# Patient Record
Sex: Female | Born: 1945 | Race: White | Hispanic: No | Marital: Married | Smoking: Former smoker
Health system: Southern US, Community
[De-identification: ages and names within clinical notes are randomized; demographics above are authoritative.]

## PROBLEM LIST (undated history)

## (undated) DIAGNOSIS — D071 Carcinoma in situ of vulva: Secondary | ICD-10-CM

## (undated) DIAGNOSIS — Z923 Personal history of irradiation: Secondary | ICD-10-CM

## (undated) DIAGNOSIS — C3410 Malignant neoplasm of upper lobe, unspecified bronchus or lung: Secondary | ICD-10-CM

## (undated) DIAGNOSIS — E039 Hypothyroidism, unspecified: Secondary | ICD-10-CM

## (undated) DIAGNOSIS — G629 Polyneuropathy, unspecified: Secondary | ICD-10-CM

## (undated) DIAGNOSIS — K589 Irritable bowel syndrome without diarrhea: Secondary | ICD-10-CM

## (undated) DIAGNOSIS — J301 Allergic rhinitis due to pollen: Secondary | ICD-10-CM

## (undated) DIAGNOSIS — K219 Gastro-esophageal reflux disease without esophagitis: Secondary | ICD-10-CM

## (undated) DIAGNOSIS — F419 Anxiety disorder, unspecified: Secondary | ICD-10-CM

## (undated) DIAGNOSIS — C9590 Leukemia, unspecified not having achieved remission: Secondary | ICD-10-CM

## (undated) DIAGNOSIS — R0989 Other specified symptoms and signs involving the circulatory and respiratory systems: Secondary | ICD-10-CM

## (undated) DIAGNOSIS — R569 Unspecified convulsions: Secondary | ICD-10-CM

## (undated) DIAGNOSIS — C539 Malignant neoplasm of cervix uteri, unspecified: Secondary | ICD-10-CM

## (undated) DIAGNOSIS — J40 Bronchitis, not specified as acute or chronic: Secondary | ICD-10-CM

## (undated) DIAGNOSIS — Z8719 Personal history of other diseases of the digestive system: Secondary | ICD-10-CM

## (undated) DIAGNOSIS — J449 Chronic obstructive pulmonary disease, unspecified: Secondary | ICD-10-CM

## (undated) DIAGNOSIS — J189 Pneumonia, unspecified organism: Secondary | ICD-10-CM

## (undated) DIAGNOSIS — R0602 Shortness of breath: Secondary | ICD-10-CM

## (undated) DIAGNOSIS — F40240 Claustrophobia: Secondary | ICD-10-CM

## (undated) HISTORY — DX: Personal history of irradiation: Z92.3

## (undated) HISTORY — DX: Carcinoma in situ of vulva: D07.1

## (undated) HISTORY — DX: Gastro-esophageal reflux disease without esophagitis: K21.9

## (undated) HISTORY — DX: Malignant neoplasm of cervix uteri, unspecified: C53.9

## (undated) HISTORY — PX: PORTACATH PLACEMENT: SHX2246

## (undated) HISTORY — DX: Hypothyroidism, unspecified: E03.9

## (undated) HISTORY — PX: TUBAL LIGATION: SHX77

## (undated) HISTORY — PX: COLONOSCOPY W/ BIOPSIES AND POLYPECTOMY: SHX1376

---

## 1998-09-06 ENCOUNTER — Encounter (INDEPENDENT_AMBULATORY_CARE_PROVIDER_SITE_OTHER): Payer: Self-pay

## 1998-09-06 ENCOUNTER — Other Ambulatory Visit: Admission: RE | Admit: 1998-09-06 | Discharge: 1998-09-06 | Payer: Self-pay | Admitting: Gynecology

## 1998-09-11 ENCOUNTER — Encounter: Payer: Self-pay | Admitting: Gynecology

## 1998-09-11 ENCOUNTER — Ambulatory Visit (HOSPITAL_COMMUNITY): Admission: RE | Admit: 1998-09-11 | Discharge: 1998-09-11 | Payer: Self-pay | Admitting: Gynecology

## 1998-09-12 ENCOUNTER — Ambulatory Visit: Admission: RE | Admit: 1998-09-12 | Discharge: 1998-09-12 | Payer: Self-pay | Admitting: Gynecologic Oncology

## 1998-09-15 ENCOUNTER — Encounter: Payer: Self-pay | Admitting: Gynecology

## 1998-09-15 ENCOUNTER — Ambulatory Visit (HOSPITAL_COMMUNITY): Admission: RE | Admit: 1998-09-15 | Discharge: 1998-09-15 | Payer: Self-pay | Admitting: Gynecology

## 1998-09-25 ENCOUNTER — Encounter: Admission: RE | Admit: 1998-09-25 | Discharge: 1998-12-24 | Payer: Self-pay | Admitting: Radiation Oncology

## 1998-11-14 ENCOUNTER — Inpatient Hospital Stay (HOSPITAL_COMMUNITY): Admission: RE | Admit: 1998-11-14 | Discharge: 1998-11-17 | Payer: Self-pay | Admitting: Radiation Oncology

## 1999-03-20 ENCOUNTER — Encounter (INDEPENDENT_AMBULATORY_CARE_PROVIDER_SITE_OTHER): Payer: Self-pay | Admitting: Specialist

## 1999-03-20 ENCOUNTER — Other Ambulatory Visit: Admission: RE | Admit: 1999-03-20 | Discharge: 1999-03-20 | Payer: Self-pay | Admitting: Gynecologic Oncology

## 1999-03-20 ENCOUNTER — Ambulatory Visit: Admission: RE | Admit: 1999-03-20 | Discharge: 1999-03-20 | Payer: Self-pay | Admitting: Gynecologic Oncology

## 1999-06-19 ENCOUNTER — Ambulatory Visit (HOSPITAL_COMMUNITY): Admission: RE | Admit: 1999-06-19 | Discharge: 1999-06-19 | Payer: Self-pay | Admitting: Radiation Oncology

## 1999-08-22 ENCOUNTER — Ambulatory Visit: Admission: RE | Admit: 1999-08-22 | Discharge: 1999-08-22 | Payer: Self-pay | Admitting: Gynecologic Oncology

## 1999-08-25 ENCOUNTER — Encounter: Payer: Self-pay | Admitting: Gynecologic Oncology

## 1999-08-25 ENCOUNTER — Ambulatory Visit (HOSPITAL_COMMUNITY): Admission: RE | Admit: 1999-08-25 | Discharge: 1999-08-25 | Payer: Self-pay | Admitting: Gynecologic Oncology

## 1999-10-12 ENCOUNTER — Ambulatory Visit (HOSPITAL_COMMUNITY): Admission: RE | Admit: 1999-10-12 | Discharge: 1999-10-12 | Payer: Self-pay | Admitting: Family Medicine

## 1999-10-25 ENCOUNTER — Encounter: Payer: Self-pay | Admitting: Family Medicine

## 1999-10-25 ENCOUNTER — Encounter: Admission: RE | Admit: 1999-10-25 | Discharge: 1999-10-25 | Payer: Self-pay | Admitting: Family Medicine

## 1999-10-27 DIAGNOSIS — C539 Malignant neoplasm of cervix uteri, unspecified: Secondary | ICD-10-CM

## 1999-10-27 DIAGNOSIS — Z923 Personal history of irradiation: Secondary | ICD-10-CM

## 1999-10-27 HISTORY — DX: Personal history of irradiation: Z92.3

## 1999-10-27 HISTORY — DX: Malignant neoplasm of cervix uteri, unspecified: C53.9

## 1999-11-27 ENCOUNTER — Encounter: Payer: Self-pay | Admitting: Family Medicine

## 1999-11-27 ENCOUNTER — Encounter: Admission: RE | Admit: 1999-11-27 | Discharge: 1999-11-27 | Payer: Self-pay | Admitting: Family Medicine

## 1999-12-25 ENCOUNTER — Ambulatory Visit: Admission: RE | Admit: 1999-12-25 | Discharge: 1999-12-25 | Payer: Self-pay | Admitting: Radiation Oncology

## 1999-12-25 ENCOUNTER — Encounter (INDEPENDENT_AMBULATORY_CARE_PROVIDER_SITE_OTHER): Payer: Self-pay | Admitting: *Deleted

## 2000-06-10 ENCOUNTER — Ambulatory Visit: Admission: RE | Admit: 2000-06-10 | Discharge: 2000-06-10 | Payer: Self-pay | Admitting: Gynecologic Oncology

## 2000-06-10 ENCOUNTER — Other Ambulatory Visit: Admission: RE | Admit: 2000-06-10 | Discharge: 2000-06-10 | Payer: Self-pay | Admitting: Gynecologic Oncology

## 2000-12-23 ENCOUNTER — Other Ambulatory Visit: Admission: RE | Admit: 2000-12-23 | Discharge: 2000-12-23 | Payer: Self-pay | Admitting: Gynecologic Oncology

## 2001-06-02 ENCOUNTER — Ambulatory Visit: Admission: RE | Admit: 2001-06-02 | Discharge: 2001-06-02 | Payer: Self-pay | Admitting: Gynecologic Oncology

## 2001-06-02 ENCOUNTER — Other Ambulatory Visit: Admission: RE | Admit: 2001-06-02 | Discharge: 2001-06-02 | Payer: Self-pay | Admitting: Gynecologic Oncology

## 2001-06-02 ENCOUNTER — Encounter (INDEPENDENT_AMBULATORY_CARE_PROVIDER_SITE_OTHER): Payer: Self-pay | Admitting: Specialist

## 2001-12-22 ENCOUNTER — Ambulatory Visit: Admission: RE | Admit: 2001-12-22 | Discharge: 2001-12-22 | Payer: Self-pay | Admitting: Radiation Oncology

## 2002-01-05 ENCOUNTER — Other Ambulatory Visit: Admission: RE | Admit: 2002-01-05 | Discharge: 2002-01-05 | Payer: Self-pay | Admitting: Radiation Oncology

## 2002-01-05 ENCOUNTER — Ambulatory Visit: Admission: RE | Admit: 2002-01-05 | Discharge: 2002-01-05 | Payer: Self-pay | Admitting: Radiation Oncology

## 2002-01-12 ENCOUNTER — Encounter: Payer: Self-pay | Admitting: Radiation Oncology

## 2002-01-12 ENCOUNTER — Ambulatory Visit (HOSPITAL_COMMUNITY): Admission: RE | Admit: 2002-01-12 | Discharge: 2002-01-12 | Payer: Self-pay | Admitting: Radiation Oncology

## 2002-01-13 ENCOUNTER — Encounter: Payer: Self-pay | Admitting: Radiation Oncology

## 2002-01-13 ENCOUNTER — Ambulatory Visit (HOSPITAL_COMMUNITY): Admission: RE | Admit: 2002-01-13 | Discharge: 2002-01-13 | Payer: Self-pay | Admitting: Radiation Oncology

## 2002-03-19 ENCOUNTER — Encounter: Admission: RE | Admit: 2002-03-19 | Discharge: 2002-06-17 | Payer: Self-pay

## 2002-07-20 ENCOUNTER — Other Ambulatory Visit: Admission: RE | Admit: 2002-07-20 | Discharge: 2002-07-20 | Payer: Self-pay | Admitting: Family Medicine

## 2003-01-11 ENCOUNTER — Other Ambulatory Visit: Admission: RE | Admit: 2003-01-11 | Discharge: 2003-01-11 | Payer: Self-pay | Admitting: Radiation Oncology

## 2003-01-11 ENCOUNTER — Ambulatory Visit: Admission: RE | Admit: 2003-01-11 | Discharge: 2003-01-11 | Payer: Self-pay | Admitting: Radiation Oncology

## 2003-04-29 ENCOUNTER — Encounter: Admission: RE | Admit: 2003-04-29 | Discharge: 2003-04-29 | Payer: Self-pay | Admitting: General Surgery

## 2003-05-02 ENCOUNTER — Ambulatory Visit (HOSPITAL_COMMUNITY): Admission: RE | Admit: 2003-05-02 | Discharge: 2003-05-02 | Payer: Self-pay | Admitting: General Surgery

## 2003-05-02 ENCOUNTER — Encounter (INDEPENDENT_AMBULATORY_CARE_PROVIDER_SITE_OTHER): Payer: Self-pay | Admitting: *Deleted

## 2003-05-02 ENCOUNTER — Ambulatory Visit (HOSPITAL_BASED_OUTPATIENT_CLINIC_OR_DEPARTMENT_OTHER): Admission: RE | Admit: 2003-05-02 | Discharge: 2003-05-02 | Payer: Self-pay | Admitting: General Surgery

## 2003-06-14 ENCOUNTER — Ambulatory Visit: Admission: RE | Admit: 2003-06-14 | Discharge: 2003-06-14 | Payer: Self-pay | Admitting: Gynecologic Oncology

## 2003-06-14 ENCOUNTER — Encounter (INDEPENDENT_AMBULATORY_CARE_PROVIDER_SITE_OTHER): Payer: Self-pay | Admitting: *Deleted

## 2003-06-14 ENCOUNTER — Other Ambulatory Visit: Admission: RE | Admit: 2003-06-14 | Discharge: 2003-06-14 | Payer: Self-pay | Admitting: Gynecologic Oncology

## 2003-07-07 ENCOUNTER — Encounter: Admission: RE | Admit: 2003-07-07 | Discharge: 2003-07-07 | Payer: Self-pay | Admitting: Family Medicine

## 2004-01-17 ENCOUNTER — Other Ambulatory Visit: Admission: RE | Admit: 2004-01-17 | Discharge: 2004-01-17 | Payer: Self-pay | Admitting: Radiation Oncology

## 2004-01-17 ENCOUNTER — Ambulatory Visit: Admission: RE | Admit: 2004-01-17 | Discharge: 2004-01-17 | Payer: Self-pay | Admitting: Radiation Oncology

## 2004-01-24 ENCOUNTER — Ambulatory Visit (HOSPITAL_COMMUNITY): Admission: RE | Admit: 2004-01-24 | Discharge: 2004-01-24 | Payer: Self-pay | Admitting: Gastroenterology

## 2004-01-24 ENCOUNTER — Encounter (INDEPENDENT_AMBULATORY_CARE_PROVIDER_SITE_OTHER): Payer: Self-pay | Admitting: *Deleted

## 2004-03-14 ENCOUNTER — Encounter: Admission: RE | Admit: 2004-03-14 | Discharge: 2004-04-25 | Payer: Self-pay | Admitting: Family Medicine

## 2004-04-25 DIAGNOSIS — D071 Carcinoma in situ of vulva: Secondary | ICD-10-CM

## 2004-04-25 HISTORY — PX: LASER ABLATION: SHX1947

## 2004-04-25 HISTORY — DX: Carcinoma in situ of vulva: D07.1

## 2004-05-01 ENCOUNTER — Other Ambulatory Visit: Admission: RE | Admit: 2004-05-01 | Discharge: 2004-05-01 | Payer: Self-pay | Admitting: Gynecologic Oncology

## 2004-05-01 ENCOUNTER — Ambulatory Visit: Admission: RE | Admit: 2004-05-01 | Discharge: 2004-05-01 | Payer: Self-pay | Admitting: Gynecologic Oncology

## 2004-05-01 ENCOUNTER — Encounter (INDEPENDENT_AMBULATORY_CARE_PROVIDER_SITE_OTHER): Payer: Self-pay | Admitting: Specialist

## 2004-05-01 ENCOUNTER — Encounter (INDEPENDENT_AMBULATORY_CARE_PROVIDER_SITE_OTHER): Payer: Self-pay | Admitting: *Deleted

## 2004-06-16 ENCOUNTER — Emergency Department (HOSPITAL_COMMUNITY): Admission: EM | Admit: 2004-06-16 | Discharge: 2004-06-17 | Payer: Self-pay | Admitting: Emergency Medicine

## 2004-06-19 ENCOUNTER — Ambulatory Visit (HOSPITAL_COMMUNITY): Admission: RE | Admit: 2004-06-19 | Discharge: 2004-06-19 | Payer: Self-pay | Admitting: Gynecologic Oncology

## 2004-07-10 ENCOUNTER — Ambulatory Visit: Admission: RE | Admit: 2004-07-10 | Discharge: 2004-07-10 | Payer: Self-pay | Admitting: Gynecologic Oncology

## 2004-10-12 ENCOUNTER — Encounter: Admission: RE | Admit: 2004-10-12 | Discharge: 2004-10-12 | Payer: Self-pay | Admitting: Family Medicine

## 2004-10-23 ENCOUNTER — Encounter (INDEPENDENT_AMBULATORY_CARE_PROVIDER_SITE_OTHER): Payer: Self-pay | Admitting: *Deleted

## 2004-10-23 ENCOUNTER — Ambulatory Visit: Admission: RE | Admit: 2004-10-23 | Discharge: 2004-10-23 | Payer: Self-pay | Admitting: Gynecologic Oncology

## 2004-10-23 ENCOUNTER — Other Ambulatory Visit: Admission: RE | Admit: 2004-10-23 | Discharge: 2004-10-23 | Payer: Self-pay | Admitting: Gynecologic Oncology

## 2005-03-13 ENCOUNTER — Emergency Department (HOSPITAL_COMMUNITY): Admission: EM | Admit: 2005-03-13 | Discharge: 2005-03-13 | Payer: Self-pay | Admitting: Emergency Medicine

## 2005-07-31 ENCOUNTER — Other Ambulatory Visit: Admission: RE | Admit: 2005-07-31 | Discharge: 2005-07-31 | Payer: Self-pay | Admitting: Gynecologic Oncology

## 2005-07-31 ENCOUNTER — Encounter (INDEPENDENT_AMBULATORY_CARE_PROVIDER_SITE_OTHER): Payer: Self-pay | Admitting: *Deleted

## 2005-07-31 ENCOUNTER — Ambulatory Visit: Admission: RE | Admit: 2005-07-31 | Discharge: 2005-07-31 | Payer: Self-pay | Admitting: Gynecologic Oncology

## 2006-08-13 ENCOUNTER — Ambulatory Visit: Admission: RE | Admit: 2006-08-13 | Discharge: 2006-08-13 | Payer: Self-pay | Admitting: Gynecologic Oncology

## 2006-08-13 ENCOUNTER — Other Ambulatory Visit: Admission: RE | Admit: 2006-08-13 | Discharge: 2006-08-13 | Payer: Self-pay | Admitting: Gynecologic Oncology

## 2006-08-13 ENCOUNTER — Encounter (INDEPENDENT_AMBULATORY_CARE_PROVIDER_SITE_OTHER): Payer: Self-pay | Admitting: Gynecologic Oncology

## 2007-04-06 ENCOUNTER — Encounter: Admission: RE | Admit: 2007-04-06 | Discharge: 2007-04-06 | Payer: Self-pay | Admitting: Family Medicine

## 2007-05-25 ENCOUNTER — Emergency Department (HOSPITAL_COMMUNITY): Admission: EM | Admit: 2007-05-25 | Discharge: 2007-05-25 | Payer: Self-pay | Admitting: Emergency Medicine

## 2007-07-14 ENCOUNTER — Ambulatory Visit: Admission: RE | Admit: 2007-07-14 | Discharge: 2007-07-14 | Payer: Self-pay | Admitting: Gynecologic Oncology

## 2007-07-14 ENCOUNTER — Other Ambulatory Visit: Admission: RE | Admit: 2007-07-14 | Discharge: 2007-07-14 | Payer: Self-pay | Admitting: Gynecologic Oncology

## 2007-07-14 ENCOUNTER — Encounter (INDEPENDENT_AMBULATORY_CARE_PROVIDER_SITE_OTHER): Payer: Self-pay | Admitting: Gynecologic Oncology

## 2008-06-29 ENCOUNTER — Ambulatory Visit: Admission: RE | Admit: 2008-06-29 | Discharge: 2008-06-29 | Payer: Self-pay | Admitting: Gynecologic Oncology

## 2008-06-29 ENCOUNTER — Encounter (INDEPENDENT_AMBULATORY_CARE_PROVIDER_SITE_OTHER): Payer: Self-pay | Admitting: Gynecologic Oncology

## 2008-06-29 ENCOUNTER — Other Ambulatory Visit: Admission: RE | Admit: 2008-06-29 | Discharge: 2008-06-29 | Payer: Self-pay | Admitting: Gynecologic Oncology

## 2009-08-02 ENCOUNTER — Ambulatory Visit: Admission: RE | Admit: 2009-08-02 | Discharge: 2009-08-02 | Payer: Self-pay | Admitting: Gynecologic Oncology

## 2009-08-02 ENCOUNTER — Other Ambulatory Visit: Admission: RE | Admit: 2009-08-02 | Discharge: 2009-08-02 | Payer: Self-pay | Admitting: Gynecologic Oncology

## 2010-06-03 ENCOUNTER — Emergency Department (HOSPITAL_COMMUNITY): Payer: Medicare Other

## 2010-06-03 ENCOUNTER — Emergency Department (HOSPITAL_COMMUNITY)
Admission: EM | Admit: 2010-06-03 | Discharge: 2010-06-03 | Disposition: A | Discharge status: Home / Self Care | Payer: Medicare Other | Attending: Emergency Medicine | Admitting: Emergency Medicine

## 2010-06-03 DIAGNOSIS — K219 Gastro-esophageal reflux disease without esophagitis: Secondary | ICD-10-CM | POA: Insufficient documentation

## 2010-06-03 DIAGNOSIS — E039 Hypothyroidism, unspecified: Secondary | ICD-10-CM | POA: Insufficient documentation

## 2010-06-03 DIAGNOSIS — R079 Chest pain, unspecified: Secondary | ICD-10-CM | POA: Insufficient documentation

## 2010-06-03 DIAGNOSIS — E7800 Pure hypercholesterolemia, unspecified: Secondary | ICD-10-CM | POA: Insufficient documentation

## 2010-06-03 DIAGNOSIS — J4489 Other specified chronic obstructive pulmonary disease: Secondary | ICD-10-CM | POA: Insufficient documentation

## 2010-06-03 DIAGNOSIS — J449 Chronic obstructive pulmonary disease, unspecified: Secondary | ICD-10-CM | POA: Insufficient documentation

## 2010-06-03 DIAGNOSIS — Z79899 Other long term (current) drug therapy: Secondary | ICD-10-CM | POA: Insufficient documentation

## 2010-06-03 LAB — DIFFERENTIAL
Basophils Absolute: 0 10*3/uL (ref 0.0–0.1)
Basophils Relative: 0 % (ref 0–1)
Eosinophils Absolute: 0.2 10*3/uL (ref 0.0–0.7)
Eosinophils Relative: 3 % (ref 0–5)

## 2010-06-03 LAB — BASIC METABOLIC PANEL
BUN: 10 mg/dL (ref 6–23)
Calcium: 8.8 mg/dL (ref 8.4–10.5)
GFR calc non Af Amer: >60 mL/min (ref >60)
Glucose, Bld: 92 mg/dL (ref 70–99)
Sodium: 138 mEq/L (ref 135–145)

## 2010-06-03 LAB — CBC
Platelets: 270 10*3/uL (ref 150–400)
RDW: 14.2 % (ref 11.5–15.5)
WBC: 7 10*3/uL (ref 4.0–10.5)

## 2010-06-03 LAB — POCT CARDIAC MARKERS
CKMB, poc: 10.2 ng/mL (ref 1.0–8.0)
Myoglobin, poc: 148 ng/mL (ref 12–200)

## 2010-07-10 NOTE — Consult Note (Signed)
NAME:  Dawn Mercer, Dawn Mercer             ACCOUNT NO.:  [id number]   MEDICAL RECORD NO.:  [id number]          PATIENT TYPE:  OUT   LOCATION:  GYN                          FACILITY:  [facility]   PHYSICIAN:  John T. Kyla Balzarine, M.D.    DATE OF BIRTH:  05/27/1945   DATE OF CONSULTATION:  06/29/2008  DATE OF DISCHARGE:                                 CONSULTATION   CHIEF COMPLAINT:  Followup of cervical cancer and VIN.   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  Followup evaluation for both have been  normal including last Pap smear 1 year ago.  She is up-to-date on  colonoscopy and has still some symptoms of low grade radiation enteritis  with dietary intolerance of fatty food or some roughage.  She denies  rectal bleeding, vaginal bleeding, pelvic pain, leg swelling or bladder  symptoms.   PAST MEDICAL HISTORY:  1. Hypothyroidism.  2. GERD.  3. Degenerative disk disease.  4. Osteopenia.  5. GYN as above.   MEDICATIONS:  Nexium, Synthroid, Ambien, tramadol and Fosamax.   ALLERGIES:  None known.   PERSONAL AND SOCIAL HISTORY:  Reviewed and unchanged from my intake  evaluation in 2001.   FAMILY HISTORY:  Noncontributory.   REVIEW OF SYSTEMS:  Otherwise 10-point review negative.   EXAMINATION:  VITAL SIGNS:  Stable and afebrile with weight stable at  200 pounds.  The patient is anxious, alert and oriented x3 in no acute  distress.  LYMPH SURVEY:  Negative for pathologic lymphadenopathy.  BACK:  No spinous or CVA tenderness.  ABDOMEN:  Obese, soft and benign with no hernia, ascites, mass or  tenderness.  EXTREMITIES:  Full strength and range of motion without edema, cords or  Homan's.  PELVIC:  External genitalia BUS.  Bladder and  urethra normal.  Vagina is  clear and coapted at the apex.  No palpable lesions.  Bimanual and  rectovaginal examinations reveal no cervical expansion.  The uterus is  poorly defined because of habitus.  There are no masses, nodularity or  tenderness.   ASSESSMENT:  Cervical carcinoma, no evidence of disease.  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   PLAN:  Cytology repeated and will be communicated to the patient.  She  will continue annual followup.  The importance of mammographic screening  was again emphasized.      John T. Kyla Balzarine, M.D.  Electronically Signed     JTS/MEDQ  D:  06/29/2008  T:  06/29/2008  Job:  [id number]   cc:   Telford Nab, R.N.  [address]  [place], [place] [address]   Billie Lade, M.D.  Fax: [phone]   Chales Salmon. Abigail Miyamoto, M.D.  Fax: [phone]

## 2010-07-10 NOTE — Consult Note (Signed)
NAME:  Dawn Mercer, Dawn Mercer             ACCOUNT NO.:  [id number]   MEDICAL RECORD NO.:  [id number]          PATIENT TYPE:  OUT   LOCATION:  GYN                          FACILITY:  [facility]   PHYSICIAN:  John T. Kyla Balzarine, M.D.    DATE OF BIRTH:  May 25, 1945   DATE OF CONSULTATION:  08/13/2006  DATE OF DISCHARGE:                                 CONSULTATION   CHIEF COMPLAINT:  Follow-up of VIN-3 and cervical cancer.   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  She had no evidence of  recurrent disease.  In March 2006, she had VIN-3, undergoing extensive  laser ablation.  She was treated with Aldara.  Follow-up has been  negative including evaluation by Dr. Roselind Messier.  The patient has done quite  well but does have symptoms of radiation enteritis including dietary  intolerance of roughage.  She is currently on ProctoFoam for rectal  bleeding.  She denies bladder symptoms, vaginal bleeding or leg  swelling.   PAST MEDICAL HISTORY:  1. Hypothyroidism.  2. GERD.  3. Degenerative disk disease.  4. Osteopenia.   MEDICATIONS:  1. Nexium.  2. Synthroid.  3. Ambien.  4. Tramadol.  5. Fosamax.   ALLERGIES:  None known.   PERSONAL SOCIAL HISTORY AND FAMILY HISTORY:  Are reviewed and unchanged  from those obtained at intake in 2001.   REVIEW OF SYSTEMS:  Otherwise, 10-point review negative.   PHYSICAL EXAMINATION:  Weight 199 pounds.  The patient is alert and  oriented x3 in no acute distress.  LYMPH SURVEY:  Negative for pathologic lymphadenopathy.  The abdomen is obese, soft and benign with no ascites, mass or  tenderness.  EXTREMITIES:  Have full strength and range of motion without edema,  clubbing or cyanosis.  PELVIC:  External genitalia, bladder, urethra and vagina are all normal.  Bimanual and rectovaginal examinations disclose small hemorrhoids but no  palpable abnormalities.   ASSESSMENT:  1. Cervical cancer history and VIN post laser vaporization.  2. Low grade radiation enteritis.   PLAN:  The patient was given a prescription for Questran, and I  discussed its use.  The patient can be followed annually, and she will  be appraised of today's Pap smear.      John T. Kyla Balzarine, M.D.  Electronically Signed     JTS/MEDQ  D:  08/13/2006  T:  08/14/2006  Job:  [id number]   cc:   Chales Salmon. Abigail Miyamoto, M.D.  Fax: [phone]   Billie Lade, M.D.  Fax: [phone]   Telford Nab, R.N.  [address]  [place], [place] [address]

## 2010-07-10 NOTE — Consult Note (Signed)
NAME:  Dawn Mercer, Dawn Mercer             ACCOUNT NO.:  [id number]   MEDICAL RECORD NO.:  [id number]         PATIENT TYPE:  LOUT   LOCATION:                               FACILITY:  [facility]   PHYSICIAN:  John T. Kyla Balzarine, M.D.    DATE OF BIRTH:  1945/07/25   DATE OF CONSULTATION:  DATE OF DISCHARGE:                                 CONSULTATION   CHIEF COMPLAINT:  This patient is a 65 year old woman who returns for  followup of VIN III and cervical cancer.   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  She had no evidence of  recurrent disease.  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  Follow-up evaluation has been  normal including my last Pap smear 1 year ago.  She underwent  colonoscopy since she was last seen with removal of a precancerous  polyp.  She has had no further rectal bleeding and denies bladder  symptoms, vaginal bleeding or leg swelling.   PAST MEDICAL HISTORY:  1. Hypothyroidism.  2. GERD.  3. Degenerative disk disease.  4. Osteopenia.   MEDICATIONS:  Nexium, Synthroid, Ambien, tramadol and Fosamax.   ALLERGIES:  None known.   PERSONAL AND SOCIAL HISTORY:  Reviewed and unchanged from those obtained  during intake in 2001.   REVIEW OF SYSTEMS:  Otherwise 10-point review negative.   EXAM:  Weight stable and vital signs stable.  The patient is obese, in  no acute distress.  LYMPH SURVEY:  Negative for pathologic lymphadenopathy.  BACK:  No CVA tenderness.  ABDOMEN:  Soft and benign, with no ascites, mass or organomegaly.  No  abdominal tenderness.  EXTREMITIES:  With full strength and range of motion.  No edema, cords  or Homans'.  PELVIC:  External genitalia:  BUS are normal to inspection and  palpation.  Bladder and urethra are normal.  Speculum examination  reveals radiation change to the  vaginal apex.  The cervix is nonpalpable  and uterus is not enlarged.  There are no masses or tenderness.   ASSESSMENT:  Cervical carcinoma, no evidence of disease, and vulvar  dysplasia.   PLAN:  The patient is reassured regarding her current status.  She  should have follow-up cytology at annual intervals and for each visit a  Pap smear will be obtained.      John T. Kyla Balzarine, M.D.  Electronically Signed     JTS/MEDQ  D:  07/14/2007  T:  07/14/2007  Job:  [id number]

## 2010-07-13 NOTE — Op Note (Signed)
NAME:  Dawn Mercer, Dawn Mercer                       ACCOUNT NO.:  [id number]   MEDICAL RECORD NO.:  [id number]                   PATIENT TYPE:  AMB   LOCATION:  DSC                                  FACILITY:  [facility]   PHYSICIAN:  Gabrielle Dare. Janee Morn, M.D.             DATE OF BIRTH:  21-Apr-1945   DATE OF PROCEDURE:  DATE OF DISCHARGE:                                 OPERATIVE REPORT   PREOPERATIVE DIAGNOSIS:  Anal and perianal condylomata.   POSTOPERATIVE DIAGNOSIS:  Anal and perianal condylomata.   PROCEDURE:  Excision anal and perianal condylomata.   SURGEON:  Gabrielle Dare. Janee Morn, M.D.   ANESTHESIA:  General with laryngeal mask airway.   INDICATIONS:  The patient is a 65 year old white female who I evaluated in  my office for anal and perianal wart, and she presents for elective  excision.  She does have a history of some cervical cancer in the past which  I suspect may have some association.   DESCRIPTION OF PROCEDURE:  Informed consent was obtained.  The patient  received preoperative antibiotics.  General anesthesia was begun.  Laryngeal  mask airway was administered.  She was placed in the lithotomy position.  Her perianal area and lower buttocks were prepped and draped in a sterile  fashion.  First, 0.5% Marcaine with epinephrine was injected in the affected  area.  Digital rectal exam revealed similar findings to my office  examination with several small to moderate size condylomata involving her  anal area.  No other intrarectal abnormalities were noted.  In addition, she  had two approximately 1 cm size conglomerations of warts, one on the left  lateral skin outside her anal opening and then one on the right side of  similar size.  These areas were also anesthetized. Initially using the  scalpel, the anal and intra-anal lesions were excised.  Hemostasis was  obtained with Bovie cautery using the smoke vacuum from each and these were  sent to pathology,  Subsequently, elliptical  incision was made around the  left lateral lesion, and this was sent in toto as a separate specimen.  Hemostasis at that site was obtained with Bovie cautery.  Finally, the right  lateral lesion was excised with Bovie.  This was also sent to pathology,  labeled separately, and this area was cauterized to get excellent  hemostasis.  Subsequently, all of the wounds were copiously irrigated.  They  were rechecked for hemostasis.  This was then secured with the Bovie cautery  and the skin of each closed with interrupted 4-0 Vicryl subcuticular suture.  Benzoin and Steri-Strips were also applied to the right lateral and left  lateral lesions.  The areas were all rechecked of hemostasis which was  intact and sterile dressing was applied.  Sponge, needle and instrument  counts were all correct.  The patient tolerated the procedure well and was  taken to the recovery room in stable condition.  Gabrielle Dare Janee Morn, M.D.    BET/MEDQ  D:  05/02/2003  T:  05/02/2003  Job:  [id number]

## 2010-07-13 NOTE — Consult Note (Signed)
NAME:  Dawn Mercer, Dawn Mercer             ACCOUNT NO.:  [id number]   MEDICAL RECORD NO.:  [id number]          PATIENT TYPE:  OUT   LOCATION:  GYN                          FACILITY:  [facility]   PHYSICIAN:  Paola A. Duard Brady, MD    DATE OF BIRTH:  [birth year]   DATE OF CONSULTATION:  DATE OF DISCHARGE:                                   CONSULTATION   Dawn Mercer is a 65 year old who was diagnosed with IB adenosquamous  carcinoma of the cervix and underwent radiation therapy, which she completed  in September, 2001.  She has been followed without evidence of significant  recurrent disease since that time.  She had a Pap smear in November, 2005,  which revealed atypical squamous cells of undetermined significance.  It was  felt that the atypia was similar to that she had in her prior Pap smear, and  she was dispositioned to Premarin cream 1 gm twice weekly.  She comes in  today for a follow-up Pap smear.  She was last seen by Dr. Roselind Messier in  November, 2005, at which time this Pap smear was performed.  She otherwise  denies any significant complaints.  She has a bone spurring.  She also has  plantar fascitis of her right foot.  She has gained some weight, but she  attributes it to inactivity from her right foot discomfort as well as some  back problems.  She does have hypothyroidism, but per her report, per Dr.  Abigail Miyamoto, her thyroid functions are normal.  She denies any vaginal bleeding  or any change in her bowel or bladder habits.   MEDICATIONS:  Premarin cream, Synthroid, Nexium, Fosamax, transitioning to  Boniva and Tramadol.   PHYSICAL EXAMINATION:  VITAL SIGNS:  Weight 202 pounds.  Blood pressure  127/70.  GENERAL:  A well-developed and well-nourished female in no acute distress.  PELVIC:  External genitalia is notable for an enlarged labia minora on her  left side.  There is hyperkeratosis and discoloration of the left labia  minora.  The patient was shown this area with the mirror.  The  vaginal is  markedly atrophic.  The vaginal cuff is visualized.  There are no gross  visible lesions.  A ThinPrep Pap is submitted without difficulty.   After obtaining the patient's verbal consent, 1% lidocaine 0.5 cc was  injected into the left labia minora.  A biopsy was performed using Tischler  biopsy forceps.  Hemostasis was obtained using silver nitrate.  Bimanual  examination reveals no masses or nodularity.  Rectal was deferred, as she  recently had a rectal examination three months ago.   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   PLAN:  We will follow up the results of her Pap smear and her vulvar biopsy  from today.  After I noticed the lesion and commented about  it, she does  state that for several months, she has had some vulvar pruritus.  She felt  that she  had scratched herself.  We will follow up on these results, and she will  return to see [place] in three months.  We will need to determine her disposition  regarding the vulvar biopsy.  Once the pathology is available, we will  contact her with the results.      PAG/MEDQ  D:  05/01/2004  T:  05/01/2004  Job:  [id number]   cc:   Jonny Ruiz T. Kyla Balzarine, M.D.   Billie Lade, M.D.  [address] [facility]  [place]  [place] [address]  Fax: [phone]   Chales Salmon. Abigail Miyamoto, M.D.  [address]  [place]  [place] [address]  Fax: [phone]   Telford Nab, R.N.  [phone]  [place], [place] [address]

## 2010-07-13 NOTE — Consult Note (Signed)
NAME:  Dawn Mercer, Dawn Mercer NO.:  [id number]   MEDICAL RECORD NO.:  [id number]                   PATIENT TYPE:  REC   LOCATION:  [facility]                                  FACILITY:  [facility]   PHYSICIAN:  Zachary George, DO                      DATE OF BIRTH:  Mar 16, 1945   DATE OF CONSULTATION:  03/30/2002  DATE OF DISCHARGE:                                   CONSULTATION   REASON FOR CONSULTATION:  Thank you very much for kindly referring the  [facility] to [facility] for evaluation.  The patient was seen in our clinic today.  Please refer to the following for  details regarding the history, physical examination, and treatment plan.  Once again, thank you for allowing [place] to participate in the care of the  patient.   CHIEF COMPLAINT:  Lower back, right foot, and leg pain.   HISTORY OF PRESENT ILLNESS:  The patient is a pleasant 65 year old right-  hand dominant female who was kindly referred by Dr. Chales Salmon. Thacker for  evaluation of low back pain.  The patient complains of a several year  history of low back pain radiating across her beltline to her buttocks  bilaterally, and into her right anterior thigh into her knee.  She states  the pain intermittently involves her anterior foot and toes lasting  approximately five minutes causing her to have to sit down.  She denies any  numbness or paresthesias in her lower extremities.  Her function and quality  of life indices have declined and she states that I cannot do my housework  like I used to.  Her sleep is poor.   She has a history of cervical cancer and is status post radiation therapy x  27 treatments and radioactive implants.  She is currently followed by Dr.  Billie Lade.  Because of her past history of cancer, she has undergone  bone scan, which according to Dr. Chales Salmon. Thacker's note was negative.  She also had a MRI of her lumbar spine which revealed some  degenerative disc  changes at L4/5 and L5-S1 with lumbar facet arthropathy at L4/5 and L5-S1  bilaterally.  She had a CT scan of her abdomen and pelvis which was negative  for any metastases.   The patient's current pain level is 4/10 on a subjective scale and described  as constant, dull with associated feeling of weakness in the right lower  extremity.  Her symptoms are worse with walking, bending, sitting, and  improved with frequent position changes.  She has not had any physical  therapy but has been treated primarily with medications including aspirin,  Tylenol, Aleve, Advil, and Celebrex without any relief.  She has also been  prescribed OxyContin which she states she flushed down the toilet secondary  to poor tolerance to the medication.  She is not currently taking any pain  medication.   I reviewed the health and history form, 14-point review of systems.  The  patient denies bowel and bladder dysfunction.  The patient denies fevers,  chills, night sweats, or weight loss.  She admits to occasional shortness of  breath, leg pain with walking, ankle swelling, sinus trouble, thyroid  trouble, history of seizures.   PAST MEDICAL HISTORY:  1. Cervical cancer.  2. Hypothyroidism.  3. Gastroesophageal reflux disease.  4. Chronic obstructive pulmonary disease.  5. Seizure disorder.   PAST SURGICAL HISTORY:  Radiation implants for cervical cancer.   FAMILY HISTORY:  Heart disease, cancer, diabetes.   SOCIAL HISTORY:  The patient denies smoking, alcohol, or illicit drug use.  She is married and is not currently working.  She does not feel like she is  disabled but states that she cannot hold down a job requiring her to sit or  stand for any prolonged period of time.   ALLERGIES:  No known drug allergies.   MEDICATIONS:  Nexium, Synthroid, Fosamax.   PHYSICAL EXAMINATION:  GENERAL:  A healthy-appearing female in no acute  distress.  VITAL SIGNS:  Blood pressure is 141/51,  pulse 67, respirations 18.  O2  saturation is 94% on room air.  NEUROLOGICAL:  Mood and affect are appropriate.  The patient is alert and  oriented.  BACK:  Examination of the back reveals level pelvis without scoliosis.  There is normal lumbar lordosis.  There is significant tenderness to  palpation bilateral lower lumbar paraspinal muscles bilaterally.  Range of  motion of the lumbar spine is full in all planes with pain on extension,  rotation, as well extension equally.  There is minimal pain, if any, with  flexion.  Manual muscle testing is 5/5 bilateral lower extremities.  Sensory  examination is intact to light touch bilateral lower extremities.  Muscle  stretch reflexes are 2+/4 bilateral patella, medial hamstrings, and  Achilles.  Straight leg raise is negative bilaterally.  Pearlean Brownie is negative  bilaterally.  The patient has tight hamstrings and hip flexors bilaterally.  There is no abnormal tone noted in the lower extremities.  No ankle clonus  noted bilaterally.  No heat, erythema, or edema in the lower extremities.   LABORATORY DATA:  MRI of the lumbar spine dated January 13, 2002 reveals  mild disc bulge at L4/5 with bilateral facet joint degenerative changes.  There are also mild bilateral facet joint degenerative changes at L5-S1 with  minimal anterior slippage and mild diffuse pseudodisc bulge.   IMPRESSION:  1. Chronic low back pain with right lower extremity radicular symptoms.  2. Lumbar facet arthropathy.  I suspect that this is contributing     significantly to the patient's current pain symptoms given her physical     exam findings.  3. Degenerative disc disease in the lumbar spine, mild, question clinical     significant.  4. History of cervical cancer.  Based on her imaging studies, there is no     evidence to support metastatic disease at this time.   PLAN:  1. This was an extensive consultation with the patient of 45 minutes    duration.  We spent  considerable time discussing the etiology of her pain     and the treatment options.  We discussed medication management therapy     and minimally invasive interventional procedures to help control her     pain.  Initially, I would like to get her  started in physical therapy for     range of motion stretching, lumbar stabilization exercises, and leading     to a home exercise program two to three times a week for four weeks.  2. In terms of medications, we will start Lidoderm 5% applied up to 12 hours     per day, maximum three patches a time #30 with one refill.  I have asked     her to give this a two week trial before judging efficacy.  If symptoms     are not improving with the Lidoderm, we will consider adding a different     nonsteroidal anti-inflammatory medication such as Bextra, Vioxx, or     Mobic.  3. We will consider facet joint injections bilateral L4/5 and L5-S1     diagnostically and therapeutically.  I discussed this with the patient     including the potential risk, benefits, limitations, and alternatives.     Would like to give physical therapy a try prior to any injections and the     patient agrees.  4. The patient is to return to the clinic in one month for reevaluation.    DISPOSITION:  The patient was educated in the above findings,  recommendations, and understands.  There were no barriers to communication.                                               Zachary George, DO    JW/MEDQ  D:  03/30/2002  T:  03/30/2002  Job:  [id number]   cc:   Chales Salmon. Abigail Miyamoto, M.D.  [address]  [place]  [place] [address]  Fax: [phone]

## 2010-07-13 NOTE — Consult Note (Signed)
[facility]  Patient:    Dawn Mercer, Dawn Mercer Visit Number: [id number] MRN: [id number]          Service Type: GON Location: GYN Attending Physician:  Sabino Donovan Dictated by:   Jackquline Denmark. Kyla Balzarine, M.D. Proc. Date: 06/02/01 Admit Date:  06/02/2001   CC:         Billie Lade, M.D.  Gretta Cool, M.D.  Telford Nab, R.N.   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  INTERVAL HISTORY:  The patient has continued to have progressive bilateral hip pain and will be evaluated by an orthopedist in the near future.  Celebrex did not improve her pain.  She denies CVA tenderness, pelvic pain, or vaginal bleeding.  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  She has since been followed without evidence of recurrent disease.  PAST MEDICAL HISTORY:  Significant for seizure at age 26, hyperthyroidism, and osteoporosis.  MEDICATIONS:  Fosamax and Xanax p.r.n.  ALLERGIES:  CODEINE.  SOCIAL HISTORY:  The patient is married with a 40+-pack-year history of tobacco abuse.  Denies ethanol.  FAMILY HISTORY:  Noncontributory.  REVIEW OF SYSTEMS:  As noted above.  Patients hip pain is severe enough that she uses a cane for ambulation.  PHYSICAL EXAMINATION  VITAL SIGNS:  Stable, afebrile.  Weight 188 pounds.  GENERAL:  The patient is alert and oriented x3, in no acute distress.  NECK:  Supple without goiter.  There is no pathologic lymphadenopathy.  BACK:  No back or CVA tenderness.  LUNGS:  Lung fields are clear.  ABDOMEN:  Obese, soft, benign without ascites, mass, or organomegaly.  EXTREMITIES:  Full range of motion and strength with no significant lymphedema or cellulitis changes.  PELVIC:  External genitalia and BUS  are normal to inspection and palpation. There is good support of the bladder and urethra.  Vagina has radiation change in the upper vault with coaptation.  Bimanual and rectovaginal examinations reveal small, round cervix and uterus without parametrial thickening or nodularity.  ASSESSMENT:  Cervical carcinoma, clinically NED.  PLAN:  Pap smear obtained.  The patient is reassured regarding her current status.  She will see Dr. Roselind Messier for follow-up in six months and return to see [place] in one year.  I definitely recommended she see an orthopedist for her current hip pain. Dictated by:   Jackquline Denmark. Kyla Balzarine, M.D. Attending Physician:  Ronita Hipps T DD:  06/02/01 TD:  06/03/01 Job: [id number]

## 2010-07-13 NOTE — H&P (Signed)
[facility]  Patient:    Dawn Mercer, Dawn Mercer                    MRN: [id number] Adm. Date:  [id number] Attending:  Ronita Hipps T CC:         Telford Nab, N.P. - GYN Oncology Unit - [facility]             Billie Lade, M.D.             Gretta Cool, M.D.                         History and Physical  CHIEF COMPLAINT:  This 65 year old Caucasian woman who returns for ongoing followup is stage IB2 carcinoma of the cervix with adenosquamous histology.  INTERVAL NOTE:  The patient has noted occasional leg swelling, right greater than left, which decreases at the end of the day after she has elevated her legs, but reaccumulates beginning midday.  She has had bilateral hip pain.  Her leg edema  responded to transient use of "fluid pills."  She has normal and bladder symptoms. She discontinue Evista because of forgetfulness and wishes a discussion of hormonal replacement.  HISTORY OF PRESENT ILLNESS:  The patient had stage IB2 adenosquamous carcinoma f the cervix, treated with external beam radiation, followed by an implant with concurrent cisplatin chemotherapy.  She has been followed, without evidence of recurrent disease.  PAST MEDICAL HISTORY:  Significant for a seizure at age 61, treated with Dilantin through the mid-1990s.  No chronic comorbidities.  CURRENT MEDICATIONS:  Xanax 0.5 mg p.r.n.  ALLERGIES:  CODEINE.  PERSONAL/SOCIAL HISTORY:  The patient is married and has a 40+-pack-year-history of tobacco use, but discontinued smoking recently.  There is no history of ethanol use.  FAMILY HISTORY:  Noncontributory.  REVIEW OF SYSTEMS:  As noted above.  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  Leg swelling and bilateral hip pain as above.  At times, the hip pain is severe enough that she uses a cane for ambulation.  She has gained weight since smoking cessation.  PHYSICAL  EXAMINATION:  VITAL SIGNS:  Weight 182 pounds (increased), blood pressure 120/78.  Vital stable and afebrile.  GENERAL:  The patient is alert and oriented x 3, in no acute distress.  HEENT:  Benign and clear oropharynx.  NECK:  Supple without goiter.  There is no pathologic lymphadenopathy.  BACK:  There is no back or CVA tenderness.  LUNGS:  Fields are clear.  ABDOMEN:  Obese, soft, benign, without ascites, mass, or organomegaly.  EXTREMITIES:  Full range of motion with trace right pretibial edema.  No edema n the left.  There are no skin lesions.  GENITOURINARY/PELVIC/RECTAL:  External genitalis and BUS are normal to inspection and palpation.  There is good support of bladder and urethra.  The vagina has radiation change in the upper vault with coaptation, and the cervix is not visualized.  Bimanual and rectovaginal examinations reveal a small cervix and uterus without parametrial thickening or nodularity.  ASSESSMENT: 1. Cervical carcinoma, clinically no evidence of disease. 2. Bilateral hip pain. 3. Lymphedema.  PLAN:  The patient is given prescriptions for Estraderm 0.5 mcg patch b.i.w., and Aldactazide 25 mg to be used on a p.r.n. basis.  We will set up a CT scan of the abdomen and pelvis, and if negative, we can spread out her followup after she sees Dr. Fayrene Fearing  D. Kinard in six months.  I will plan to see her in approximately nine months, and would alternate followup with Dr. Roselind Messier at six-month intervals. DD:  08/22/99 TD:  08/22/99 Job: [id number]

## 2010-07-13 NOTE — Consult Note (Signed)
NAME:  Dawn Mercer, Dawn Mercer             ACCOUNT NO.:  [id number]   MEDICAL RECORD NO.:  [id number]          PATIENT TYPE:  OUT   LOCATION:  GYN                          FACILITY:  [facility]   PHYSICIAN:  Paola A. Duard Brady, MD    DATE OF BIRTH:  08-24-1945   DATE OF CONSULTATION:  10/23/2004  DATE OF DISCHARGE:                                   CONSULTATION   DATE OF CONSULTATION:  October 23, 2004.   Dawn Mercer is a very pleasant 65 year old who was diagnosed with stage IB  adenosquamous carcinoma of the cervix and underwent radiation therapy which  she completed in September of 2001.  She has been followed expectantly since  that time without evidence of recurrent disease.  I saw her in March of  2006.  She had a vulvar biopsy which revealed VIN-3.  Pap smear at that time  was unremarkable.  She subsequently underwent laser ablation of the vulva in  April of 2006.  She comes in today for followup.  She is overall doing quite  well.   REVIEW OF SYSTEMS:  She denies any vaginal bleeding, change in bowel or  bladder habits or vulvar pruritus.  She is having increasing discomfort  which she describes as a sliding hiatal hernia, and she will be following up  with her primary physician.   PHYSICAL EXAMINATION:  GENERAL APPEARANCE:  A well-nourished, well-developed  female in no acute distress.  Weight 203 pounds, which is stable.  PELVIC:  External genitalia show a small, approximately 3-x-3-mm  hyperkeratotic, raised area on the edge of her labia minora.  The remainder  of the vulva is without visible lesions.  The vagina is markedly atrophic.  The vaginal cuff is visualized.  There is imposed radiation telangiectases.  The vagina is markedly atrophic.  Thin-Prep Pap was submitted without  difficulty.  Bimanual examination reveals no palpable masses or nodularity.   ASSESSMENT:  A 65 year old with a history of VIN-3 (vulvar intraepithelial  neoplasia) who also has a history of stage IB  adenosquamous carcinoma of the  cervix.   PLAN:  We will follow up the results of the Pap smear from today.  1.  She was shown the lesion today with a mirror.  She will apply Aldara to      the affected area 3 times per week for the next 12 weeks.  She will      follow up with me in 6 months.  She knows to come back should she have      any increasing pruritus.  The side effects of Aldara, vulvar pruritus      and irritation, were discussed with the patient.  Precautions and      indications to call us were given.      Paola A. Duard Brady, MD  Electronically Signed    PAG/MEDQ  D:  10/23/2004  T:  10/23/2004  Job:  [id number]   cc:   Dawn Mercer, R.N.  [address]  [place], [place] [address]

## 2010-07-13 NOTE — Consult Note (Signed)
NAME:  Dawn Mercer, Dawn Mercer             ACCOUNT NO.:  [id number]   MEDICAL RECORD NO.:  [id number]          PATIENT TYPE:  OUT   LOCATION:  GYN                          FACILITY:  [facility]   PHYSICIAN:  John T. Kyla Balzarine, M.D.    DATE OF BIRTH:  22-Jun-1945   DATE OF CONSULTATION:  DATE OF DISCHARGE:                                   CONSULTATION   CHIEF COMPLAINT:  Follow-up of VIN III and cervical cancer.   HISTORY OF PRESENT ILLNESS:  The patient was treated with radical  radiotherapy for stage I-B adenosquamous carcinoma of cervix, completing  treatment in September 2001, and has had no evidence of recurrent disease.  In March 2006, she had VIN III, undergoing extensive laser ablation in April  2006.  She was subsequently treated with Aldara and did not return for  follow-up here.  Follow up evaluation by Dr. Roselind Messier in November 2006 was  negative.  In general, the patient is doing quite well with minimal symptoms  of radiation enteritis.  She has not been using vaginal estrogen and relates  vaginal dryness.  She denies bowel symptoms, vaginal bleeding or leg  swelling.   PAST HISTORY:  Significant for hypothyroidism, GERD, degenerative disk  disease, and osteopenia.   MEDICATIONS:  Nexium, Synthroid, Ambien, tramadol and Fosamax.   ALLERGIES:  None known.   Personal social history and family history are reviewed and unchanged from  those obtained at intake in 2001.   REVIEW OF SYSTEMS:  Otherwise, 10-point review of systems negative.   PHYSICAL EXAMINATION:  VITAL SIGNS:  Weight 200 pounds, blood pressure 132/82 and vital signs  stable.  GENERAL:  The patient is alert and oriented x3 in no acute distress.  LYMPH NODES:  There is no pathologic lymphadenopathy on lymph node survey.  ABDOMEN:  The abdomen is obese, soft and benign with no hernia, ascites,  mass or tenderness.  BACK:  There is no back or CVA tenderness.  PELVIC:  External genitalia and BUS are normal.  The vagina is  modestly  atrophic with telangiectasia at the apex.  No mucosal lesions.  Bimanual and  rectovaginal examinations disclose coapted vagina with very small cervix.  The uterus is nonpalpable.  No adnexal or parametrium mass/nodularity.   ASSESSMENT:  Cervical carcinoma, NAD.  VIN post laser vaporization.   PLAN:  At this juncture, the patient can be followed annually but we would  be glad to see her on a p.r.n. basis if she develops vulvar lesions.  Refills for vaginal Premarin are given.      John T. Kyla Balzarine, M.D.  Electronically Signed     JTS/MEDQ  D:  07/31/2005  T:  07/31/2005  Job:  [id number]   cc:   Chales Salmon. Abigail Miyamoto, M.D.  Fax: [phone]   Billie Lade, M.D.  Fax: [phone]   Telford Nab, R.N.  [address]  [place], [place] [address]

## 2010-07-13 NOTE — Op Note (Signed)
NAME:  Dawn Mercer, Dawn Mercer             ACCOUNT NO.:  [id number]   MEDICAL RECORD NO.:  [id number]          PATIENT TYPE:  AMB   LOCATION:  DAY                          [facility]ACILITY:  [facility]   PHYSICIAN:  Paola A. Duard Brady, MD    DATE OF BIRTH:  [birth year]   DATE OF PROCEDURE:  06/19/2004  DATE OF DISCHARGE:                                 OPERATIVE REPORT   PREOPERATIVE DIAGNOSIS:  Vulvar intraepithelial neoplasia 3.   POSTOPERATIVE DIAGNOSIS:  Vulvar intraepithelial neoplasia 3.   PROCEDURE:  Laser ablation of vulvar dysplasia.   SURGEON:  Dr. Duard Brady   ANESTHESIA:  MAC with 4 mL of 1% plain lidocaine.   SPECIMENS:  None.   ESTIMATED BLOOD LOSS:  Not applicable.   IV FLUIDS:  550 mL.   URINE OUTPUT:  Not recorded.   FINDINGS:  An acetowhite epithelial area that replaced the entire left labia  minora.  The remainder of the vulva after application of acetic acid  revealed no visible lesions.  Prior biopsies done in the clinic were  consistent with VIN 3.   The patient was taken to the operating room, placed in supine position,  where conscious sedation was induced.  She was then placed in the  dorsolithotomy position to her comfort.  Time out was performed.  The  perineum was prepped in the usual sterile fashion, and drapes were placed  for leggings, the under-buttocks, and an abdominal drape.  Moist towels were  then placed around the perineum.  The findings as above were noted after  application of acetic acid to the entire vulva.  Four mL of 1% plain  lidocaine were used for anesthetic as well as to create the  appropriate plane for the laser ablation.  Using a CO2 laser at 5 watts  continuous, the area was ablated without difficulty.  Hemostasis was  obtained using the laser.  Silvadene was then applied.  The patient  tolerated the procedure well and was taken to the recovery room in stable  condition.  All counts were correct.      PAG/MEDQ  D:  06/19/2004  T:  06/19/2004   Job:  [id number]   cc:   Telford Nab, R.N.  [address]  [place], [place] [address]   Billie Lade, M.D.  [address] - [facility]  [place]  [place] [address]  Fax: [phone]   Chales Salmon. Abigail Miyamoto, M.D.  [address]  [place]  [place] [address]  Fax: [phone]

## 2010-07-13 NOTE — Consult Note (Signed)
NAME:  Dawn Mercer, Dawn Mercer                       ACCOUNT NO.:  [id number]   MEDICAL RECORD NO.:  [id number]                   PATIENT TYPE:  OUT   LOCATION:  GYN                                  FACILITY:  [facility]   PHYSICIAN:  John T. Kyla Balzarine, M.D.                 DATE OF BIRTH:  [birth year]   DATE OF CONSULTATION:  DATE OF DISCHARGE:                                   CONSULTATION   REASON FOR CONSULTATION:  Ongoing followup of stage IB adenosquamous  carcinoma of the cervix.   INTERVAL HISTORY:  The patient denies gynecologic symptoms including pelvic  pain or vaginal bleeding. She is being followed by an orthopedist for hip  pain for hip pain.   HISTORY OF PRESENT ILLNESS:  The patient was treated for stage IB  adenosquamous carcinoma of the cervix with radical radiotherapy, completing  treatment in September 2001. She has been followed without evidence of  recurrence disease since.   PAST MEDICAL HISTORY:  Seizure at age 62, hyperthyroidism, and osteoporosis.   MEDICATIONS:  Fosamax and Xanax p.r.n.   ALLERGIES:  CODEINE.   SOCIAL HISTORY:  Married, 40-pack year history of tobacco use, but denies  ethanol.   FAMILY HISTORY:  Noncontributory.   REVIEW OF SYSTEMS:  As above; hip pain severe enough to use a cane for  ambulation.   PHYSICAL EXAMINATION:  VITAL SIGNS: Stable and afebrile with blood pressure  of 110/64, weight 193 pounds.  GENERAL: The patient is alert and oriented times three, in no acute  distress.  NECK: Supple without goiter.  LYMPH NODES:  No pathologic lymphadenopathy.  BACK: No back or CVA tenderness.  ABDOMEN: Obese, soft, and benign without ascites, mass, or organomegaly.  EXTREMITIES: Full range of motion and strength with no lymphedema or  cellulitis.  PELVIC EXAM: External genitalia and BUS are normal to inspection and  palpation. There is good support of bladder and urethra. Vagina has  radiation atrophy with coaptation of the upper vault. The  cervix is unable  to visualized. Bimanual and rectovaginal examinations reveal a small cervix  and uterus without parametrial thickening or nodularity.   ASSESSMENT:  Cervical carcinoma, NED.   PLAN:  Pap smear obtained. The patient can be followed at annual intervals  with genital cytology.                                               John T. Kyla Balzarine, M.D.    JTS/MEDQ  D:  06/14/2003  T:  06/14/2003  Job:  [id number]   cc:   Billie Lade, M.D.  [address] - [facility]  [place]  [place] [address]  Fax: [phone]   Gretta Cool, M.D.  [address] [place]  [place] [address]  Fax: [phone]  Telford Nab, R.N.  [address]  [place], [place] [address]

## 2010-07-13 NOTE — Op Note (Signed)
NAME:  Dawn Mercer, Dawn Mercer             ACCOUNT NO.:  [id number]   MEDICAL RECORD NO.:  [id number]          PATIENT TYPE:  AMB   LOCATION:  ENDO                         FACILITY:  [facility]   PHYSICIAN:  Graylin Shiver, M.D.   DATE OF BIRTH:  [birth year]   DATE OF PROCEDURE:  01/24/2004  DATE OF DISCHARGE:                                 OPERATIVE REPORT   PROCEDURE:  Colonoscopy with polypectomy.   INDICATIONS FOR PROCEDURE:  Rectal bleeding.  Informed consent was obtained  after explanation of the risks of bleeding, infection, and perforation.   PREMEDICATION:  Fentanyl 75 mcg IV, Versed 6 mg IV.   DESCRIPTION OF PROCEDURE:  With the patient in the left lateral decubitus  position, a rectal examination was performed.  No masses were felt.  The  Olympus colonoscope was inserted into the rectum and advanced around the  colon to the cecum.  Cecal landmarks were identified.  The cecum and  ascending colon were normal.  The transverse colon was normal.  The  descending colon and sigmoid normal.  In the distal rectum, there was an 8  mm sessile polyp which was snared with a mini-snare and removed.  I applied  cautery briefly after the polyp was snared, but the polyp came off before  good cautery was applied and it essentially came off as a cold snare  polypectomy.  There was some minimal oozing at the base.  This was injected  with 1 mL of 1:10,000 epinephrine.  The area was washed.  There was no  significant bleeding.  The polyp was retrieved.  The scope was brought out.  There were some hemorrhoidal tags noted.  She tolerated the procedure well.   IMPRESSION:  1.  Rectal polyp.  2.  External hemorrhoidal tags.   PLAN:  The pathology will be checked.  We will have the patient avoid  aspirin and nonsteroidal anti-inflammatory agents for the next two weeks.       SFG/MEDQ  D:  01/24/2004  T:  01/24/2004  Job:  [id number]   cc:   Chales Salmon. Abigail Miyamoto, M.D.  [address]   [place]  [place] [address]  Fax: [phone]

## 2010-07-13 NOTE — Consult Note (Signed)
NAME:  Dawn Mercer, Dawn Mercer             ACCOUNT NO.:  [id number]   MEDICAL RECORD NO.:  [id number]          PATIENT TYPE:  OUT   LOCATION:  GYN                          [facility]ACILITY:  [facility]   PHYSICIAN:  Paola A. Duard Brady, MD    DATE OF BIRTH:  29-Sep-1945   DATE OF CONSULTATION:  07/10/2004  DATE OF DISCHARGE:                                   CONSULTATION   DATE OF CONSULTATION:  Jul 10, 2004.   Dawn Mercer is a 64 year old who was diagnosed with stage 1B adenosquamous  carcinoma of the cervix and underwent radiation therapy, which she completed  in September of 2001.  She has been followed expectantly since that time  without evidence of recurrent disease.  In March of 2006, she was seen by me  in clinic.  She had a vulvar biopsy of a vulvar lesion on her left labia  minora which revealed VIN 3.  She subsequently was dispositioned to laser  ablation of the vulva, which she underwent on June 19, 2004.  The findings  included an aceto-white epithelial area that replaced the entire left labia  minora.  The remainder of the vulva was inspected and had no evidence of  vulvar dysplasia.  She comes in today for her post laser check.  She is  overall doing quite well.  She had some stinging and burning but has had  significant relief with the Silvadene cream.  She otherwise has no  complaints.   PHYSICAL EXAMINATION:  GENERAL APPEARANCE:  Weight 202 pounds.  Well-  nourished, well-developed female in no acute distress.  PELVIC:  She has a well-healed left vulva, status post laser.  There were  questionable white changes in the crease where the labia minora meets the  labia majora.  It is difficult to ascertain if this could represent residual  dysplasia versus the healing process.  It is too soon to elucidate this  issue.   ASSESSMENT:  A 65 year old with a history of cervical carcinoma, who also  vulvar intraepithelial neoplasia 3.   PLAN:  1.  She was given a prescription for additional  Silvadene cream to use.  2.  I would like to see her back in 3 months to ensure that the vulvar area      is completely healed and this does not represent persistent disease.  3.  She knows to contact us should she have worsening pruritus or notice a      lesion.  4.  If that next 70-month visit is normal, we will then get her set up in      conjunction with her q.-58-month cervical cancer checks.      PAG/MEDQ  D:  07/10/2004  T:  07/10/2004  Job:  [id number]   cc:   Jonny Ruiz T. Kyla Balzarine, M.D.   Billie Lade, M.D.  [address] - [facility]  [place]  [place] [address]  Fax: [phone]   Chales Salmon. Abigail Miyamoto, M.D.  [address]  [place]  [place] [address]  Fax: [phone]   Telford Nab, R.N.  [phone] [address]  [place], [place] [address]

## 2010-09-12 ENCOUNTER — Other Ambulatory Visit: Payer: Self-pay | Admitting: Gynecologic Oncology

## 2010-09-12 ENCOUNTER — Other Ambulatory Visit (HOSPITAL_COMMUNITY)
Admission: RE | Admit: 2010-09-12 | Discharge: 2010-09-12 | Disposition: A | Discharge status: Home / Self Care | Payer: Medicare Other | Source: Ambulatory Visit | Attending: Gynecologic Oncology | Admitting: Gynecologic Oncology

## 2010-09-12 ENCOUNTER — Ambulatory Visit: Payer: Medicare Other | Attending: Gynecologic Oncology | Admitting: Gynecologic Oncology

## 2010-09-12 DIAGNOSIS — C539 Malignant neoplasm of cervix uteri, unspecified: Secondary | ICD-10-CM | POA: Insufficient documentation

## 2010-09-12 DIAGNOSIS — Z8540 Personal history of malignant neoplasm of unspecified female genital organ: Secondary | ICD-10-CM | POA: Insufficient documentation

## 2010-09-14 NOTE — Consult Note (Signed)
NAME:  Dawn Mercer, Dawn Mercer             ACCOUNT NO.:  [id number]  MEDICAL RECORD NO.:  [id number]  LOCATION:  GYN                          FACILITY:  [facility]  PHYSICIAN:  Jenelle Drennon A. Duard Brady, MD    DATE OF BIRTH:  1946/01/26  DATE OF CONSULTATION:  09/12/2010 DATE OF DISCHARGE:                                CONSULTATION   HISTORY OF PRESENT ILLNESS:  Ms. 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  Similarly has been free of disease since that time.  I last saw her in June of last year at which time her exam was unremarkable.  Pap smear was negative.  She comes in today for followup.  She is overall doing quite well.  Her biggest concern as her brother who is 39 and was diagnosed with Alzheimer's disease.  He also suffers from anxiety.  She has lost about 4 to 5 pounds and has been very happy about that, that has been intentional, it has been related to dietary modification.  She denies any change in bowel or bladder habits, any vaginal bleeding or any vulvar pruritus.  The small lesion that she had on the vulva which is a small cyst is unchanged per her report.  MEDICATION:  Medication list is reviewed and includes Synthroid, Zocor and Librax.  It did treat her spastic colon and IBS symptoms.  HEALTH MAINTENANCE:  She does not like mammogram.  She does self breast exam.  She had a colonoscopy in early part of 2012 and they recommended a 5-year followup.  PHYSICAL EXAMINATION:  VITAL SIGNS:  Weight 191 pounds which is down 8 pounds since last year, blood pressure 128/58, pulse 72, respirations 18, temperature 97.7. GENERAL:  A well-nourished, well-developed female, in no acute distress. NECK:  Supple.  There is no lymphadenopathy, no thyromegaly. LUNGS:   Clear to auscultation bilaterally. CARDIOVASCULAR:  Regular rate and rhythm. ABDOMEN:  Soft, nontender, nondistended.  There are no palpable masses or hepatosplenomegaly.  Groins are negative for adenopathy. EXTREMITIES:  There is no edema. PELVIC:  External genitalia is atrophic.  She has a small mobile cyst measuring approximately 1 cm on the labia majora.  There is no overlying skin changes of vascularity.  The vagina is atrophic.  The cervix cannot be visualized.  The vaginal cuff has no visible lesions.  ThinPrep Pap was submitted without difficulty.  Bimanual examination reveals no masses or nodularity.  Rectal confirms.  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  PLAN:  We will follow up on results of her Pap smear from today.  If her Pap smear is normal, she can follow up with her local gynecologist.  We will refer her to Dr. Emelda Fear in [place].  She knows we will be happy to see her in  the future should the need arise.     Duglas Heier A. Duard Brady, MD     PAG/MEDQ  D:  09/12/2010  T:  09/12/2010  Job:  [id number]  cc:   Billie Lade, Ph.D., M.D. Fax: [phone]  Graylin Shiver, M.D. Fax: [phone]  Magnus Sinning) Tenny Craw, M.D. Fax: [phone]  Telford Nab, R.N. [address] [place], [place] [address]  Chales Salmon. Abigail Miyamoto, M.D. Fax: [phone]  Electronically Signed by Cleda Mccreedy MD on 09/14/2010 10:19:11 AM

## 2011-11-08 ENCOUNTER — Other Ambulatory Visit: Payer: Self-pay | Admitting: Family Medicine

## 2011-11-08 DIAGNOSIS — R109 Unspecified abdominal pain: Secondary | ICD-10-CM

## 2011-11-14 ENCOUNTER — Ambulatory Visit
Admission: RE | Admit: 2011-11-14 | Discharge: 2011-11-14 | Disposition: A | Discharge status: Home / Self Care | Payer: Medicare Other | Source: Ambulatory Visit | Attending: Family Medicine | Admitting: Family Medicine

## 2011-11-14 DIAGNOSIS — R109 Unspecified abdominal pain: Secondary | ICD-10-CM

## 2011-11-18 ENCOUNTER — Encounter: Payer: Self-pay | Admitting: Gynecologic Oncology

## 2011-11-18 ENCOUNTER — Other Ambulatory Visit (HOSPITAL_COMMUNITY)
Admission: RE | Admit: 2011-11-18 | Discharge: 2011-11-18 | Disposition: A | Discharge status: Home / Self Care | Payer: Medicare Other | Source: Ambulatory Visit | Attending: Gynecology | Admitting: Gynecology

## 2011-11-18 ENCOUNTER — Encounter: Payer: Self-pay | Admitting: Gynecology

## 2011-11-18 ENCOUNTER — Ambulatory Visit: Payer: Medicare Other | Attending: Gynecology | Admitting: Gynecology

## 2011-11-18 VITALS — BP 130/68 | HR 70 | Temp 98.0°F | Resp 24 | Ht 65.39 in | Wt 192.1 lb

## 2011-11-18 DIAGNOSIS — Z8049 Family history of malignant neoplasm of other genital organs: Secondary | ICD-10-CM | POA: Insufficient documentation

## 2011-11-18 DIAGNOSIS — Z79899 Other long term (current) drug therapy: Secondary | ICD-10-CM | POA: Insufficient documentation

## 2011-11-18 DIAGNOSIS — Z01419 Encounter for gynecological examination (general) (routine) without abnormal findings: Secondary | ICD-10-CM | POA: Insufficient documentation

## 2011-11-18 DIAGNOSIS — K219 Gastro-esophageal reflux disease without esophagitis: Secondary | ICD-10-CM | POA: Insufficient documentation

## 2011-11-18 DIAGNOSIS — Z923 Personal history of irradiation: Secondary | ICD-10-CM | POA: Insufficient documentation

## 2011-11-18 DIAGNOSIS — Z801 Family history of malignant neoplasm of trachea, bronchus and lung: Secondary | ICD-10-CM | POA: Insufficient documentation

## 2011-11-18 DIAGNOSIS — E039 Hypothyroidism, unspecified: Secondary | ICD-10-CM | POA: Insufficient documentation

## 2011-11-18 DIAGNOSIS — C539 Malignant neoplasm of cervix uteri, unspecified: Secondary | ICD-10-CM | POA: Insufficient documentation

## 2011-11-18 NOTE — Progress Notes (Signed)
Follow Up Note: Gyn-Onc  Dawn Mercer 66 y.o. female  CC:  Chief Complaint  Patient presents with  . Cervical Cancer    Follow up   HPI:  Dawn Mercer is a 66 year old female who presented to GYN Oncology in July 2000.  She was referred by Dr. Beather Arbour after a cervical biopsy revealed invasive poorly differentiated adenosquamous carcinoma.  She was seen by Dr. Roselind Messier as a new patient in late July of 2000 with radiation beginning the first week of August.  In September 2000, she completed radiation with external beam and cesium implant for Stage IB adenosquamous carcinoma of the cervix.  In March of 2006, she had extensive laser ablation along with aldara treatment to treat VIN III.  She has been free of disease since that time.  After her last visit with Dr. Duard Brady in July of 2012, it was recommended that she follow up with Dr. Emelda Fear for care in [place], [place].    Interval History:  Dawn Mercer presents today for evaluation of bleeding that began 2 weeks ago after urination.  During a [place] to the mountains, patient reporting blood when wiping after urination.  Denies dysuria, frequency, urgency, or incontinence at that time.  Stating that the bleeding lasted two days then stopped.  Amount of blood reported as less than a menstrual cycle.  Unsure whether the bleeding was urethral or vaginal.  Denies rectal bleeding.  Reporting intermittent, sharp pains in the LLQ abdomen when lying down.  After following up with Dr. Abigail Miyamoto, reporting taking an antibiotic "in case I had a bad bladder infection" along with an ultrasound that was unremarkable.  Very anxious today.  "Just want to make sure everything is ok."      Review of Systems: Constitutional: Anxious.  Felt well prior to episode of bleeding 2 weeks ago. Able to complete activities of daily living without difficulty.  No fever, malaise, or fatigue.  Skin: No rash, sores, jaundice, itching, dryness.  Cardiovascular: No chest pain,  shortness of breath, or edema.  Pulmonary: No cough or wheeze.  Gastro Intestinal: No nausea, vomiting, constipation, or diarrhea reported. No bright red blood per rectum or change in bowel movement.  Reporting bloating intermittently and early satiety.  Pt thinks symptoms are related to IBS.  Reporting relief when taking prescribed medications.    Genitourinary: No frequency, urgency, or dysuria.  Reports bleeding that started two weeks ago after urinating.   Musculoskeletal: No myalgia, arthralgia, joint swelling, or pain.  Neurologic: No weakness, numbness, or change in gait.  Psychology: Reports feeling anxious related to the bleeding episode.  No depression or insomnia.   Current Meds:  Outpatient Encounter Prescriptions as of 11/18/2011  Medication Sig Dispense Refill  . clidinium-chlordiazePOXIDE (LIBRAX) 2.5-5 MG per capsule Take 1 capsule by mouth as needed.      . diphenoxylate-atropine (LOMOTIL) 2.5-0.025 MG per tablet Take 1 tablet by mouth once a week. On Sunday      . Esomeprazole Magnesium (NEXIUM PO) Take by mouth every other day.      Marland Kitchen LEVOTHYROXINE SODIUM PO Take by mouth daily.      . simvastatin (ZOCOR) 40 MG tablet Take 40 mg by mouth every other day.       Allergy:  Allergies  Allergen Reactions  . Codeine Nausea Only   Social Hx:   History   Social History  . Marital Status: Married    Spouse Name: N/A    Number of Children:  N/A  . Years of Education: N/A   Occupational History  . Not on file.   Social History Main Topics  . Smoking status: Former Smoker    Start date: 02/25/1998  . Smokeless tobacco: Not on file  . Alcohol Use: No  . Drug Use: No  . Sexually Active: Not on file   Other Topics Concern  . Not on file   Social History Narrative  . No narrative on file   Past Surgical Hx:  Past Surgical History  Procedure Date  . Laser ablation 04/2004    for VIN III   Past Medical Hx:  Past Medical History  Diagnosis Date  . Cervical cancer  10/1999    Stage IB adenosquamous carcinoma  . 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 Status post radiation therapy 10/1999  . GERD (gastroesophageal reflux disease)   . Hypothyroidism    Family Hx:  Family History  Problem Relation Age of Onset  . Heart disease Father   . Cervical cancer Sister   . Lung cancer Brother   . Heart disease Brother    Vitals:  Blood pressure 130/68, pulse 70, temperature 98 F (36.7 C), resp. rate 24, height 5' 5.39" (1.661 m), weight 192 lb 1.6 oz (87.136 kg).  Physical Exam:  General: Well developed, well nourished female in no acute distress. Alert, oriented x3.  Anxious.  Cardiovascular: Heart rate and rhythm regular. S1 and S2 normal. No murmurs, clicks, or rubs noted.  Lungs: Clear to auscultation bilaterally. No wheezes, crackles, or rhonchi.  Skin: No rash or skin breakdown noted Lymph:  No subclavicular or inguinal adenopathy noted  Abdomen: Normoactive bowel sounds, abdomen soft, non-tender and obese.  Pelvic: Vulva:  External genitalia atrophic.  1 cm varicosity noted on the upper portion of the right labia majora and a small abrasion noted on the lower portion of the right labia majora.     Vagina: No visible lesions or palpable vaginal/ pelvic masses. No discharge or bleeding noted.  Cervix not visualized.  ThinPrep pap completed without difficulty.  Rectal: Good rectal tone. No masses. No cul de sac nodularity.  Extremities:  No cyanosis or edema.  Assessment/Plan: Dawn Mercer is a 65 year old female with a history of Stage IB adenosquamous carcinoma of the cervix with diagnosis and treatment in 2000.  In March of 2006, she was diagnosed and treated for VIN III.  There has been no evidence of recurrent disease since that time.  Plan to call the patient with the results of the pap smear.  If normal, she can follow up with Dr. Emelda Fear or a local gynecologist of her choice.  She can return to Gynecologic Oncology as needed if  problems arise.  Dr. Stanford Breed was present during the physical exam and concurs with the above.  Lenia Housley DEAL, NP 11/18/2011, 12:31 PM

## 2011-11-18 NOTE — Patient Instructions (Addendum)
We will contact you with your pap smear results.  If pap test normal, you can follow up with Dr. Emelda Fear in [place], [place] or your gynecologist of choice.  Follow up with Gynecologic Oncology as needed.  Thank you for coming to see [place] today.  We appreciate your confidence in choosing [facility] Gynecologic On[facility] for your medical care.  If you have any questions about your visit today, please call our office and we will get back to you as soon as possible.  Dr. De Blanch and Warner Mccreedy, NP Gynecologic Oncology

## 2011-11-22 ENCOUNTER — Telehealth: Payer: Self-pay | Admitting: Gynecologic Oncology

## 2011-11-22 NOTE — Telephone Encounter (Signed)
Pt notified about pap results: ASCUS.  Instructed that Dr. Stanford Breed would be informed of the results and that she would be contacted next week with his recommendations.  No questions or concerns voiced.

## 2011-12-11 ENCOUNTER — Telehealth: Payer: Self-pay | Admitting: Gynecologic Oncology

## 2011-12-11 NOTE — Telephone Encounter (Signed)
Pt informed of Dr. Nelwyn Salisbury recommendations to repeat pap smear in November 2013.  Appointment arranged for Nov 25 at 12:45pm.  No concerns or questions voiced.  Instructed to call for any needs.

## 2011-12-24 ENCOUNTER — Other Ambulatory Visit: Payer: Self-pay | Admitting: Family Medicine

## 2011-12-24 DIAGNOSIS — Z1231 Encounter for screening mammogram for malignant neoplasm of breast: Secondary | ICD-10-CM

## 2012-01-07 ENCOUNTER — Ambulatory Visit: Payer: Medicare Other | Admitting: Urology

## 2012-01-20 ENCOUNTER — Encounter: Payer: Self-pay | Admitting: Gynecology

## 2012-01-20 ENCOUNTER — Other Ambulatory Visit (HOSPITAL_COMMUNITY)
Admission: RE | Admit: 2012-01-20 | Discharge: 2012-01-20 | Disposition: A | Discharge status: Home / Self Care | Payer: Medicare Other | Source: Ambulatory Visit | Attending: Gynecology | Admitting: Gynecology

## 2012-01-20 ENCOUNTER — Ambulatory Visit: Payer: Medicare Other | Attending: Gynecology | Admitting: Gynecology

## 2012-01-20 VITALS — BP 126/64 | HR 80 | Temp 97.6°F | Resp 22 | Wt 193.2 lb

## 2012-01-20 DIAGNOSIS — C539 Malignant neoplasm of cervix uteri, unspecified: Secondary | ICD-10-CM

## 2012-01-20 DIAGNOSIS — Z124 Encounter for screening for malignant neoplasm of cervix: Secondary | ICD-10-CM | POA: Insufficient documentation

## 2012-01-20 NOTE — Progress Notes (Signed)
Follow Up Note: Gyn-Onc  [name] 66 y.o. female  CC:  Chief Complaint  Patient presents with  . Cervical Cancer    Follow up   HPI:  Dawn Mercer is a 66 year old female who presented to GYN Oncology in July 2000.  She was referred by Dr. Beather Arbour after a cervical biopsy revealed invasive poorly differentiated adenosquamous carcinoma.  She was seen by Dr. Roselind Messier as a new patient in late July of 2000 with radiation beginning the first week of August.  In September 2000, she completed radiation with external beam and cesium implant for Stage IB adenosquamous carcinoma of the cervix.  In March of 2006, she had extensive laser ablation along with aldara treatment to treat VIN III.  She has been free of disease since that time.  After her last visit with Dr. Duard Brady in July of 2012, it was recommended that she follow up with Dr. Emelda Fear for care in [place], [place].  She was seen on November 18, 2011 for evaluation of bleeding that began 2 weeks ago after urination.  During a trip to the mountains, patient reporting blood when wiping after urination.  Denied dysuria, frequency, urgency, or incontinence at that time.  Stating that the bleeding lasted two days then stopped.  Amount of blood reported as less than a menstrual cycle.  Unsure whether the bleeding was urethral or vaginal.  Denied rectal bleeding.  Reporting intermittent, sharp pains in the LLQ abdomen when lying down.  After following up with Dr. Abigail Miyamoto, reporting taking an antibiotic "in case I had a bad bladder infection" along with an ultrasound that was unremarkable.    Interval History:  Dawn Mercer presents today for follow up and repeat pap smear.  Her pap smear performed on November 18, 2011 resulted ASCUS with Dr. Nelwyn Salisbury recommendations for a repeat pap smear.  Since her last visit, she denies hematuria, vaginal bleeding/discharge, rectal bleeding, or abdominal pain.  No complaints voiced.   Review of  Systems: Constitutional:  Feels well.  "Not as nervous as I was last time."  Able to complete activities of daily living without difficulty.  No fever, malaise, or fatigue.  Skin: No rash, sores, jaundice, itching, or dryness.  Cardiovascular: No chest pain, shortness of breath, or edema.  Pulmonary: No cough or wheeze.  Gastro Intestinal: No nausea, vomiting, constipation, or diarrhea reported. No bright red blood per rectum or change in bowel movement.   Genitourinary: No frequency, urgency, hematuria, or dysuria.     Musculoskeletal: No myalgia, arthralgia, joint swelling, or pain.  Neurologic: No weakness, numbness, or change in gait.  Psychology:  Reports a decrease in anxiety since last visit.  No depression or insomnia.   Current Meds:  Outpatient Encounter Prescriptions as of 01/20/2012  Medication Sig Dispense Refill  . clidinium-chlordiazePOXIDE (LIBRAX) 2.5-5 MG per capsule Take 1 capsule by mouth as needed.      . diphenoxylate-atropine (LOMOTIL) 2.5-0.025 MG per tablet Take 1 tablet by mouth once a week. On Sunday      . Esomeprazole Magnesium (NEXIUM PO) Take by mouth every other day.      Marland Kitchen LEVOTHYROXINE SODIUM PO Take by mouth daily.      . simvastatin (ZOCOR) 40 MG tablet Take 40 mg by mouth every other day.       Allergy:  Allergies  Allergen Reactions  . Codeine Nausea Only   Social Hx:   History   Social History  . Marital Status: Married  Spouse Name: N/A    Number of Children: N/A  . Years of Education: N/A   Occupational History  . Not on file.   Social History Main Topics  . Smoking status: Former Smoker    Start date: 02/25/1998  . Smokeless tobacco: Not on file  . Alcohol Use: No  . Drug Use: No  . Sexually Active: Not on file   Other Topics Concern  . Not on file   Social History Narrative  . No narrative on file   Past Surgical Hx:  Past Surgical History  Procedure Date  . Laser ablation 04/2004    for VIN III   Past Medical Hx:    Past Medical History  Diagnosis Date  . 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 Status post radiation therapy 10/1999  . GERD (gastroesophageal reflux disease)   . Hypothyroidism    Family Hx:  Family History  Problem Relation Age of Onset  . Heart disease Father   . Cervical cancer Sister   . Lung cancer Brother   . Heart disease Brother    Vitals:  Blood pressure 126/64, pulse 80, temperature 97.6 F (36.4 C), temperature source Oral, resp. rate 22, weight 193 lb 3.2 oz (87.635 kg).  Physical Exam:  General: Well developed, well nourished female in no acute distress. Alert, oriented x3.   Cardiovascular: Heart rate and rhythm regular. S1 and S2 normal. No murmurs, clicks, or rubs noted.  Lungs: Clear to auscultation bilaterally. No wheezes, crackles, or rhonchi.  Skin: No rash or skin breakdown noted. Pelvic: Vulva:  External genitalia atrophic.  1 cm varicosity noted on the upper portion of the right labia majora unchanged from last visit.     Vagina: No visible lesions or palpable vaginal/ pelvic masses. No discharge or bleeding noted.  Cervix not visualized.  ThinPrep pap completed without difficulty.  Rectal: Good rectal tone. No masses. No cul de sac nodularity.  Extremities:  No cyanosis or edema.  Assessment/Plan: Mrs. Dawn Mercer is a 66 year old female with a history of Stage IB adenosquamous carcinoma of the cervix with diagnosis and treatment in 2000.  In March of 2006, she was diagnosed and treated for VIN III.  There has been no evidence of recurrent disease since that time.  Plan to call the patient with the results of the repeat pap smear.  If normal, she can follow up with Dr. Emelda Fear or a local gynecologist of her choice.  She can return to Gynecologic Oncology as needed if problems arise.  Dr. Stanford Breed was present during the physical exam and concurs with the above.  Dawn Mercer,  Dawn Mercer DEAL, NP 01/20/2012, 2:50 PM

## 2012-01-20 NOTE — Patient Instructions (Addendum)
We will call you with the results of the repeat pap smear.  If normal, you can follow up with Dr. Emelda Fear or a local gynecologist of your choice.  You can return to Gynecologic Oncology as needed if problems arise in the future.  Thank you for coming to see [place] today.  We appreciate your confidence in choosing [facility] Gynecologic Oncology for your medical care.  If you have any questions about your visit today, please call our office and we will get back to you as soon as possible.  Dr. De Blanch and Warner Mccreedy, NP Gynecologic Oncology

## 2012-01-27 ENCOUNTER — Telehealth: Payer: Self-pay | Admitting: Gynecologic Oncology

## 2012-01-27 NOTE — Telephone Encounter (Signed)
Pt informed of pap smear results and Dr. Nelwyn Salisbury recommendations for follow up with Dr. Emelda Fear in the future.

## 2012-02-05 ENCOUNTER — Ambulatory Visit
Admission: RE | Admit: 2012-02-05 | Discharge: 2012-02-05 | Disposition: A | Discharge status: Home / Self Care | Payer: Medicare Other | Source: Ambulatory Visit | Attending: Family Medicine | Admitting: Family Medicine

## 2012-02-05 DIAGNOSIS — Z1231 Encounter for screening mammogram for malignant neoplasm of breast: Secondary | ICD-10-CM

## 2012-11-12 ENCOUNTER — Other Ambulatory Visit: Payer: Self-pay | Admitting: Family

## 2012-11-12 ENCOUNTER — Ambulatory Visit
Admission: RE | Admit: 2012-11-12 | Discharge: 2012-11-12 | Disposition: A | Discharge status: Home / Self Care | Payer: Medicare Other | Source: Ambulatory Visit | Attending: Family | Admitting: Family

## 2012-11-12 DIAGNOSIS — R0781 Pleurodynia: Secondary | ICD-10-CM

## 2012-11-16 ENCOUNTER — Other Ambulatory Visit: Payer: Self-pay | Admitting: Family

## 2012-11-16 DIAGNOSIS — R911 Solitary pulmonary nodule: Secondary | ICD-10-CM

## 2012-11-17 ENCOUNTER — Ambulatory Visit
Admission: RE | Admit: 2012-11-17 | Discharge: 2012-11-17 | Disposition: A | Discharge status: Home / Self Care | Payer: Medicare Other | Source: Ambulatory Visit | Attending: Family | Admitting: Family

## 2012-11-17 DIAGNOSIS — R911 Solitary pulmonary nodule: Secondary | ICD-10-CM

## 2012-11-17 MED ORDER — IOHEXOL 300 MG/ML  SOLN
75.0000 mL | Freq: Once | INTRAMUSCULAR | Status: AC | PRN
  Administered 2012-11-17: 75 mL via INTRAVENOUS

## 2012-12-04 ENCOUNTER — Other Ambulatory Visit (HOSPITAL_COMMUNITY): Payer: Self-pay | Admitting: Family Medicine

## 2012-12-04 DIAGNOSIS — R911 Solitary pulmonary nodule: Secondary | ICD-10-CM

## 2012-12-09 ENCOUNTER — Telehealth: Payer: Self-pay | Admitting: *Deleted

## 2012-12-09 NOTE — Telephone Encounter (Signed)
Called spoke with pt regarding appt for [facility] 12/17/12.  She verbalized understanding of appt

## 2012-12-14 ENCOUNTER — Telehealth: Payer: Self-pay | Admitting: *Deleted

## 2012-12-14 NOTE — Telephone Encounter (Signed)
Called to re-schedule appt.  No answer, will call back

## 2012-12-14 NOTE — Telephone Encounter (Signed)
Called and spoke with pt regarding appt for mtoc. She will be seen 12/24/12 at 2:00.  She verbalized understanding of time and place of appt

## 2012-12-15 ENCOUNTER — Encounter: Payer: Self-pay | Admitting: *Deleted

## 2012-12-15 ENCOUNTER — Telehealth: Payer: Self-pay | Admitting: *Deleted

## 2012-12-15 NOTE — Progress Notes (Signed)
Called to speak with [facility] regarding moving PET scan to an earlier time.  They stated they were unable to accommodate. Thoracic clinic appt will have to change to next week Oct 30 due to PET scan.  Pt is aware

## 2012-12-15 NOTE — Telephone Encounter (Signed)
[facility] appt will be 12/24/12 at 2:00.  Pt verbalized understanding of appt

## 2012-12-16 ENCOUNTER — Telehealth: Payer: Self-pay | Admitting: *Deleted

## 2012-12-16 NOTE — Telephone Encounter (Signed)
Pt called confused about appt's.  I explained that her PET was tomorrow and she will be seen at clinic next Thursday.  She verbalized understanding.

## 2012-12-17 ENCOUNTER — Encounter (HOSPITAL_COMMUNITY)
Admission: RE | Admit: 2012-12-17 | Discharge: 2012-12-17 | Disposition: A | Discharge status: Home / Self Care | Payer: Medicare Other | Source: Ambulatory Visit | Attending: Family Medicine | Admitting: Family Medicine

## 2012-12-17 ENCOUNTER — Encounter (HOSPITAL_COMMUNITY): Payer: Self-pay

## 2012-12-17 DIAGNOSIS — R911 Solitary pulmonary nodule: Secondary | ICD-10-CM

## 2012-12-17 DIAGNOSIS — J984 Other disorders of lung: Secondary | ICD-10-CM | POA: Insufficient documentation

## 2012-12-17 LAB — GLUCOSE, CAPILLARY: Glucose-Capillary: 95 mg/dL (ref 70–99)

## 2012-12-17 MED ORDER — FLUDEOXYGLUCOSE F - 18 (FDG) INJECTION
17.4000 | Freq: Once | INTRAVENOUS | Status: AC | PRN
  Administered 2012-12-17: 17.4 via INTRAVENOUS

## 2012-12-21 ENCOUNTER — Telehealth: Payer: Self-pay | Admitting: *Deleted

## 2012-12-21 ENCOUNTER — Encounter: Payer: Self-pay | Admitting: *Deleted

## 2012-12-21 NOTE — Telephone Encounter (Signed)
Spoke with pt regarding appt for thoracic surgery and radiation oncology to see pt.  She verbalized understanding

## 2012-12-21 NOTE — Telephone Encounter (Signed)
Left vm message regarding appt for mtoc 12/24/12 at 2:45

## 2012-12-24 ENCOUNTER — Ambulatory Visit: Payer: Medicare Other | Attending: Thoracic Surgery (Cardiothoracic Vascular Surgery) | Admitting: Physical Therapy

## 2012-12-24 ENCOUNTER — Telehealth: Payer: Self-pay | Admitting: *Deleted

## 2012-12-24 ENCOUNTER — Encounter: Payer: Self-pay | Admitting: *Deleted

## 2012-12-24 ENCOUNTER — Ambulatory Visit: Payer: Medicare Other | Admitting: Radiation Oncology

## 2012-12-24 ENCOUNTER — Encounter (INDEPENDENT_AMBULATORY_CARE_PROVIDER_SITE_OTHER): Payer: Self-pay

## 2012-12-24 ENCOUNTER — Institutional Professional Consult (permissible substitution) (INDEPENDENT_AMBULATORY_CARE_PROVIDER_SITE_OTHER): Payer: Medicare Other | Admitting: Thoracic Surgery (Cardiothoracic Vascular Surgery)

## 2012-12-24 DIAGNOSIS — R222 Localized swelling, mass and lump, trunk: Secondary | ICD-10-CM

## 2012-12-24 DIAGNOSIS — R590 Localized enlarged lymph nodes: Secondary | ICD-10-CM

## 2012-12-24 DIAGNOSIS — R918 Other nonspecific abnormal finding of lung field: Secondary | ICD-10-CM

## 2012-12-24 DIAGNOSIS — J449 Chronic obstructive pulmonary disease, unspecified: Secondary | ICD-10-CM

## 2012-12-24 DIAGNOSIS — R599 Enlarged lymph nodes, unspecified: Secondary | ICD-10-CM

## 2012-12-24 DIAGNOSIS — IMO0001 Reserved for inherently not codable concepts without codable children: Secondary | ICD-10-CM | POA: Insufficient documentation

## 2012-12-24 DIAGNOSIS — R0789 Other chest pain: Secondary | ICD-10-CM | POA: Insufficient documentation

## 2012-12-24 DIAGNOSIS — C3410 Malignant neoplasm of upper lobe, unspecified bronchus or lung: Secondary | ICD-10-CM | POA: Insufficient documentation

## 2012-12-24 NOTE — Telephone Encounter (Signed)
Called pt to make an earlier appt for mtoc 12/24/12 at 1:45.  She verbalized understanding

## 2012-12-24 NOTE — Telephone Encounter (Signed)
Called spoke with pt.  Dr. Dorris Fetch has been called to or for an emergency.  Her appt time is back at 3pm today.  She verbalized understanding.

## 2012-12-25 ENCOUNTER — Encounter: Payer: Self-pay | Admitting: Thoracic Surgery (Cardiothoracic Vascular Surgery)

## 2012-12-25 ENCOUNTER — Other Ambulatory Visit: Payer: Self-pay | Admitting: *Deleted

## 2012-12-25 DIAGNOSIS — R918 Other nonspecific abnormal finding of lung field: Secondary | ICD-10-CM

## 2012-12-25 NOTE — Progress Notes (Signed)
PCP is  Ross, Alan, MD Referring Provider is Ross, Alan, MD  No chief complaint on file.   HPI: 67 yo woman sent for consultation regarding a right lung mass.  Dawn Mercer is 67 yo woman with a history of tobacco abuse (1 ppd x 34 years, quit in 2000). She was in her usual state of health until a few weeks ago. She presented with a cc/o right sided rib pain after bending over. She says that she bent over awkwardly to pick up something she had dropped. She felt a sudden stabbing pain in the right side of her chest. She had a CXR, which showed a right lung mass. A CT was done and it confirmed a 1.7 cm right upper lobe mass and mild hilar and mediastinal adenopathy. There was a right sided rib fracture with no appreciable mass. A PET/CT was done and it showed hypermetabolic uptake in the right lung mass, the hilar and mediastinal lymph nodes and the rib fracture. The rib uptake was felt to be due to a healing fracture but a metastasis could not definitely be ruled out.  She is a former smoker. She says she has COPD, but only rarely uses her Pro-Air inhaler. She denies any cardiac history. Her only chest pain is associated with the right sided rib fracture. She is active.  ECOG/ZUBROD= 0  Past Medical History  Diagnosis Date  . 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 Status post radiation therapy 10/1999  . GERD (gastroesophageal reflux disease)   . Hypothyroidism     Past Surgical History  Procedure Laterality Date  . Laser ablation  04/2004    for VIN III    Family History  Problem Relation Age of Onset  . Heart disease Father   . Cervical cancer Sister   . Lung cancer Brother   . Heart disease Brother     Social History History  Substance Use Topics  . Smoking status: Former Smoker    Start date: 02/25/1998  . Smokeless tobacco: Not on file  . Alcohol Use: No  35 pack years  Current Outpatient  Prescriptions  Medication Sig Dispense Refill  . ALPRAZolam (XANAX) 0.5 MG tablet       . clidinium-chlordiazePOXIDE (LIBRAX) 2.5-5 MG per capsule Take 1 capsule by mouth as needed.      . diphenoxylate-atropine (LOMOTIL) 2.5-0.025 MG per tablet Take 1 tablet by mouth once a week. On Sunday      . Esomeprazole Magnesium (NEXIUM PO) Take by mouth every other day.      . LEVOTHYROXINE SODIUM PO Take by mouth daily.      . PROAIR HFA 108 (90 BASE) MCG/ACT inhaler       . simvastatin (ZOCOR) 40 MG tablet Take 40 mg by mouth every other day.      . traMADol (ULTRAM) 50 MG tablet        No current facility-administered medications for this visit.    Allergies  Allergen Reactions  . Codeine Nausea Only    Review of Systems  Constitutional: Negative for fever, appetite change and unexpected weight change.  Respiratory: Positive for cough and wheezing (rare).   Cardiovascular: Positive for chest pain (right lateral chest, NO anginal pain).  Endocrine:       Hypothyroidism- treated  All other systems reviewed and are negative.    There were no vitals taken for this visit. Physical Exam    Constitutional: She is oriented to person, place, and time. She appears well-developed and well-nourished. No distress.  HENT:  Head: Normocephalic and atraumatic.  Eyes: EOM are normal. Pupils are equal, round, and reactive to light.  Neck: Neck supple. No thyromegaly present.  Cardiovascular: Normal rate, regular rhythm, normal heart sounds and intact distal pulses.  Exam reveals no gallop and no friction rub.   No murmur heard. Pulmonary/Chest: Effort normal. She has no wheezes. She has no rales.  Abdominal: Soft. There is no tenderness.  Musculoskeletal: She exhibits no edema.  Lymphadenopathy:    She has no cervical adenopathy.  Neurological: She is alert and oriented to person, place, and time. No cranial nerve deficit.  No focal deficits  Skin: Skin is warm and dry.     Diagnostic  Tests: CT CHEST 11/17/2012 CT CHEST WITH CONTRAST  TECHNIQUE:  Multidetector CT imaging of the chest was performed during  intravenous contrast administration.  CONTRAST: 75mL OMNIPAQUE IOHEXOL 300 MG/ML SOLN  COMPARISON: Chest x-ray of 11/12/2012  FINDINGS:  The nodule questioned by chest x-ray in the mid right lung  represents a spiculated nodular lesion within the anterior inferior  right upper lobe measuring 1.6 x 1.7 x 1.6 cm, most consistent with  a primary lung carcinoma. Linear extension is noted both to the  pleura as well as more medially. PET-CT is recommended to assess  further. Diffuse changes of centrilobular emphysema are noted. No  additional lung nodule is seen and no infiltrate is present. There  is no evidence of a pleural effusion. Mild biapical pleural  parenchymal scarring is present. The central bronchi is patent.  On soft tissue window images, the thoracic aorta opacifies with  moderate atheromatous change present. The origins of the great  vessels appear patent. The pulmonary arteries opacify with no  significant abnormality noted. No mediastinal or hilar adenopathy is  seen although there is a single slightly prominent precarinal lymph  node with short axis diameter of 10 mm. Also, there is some  prominence of a sub carinal lymph node with a short axis diameter of  9 mm. PET-CT would be helpful to assess for metabolic activity. A  moderate amount of epicardial fat is present. The portion of the  abdomen that is visualized is unremarkable. No bony abnormality is  seen  IMPRESSION:  1. 1.6 x 1.7 x 1.6 cm spiculated lesion in the anterior inferior  right upper lobe most consistent with primary lung carcinoma.  Recommend PET-CT.  2. Slightly prominent mediastinal lymph nodes. Again PET-CT would be  helpful to assess for metabolic activity.  3. Diffuse centrilobular emphysema.  Electronically Signed  By: Paul Barry M.D.  On: 11/17/2012 15:23   PET/CT  12/17/2012 NUCLEAR MEDICINE PET SKULL BASE TO THIGH  FASTING BLOOD GLUCOSE: Value: 95 mg/dl  TECHNIQUE:  17.4 mCi F-18 FDG was injected intravenously. CT data was obtained  and used for attenuation correction and anatomic localization only.  (This was not acquired as a diagnostic CT examination.) Additional  exam technical data entered on technologist worksheet.  COMPARISON: CT chest dated 11/17/2012  FINDINGS:  Note: For technical reasons, the calculated SUVs are considered  spuriously high. As such, the study will be reported qualitatively.  NECK  No hypermetabolic lymph nodes in the neck.  CHEST  1.7 x 1.7 cm spiculated hypermetabolic right upper lobe pulmonary  nodule (series 2/ image 78), compatible with primary bronchogenic  neoplasm.  Underlying moderate centrilobular and paraseptal emphysematous  changes. No pleural effusion or   pneumothorax.  13 mm short axis hypermetabolic precarinal node (series 2/ image  72), suspicious for nodal metastasis. Additional focal  hypermetabolism in the right hilar region (PET image 75), suspicious  for nodal metastasis.  ABDOMEN/PELVIS  No abnormal hypermetabolic activity within the liver, pancreas,  adrenal glands, or spleen.  No hypermetabolic lymph nodes in the abdomen or pelvis.  Brachytherapy seeds in the region of the cervix.  SKELETON  Healing fracture deformity involving the right anterolateral 6th rib  (series 2/ image 106). Associated hypermetabolism on PET (PET image  104), without associated soft tissue mass on CT.  IMPRESSION:  Hypermetabolic 1.7 cm spiculated right upper lobe pulmonary nodule,  compatible with primary bronchogenic neoplasm.  Associated right hilar and precarinal nodal metastases.  Healing anterolateral right 6th rib fracture with associated  hypermetabolism, nonspecific. Pathologic fracture is not entirely  excluded, although there is no associated soft tissue mass on CT.  Correlate for traumatic history.   Electronically Signed  By: Sriyesh Krishnan M.D.  On: 12/17/2012 17:07  Impression: 67 yo woman who presented after a right rib fracture nad has been found to have a right upper lobe mass. Workup including a CT and PET/CT show a 1.7 cm hypermetabolic RUL mass with hilar and mediastinal adenopathy. There is also a right 6th rib fracture. She most likely has a new lung cancer, likely stage IIIA, if not stage IV(rib). The rib fracture is a bit mysterious. There is no soft tissue mass or findings on CT to suggest a met. However, the relatively mild trauma is worrisome for a pathologic fracture.  I had a long discussion with Dawn Mercer and her husband. They understand that this is highly likely to be lung cancer and most likely is at least regionally advanced if not widely metastatic. We need a tissue biopsy for confirmation prior to treatment.  The lung mass itself is amenable to CT guided biopsy but would not yield information about the mediastinal node. She is a low risk surgical patient. Therefore, in my opinion the best option is to do ENB and EBUS to be able to biopsy the primary mass and the mediastinal nodes at the same setting.  I discussed the procedure with the patient an her husband. They understand the need for general anesthesia, the endoscopic approach and the likelihood it can be done as an outpatient procedure. We discussed the indications, risks, benefits and alternatives. They understand the risks include, but are not limited to bleeding, pneumothorax, failure to make a diagnosis, as well as very improbable ones such as death, MI, DVT, PE, stroke. She accepts the risks and agrees to proceed.     Plan:  

## 2012-12-28 ENCOUNTER — Encounter (HOSPITAL_COMMUNITY): Payer: Self-pay | Admitting: Pharmacy Technician

## 2012-12-30 ENCOUNTER — Encounter (HOSPITAL_COMMUNITY): Payer: Self-pay

## 2012-12-30 ENCOUNTER — Ambulatory Visit (HOSPITAL_COMMUNITY)
Admission: RE | Admit: 2012-12-30 | Discharge: 2012-12-30 | Disposition: A | Discharge status: Home / Self Care | Payer: Medicare Other | Source: Ambulatory Visit | Attending: Anesthesiology | Admitting: Anesthesiology

## 2012-12-30 ENCOUNTER — Encounter (HOSPITAL_COMMUNITY)
Admission: RE | Admit: 2012-12-30 | Discharge: 2012-12-30 | Disposition: A | Discharge status: Home / Self Care | Payer: Medicare Other | Source: Ambulatory Visit | Attending: Thoracic Surgery (Cardiothoracic Vascular Surgery) | Admitting: Thoracic Surgery (Cardiothoracic Vascular Surgery)

## 2012-12-30 VITALS — BP 119/75 | HR 60 | Temp 99.0°F | Resp 18 | Ht 66.0 in | Wt 192.5 lb

## 2012-12-30 DIAGNOSIS — R918 Other nonspecific abnormal finding of lung field: Secondary | ICD-10-CM

## 2012-12-30 HISTORY — DX: Irritable bowel syndrome, unspecified: K58.9

## 2012-12-30 HISTORY — DX: Personal history of other diseases of the digestive system: Z87.19

## 2012-12-30 HISTORY — DX: Chronic obstructive pulmonary disease, unspecified: J44.9

## 2012-12-30 HISTORY — DX: Unspecified convulsions: R56.9

## 2012-12-30 HISTORY — DX: Allergic rhinitis due to pollen: J30.1

## 2012-12-30 HISTORY — DX: Bronchitis, not specified as acute or chronic: J40

## 2012-12-30 HISTORY — DX: Shortness of breath: R06.02

## 2012-12-30 LAB — PROTIME-INR
INR: 0.97 (ref 0.00–1.49)
Prothrombin Time: 12.7 seconds (ref 11.6–15.2)

## 2012-12-30 LAB — CBC
Hemoglobin: 15.1 g/dL — ABNORMAL HIGH (ref 12.0–15.0)
MCH: 30.5 pg (ref 26.0–34.0)
MCHC: 34 g/dL (ref 30.0–36.0)
MCV: 89.7 fL (ref 78.0–100.0)
RBC: 4.95 MIL/uL (ref 3.87–5.11)

## 2012-12-30 LAB — COMPREHENSIVE METABOLIC PANEL
CO2: 28 mEq/L (ref 19–32)
Calcium: 9.2 mg/dL (ref 8.4–10.5)
Creatinine, Ser: 0.86 mg/dL (ref 0.50–1.10)
GFR calc Af Amer: 80 mL/min — ABNORMAL LOW (ref >90)
GFR calc non Af Amer: 69 mL/min — ABNORMAL LOW (ref >90)
Glucose, Bld: 86 mg/dL (ref 70–99)
Total Protein: 7.8 g/dL (ref 6.0–8.3)

## 2012-12-30 NOTE — Pre-Procedure Instructions (Signed)
ALYNA STENSLAND  12/30/2012   Your procedure is scheduled on:  Thursday, December 31, 2012  Report to [facility] Short Stay (use Main Entrance "A'') at 6:00 AM.  Call this number if you have problems the morning of surgery: [phone]   Remember:   Do not eat food or drink liquids after midnight.   Take these medicines the morning of surgery with A SIP OF WATER:esomeprazole (NEXIUM) 40 MG capsule,  levothyroxine (SYNTHROID, LEVOTHROID) 112 MCG tablet,  if needed: traMADol (ULTRAM) 50 MG tablet for pain, albuterol (PROVENTIL HFA;VENTOLIN HFA) 108 (90 BASE) MCG/ACT inhaler for wheezing ( Bring inhaler with you on the day of surgery)  Do not wear jewelry, make-up or nail polish.  Do not wear lotions, powders, or perfumes. You may wear deodorant.  Do not shave 48 hours prior to surgery.  Do not bring valuables to the hospital.  [facility] is not responsible for any belongings or valuables.               Contacts, dentures or bridgework may not be worn into surgery.  Leave suitcase in the car. After surgery it may be brought to your room.  For patients admitted to the hospital, discharge time is determined by your treatment team.               Patients discharged the day of surgery will not be allowed to drive home.  Name and phone number of your driver:  Special Instructions: Shower using CHG 2 nights before surgery and the night before surgery.  If you shower the day of surgery use CHG.  Use special wash - you have one bottle of CHG for all showers.  You should use approximately 1/3 of the bottle for each shower.   Please read over the following fact sheets that you were given: Pain Booklet, Coughing and Deep Breathing and Surgical Site Infection Prevention

## 2012-12-31 ENCOUNTER — Ambulatory Visit (HOSPITAL_COMMUNITY): Payer: Medicare Other

## 2012-12-31 ENCOUNTER — Encounter (HOSPITAL_COMMUNITY): Payer: Self-pay | Admitting: Certified Registered Nurse Anesthetist

## 2012-12-31 ENCOUNTER — Ambulatory Visit (HOSPITAL_COMMUNITY)
Admission: RE | Admit: 2012-12-31 | Discharge: 2012-12-31 | Disposition: A | Discharge status: Home / Self Care | Payer: Medicare Other | Source: Ambulatory Visit | Attending: Thoracic Surgery (Cardiothoracic Vascular Surgery) | Admitting: Thoracic Surgery (Cardiothoracic Vascular Surgery)

## 2012-12-31 ENCOUNTER — Encounter (HOSPITAL_COMMUNITY)
Admission: RE | Disposition: A | Discharge status: Home / Self Care | Payer: Self-pay | Source: Ambulatory Visit | Attending: Thoracic Surgery (Cardiothoracic Vascular Surgery)

## 2012-12-31 ENCOUNTER — Encounter (HOSPITAL_COMMUNITY): Payer: Medicare Other | Admitting: Certified Registered Nurse Anesthetist

## 2012-12-31 ENCOUNTER — Ambulatory Visit (HOSPITAL_COMMUNITY): Payer: Medicare Other | Admitting: Certified Registered Nurse Anesthetist

## 2012-12-31 DIAGNOSIS — IMO0001 Reserved for inherently not codable concepts without codable children: Secondary | ICD-10-CM | POA: Insufficient documentation

## 2012-12-31 DIAGNOSIS — Z01812 Encounter for preprocedural laboratory examination: Secondary | ICD-10-CM | POA: Insufficient documentation

## 2012-12-31 DIAGNOSIS — C3490 Malignant neoplasm of unspecified part of unspecified bronchus or lung: Secondary | ICD-10-CM | POA: Insufficient documentation

## 2012-12-31 DIAGNOSIS — K219 Gastro-esophageal reflux disease without esophagitis: Secondary | ICD-10-CM | POA: Insufficient documentation

## 2012-12-31 DIAGNOSIS — C771 Secondary and unspecified malignant neoplasm of intrathoracic lymph nodes: Secondary | ICD-10-CM | POA: Insufficient documentation

## 2012-12-31 DIAGNOSIS — J4489 Other specified chronic obstructive pulmonary disease: Secondary | ICD-10-CM | POA: Insufficient documentation

## 2012-12-31 DIAGNOSIS — D381 Neoplasm of uncertain behavior of trachea, bronchus and lung: Secondary | ICD-10-CM

## 2012-12-31 DIAGNOSIS — Z8541 Personal history of malignant neoplasm of cervix uteri: Secondary | ICD-10-CM | POA: Insufficient documentation

## 2012-12-31 DIAGNOSIS — Z87891 Personal history of nicotine dependence: Secondary | ICD-10-CM | POA: Insufficient documentation

## 2012-12-31 DIAGNOSIS — R918 Other nonspecific abnormal finding of lung field: Secondary | ICD-10-CM

## 2012-12-31 DIAGNOSIS — J449 Chronic obstructive pulmonary disease, unspecified: Secondary | ICD-10-CM | POA: Insufficient documentation

## 2012-12-31 DIAGNOSIS — Z923 Personal history of irradiation: Secondary | ICD-10-CM | POA: Insufficient documentation

## 2012-12-31 DIAGNOSIS — Z01818 Encounter for other preprocedural examination: Secondary | ICD-10-CM | POA: Insufficient documentation

## 2012-12-31 HISTORY — PX: VIDEO BRONCHOSCOPY WITH ENDOBRONCHIAL ULTRASOUND: SHX6177

## 2012-12-31 HISTORY — PX: VIDEO BRONCHOSCOPY WITH ENDOBRONCHIAL NAVIGATION: SHX6175

## 2012-12-31 SURGERY — VIDEO BRONCHOSCOPY WITH ENDOBRONCHIAL NAVIGATION
Anesthesia: General | Site: Bronchus | Wound class: Clean Contaminated

## 2012-12-31 MED ORDER — ONDANSETRON HCL 4 MG/2ML IJ SOLN: 4.0000 mg | Freq: Once | INTRAMUSCULAR | Status: DC | PRN

## 2012-12-31 MED ORDER — NEOSTIGMINE METHYLSULFATE 1 MG/ML IJ SOLN
INTRAMUSCULAR | Status: DC | PRN
  Administered 2012-12-31: 4 mg via INTRAVENOUS

## 2012-12-31 MED ORDER — HYDROMORPHONE HCL PF 1 MG/ML IJ SOLN
INTRAMUSCULAR | Status: AC
  Filled 2012-12-31: qty 1

## 2012-12-31 MED ORDER — LIDOCAINE HCL 4 % MT SOLN
OROMUCOSAL | Status: DC | PRN
  Administered 2012-12-31: 4 mL via TOPICAL

## 2012-12-31 MED ORDER — FENTANYL CITRATE 0.05 MG/ML IJ SOLN
INTRAMUSCULAR | Status: DC | PRN
  Administered 2012-12-31 (×2): 50 ug via INTRAVENOUS

## 2012-12-31 MED ORDER — ONDANSETRON HCL 4 MG/2ML IJ SOLN
INTRAMUSCULAR | Status: DC | PRN
  Administered 2012-12-31: 4 mg via INTRAVENOUS

## 2012-12-31 MED ORDER — 0.9 % SODIUM CHLORIDE (POUR BTL) OPTIME
TOPICAL | Status: DC | PRN
  Administered 2012-12-31: 1000 mL

## 2012-12-31 MED ORDER — PROPOFOL 10 MG/ML IV BOLUS
INTRAVENOUS | Status: DC | PRN
  Administered 2012-12-31: 130 mg via INTRAVENOUS

## 2012-12-31 MED ORDER — OXYCODONE HCL 5 MG PO TABS
5.0000 mg | ORAL_TABLET | Freq: Once | ORAL | Status: AC | PRN
  Administered 2012-12-31: 5 mg via ORAL

## 2012-12-31 MED ORDER — LACTATED RINGERS IV SOLN
INTRAVENOUS | Status: DC | PRN
  Administered 2012-12-31 (×3): via INTRAVENOUS

## 2012-12-31 MED ORDER — OXYCODONE HCL 5 MG/5ML PO SOLN: 5.0000 mg | Freq: Once | ORAL | Status: AC | PRN

## 2012-12-31 MED ORDER — ARTIFICIAL TEARS OP OINT
TOPICAL_OINTMENT | OPHTHALMIC | Status: DC | PRN
  Administered 2012-12-31: 1 via OPHTHALMIC

## 2012-12-31 MED ORDER — MIDAZOLAM HCL 5 MG/5ML IJ SOLN
INTRAMUSCULAR | Status: DC | PRN
  Administered 2012-12-31 (×2): 1 mg via INTRAVENOUS

## 2012-12-31 MED ORDER — GLYCOPYRROLATE 0.2 MG/ML IJ SOLN
INTRAMUSCULAR | Status: DC | PRN
  Administered 2012-12-31: .2 mg via INTRAVENOUS
  Administered 2012-12-31: .6 mg via INTRAVENOUS

## 2012-12-31 MED ORDER — PHENYLEPHRINE HCL 10 MG/ML IJ SOLN
INTRAMUSCULAR | Status: DC | PRN
  Administered 2012-12-31: 40 ug via INTRAVENOUS

## 2012-12-31 MED ORDER — OXYCODONE HCL 5 MG PO TABS
ORAL_TABLET | ORAL | Status: AC
  Filled 2012-12-31: qty 1

## 2012-12-31 MED ORDER — LACTATED RINGERS IV SOLN: INTRAVENOUS | Status: DC

## 2012-12-31 MED ORDER — HYDROMORPHONE HCL PF 1 MG/ML IJ SOLN
0.2500 mg | INTRAMUSCULAR | Status: DC | PRN
  Administered 2012-12-31: 0.5 mg via INTRAVENOUS

## 2012-12-31 MED ORDER — MEPERIDINE HCL 25 MG/ML IJ SOLN: 6.2500 mg | INTRAMUSCULAR | Status: DC | PRN

## 2012-12-31 MED ORDER — LIDOCAINE HCL (CARDIAC) 20 MG/ML IV SOLN
INTRAVENOUS | Status: DC | PRN
  Administered 2012-12-31: 80 mg via INTRAVENOUS

## 2012-12-31 MED ORDER — ROCURONIUM BROMIDE 100 MG/10ML IV SOLN
INTRAVENOUS | Status: DC | PRN
  Administered 2012-12-31: 30 mg via INTRAVENOUS

## 2012-12-31 MED ORDER — EPHEDRINE SULFATE 50 MG/ML IJ SOLN
INTRAMUSCULAR | Status: DC | PRN
  Administered 2012-12-31 (×3): 10 mg via INTRAVENOUS

## 2012-12-31 SURGICAL SUPPLY — 42 items
BALL CTTN LRG ABS STRL LF (GAUZE/BANDAGES/DRESSINGS)
BRUSH CYTOL CELLEBRITY 1.5X140 (MISCELLANEOUS) IMPLANT
BRUSH SUPERTRAX BIOPSY (INSTRUMENTS) ×3 IMPLANT
BRUSH SUPERTRAX NDL-TIP CYTO (INSTRUMENTS) IMPLANT
CANISTER SUCTION 2500CC (MISCELLANEOUS) ×5 IMPLANT
CHANNEL WORK EXTEND EDGE 180 (KITS) IMPLANT
CHANNEL WORK EXTEND EDGE 45 (KITS) IMPLANT
CHANNEL WORK EXTEND EDGE 90 (KITS) IMPLANT
CONT SPEC 4OZ CLIKSEAL STRL BL (MISCELLANEOUS) ×7 IMPLANT
COTTONBALL LRG STERILE PKG (GAUZE/BANDAGES/DRESSINGS) IMPLANT
COVER TABLE BACK 60X90 (DRAPES) ×6 IMPLANT
FILTER STRAW FLUID ASPIR (MISCELLANEOUS) IMPLANT
FORCEPS BIOP RJ4 1.8 (CUTTING FORCEPS) IMPLANT
FORCEPS BIOP SUPERTRX PREMAR (INSTRUMENTS) ×1 IMPLANT
GLOVE SURG SIGNA 7.5 PF LTX (GLOVE) ×6 IMPLANT
GOWN PREVENTION PLUS XLARGE (GOWN DISPOSABLE) ×6 IMPLANT
KIT PROCEDURE EDGE 180 (KITS) ×1 IMPLANT
KIT PROCEDURE EDGE 45 (KITS) IMPLANT
KIT PROCEDURE EDGE 90 (KITS) IMPLANT
KIT ROOM TURNOVER OR (KITS) ×3 IMPLANT
MARKER SKIN DUAL TIP RULER LAB (MISCELLANEOUS) ×4 IMPLANT
NDL BIOPSY TRANSBRONCH 21G (NEEDLE) IMPLANT
NDL BLUNT 18X1 FOR OR ONLY (NEEDLE) IMPLANT
NEEDLE 22X1 1/2 (OR ONLY) (NEEDLE) IMPLANT
NEEDLE BIOPSY TRANSBRONCH 21G (NEEDLE) IMPLANT
NEEDLE BLUNT 18X1 FOR OR ONLY (NEEDLE) IMPLANT
NEEDLE SUPERTRX PREMARK BIOPSY (NEEDLE) ×3 IMPLANT
NEEDLE SYS SONOTIP II EBUSTBNA (NEEDLE) ×3 IMPLANT
NS IRRIG 1000ML POUR BTL (IV SOLUTION) ×6 IMPLANT
OIL SILICONE PENTAX (PARTS (SERVICE/REPAIRS)) ×3 IMPLANT
PAD ARMBOARD 7.5X6 YLW CONV (MISCELLANEOUS) ×8 IMPLANT
PATCHES PATIENT (LABEL) ×9 IMPLANT
SPONGE GAUZE 4X4 12PLY (GAUZE/BANDAGES/DRESSINGS) ×3 IMPLANT
SYR 20CC LL (SYRINGE) ×6 IMPLANT
SYR 20ML ECCENTRIC (SYRINGE) ×6 IMPLANT
SYR 30ML LL (SYRINGE) ×2 IMPLANT
SYR 5ML LL (SYRINGE) ×4 IMPLANT
SYR 5ML LUER SLIP (SYRINGE) ×3 IMPLANT
SYR CONTROL 10ML LL (SYRINGE) IMPLANT
TOWEL OR 17X24 6PK STRL BLUE (TOWEL DISPOSABLE) ×6 IMPLANT
TRAP SPECIMEN MUCOUS 40CC (MISCELLANEOUS) ×6 IMPLANT
TUBE CONNECTING 12X1/4 (SUCTIONS) ×9 IMPLANT

## 2012-12-31 NOTE — Preoperative (Signed)
Beta Blockers   Reason not to administer Beta Blockers:Not Applicable 

## 2012-12-31 NOTE — Interval H&P Note (Signed)
History and Physical Interval Note:  12/31/2012 12:46 PM  [name]  has presented today for surgery, with the diagnosis of  RIGHT LUNG MASS  The various methods of treatment have been discussed with the patient and family. After consideration of risks, benefits and other options for treatment, the patient has consented to  Procedure(s): VIDEO BRONCHOSCOPY WITH ENDOBRONCHIAL NAVIGATION (N/A) VIDEO BRONCHOSCOPY WITH ENDOBRONCHIAL ULTRASOUND (N/A) as a surgical intervention .  The patient's history has been reviewed, patient examined, no change in status, stable for surgery.  I have reviewed the patient's chart and labs.  Questions were answered to the patient's satisfaction.     Markeis Allman C

## 2012-12-31 NOTE — H&P (View-Only) (Signed)
PCP is  Duane Lope, MD Referring Provider is Duane Lope, MD  No chief complaint on file.   HPI: 67 yo woman sent for consultation regarding a right lung mass.  Dawn Mercer is 67 yo woman with a history of tobacco abuse (1 ppd x 34 years, quit in 2000). She was in her usual state of health until a few weeks ago. She presented with a cc/o right sided rib pain after bending over. She says that she bent over awkwardly to pick up something she had dropped. She felt a sudden stabbing pain in the right side of her chest. She had a CXR, which showed a right lung mass. A CT was done and it confirmed a 1.7 cm right upper lobe mass and mild hilar and mediastinal adenopathy. There was a right sided rib fracture with no appreciable mass. A PET/CT was done and it showed hypermetabolic uptake in the right lung mass, the hilar and mediastinal lymph nodes and the rib fracture. The rib uptake was felt to be due to a healing fracture but a metastasis could not definitely be ruled out.  She is a former smoker. She says she has COPD, but only rarely uses her Pro-Air inhaler. She denies any cardiac history. Her only chest pain is associated with the right sided rib fracture. She is active.  ECOG/ZUBROD= 0  Past Medical History  Diagnosis Date  . 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 Status post radiation therapy 10/1999  . GERD (gastroesophageal reflux disease)   . Hypothyroidism     Past Surgical History  Procedure Laterality Date  . Laser ablation  04/2004    for VIN III    Family History  Problem Relation Age of Onset  . Heart disease Father   . Cervical cancer Sister   . Lung cancer Brother   . Heart disease Brother     Social History History  Substance Use Topics  . Smoking status: Former Smoker    Start date: 02/25/1998  . Smokeless tobacco: Not on file  . Alcohol Use: No  35 pack years  Current Outpatient  Prescriptions  Medication Sig Dispense Refill  . ALPRAZolam (XANAX) 0.5 MG tablet       . clidinium-chlordiazePOXIDE (LIBRAX) 2.5-5 MG per capsule Take 1 capsule by mouth as needed.      . diphenoxylate-atropine (LOMOTIL) 2.5-0.025 MG per tablet Take 1 tablet by mouth once a week. On Sunday      . Esomeprazole Magnesium (NEXIUM PO) Take by mouth every other day.      Marland Kitchen LEVOTHYROXINE SODIUM PO Take by mouth daily.      Marland Kitchen PROAIR HFA 108 (90 BASE) MCG/ACT inhaler       . simvastatin (ZOCOR) 40 MG tablet Take 40 mg by mouth every other day.      . traMADol (ULTRAM) 50 MG tablet        No current facility-administered medications for this visit.    Allergies  Allergen Reactions  . Codeine Nausea Only    Review of Systems  Constitutional: Negative for fever, appetite change and unexpected weight change.  Respiratory: Positive for cough and wheezing (rare).   Cardiovascular: Positive for chest pain (right lateral chest, NO anginal pain).  Endocrine:       Hypothyroidism- treated  All other systems reviewed and are negative.    There were no vitals taken for this visit. Physical Exam  Constitutional: She is oriented to person, place, and time. She appears well-developed and well-nourished. No distress.  HENT:  Head: Normocephalic and atraumatic.  Eyes: EOM are normal. Pupils are equal, round, and reactive to light.  Neck: Neck supple. No thyromegaly present.  Cardiovascular: Normal rate, regular rhythm, normal heart sounds and intact distal pulses.  Exam reveals no gallop and no friction rub.   No murmur heard. Pulmonary/Chest: Effort normal. She has no wheezes. She has no rales.  Abdominal: Soft. There is no tenderness.  Musculoskeletal: She exhibits no edema.  Lymphadenopathy:    She has no cervical adenopathy.  Neurological: She is alert and oriented to person, place, and time. No cranial nerve deficit.  No focal deficits  Skin: Skin is warm and dry.     Diagnostic  Tests: CT CHEST 11/17/2012 CT CHEST WITH CONTRAST  TECHNIQUE:  Multidetector CT imaging of the chest was performed during  intravenous contrast administration.  CONTRAST: 75mL OMNIPAQUE IOHEXOL 300 MG/ML SOLN  COMPARISON: Chest x-ray of 11/12/2012  FINDINGS:  The nodule questioned by chest x-ray in the mid right lung  represents a spiculated nodular lesion within the anterior inferior  right upper lobe measuring 1.6 x 1.7 x 1.6 cm, most consistent with  a primary lung carcinoma. Linear extension is noted both to the  pleura as well as more medially. PET-CT is recommended to assess  further. Diffuse changes of centrilobular emphysema are noted. No  additional lung nodule is seen and no infiltrate is present. There  is no evidence of a pleural effusion. Mild biapical pleural  parenchymal scarring is present. The central bronchi is patent.  On soft tissue window images, the thoracic aorta opacifies with  moderate atheromatous change present. The origins of the great  vessels appear patent. The pulmonary arteries opacify with no  significant abnormality noted. No mediastinal or hilar adenopathy is  seen although there is a single slightly prominent precarinal lymph  node with short axis diameter of 10 mm. Also, there is some  prominence of a sub carinal lymph node with a short axis diameter of  9 mm. PET-CT would be helpful to assess for metabolic activity. A  moderate amount of epicardial fat is present. The portion of the  abdomen that is visualized is unremarkable. No bony abnormality is  seen  IMPRESSION:  1. 1.6 x 1.7 x 1.6 cm spiculated lesion in the anterior inferior  right upper lobe most consistent with primary lung carcinoma.  Recommend PET-CT.  2. Slightly prominent mediastinal lymph nodes. Again PET-CT would be  helpful to assess for metabolic activity.  3. Diffuse centrilobular emphysema.  Electronically Signed  By: Dwyane Dee M.D.  On: 11/17/2012 15:23   PET/CT  12/17/2012 NUCLEAR MEDICINE PET SKULL BASE TO THIGH  FASTING BLOOD GLUCOSE: Value: 95 mg/dl  TECHNIQUE:  16.1 mCi W-96 FDG was injected intravenously. CT data was obtained  and used for attenuation correction and anatomic localization only.  (This was not acquired as a diagnostic CT examination.) Additional  exam technical data entered on technologist worksheet.  COMPARISON: CT chest dated 11/17/2012  FINDINGS:  Note: For technical reasons, the calculated SUVs are considered  spuriously high. As such, the study will be reported qualitatively.  NECK  No hypermetabolic lymph nodes in the neck.  CHEST  1.7 x 1.7 cm spiculated hypermetabolic right upper lobe pulmonary  nodule (series 2/ image 78), compatible with primary bronchogenic  neoplasm.  Underlying moderate centrilobular and paraseptal emphysematous  changes. No pleural effusion or  pneumothorax.  13 mm short axis hypermetabolic precarinal node (series 2/ image  72), suspicious for nodal metastasis. Additional focal  hypermetabolism in the right hilar region (PET image 75), suspicious  for nodal metastasis.  ABDOMEN/PELVIS  No abnormal hypermetabolic activity within the liver, pancreas,  adrenal glands, or spleen.  No hypermetabolic lymph nodes in the abdomen or pelvis.  Brachytherapy seeds in the region of the cervix.  SKELETON  Healing fracture deformity involving the right anterolateral 6th rib  (series 2/ image 106). Associated hypermetabolism on PET (PET image  104), without associated soft tissue mass on CT.  IMPRESSION:  Hypermetabolic 1.7 cm spiculated right upper lobe pulmonary nodule,  compatible with primary bronchogenic neoplasm.  Associated right hilar and precarinal nodal metastases.  Healing anterolateral right 6th rib fracture with associated  hypermetabolism, nonspecific. Pathologic fracture is not entirely  excluded, although there is no associated soft tissue mass on CT.  Correlate for traumatic history.   Electronically Signed  By: Charline Bills M.D.  On: 12/17/2012 17:07  Impression: 67 yo woman who presented after a right rib fracture nad has been found to have a right upper lobe mass. Workup including a CT and PET/CT show a 1.7 cm hypermetabolic RUL mass with hilar and mediastinal adenopathy. There is also a right 6th rib fracture. She most likely has a new lung cancer, likely stage IIIA, if not stage IV(rib). The rib fracture is a bit mysterious. There is no soft tissue mass or findings on CT to suggest a met. However, the relatively mild trauma is worrisome for a pathologic fracture.  I had a long discussion with Mrs. Penalver and her husband. They understand that this is highly likely to be lung cancer and most likely is at least regionally advanced if not widely metastatic. We need a tissue biopsy for confirmation prior to treatment.  The lung mass itself is amenable to CT guided biopsy but would not yield information about the mediastinal node. She is a low risk surgical patient. Therefore, in my opinion the best option is to do ENB and EBUS to be able to biopsy the primary mass and the mediastinal nodes at the same setting.  I discussed the procedure with the patient an her husband. They understand the need for general anesthesia, the endoscopic approach and the likelihood it can be done as an outpatient procedure. We discussed the indications, risks, benefits and alternatives. They understand the risks include, but are not limited to bleeding, pneumothorax, failure to make a diagnosis, as well as very improbable ones such as death, MI, DVT, PE, stroke. She accepts the risks and agrees to proceed.     Plan:

## 2012-12-31 NOTE — Anesthesia Preprocedure Evaluation (Signed)
Anesthesia Evaluation  Patient identified by MRN, date of birth, ID band Patient awake    Reviewed: Allergy & Precautions, H&P , NPO status , Patient's Chart, lab work & pertinent test results  Airway Mallampati: I TM Distance: >3 FB Neck ROM: Full    Dental   Pulmonary COPD         Cardiovascular     Neuro/Psych    GI/Hepatic GERD-  Controlled,  Endo/Other  Hypothyroidism   Renal/GU      Musculoskeletal   Abdominal   Peds  Hematology   Anesthesia Other Findings   Reproductive/Obstetrics                           Anesthesia Physical Anesthesia Plan  ASA: III  Anesthesia Plan: General   Post-op Pain Management:    Induction: Intravenous  Airway Management Planned: Oral ETT  Additional Equipment:   Intra-op Plan:   Post-operative Plan: Extubation in OR  Informed Consent: I have reviewed the patients History and Physical, chart, labs and discussed the procedure including the risks, benefits and alternatives for the proposed anesthesia with the patient or authorized representative who has indicated his/her understanding and acceptance.     Plan Discussed with: CRNA and Surgeon  Anesthesia Plan Comments:         Anesthesia Quick Evaluation

## 2012-12-31 NOTE — Anesthesia Postprocedure Evaluation (Signed)
Anesthesia Post Note  Patient: Dawn Mercer  Procedure(s) Performed: Procedure(s) (LRB): VIDEO BRONCHOSCOPY WITH ENDOBRONCHIAL NAVIGATION (N/A) VIDEO BRONCHOSCOPY WITH ENDOBRONCHIAL ULTRASOUND (N/A)  Anesthesia type: general  Patient location: PACU  Post pain: Pain level controlled  Post assessment: Patient's Cardiovascular Status Stable  Last Vitals:  Filed Vitals:   12/31/12 1057  BP: 122/62  Pulse: 76  Temp:   Resp: 15    Post vital signs: Reviewed and stable  Level of consciousness: sedated  Complications: No apparent anesthesia complications

## 2012-12-31 NOTE — Transfer of Care (Signed)
Immediate Anesthesia Transfer of Care Note  Patient: Dawn Mercer  Procedure(s) Performed: Procedure(s): VIDEO BRONCHOSCOPY WITH ENDOBRONCHIAL NAVIGATION (N/A) VIDEO BRONCHOSCOPY WITH ENDOBRONCHIAL ULTRASOUND (N/A)  Patient Location: PACU  Anesthesia Type:General  Level of Consciousness: awake, alert , oriented and patient cooperative  Airway & Oxygen Therapy: Patient connected to nasal cannula oxygen  Post-op Assessment: Report given to PACU RN, Post -op Vital signs reviewed and stable and Patient moving all extremities  Post vital signs: Reviewed and stable  Complications: No apparent anesthesia complications

## 2012-12-31 NOTE — Brief Op Note (Signed)
12/31/2012  10:36 AM  PATIENT:  [name]  67 y.o. female  PRE-OPERATIVE DIAGNOSIS:   RIGHT LUNG MASS  POST-OPERATIVE DIAGNOSIS:   RIGHT LUNG MASS  PROCEDURE:  Procedure(s): VIDEO BRONCHOSCOPY WITH ENDOBRONCHIAL NAVIGATION (N/A) VIDEO BRONCHOSCOPY WITH ENDOBRONCHIAL ULTRASOUND (N/A) ENB brushings and biopsies/ EBUS- mediastinal and hilar node aspirations  SURGEON:  Surgeon(s) and Role:    * Loreli Slot, MD - Primary   ANESTHESIA:   general  EBL:  Total I/O In: 1700 [I.V.:1700] Out: -   BLOOD ADMINISTERED:none  DRAINS: none   LOCAL MEDICATIONS USED:  NONE  SPECIMEN:  Source of Specimen:  RUL mass, 4R, 7, 10R lymph nodes  DISPOSITION OF SPECIMEN:  PATHOLOGY  PLAN OF CARE: Discharge to home after PACU  PATIENT DISPOSITION:  PACU - hemodynamically stable.   Delay start of Pharmacological VTE agent (>24hrs) due to surgical blood loss or risk of bleeding: not applicable

## 2012-12-31 NOTE — Anesthesia Procedure Notes (Signed)
Procedure Name: Intubation Date/Time: 12/31/2012 8:16 AM Performed by: Loreli Slot Pre-anesthesia Checklist: Patient identified, Patient being monitored, Emergency Drugs available, Timeout performed and Suction available Patient Re-evaluated:Patient Re-evaluated prior to inductionOxygen Delivery Method: Circle system utilized Preoxygenation: Pre-oxygenation with 100% oxygen Intubation Type: IV induction Ventilation: Mask ventilation without difficulty Laryngoscope Size: Mac and 3 Grade View: Grade I Tube type: Oral Tube size: 8.5 mm Number of attempts: 1 Airway Equipment and Method: Stylet Placement Confirmation: ETT inserted through vocal cords under direct vision,  breath sounds checked- equal and bilateral and positive ETCO2 Secured at: 23 cm Tube secured with: Tape Dental Injury: Teeth and Oropharynx as per pre-operative assessment  Comments: Smooth IV induction by Dr Ladene Artist; Easy atraumatic intubation by Pasty Arch CRNA.

## 2013-01-01 ENCOUNTER — Telehealth: Payer: Self-pay | Admitting: *Deleted

## 2013-01-01 ENCOUNTER — Encounter (HOSPITAL_COMMUNITY): Payer: Self-pay | Admitting: Thoracic Surgery (Cardiothoracic Vascular Surgery)

## 2013-01-01 NOTE — Telephone Encounter (Signed)
Spoke with pt regarding appt for [facility] next week.  She is aware she will see medical and radiation oncology.  She verbalized understanding of appt

## 2013-01-01 NOTE — Op Note (Signed)
NAME:  GISSELLA, NIBLACK             ACCOUNT NO.:  [id number]  MEDICAL RECORD NO.:  [id number]  LOCATION:  [facility]                         FACILITY:  [facility]  PHYSICIAN:  Salvatore Decent. Dorris Fetch, M.D.DATE OF BIRTH:  [birth year]  DATE OF PROCEDURE:  12/31/2012 DATE OF DISCHARGE:  12/31/2012                              OPERATIVE REPORT   PREOPERATIVE DIAGNOSIS:  Right lung mass with hilar and mediastinal adenopathy.  POSTOPERATIVE DIAGNOSIS:  Small cell lung cancer.  PROCEDURE:  Video bronchoscopy, endobronchial ultrasound with mediastinal nodes and hilar lymph node aspirations, electromagnetic navigational bronchoscopy with biopsies and brushings of right upper lobe mass.  SURGEON:  Salvatore Decent. Dorris Fetch, MD.  ASSISTANT:  None.  ANESTHESIA:  General.  FINDINGS:  Enlarged 4R and 7 lymph nodes, mildly enlarged 10R nodes. 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  CLINICAL NOTE:  Ms. Rosamond is a 67 year old woman with a history of tobacco abuse who presented with right-sided rib pain after bending over.  She had bent over awkwardly to pick up an object that she had dropped and suddenly had a stabbing pain on the right side of her chest.  A chest x-ray was done, which showed a right lung nodule.  This led to a CT which confirmed a 1.7 cm right upper lobe mass and also showed some mild hilar and mediastinal adenopathy.  A PET-CT was done which showed hypermetabolic uptake in the right lung mass as well as the hilar and mediastinal lymph nodes.  There also was uptake in the rib fracture, this was felt to be most likely due to a healing fracture.  She was advised to undergo endobronchial navigational bronchoscopy and endobronchial ultrasound, to sample of the primary tumor as well as the mediastinal lymph nodes to establish a diagnosis and plan treatment. She did understand that this was a diagnostic and not a therapeutic procedure.  OPERATIVE NOTE:  Ms. Mcclard  was brought to the operating room on December 31, 2012.  She was anesthetized and intubated.  PAS hose were placed for DVT prophylaxis.  Flexible fiberoptic bronchoscopy was performed via the endotracheal tube. It revealed normal endobronchial anatomy and no endobronchial lesions.  There were thick whitish secretions.    The endobronchial ultrasound probe was then passed and a systematic inspection of the mediastinal lymph nodes was carried out.  There was a significantly enlarged 4R lymph node, markedly enlarged level 7 lymph node, and mildly enlarged level 10R node adjacent to the takeoff of the right upper lobe bronchus.  Aspirations were then done of each of these nodes, 2 aspirations were taken from each node with ten passes of the needle on each aspiration.  This was done with ultrasound visualization.  The specimens were sent for both quick prep as well as cell block.  There was no significant bleeding with any of the aspirations.  While awaiting the results of the quick prep, the regular bronchoscope was placed and the locatable guide for the navigational bronchoscopy was placed through the bronchoscope.  The bronchoscope then was navigated to the origin of the right upper lobe bronchus and the locatable guide was advanced to within 1.5 cm of the mass.  Positioning of  the sheath was confirmed to be in the vicinity of the tumor by fluoroscopy.  Multiple brushings were taken.  The locatable guide was replaced to ensure that the catheter remained in the vicinity of the tumor and then biopsies were taken.  While the biopsies were being performed, results of the quick prep on the mediastinal lymph nodes was called back showing small cell in the 4R node.  Two more additional biopsies were taken and then the bronchoscope was removed.  The endobronchial ultrasound probe was passed into the endotracheal tube once again and 2 additional sets of samples were taken from the 4R node and  these would be sent for permanent cell block only.  The ultrasound probe then was removed.  The patient was extubated in the operating room and taken to the postanesthetic care unit in good condition.     Salvatore Decent Dorris Fetch, M.D.     SCH/MEDQ  D:  12/31/2012  T:  01/01/2013  Job:  [id number]

## 2013-01-04 ENCOUNTER — Telehealth: Payer: Self-pay | Admitting: *Deleted

## 2013-01-04 ENCOUNTER — Encounter: Payer: Self-pay | Admitting: *Deleted

## 2013-01-04 NOTE — Telephone Encounter (Signed)
Pt called to state her phone number has changed.  I corrected

## 2013-01-05 ENCOUNTER — Other Ambulatory Visit: Payer: Self-pay

## 2013-01-05 ENCOUNTER — Telehealth: Payer: Self-pay | Admitting: *Deleted

## 2013-01-05 DIAGNOSIS — D381 Neoplasm of uncertain behavior of trachea, bronchus and lung: Secondary | ICD-10-CM

## 2013-01-05 NOTE — Telephone Encounter (Signed)
[facility] called to inform me that biopsy was non-diagnostic.  I called pt to cancel appt for this week and re-schedule for next week.  She verbalized understanding.

## 2013-01-07 ENCOUNTER — Other Ambulatory Visit: Payer: Self-pay | Admitting: Radiology

## 2013-01-07 ENCOUNTER — Ambulatory Visit: Payer: Medicare Other | Admitting: Internal Medicine

## 2013-01-11 ENCOUNTER — Ambulatory Visit (HOSPITAL_COMMUNITY)
Admission: RE | Admit: 2013-01-11 | Discharge: 2013-01-11 | Disposition: A | Discharge status: Home / Self Care | Payer: Medicare Other | Source: Ambulatory Visit | Attending: Thoracic Surgery (Cardiothoracic Vascular Surgery) | Admitting: Thoracic Surgery (Cardiothoracic Vascular Surgery)

## 2013-01-11 ENCOUNTER — Encounter (HOSPITAL_COMMUNITY): Payer: Self-pay | Admitting: Pharmacy Technician

## 2013-01-11 ENCOUNTER — Encounter (HOSPITAL_COMMUNITY): Payer: Self-pay

## 2013-01-11 ENCOUNTER — Telehealth: Payer: Self-pay | Admitting: *Deleted

## 2013-01-11 DIAGNOSIS — Z87891 Personal history of nicotine dependence: Secondary | ICD-10-CM | POA: Insufficient documentation

## 2013-01-11 DIAGNOSIS — C3410 Malignant neoplasm of upper lobe, unspecified bronchus or lung: Secondary | ICD-10-CM | POA: Insufficient documentation

## 2013-01-11 DIAGNOSIS — Z01812 Encounter for preprocedural laboratory examination: Secondary | ICD-10-CM | POA: Insufficient documentation

## 2013-01-11 DIAGNOSIS — E039 Hypothyroidism, unspecified: Secondary | ICD-10-CM | POA: Insufficient documentation

## 2013-01-11 DIAGNOSIS — K589 Irritable bowel syndrome without diarrhea: Secondary | ICD-10-CM | POA: Insufficient documentation

## 2013-01-11 DIAGNOSIS — D381 Neoplasm of uncertain behavior of trachea, bronchus and lung: Secondary | ICD-10-CM

## 2013-01-11 DIAGNOSIS — K219 Gastro-esophageal reflux disease without esophagitis: Secondary | ICD-10-CM | POA: Insufficient documentation

## 2013-01-11 DIAGNOSIS — J449 Chronic obstructive pulmonary disease, unspecified: Secondary | ICD-10-CM | POA: Insufficient documentation

## 2013-01-11 DIAGNOSIS — J4489 Other specified chronic obstructive pulmonary disease: Secondary | ICD-10-CM | POA: Insufficient documentation

## 2013-01-11 LAB — CBC
HCT: 44.3 % (ref 36.0–46.0)
Hemoglobin: 15 g/dL (ref 12.0–15.0)
MCHC: 33.9 g/dL (ref 30.0–36.0)
Platelets: 243 10*3/uL (ref 150–400)
RDW: 15.2 % (ref 11.5–15.5)
WBC: 7.9 10*3/uL (ref 4.0–10.5)

## 2013-01-11 LAB — PROTIME-INR
INR: 0.93 (ref 0.00–1.49)
Prothrombin Time: 12.3 seconds (ref 11.6–15.2)

## 2013-01-11 LAB — APTT: aPTT: 28 seconds (ref 24–37)

## 2013-01-11 MED ORDER — FENTANYL CITRATE 0.05 MG/ML IJ SOLN
INTRAMUSCULAR | Status: AC
  Filled 2013-01-11: qty 4

## 2013-01-11 MED ORDER — MIDAZOLAM HCL 2 MG/2ML IJ SOLN
INTRAMUSCULAR | Status: AC | PRN
  Administered 2013-01-11: 1 mg via INTRAVENOUS
  Administered 2013-01-11 (×2): 0.5 mg via INTRAVENOUS

## 2013-01-11 MED ORDER — LIDOCAINE HCL 1 % IJ SOLN
INTRAMUSCULAR | Status: AC
  Filled 2013-01-11: qty 10

## 2013-01-11 MED ORDER — FENTANYL CITRATE 0.05 MG/ML IJ SOLN
INTRAMUSCULAR | Status: AC | PRN
  Administered 2013-01-11 (×3): 50 ug via INTRAVENOUS

## 2013-01-11 MED ORDER — MIDAZOLAM HCL 2 MG/2ML IJ SOLN
INTRAMUSCULAR | Status: AC
  Filled 2013-01-11: qty 4

## 2013-01-11 MED ORDER — SODIUM CHLORIDE 0.9 % IV SOLN: Freq: Once | INTRAVENOUS | Status: DC

## 2013-01-11 NOTE — Procedures (Signed)
Procedure:  CT guided core biopsy of RUL pulmonary nodule Findings:  17 G nodule advanced to edge of RUL nodule.  18 G core biopsy performed x 2.  Tiny amount of adjacent pleural air post biopsy.  Will follow with CXR in 2 hours.

## 2013-01-11 NOTE — Telephone Encounter (Signed)
Pt called left vm message regarding appt with [facility].  I called her back and left vm message to call when she could

## 2013-01-11 NOTE — H&P (Signed)
Dawn Mercer is an 67 y.o. female.   Chief Complaint: developed Rt chest pain after stooping to pick up something from floor Work up also revealed Rt lung mass CT shows Rt lung mass and mediastinal and hilar LAN +PET Bronch 11/6 was non diagnostic Scheduled now for needle biopsy of Rt Lung mass HPI: Cervical ca; vulvar ca; GERD; COPD; prev smoker- quit 2000  Past Medical History  Diagnosis Date  . 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 Status post radiation therapy 10/1999  . GERD (gastroesophageal reflux disease)   . Hypothyroidism   . IBS (irritable bowel syndrome)     Hx: of  . COPD (chronic obstructive pulmonary disease)   . Shortness of breath     Hx: of with exertion  . Bronchitis     Hx: of  . Hay fever     Hx: of  . Seizures     Hx: of over 40 years ago  . H/O hiatal hernia     Past Surgical History  Procedure Laterality Date  . Laser ablation  04/2004    for VIN III  . Colonoscopy w/ biopsies and polypectomy      Hx: of  . Tubal ligation    . Video bronchoscopy with endobronchial navigation N/A 12/31/2012    Procedure: VIDEO BRONCHOSCOPY WITH ENDOBRONCHIAL NAVIGATION;  Surgeon: Loreli Slot, MD;  Location: [facility] OR;  Service: Thoracic;  Laterality: N/A;  . Video bronchoscopy with endobronchial ultrasound N/A 12/31/2012    Procedure: VIDEO BRONCHOSCOPY WITH ENDOBRONCHIAL ULTRASOUND;  Surgeon: Loreli Slot, MD;  Location: [facility] OR;  Service: Thoracic;  Laterality: N/A;    Family History  Problem Relation Age of Onset  . Heart disease Father   . Cervical cancer Sister   . Lung cancer Brother   . Heart disease Brother   . Cancer Brother     small cell lung cancer   Social History:  reports that she has quit smoking. She started smoking about 14 years ago. She has never used smokeless tobacco. She reports that she does not drink alcohol or use illicit  drugs.  Allergies:  Allergies  Allergen Reactions  . Codeine Nausea Only     (Not in a hospital admission)  No results found for this or any previous visit (from the past 48 hour(s)). No results found.  Review of Systems  Constitutional: Negative for fever, chills and weight loss.  Respiratory: Negative for cough and wheezing.   Cardiovascular: Positive for chest pain.       Rt  Gastrointestinal: Negative for nausea, vomiting and abdominal pain.    Blood pressure 147/75, pulse 68, temperature 97.3 F (36.3 C), temperature source Oral, resp. rate 18, height 5\' 6"  (1.676 m), weight 192 lb (87.091 kg), SpO2 93.00%. Physical Exam  Constitutional: She is oriented to person, place, and time. She appears well-developed.  Cardiovascular: Normal rate, regular rhythm and normal heart sounds.   No murmur heard. Respiratory: Effort normal. She has no wheezes.  GI: Soft. Bowel sounds are normal. There is no tenderness.  Musculoskeletal: Normal range of motion.  Neurological: She is alert and oriented to person, place, and time.  Psychiatric: She has a normal mood and affect. Her behavior is normal. Judgment and thought content normal.     Assessment/Plan Hx cervical and vulvar ca Recent dx with Rt lung mass; mediastinal and hilar LAN Lung mass +PET Bronch 11/6  non diagnostic Scheduled for needle bx of R lung mass Pt aware of procedure benefits and risks and agreeable to proceed' Consent signed and in chart   Grayden Burley A 01/11/2013, 10:30 AM

## 2013-01-11 NOTE — H&P (Signed)
Agree.  For CT guided biopsy of RUL lung nodule today.

## 2013-01-14 ENCOUNTER — Ambulatory Visit (HOSPITAL_BASED_OUTPATIENT_CLINIC_OR_DEPARTMENT_OTHER): Payer: Medicare Other | Admitting: Internal Medicine

## 2013-01-14 ENCOUNTER — Encounter: Payer: Self-pay | Admitting: *Deleted

## 2013-01-14 ENCOUNTER — Encounter: Payer: Self-pay | Admitting: Radiation Oncology

## 2013-01-14 ENCOUNTER — Telehealth: Payer: Self-pay | Admitting: Internal Medicine

## 2013-01-14 ENCOUNTER — Ambulatory Visit
Admission: RE | Admit: 2013-01-14 | Discharge: 2013-01-14 | Disposition: A | Discharge status: Home / Self Care | Payer: Medicare Other | Source: Ambulatory Visit | Attending: Radiation Oncology | Admitting: Radiation Oncology

## 2013-01-14 ENCOUNTER — Encounter: Payer: Self-pay | Admitting: Internal Medicine

## 2013-01-14 VITALS — BP 150/57 | HR 70 | Temp 98.1°F | Resp 17 | Ht 66.0 in | Wt 190.6 lb

## 2013-01-14 DIAGNOSIS — F411 Generalized anxiety disorder: Secondary | ICD-10-CM

## 2013-01-14 DIAGNOSIS — C3410 Malignant neoplasm of upper lobe, unspecified bronchus or lung: Secondary | ICD-10-CM

## 2013-01-14 DIAGNOSIS — C771 Secondary and unspecified malignant neoplasm of intrathoracic lymph nodes: Secondary | ICD-10-CM

## 2013-01-14 DIAGNOSIS — C3491 Malignant neoplasm of unspecified part of right bronchus or lung: Secondary | ICD-10-CM

## 2013-01-14 MED ORDER — ALPRAZOLAM 0.5 MG PO TABS: 0.2500 mg | ORAL_TABLET | Freq: Two times a day (BID) | ORAL | Status: DC | PRN

## 2013-01-14 MED ORDER — PROCHLORPERAZINE MALEATE 10 MG PO TABS: 10.0000 mg | ORAL_TABLET | Freq: Four times a day (QID) | ORAL | Status: DC | PRN

## 2013-01-14 NOTE — Progress Notes (Signed)
[facility] [phone]   [phone] Multidisciplinary thoracic oncology clinic (MTOC)  CONSULT NOTE  REFERRING PHYSICIAN: Dr. Charlett Lango  REASON FOR CONSULTATION:  67 years old white female recently diagnosed with lung cancer.  HPI VASILIA DISE is a 67 y.o. female with past medical history significant for cervical cancer diagnosed in 2000 status post radiotherapy under the care of Dr. Roselind Messier, GERD, hypothyroidism and remote history of smoking but quit in 2000. The patient was seen by her primary care physician complaining of right sided hip pain. She had x-ray of the right ribs performed on 11/12/2012 and it showed 1.5 CM lung nodule on the right. This was followed by CT scan of the chest on 11/17/2012 and it showed 1.6 x 1.7 x 1.6 cm spiculated lesion in the anterior inferior right upper lobe most consistent with primary lung carcinoma. There was also slightly prominent mediastinal lymph nodes. The patient was referred to Dr. Dorris Fetch and he ordered a PET scan which was performed on 12/17/2012 and it showed 1.7 x 1.7 cm spiculated hypermetabolic right upper lobe pulmonary  nodule, compatible with primary bronchogenic neoplasm. Associated right hilar and precarinal nodal metastases. Healing anterolateral right 6th rib fracture with associated  hypermetabolism, nonspecific. Pathologic fracture is not entirely excluded, although there is no associated soft tissue mass on CT. Correlate for traumatic history. On 11/30/2012 the patient underwent Video bronchoscopy, endobronchial ultrasound with mediastinal nodes and hilar lymph node aspirations, electromagnetic navigational bronchoscopy with biopsies and brushings of right upper lobe mass. The right upper lobe lung biopsy showed a focus of atypical cells. The patient was referred to interventional radiology for CT guided core biopsy of the right upper lobe lung nodule. The final pathology (Accession:  [id number]) showed squamous cell carcinoma. The core biopsies are involved by poorly differentiated non-small cell carcinoma. The tumor is positive by immunohistochemistry with p63 and negative with Thyroid Transcription Factor-1 (TTF-1). The morphologic and immunohistochemical findings are consistent with squamous cell carcinoma.  Dr. Dorris Fetch kindly referred the patient to me today for evaluation and recommendation regarding treatment of her condition. The patient is very anxious and complains of fatigue. She also lost around 4 pounds recently. She has shortness breath with exertion as well as occasional tightness in her chest. The patient also has cough productive of yellowish sputum but no fever or chills. She denied having any visual changes or headache. She denied having any nausea, vomiting, diarrhea or constipation. Family history significant for a father who died from heart attack, mother died at age 5 from old age. The patient has a sister with cervical cancer and brother with small cell lung cancer.  She is married and has no children. She was accompanied today by her husband Dorinda Hill. The patient is a retired Film/video editor. She has a history of smoking one pack per day for around 20 years and quit in 2000. She denied having any history of alcohol or drug abuse be     HPI  Past Medical History  Diagnosis Date  . 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 Status post radiation therapy 10/1999  . GERD (gastroesophageal reflux disease)   . Hypothyroidism   . IBS (irritable bowel syndrome)     Hx: of  . COPD (chronic obstructive pulmonary disease)   . Shortness of breath     Hx: of with exertion  . Bronchitis  Hx: of  . Hay fever     Hx: of  . Seizures     Hx: of over 40 years ago  . H/O hiatal hernia     Past Surgical History  Procedure Laterality Date  . Laser ablation  04/2004    for VIN III    . Colonoscopy w/ biopsies and polypectomy      Hx: of  . Tubal ligation    . Video bronchoscopy with endobronchial navigation N/A 12/31/2012    Procedure: VIDEO BRONCHOSCOPY WITH ENDOBRONCHIAL NAVIGATION;  Surgeon: Loreli Slot, MD;  Location: [facility] OR;  Service: Thoracic;  Laterality: N/A;  . Video bronchoscopy with endobronchial ultrasound N/A 12/31/2012    Procedure: VIDEO BRONCHOSCOPY WITH ENDOBRONCHIAL ULTRASOUND;  Surgeon: Loreli Slot, MD;  Location: [facility] OR;  Service: Thoracic;  Laterality: N/A;    Family History  Problem Relation Age of Onset  . Heart disease Father   . Cervical cancer Sister   . Lung cancer Brother   . Heart disease Brother   . Cancer Brother     small cell lung cancer    Social History History  Substance Use Topics  . Smoking status: Former Smoker -- 1.00 packs/day for 34 years    Start date: 02/25/1998    Quit date: 02/25/1998  . Smokeless tobacco: Never Used  . Alcohol Use: No    Allergies  Allergen Reactions  . Codeine Nausea Only    Current Outpatient Prescriptions  Medication Sig Dispense Refill  . albuterol (PROVENTIL HFA;VENTOLIN HFA) 108 (90 BASE) MCG/ACT inhaler Inhale 1 puff into the lungs every 6 (six) hours as needed for wheezing.      . Cholecalciferol (VITAMIN D-3) 5000 UNITS TABS Take 5,000 Units by mouth 2 (two) times a week.      . clidinium-chlordiazePOXIDE (LIBRAX) 5-2.5 MG per capsule Take 1 capsule by mouth daily as needed (for upset stomach).      . diphenoxylate-atropine (LOMOTIL) 2.5-0.025 MG per tablet Take 1 tablet by mouth every Sunday.      . esomeprazole (NEXIUM) 40 MG capsule Take 40 mg by mouth daily before breakfast.      . levothyroxine (SYNTHROID, LEVOTHROID) 112 MCG tablet Take 112 mcg by mouth daily before breakfast.      . traMADol (ULTRAM) 50 MG tablet Take 50 mg by mouth every 6 (six) hours as needed for pain.      Marland Kitchen ALPRAZolam (XANAX) 0.5 MG tablet Take 0.5 tablets (0.25 mg total) by mouth 2  (two) times daily as needed for anxiety.  60 tablet  0  . prochlorperazine (COMPAZINE) 10 MG tablet Take 1 tablet (10 mg total) by mouth every 6 (six) hours as needed for nausea or vomiting.  60 tablet  0   No current facility-administered medications for this visit.    Review of Systems  Constitutional: positive for fatigue Eyes: negative Ears, nose, mouth, throat, and face: negative Respiratory: positive for cough, dyspnea on exertion, pleurisy/chest pain and sputum Cardiovascular: negative Gastrointestinal: negative Genitourinary:negative Integument/breast: negative Hematologic/lymphatic: negative Musculoskeletal:negative Neurological: negative Behavioral/Psych: negative Endocrine: negative Allergic/Immunologic: negative  Physical Exam  ZOX:WRUEA, healthy, no distress, well nourished, well developed and anxious SKIN: skin color, texture, turgor are normal, no rashes or significant lesions HEAD: Normocephalic, No masses, lesions, tenderness or abnormalities EYES: normal, PERRLA EARS: External ears normal, Canals clear OROPHARYNX:no exudate, no erythema and lips, buccal mucosa, and tongue normal  NECK: supple, no adenopathy, no JVD LYMPH:  no palpable lymphadenopathy, no hepatosplenomegaly BREAST:not  examined LUNGS: clear to auscultation , and palpation HEART: regular rate & rhythm, no murmurs and no gallops ABDOMEN:abdomen soft, non-tender, normal bowel sounds and no masses or organomegaly BACK: Back symmetric, no curvature., No CVA tenderness EXTREMITIES:no joint deformities, effusion, or inflammation, no edema, no skin discoloration  NEURO: alert & oriented x 3 with fluent speech, no focal motor/sensory deficits  PERFORMANCE STATUS: ECOG 1  LABORATORY DATA: Lab Results  Component Value Date   WBC 7.9 01/11/2013   HGB 15.0 01/11/2013   HCT 44.3 01/11/2013   MCV 90.2 01/11/2013   PLT 243 01/11/2013      Chemistry      Component Value Date/Time   NA 138  12/30/2012 1335   K 4.1 12/30/2012 1335   CL 101 12/30/2012 1335   CO2 28 12/30/2012 1335   BUN 8 12/30/2012 1335   CREATININE 0.86 12/30/2012 1335      Component Value Date/Time   CALCIUM 9.2 12/30/2012 1335   ALKPHOS 89 12/30/2012 1335   AST 20 12/30/2012 1335   ALT 17 12/30/2012 1335   BILITOT 0.7 12/30/2012 1335       RADIOGRAPHIC STUDIES: Dg Chest 2 View  12/30/2012   CLINICAL DATA:  Lung mass, preoperative radiograph.  EXAM: CHEST  2 VIEW  COMPARISON:  CT chest 12/17/2012  FINDINGS: There is a 17 mm right upper lobe pulmonary nodule. There is no other focal parenchymal opacity. No pleural effusion or pneumothorax. The lungs are hyperinflated likely secondary to COPD. Normal heart size.  IMPRESSION: 17 mm right upper lobe pulmonary nodule.   Electronically Signed   By: Elige Ko   On: 12/30/2012 15:12   Nm Pet Image Initial (pi) Skull Base To Thigh  12/17/2012   CLINICAL DATA:  Initial treatment strategy for right lung nodule.  EXAM: NUCLEAR MEDICINE PET SKULL BASE TO THIGH  FASTING BLOOD GLUCOSE:  Value:  95 mg/dl  TECHNIQUE: 04.5 mCi W-09 FDG was injected intravenously. CT data was obtained and used for attenuation correction and anatomic localization only. (This was not acquired as a diagnostic CT examination.) Additional exam technical data entered on technologist worksheet.  COMPARISON:  CT chest dated 11/17/2012  FINDINGS: Note: For technical reasons, the calculated SUVs are considered spuriously high. As such, the study will be reported qualitatively.  NECK  No hypermetabolic lymph nodes in the neck.  CHEST  1.7 x 1.7 cm spiculated hypermetabolic right upper lobe pulmonary nodule (series 2/ image 78), compatible with primary bronchogenic neoplasm.  Underlying moderate centrilobular and paraseptal emphysematous changes. No pleural effusion or pneumothorax.  13 mm short axis hypermetabolic precarinal node (series 2/ image 72), suspicious for nodal metastasis. Additional focal hypermetabolism  in the right hilar region (PET image 75), suspicious for nodal metastasis.  ABDOMEN/PELVIS  No abnormal hypermetabolic activity within the liver, pancreas, adrenal glands, or spleen.  No hypermetabolic lymph nodes in the abdomen or pelvis.  Brachytherapy seeds in the region of the cervix.  SKELETON  Healing fracture deformity involving the right anterolateral 6th rib (series 2/ image 106). Associated hypermetabolism on PET (PET image 104), without associated soft tissue mass on CT.  IMPRESSION: Hypermetabolic 1.7 cm spiculated right upper lobe pulmonary nodule, compatible with primary bronchogenic neoplasm.  Associated right hilar and precarinal nodal metastases.  Healing anterolateral right 6th rib fracture with associated hypermetabolism, nonspecific. Pathologic fracture is not entirely excluded, although there is no associated soft tissue mass on CT. Correlate for traumatic history.   Electronically Signed   By: Lurlean Horns  Rito Ehrlich M.D.   On: 12/17/2012 17:07   Ct Biopsy  01/11/2013   CLINICAL DATA:  1.7 cm hypermetabolic right upper lobe lung nodule. Previous bronchoscopy demonstrated a single cluster of atypical cells which was not adequate for definitive diagnosis. The patient has been referred for percutaneous biopsy.  EXAM: CT GUIDED CORE BIOPSY OF RIGHT LUNG  ANESTHESIA/SEDATION: 2.0  Mg IV Versed; 150 mcg IV Fentanyl  Total Moderate Sedation Time: 32 minutes.  PROCEDURE: The procedure risks, benefits, and alternatives were explained to the patient. Questions regarding the procedure were encouraged and answered. The patient understands and consents to the procedure.  The right anterior chest wall was prepped with Betadinein a sterile fashion, and a sterile drape was applied covering the operative field. A sterile gown and sterile gloves were used for the procedure. Local anesthesia was provided with 1% Lidocaine.  Localizing CT was performed in a supine position. Under CT guidance, a 17 gauge trocar  needle was advanced to the margin of the right upper lobe lung nodule. After confirming needle tip position, a total of 2 separate 18 gauge core biopsy samples were obtained. Post biopsy CT was performed after the outer needle removal.  Complications: Tiny amount of pleural air.  FINDINGS: Imaging demonstrates the anterior right upper lobe nodule measuring roughly 1.7 cm in greatest diameter. Antral lateral approach was performed. Core biopsy yielded solid tissue. During the procedure, a small amount of adjacent pleural air became evident. This will be followed by a chest x-ray 2 hr after the procedure.  IMPRESSION: CT-guided core biopsy performed of a 1.7 cm right upper lobe lung nodule. Solid tissue was obtained. Tiny pleural air/pneumothorax was present on biopsy completion which will be followed by chest x-ray.   Electronically Signed   By: Irish Lack M.D.   On: 01/11/2013 13:23   Dg Chest Port 1 View  01/11/2013   CLINICAL DATA:  Status post lung biopsy.  EXAM: PORTABLE CHEST - 1 VIEW  COMPARISON:  12/30/2012.  FINDINGS: No postprocedural pneumothorax is identified. The heart and lungs are stable.  IMPRESSION: No postprocedural pneumothorax.   Electronically Signed   By: Loralie Champagne M.D.   On: 01/11/2013 15:30   Dg C-arm Bronchoscopy  12/31/2012   CLINICAL DATA: intraop   C-ARM BRONCHOSCOPY  Fluoroscopy was utilized by the requesting physician.  No radiographic  interpretation.     ASSESSMENT: This is a very pleasant 67 years old white female recently diagnosed with a stage IIIA (T1b, N2, M0) non-small cell lung cancer, squamous cell carcinoma presented with a right upper lobe lung nodule in addition to mediastinal lymphadenopathy, diagnosed in November of 2014.Marland Kitchen  PLAN: I had a lengthy discussion with the patient and her husband today about her current disease stage, prognosis and treatment options. I will complete the staging workup by ordering MRI of the brain to rule out brain  metastasis. I recommended for the patient treatment with a course of concurrent chemoradiation with weekly carboplatin for AUC of 2 and paclitaxel 45 mg/M2 for a total of 6-7 weeks concurrent with radiation. After completion of this course of concurrent chemoradiation, the patient will have restaging scans and if she has good response with downgrading of the N2 disease, we may consider referring her back to Dr. Dorris Fetch for surgical resection. I discussed with the patient adverse effect of the chemotherapy including but not limited to alopecia, myelosuppression, nausea and vomiting, peripheral neuropathy, liver or renal dysfunction. I will arrange for the patient to have a  chemotherapy education class before starting the first cycle of her treatment. She is expected to start the first treatment on 01/25/2013. I would called her pharmacy with prescription for Compazine 10 mg by mouth every 6 hours as needed for nausea in addition to Ativan 1 mg by mouth 30 minutes before MRI of the brain because of her anxiety and claustrophobia. The patient would come back for follow up visit in 2 weeks for reevaluation and management any adverse effect of her chemotherapy. She was seen by several members of the multidisciplinary thoracic oncology team today including Dr. Roselind Messier (Radiation oncologist), social worker, thoracic navigator as well as a member of the palliative care team.  The patient voices understanding of current disease status and treatment options and is in agreement with the current care plan.  All questions were answered. The patient knows to call the clinic with any problems, questions or concerns. We can certainly see the patient much sooner if necessary.  Thank you so much for allowing me to participate in the care of [name]. I will continue to follow up the patient with you and assist in her care.  I spent 55 minutes counseling the patient face to face. The total time spent in the  appointment was 70 minutes.  Buddy Loeffelholz K. 01/14/2013, 3:20 PM

## 2013-01-14 NOTE — Progress Notes (Signed)
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 female who is seen out of the courtesy of Dr. Gwendel Hanson for an opinion concerning radiation therapy as part of management of patient's recently diagnosed non-small cell lung cancer. The patient presented with some discomfort along the right chest area after turning to her side. This ultimately prompted x-ray which revealed a small spiculated lesion in the right upper lung area. Patient did undergo PET scan which is outlined below and showing activity in the right upper lobe nodule as well as the right hilar and precarinal region. The patient was taken to the operating room and underwent video bronchoscopy with endobronchial navigation. Tissue from this procedure was nondiagnostic. Patient subsequently underwent a  right upper lung biopsy by CT guidance. This did reveal squamous cell carcinoma. With the above findings the patient was not felt to be a good candidate for surgical intervention. She is now seen in radiation oncology for consideration for combined modality therapy.  PREVIOUS RADIATION THERAPY: Yes,  the patient was diagnosed with early stage cervical cancer. She underwent definitive treatment with radiation as well as intracavitary brachytherapy treatment using cesium under my direction at [place].  Details are pending at this time from her previous treatment.  She was followed for several years with no recurrence.  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    PAST SURGICAL HISTORY: Past Surgical History  Procedure Laterality Date  . Laser ablation  04/2004    for VIN III  . Colonoscopy w/ biopsies and polypectomy      Hx: of  . Tubal ligation    . Video bronchoscopy with endobronchial navigation N/A 12/31/2012    Procedure: VIDEO BRONCHOSCOPY WITH ENDOBRONCHIAL NAVIGATION;  Surgeon: Loreli Slot, MD;  Location: [facility] OR;  Service: Thoracic;  Laterality: N/A;  . Video bronchoscopy with endobronchial ultrasound N/A 12/31/2012    Procedure: VIDEO BRONCHOSCOPY WITH ENDOBRONCHIAL ULTRASOUND;  Surgeon: Loreli Slot, MD;  Location: [facility] OR;  Service: Thoracic;  Laterality: N/A;    FAMILY HISTORY: family history includes Cancer in her brother; Cervical cancer in her sister; Heart disease in her brother and father; Lung cancer in her brother.  SOCIAL HISTORY:  reports that she quit smoking about 14 years ago. She started smoking about 14 years ago. She has never used smokeless tobacco. She reports that she does not drink alcohol or use illicit drugs.  ALLERGIES: Codeine  MEDICATIONS:  Current Outpatient Prescriptions  Medication Sig Dispense Refill  . albuterol (PROVENTIL HFA;VENTOLIN HFA) 108 (90 BASE) MCG/ACT inhaler Inhale 1 puff into the lungs every 6 (six) hours as needed for wheezing.      Marland Kitchen ALPRAZolam (XANAX) 0.5 MG tablet Take 0.5 tablets (0.25 mg total) by mouth 2 (two) times daily as needed for anxiety.  60 tablet  0  . Cholecalciferol (VITAMIN D-3)  5000 UNITS TABS Take 5,000 Units by mouth 2 (two) times a week.      . clidinium-chlordiazePOXIDE (LIBRAX) 5-2.5 MG per capsule Take 1 capsule by mouth daily as needed (for upset stomach).      .  diphenoxylate-atropine (LOMOTIL) 2.5-0.025 MG per tablet Take 1 tablet by mouth every Sunday.      . esomeprazole (NEXIUM) 40 MG capsule Take 40 mg by mouth daily before breakfast.      . levothyroxine (SYNTHROID, LEVOTHROID) 112 MCG tablet Take 112 mcg by mouth daily before breakfast.      . traMADol (ULTRAM) 50 MG tablet Take 50 mg by mouth every 6 (six) hours as needed for pain.       No current facility-administered medications for this encounter.    REVIEW OF SYSTEMS:  A 15 point review of systems is documented in the electronic medical record. This was obtained by the nursing staff. However, I reviewed this with the patient to discuss relevant findings and make appropriate changes.  She denies any headaches dizziness or blurred vision. Patient denies any new bony pain. She denies any significant cough or hemoptysis. She denies any pain in the chest area.  She has had some mild sputum production since her bronchoscopy procedure. She denies any chills or fever.   PHYSICAL EXAM: Vital signs weight 190 pounds height 5 feet 6 inches BMI 30.8 blood pressure 150/57 pulse 70 respirations 17 temperature 98.1 oxygen saturation 93% on room air This is a very pleasant 67 year old female in no acute distress.  She is accompanied by her husband on evaluation today. She is well known to our department after undergoing definitive radiation therapy for cervical cancer.  The patient is alert and oriented. She is somewhat anxious today.  Examination of the pupils reveals them to be equal round reactive to light. The extraocular eye movements are intact. The tongue is midline. The patient has dentures in place. There is no secondary infection noted in the oral cavity or posterior pharynx. Examination of the neck and supraclavicular region reveals no evidence of adenopathy. The axillary areas are free of adenopathy. Examination of the lungs reveals them to be clear. The heart has a regular rhythm and rate.  the abdomen  reveals it to be soft and nontender with normal bowel sounds. There is no obvious hepatosplenomegaly. On neurological examination motor strength is 5 out of 5 in the proximal and distal muscle groups in the upper lower extremities. Cranial nerves II through XII are intact.   ECOG =0   LABORATORY DATA:  Lab Results  Component Value Date   WBC 7.9 01/11/2013   HGB 15.0 01/11/2013   HCT 44.3 01/11/2013   MCV 90.2 01/11/2013   PLT 243 01/11/2013   Lab Results  Component Value Date   NA 138 12/30/2012   K 4.1 12/30/2012   CL 101 12/30/2012   CO2 28 12/30/2012   Lab Results  Component Value Date   ALT 17 12/30/2012   AST 20 12/30/2012   ALKPHOS 89 12/30/2012   BILITOT 0.7 12/30/2012     RADIOGRAPHY: Dg Chest 2 View  12/30/2012   CLINICAL DATA:  Lung mass, preoperative radiograph.  EXAM: CHEST  2 VIEW  COMPARISON:  CT chest 12/17/2012  FINDINGS: There is a 17 mm right upper lobe pulmonary nodule. There is no other focal parenchymal opacity. No pleural effusion or pneumothorax. The lungs are hyperinflated likely secondary to COPD. Normal heart size.  IMPRESSION: 17 mm right upper lobe pulmonary  nodule.   Electronically Signed   By: Elige Ko   On: 12/30/2012 15:12   Nm Pet Image Initial (pi) Skull Base To Thigh  12/17/2012   CLINICAL DATA:  Initial treatment strategy for right lung nodule.  EXAM: NUCLEAR MEDICINE PET SKULL BASE TO THIGH  FASTING BLOOD GLUCOSE:  Value:  95 mg/dl  TECHNIQUE: 16.1 mCi W-96 FDG was injected intravenously. CT data was obtained and used for attenuation correction and anatomic localization only. (This was not acquired as a diagnostic CT examination.) Additional exam technical data entered on technologist worksheet.  COMPARISON:  CT chest dated 11/17/2012  FINDINGS: Note: For technical reasons, the calculated SUVs are considered spuriously high. As such, the study will be reported qualitatively.  NECK  No hypermetabolic lymph nodes in the neck.  CHEST  1.7 x 1.7 cm  spiculated hypermetabolic right upper lobe pulmonary nodule (series 2/ image 78), compatible with primary bronchogenic neoplasm.  Underlying moderate centrilobular and paraseptal emphysematous changes. No pleural effusion or pneumothorax.  13 mm short axis hypermetabolic precarinal node (series 2/ image 72), suspicious for nodal metastasis. Additional focal hypermetabolism in the right hilar region (PET image 75), suspicious for nodal metastasis.  ABDOMEN/PELVIS  No abnormal hypermetabolic activity within the liver, pancreas, adrenal glands, or spleen.  No hypermetabolic lymph nodes in the abdomen or pelvis.  Brachytherapy seeds in the region of the cervix.  SKELETON  Healing fracture deformity involving the right anterolateral 6th rib (series 2/ image 106). Associated hypermetabolism on PET (PET image 104), without associated soft tissue mass on CT.  IMPRESSION: Hypermetabolic 1.7 cm spiculated right upper lobe pulmonary nodule, compatible with primary bronchogenic neoplasm.  Associated right hilar and precarinal nodal metastases.  Healing anterolateral right 6th rib fracture with associated hypermetabolism, nonspecific. Pathologic fracture is not entirely excluded, although there is no associated soft tissue mass on CT. Correlate for traumatic history.   Electronically Signed   By: Charline Bills M.D.   On: 12/17/2012 17:07   Ct Biopsy  01/11/2013   CLINICAL DATA:  1.7 cm hypermetabolic right upper lobe lung nodule. Previous bronchoscopy demonstrated a single cluster of atypical cells which was not adequate for definitive diagnosis. The patient has been referred for percutaneous biopsy.  EXAM: CT GUIDED CORE BIOPSY OF RIGHT LUNG  ANESTHESIA/SEDATION: 2.0  Mg IV Versed; 150 mcg IV Fentanyl  Total Moderate Sedation Time: 32 minutes.  PROCEDURE: The procedure risks, benefits, and alternatives were explained to the patient. Questions regarding the procedure were encouraged and answered. The patient understands  and consents to the procedure.  The right anterior chest wall was prepped with Betadinein a sterile fashion, and a sterile drape was applied covering the operative field. A sterile gown and sterile gloves were used for the procedure. Local anesthesia was provided with 1% Lidocaine.  Localizing CT was performed in a supine position. Under CT guidance, a 17 gauge trocar needle was advanced to the margin of the right upper lobe lung nodule. After confirming needle tip position, a total of 2 separate 18 gauge core biopsy samples were obtained. Post biopsy CT was performed after the outer needle removal.  Complications: Tiny amount of pleural air.  FINDINGS: Imaging demonstrates the anterior right upper lobe nodule measuring roughly 1.7 cm in greatest diameter. Antral lateral approach was performed. Core biopsy yielded solid tissue. During the procedure, a small amount of adjacent pleural air became evident. This will be followed by a chest x-ray 2 hr after the procedure.  IMPRESSION: CT-guided core biopsy  performed of a 1.7 cm right upper lobe lung nodule. Solid tissue was obtained. Tiny pleural air/pneumothorax was present on biopsy completion which will be followed by chest x-ray.   Electronically Signed   By: Irish Lack M.D.   On: 01/11/2013 13:23   Dg Chest Port 1 View  01/11/2013   CLINICAL DATA:  Status post lung biopsy.  EXAM: PORTABLE CHEST - 1 VIEW  COMPARISON:  12/30/2012.  FINDINGS: No postprocedural pneumothorax is identified. The heart and lungs are stable.  IMPRESSION: No postprocedural pneumothorax.   Electronically Signed   By: Loralie Champagne M.D.   On: 01/11/2013 15:30   Dg C-arm Bronchoscopy  12/31/2012   CLINICAL DATA: intraop   C-ARM BRONCHOSCOPY  Fluoroscopy was utilized by the requesting physician.  No radiographic  interpretation.       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  The patient would be a good candidate for a  definitive course of radiation along with radiosensitizing chemotherapy. Her performance status is excellent at this time. I discussed the treatment course side effects and potential toxicities of radiation therapy in this situation with the patient and her husband. Patient appears to understand and wishes to proceed with planned course of treatment. To complete the patient's staging workup she will be scheduled for an MRI of the brain  PLAN: Simulation and planning on November 24 at 1 PM.  She will likely begin her combined treatment the first week in December. Anticipate approximately 7 weeks of radiation therapy as part of her overall management.  I spent 60 minutes minutes face to face with the patient and more than 50% of that time was spent in counseling and/or coordination of care.   ------------------------------------------------  -----------------------------------  Billie Lade, PhD, MD

## 2013-01-14 NOTE — Telephone Encounter (Signed)
gv and printed appt sched and avs for pt for NOV adn DEC...emailed MW to add tx 12.1.14 adn sed added tx after

## 2013-01-14 NOTE — Progress Notes (Signed)
[facility]  Clinical Social Work  Clinical Social Work met with patient and patient's spouse at [facility] for assessment of psychosocial needs and to offer support. Dawn Mercer shared that she is ready to begin treatment and is agreeable to the plan. She states she currently trusts "God and her doctor to take care of this".  CSW provided brief support and shared CSW role at cancer center and [facility] support services. CSW encouraged patient/family to call with any questions or concerns.   Kathrin Penner, MSW, LCSW  Clinical Social Worker  [facility]  [phone]

## 2013-01-14 NOTE — Addendum Note (Signed)
Encounter addended by: Billie Lade, MD on: 01/14/2013  3:08 PM<BR>     Documentation filed: Follow-up Section, LOS Section, Clinical Notes

## 2013-01-15 ENCOUNTER — Telehealth: Payer: Self-pay | Admitting: *Deleted

## 2013-01-15 ENCOUNTER — Other Ambulatory Visit: Payer: Self-pay | Admitting: Internal Medicine

## 2013-01-15 ENCOUNTER — Telehealth: Payer: Self-pay | Admitting: Medical Oncology

## 2013-01-15 MED ORDER — LORAZEPAM 1 MG PO TABS: ORAL_TABLET | ORAL | Status: DC

## 2013-01-15 NOTE — Telephone Encounter (Signed)
Called pt to update on Rad Onc appt for Monday 01/19/13 at 12:45. She verbalized understand of time and place of appt.    Pt also had questions about her nausea medication.  I explained and answered all question.    Dawn Mercer is in good spirits and ready to start treatment.

## 2013-01-15 NOTE — Telephone Encounter (Signed)
Per staff message and POF I have scheduled appts. Advised scheduler to move lab appt JMW  

## 2013-01-15 NOTE — Telephone Encounter (Signed)
Called in ativan 1 tablet per order.

## 2013-01-16 NOTE — Patient Instructions (Signed)
We discussed the treatment options including concurrent chemoradiation.  First dose on 01/25/2013. Follow up visit in 2 weeks

## 2013-01-18 ENCOUNTER — Other Ambulatory Visit: Payer: Medicare Other

## 2013-01-18 ENCOUNTER — Other Ambulatory Visit: Payer: Self-pay | Admitting: *Deleted

## 2013-01-18 ENCOUNTER — Telehealth: Payer: Self-pay | Admitting: Internal Medicine

## 2013-01-18 ENCOUNTER — Ambulatory Visit
Admission: RE | Admit: 2013-01-18 | Discharge: 2013-01-18 | Disposition: A | Discharge status: Home / Self Care | Payer: Medicare Other | Source: Ambulatory Visit | Attending: Radiation Oncology | Admitting: Radiation Oncology

## 2013-01-18 DIAGNOSIS — L259 Unspecified contact dermatitis, unspecified cause: Secondary | ICD-10-CM | POA: Insufficient documentation

## 2013-01-18 DIAGNOSIS — C3410 Malignant neoplasm of upper lobe, unspecified bronchus or lung: Secondary | ICD-10-CM | POA: Insufficient documentation

## 2013-01-18 DIAGNOSIS — R05 Cough: Secondary | ICD-10-CM | POA: Insufficient documentation

## 2013-01-18 DIAGNOSIS — K229 Disease of esophagus, unspecified: Secondary | ICD-10-CM | POA: Insufficient documentation

## 2013-01-18 DIAGNOSIS — Z79899 Other long term (current) drug therapy: Secondary | ICD-10-CM | POA: Insufficient documentation

## 2013-01-18 DIAGNOSIS — R197 Diarrhea, unspecified: Secondary | ICD-10-CM | POA: Insufficient documentation

## 2013-01-18 DIAGNOSIS — Z510 Encounter for antineoplastic radiation therapy: Secondary | ICD-10-CM | POA: Insufficient documentation

## 2013-01-18 DIAGNOSIS — R059 Cough, unspecified: Secondary | ICD-10-CM | POA: Insufficient documentation

## 2013-01-18 MED ORDER — LIDOCAINE-PRILOCAINE 2.5-2.5 % EX CREA: 1.0000 "application " | TOPICAL_CREAM | CUTANEOUS | Status: DC | PRN

## 2013-01-18 NOTE — Telephone Encounter (Signed)
sw,. pt andadvised on d/t change on 12.1.14... pt ok and aware...pt requested mail...mailed pt avs and letter

## 2013-01-18 NOTE — Progress Notes (Signed)
Pt is requesting port placement due to poor venous access.  Per Dr Donnald Garre, okay to have port placed.  Orders sent to IR and schedulers to process.  SLJ

## 2013-01-18 NOTE — Telephone Encounter (Signed)
s.w IR tech adn advised on port placement...they will contact pt with appt.

## 2013-01-19 ENCOUNTER — Other Ambulatory Visit: Payer: Self-pay | Admitting: Radiology

## 2013-01-19 ENCOUNTER — Encounter (HOSPITAL_COMMUNITY): Payer: Self-pay | Admitting: Pharmacy Technician

## 2013-01-20 ENCOUNTER — Ambulatory Visit
Admission: RE | Admit: 2013-01-20 | Discharge: 2013-01-20 | Disposition: A | Discharge status: Home / Self Care | Payer: Medicare Other | Source: Ambulatory Visit | Attending: Internal Medicine | Admitting: Internal Medicine

## 2013-01-20 ENCOUNTER — Ambulatory Visit (HOSPITAL_COMMUNITY)
Admission: RE | Admit: 2013-01-20 | Discharge: 2013-01-20 | Disposition: A | Discharge status: Home / Self Care | Payer: Medicare Other | Source: Ambulatory Visit | Attending: Internal Medicine | Admitting: Internal Medicine

## 2013-01-20 ENCOUNTER — Other Ambulatory Visit: Payer: Self-pay | Admitting: Internal Medicine

## 2013-01-20 ENCOUNTER — Encounter (HOSPITAL_COMMUNITY): Payer: Self-pay

## 2013-01-20 DIAGNOSIS — E039 Hypothyroidism, unspecified: Secondary | ICD-10-CM | POA: Insufficient documentation

## 2013-01-20 DIAGNOSIS — J449 Chronic obstructive pulmonary disease, unspecified: Secondary | ICD-10-CM | POA: Insufficient documentation

## 2013-01-20 DIAGNOSIS — J4489 Other specified chronic obstructive pulmonary disease: Secondary | ICD-10-CM | POA: Insufficient documentation

## 2013-01-20 DIAGNOSIS — K589 Irritable bowel syndrome without diarrhea: Secondary | ICD-10-CM | POA: Insufficient documentation

## 2013-01-20 DIAGNOSIS — Z87891 Personal history of nicotine dependence: Secondary | ICD-10-CM | POA: Insufficient documentation

## 2013-01-20 DIAGNOSIS — K219 Gastro-esophageal reflux disease without esophagitis: Secondary | ICD-10-CM | POA: Insufficient documentation

## 2013-01-20 DIAGNOSIS — C3490 Malignant neoplasm of unspecified part of unspecified bronchus or lung: Secondary | ICD-10-CM | POA: Insufficient documentation

## 2013-01-20 DIAGNOSIS — C3491 Malignant neoplasm of unspecified part of right bronchus or lung: Secondary | ICD-10-CM

## 2013-01-20 LAB — CBC
HCT: 44.7 % (ref 36.0–46.0)
Hemoglobin: 14.8 g/dL (ref 12.0–15.0)
MCH: 29.2 pg (ref 26.0–34.0)
RBC: 5.07 MIL/uL (ref 3.87–5.11)
RDW: 14.7 % (ref 11.5–15.5)
WBC: 7.4 10*3/uL (ref 4.0–10.5)

## 2013-01-20 MED ORDER — CEFAZOLIN SODIUM-DEXTROSE 2-3 GM-% IV SOLR
2.0000 g | INTRAVENOUS | Status: AC
  Administered 2013-01-20: 2 g via INTRAVENOUS
  Filled 2013-01-20: qty 50

## 2013-01-20 MED ORDER — SODIUM CHLORIDE 0.9 % IV SOLN
INTRAVENOUS | Status: DC
  Administered 2013-01-20: 09:00:00 via INTRAVENOUS

## 2013-01-20 MED ORDER — HEPARIN SOD (PORK) LOCK FLUSH 100 UNIT/ML IV SOLN
500.0000 [IU] | Freq: Once | INTRAVENOUS | Status: AC
  Administered 2013-01-20: 500 [IU] via INTRAVENOUS

## 2013-01-20 MED ORDER — MIDAZOLAM HCL 2 MG/2ML IJ SOLN
INTRAMUSCULAR | Status: AC
  Filled 2013-01-20: qty 4

## 2013-01-20 MED ORDER — GADOBENATE DIMEGLUMINE 529 MG/ML IV SOLN
18.0000 mL | Freq: Once | INTRAVENOUS | Status: AC | PRN
  Administered 2013-01-20: 18 mL via INTRAVENOUS

## 2013-01-20 MED ORDER — FENTANYL CITRATE 0.05 MG/ML IJ SOLN
INTRAMUSCULAR | Status: AC | PRN
  Administered 2013-01-20: 100 ug via INTRAVENOUS

## 2013-01-20 MED ORDER — FENTANYL CITRATE 0.05 MG/ML IJ SOLN
INTRAMUSCULAR | Status: AC
  Filled 2013-01-20: qty 4

## 2013-01-20 MED ORDER — MIDAZOLAM HCL 2 MG/2ML IJ SOLN
INTRAMUSCULAR | Status: AC | PRN
  Administered 2013-01-20: 1 mg via INTRAVENOUS
  Administered 2013-01-20: 2 mg via INTRAVENOUS

## 2013-01-20 NOTE — Procedures (Signed)
R IJ Port cathter placement with US and fluoroscopy No complication No blood loss. See complete dictation in [facility] PACS.  

## 2013-01-20 NOTE — H&P (Signed)
Dawn Mercer is an 67 y.o. female.   Chief Complaint: Scheduled for Port a cath placement Pt with new diagnosis + squamous cell lung ca Lung mass bx 01/11/13 Needs PAC for access and chemo therapy   HPI: Hx cervical ca; IBS; GERD; COPD; Sz  Past Medical History  Diagnosis Date  . 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 Status post radiation therapy 10/1999  . GERD (gastroesophageal reflux disease)   . Hypothyroidism   . IBS (irritable bowel syndrome)     Hx: of  . COPD (chronic obstructive pulmonary disease)   . Shortness of breath     Hx: of with exertion  . Bronchitis     Hx: of  . Hay fever     Hx: of  . Seizures     Hx: of over 40 years ago  . H/O hiatal hernia     Past Surgical History  Procedure Laterality Date  . Laser ablation  04/2004    for VIN III  . Colonoscopy w/ biopsies and polypectomy      Hx: of  . Tubal ligation    . Video bronchoscopy with endobronchial navigation N/A 12/31/2012    Procedure: VIDEO BRONCHOSCOPY WITH ENDOBRONCHIAL NAVIGATION;  Surgeon: Loreli Slot, MD;  Location: [facility] OR;  Service: Thoracic;  Laterality: N/A;  . Video bronchoscopy with endobronchial ultrasound N/A 12/31/2012    Procedure: VIDEO BRONCHOSCOPY WITH ENDOBRONCHIAL ULTRASOUND;  Surgeon: Loreli Slot, MD;  Location: [facility] OR;  Service: Thoracic;  Laterality: N/A;    Family History  Problem Relation Age of Onset  . Heart disease Father   . Cervical cancer Sister   . Lung cancer Brother   . Heart disease Brother   . Cancer Brother     small cell lung cancer   Social History:  reports that she quit smoking about 14 years ago. She started smoking about 14 years ago. She has never used smokeless tobacco. She reports that she does not drink alcohol or use illicit drugs.  Allergies:  Allergies  Allergen Reactions  . Codeine Nausea Only     (Not in a hospital  admission)  Results for orders placed during the hospital encounter of 01/20/13 (from the past 48 hour(s))  CBC     Status: None   Collection Time    01/20/13  9:15 AM      Result Value Range   WBC 7.4  4.0 - 10.5 K/uL   RBC 5.07  3.87 - 5.11 MIL/uL   Hemoglobin 14.8  12.0 - 15.0 g/dL   HCT 13.2  44.0 - 10.2 %   MCV 88.2  78.0 - 100.0 fL   MCH 29.2  26.0 - 34.0 pg   MCHC 33.1  30.0 - 36.0 g/dL   RDW 72.5  36.6 - 44.0 %   Platelets 271  150 - 400 K/uL   No results found.  Review of Systems  Constitutional: Negative for fever and weight loss.  Respiratory: Negative for shortness of breath.   Cardiovascular: Negative for chest pain.  Gastrointestinal: Negative for nausea and vomiting.  Neurological: Negative for weakness.    Blood pressure 148/76, pulse 66, temperature 98.1 F (36.7 C), temperature source Oral, resp. rate 18, SpO2 91.00%. Physical Exam  Constitutional: She is oriented to person, place, and time. She appears well-nourished.  Cardiovascular: Normal rate, regular rhythm and normal heart sounds.   No murmur  heard. Respiratory: Effort normal and breath sounds normal. She has no wheezes.  GI: Soft. Bowel sounds are normal. There is no tenderness.  Musculoskeletal: Normal range of motion.  Neurological: She is alert and oriented to person, place, and time.  Skin: Skin is warm and dry.  Psychiatric: She has a normal mood and affect. Her behavior is normal. Judgment and thought content normal.     Assessment/Plan Recent dx lung ca Scheduled for [facility] today Pt aware of procedure benefits and risks and agreeable to proceed Consent signed and in chart  Jaivon Vanbeek A 01/20/2013, 10:22 AM

## 2013-01-20 NOTE — Progress Notes (Signed)
Patient was discharged from [facility] at [phone]. She is stable at discharge. Patient was accompanied by nursing to lobby.

## 2013-01-25 ENCOUNTER — Other Ambulatory Visit: Payer: Self-pay | Admitting: Internal Medicine

## 2013-01-25 ENCOUNTER — Other Ambulatory Visit: Payer: Medicare Other | Admitting: Lab

## 2013-01-25 ENCOUNTER — Other Ambulatory Visit (HOSPITAL_BASED_OUTPATIENT_CLINIC_OR_DEPARTMENT_OTHER): Payer: Medicare Other

## 2013-01-25 ENCOUNTER — Ambulatory Visit (HOSPITAL_BASED_OUTPATIENT_CLINIC_OR_DEPARTMENT_OTHER): Payer: Medicare Other

## 2013-01-25 DIAGNOSIS — C3491 Malignant neoplasm of unspecified part of right bronchus or lung: Secondary | ICD-10-CM

## 2013-01-25 DIAGNOSIS — C3410 Malignant neoplasm of upper lobe, unspecified bronchus or lung: Secondary | ICD-10-CM

## 2013-01-25 DIAGNOSIS — Z5111 Encounter for antineoplastic chemotherapy: Secondary | ICD-10-CM

## 2013-01-25 LAB — CBC WITH DIFFERENTIAL/PLATELET
Basophils Absolute: 0 10*3/uL (ref 0.0–0.1)
EOS%: 3.2 % (ref 0.0–7.0)
Eosinophils Absolute: 0.3 10*3/uL (ref 0.0–0.5)
HCT: 45.4 % (ref 34.8–46.6)
HGB: 14.7 g/dL (ref 11.6–15.9)
MCH: 29.1 pg (ref 25.1–34.0)
MCV: 89.9 fL (ref 79.5–101.0)
MONO%: 8 % (ref 0.0–14.0)
NEUT#: 5.5 10*3/uL (ref 1.5–6.5)
RDW: 15.2 % — ABNORMAL HIGH (ref 11.2–14.5)
nRBC: 0 % (ref 0–0)

## 2013-01-25 LAB — COMPREHENSIVE METABOLIC PANEL (CC13)
ALT: 16 U/L (ref 0–55)
Albumin: 3.5 g/dL (ref 3.5–5.0)
Anion Gap: 9 mEq/L (ref 3–11)
CO2: 28 mEq/L (ref 22–29)
Calcium: 9.5 mg/dL (ref 8.4–10.4)
Glucose: 77 mg/dl (ref 70–140)
Potassium: 4.2 mEq/L (ref 3.5–5.1)
Sodium: 140 mEq/L (ref 136–145)
Total Bilirubin: 0.78 mg/dL (ref 0.20–1.20)
Total Protein: 7.5 g/dL (ref 6.4–8.3)

## 2013-01-25 MED ORDER — DEXAMETHASONE SODIUM PHOSPHATE 20 MG/5ML IJ SOLN
INTRAMUSCULAR | Status: AC
  Filled 2013-01-25: qty 5

## 2013-01-25 MED ORDER — PACLITAXEL CHEMO INJECTION 300 MG/50ML
45.0000 mg/m2 | Freq: Once | INTRAVENOUS | Status: AC
  Administered 2013-01-25: 90 mg via INTRAVENOUS
  Filled 2013-01-25: qty 15

## 2013-01-25 MED ORDER — SODIUM CHLORIDE 0.9 % IJ SOLN
10.0000 mL | INTRAMUSCULAR | Status: DC | PRN
  Administered 2013-01-25: 10 mL
  Filled 2013-01-25: qty 10

## 2013-01-25 MED ORDER — DIPHENHYDRAMINE HCL 50 MG/ML IJ SOLN
50.0000 mg | Freq: Once | INTRAMUSCULAR | Status: AC
  Administered 2013-01-25: 50 mg via INTRAVENOUS

## 2013-01-25 MED ORDER — HEPARIN SOD (PORK) LOCK FLUSH 100 UNIT/ML IV SOLN
500.0000 [IU] | Freq: Once | INTRAVENOUS | Status: AC | PRN
  Administered 2013-01-25: 500 [IU]
  Filled 2013-01-25: qty 5

## 2013-01-25 MED ORDER — DEXAMETHASONE SODIUM PHOSPHATE 20 MG/5ML IJ SOLN
20.0000 mg | Freq: Once | INTRAMUSCULAR | Status: AC
  Administered 2013-01-25: 20 mg via INTRAVENOUS

## 2013-01-25 MED ORDER — FAMOTIDINE IN NACL 20-0.9 MG/50ML-% IV SOLN
20.0000 mg | Freq: Once | INTRAVENOUS | Status: AC
  Administered 2013-01-25: 20 mg via INTRAVENOUS

## 2013-01-25 MED ORDER — ONDANSETRON 16 MG/50ML IVPB (CHCC)
16.0000 mg | Freq: Once | INTRAVENOUS | Status: AC
  Administered 2013-01-25: 16 mg via INTRAVENOUS

## 2013-01-25 MED ORDER — DIPHENHYDRAMINE HCL 50 MG/ML IJ SOLN
INTRAMUSCULAR | Status: AC
  Filled 2013-01-25: qty 1

## 2013-01-25 MED ORDER — FAMOTIDINE IN NACL 20-0.9 MG/50ML-% IV SOLN
INTRAVENOUS | Status: AC
  Filled 2013-01-25: qty 50

## 2013-01-25 MED ORDER — SODIUM CHLORIDE 0.9 % IV SOLN
201.2000 mg | Freq: Once | INTRAVENOUS | Status: AC
  Administered 2013-01-25: 200 mg via INTRAVENOUS
  Filled 2013-01-25: qty 20

## 2013-01-25 MED ORDER — ONDANSETRON 16 MG/50ML IVPB (CHCC)
INTRAVENOUS | Status: AC
  Filled 2013-01-25: qty 16

## 2013-01-25 MED ORDER — SODIUM CHLORIDE 0.9 % IV SOLN
Freq: Once | INTRAVENOUS | Status: AC
  Administered 2013-01-25: 14:00:00 via INTRAVENOUS

## 2013-01-25 NOTE — Patient Instructions (Signed)
[facility] Discharge Instructions for Patients Receiving Chemotherapy  Today you received the following chemotherapy agents: Taxol and Carboplatin.  To help prevent nausea and vomiting after your treatment, we encourage you to take your nausea medication as prescribed.   If you develop nausea and vomiting that is not controlled by your nausea medication, call the clinic.   BELOW ARE SYMPTOMS THAT SHOULD BE REPORTED IMMEDIATELY:  *FEVER GREATER THAN 100.5 F  *CHILLS WITH OR WITHOUT FEVER  NAUSEA AND VOMITING THAT IS NOT CONTROLLED WITH YOUR NAUSEA MEDICATION  *UNUSUAL SHORTNESS OF BREATH  *UNUSUAL BRUISING OR BLEEDING  TENDERNESS IN MOUTH AND THROAT WITH OR WITHOUT PRESENCE OF ULCERS  *URINARY PROBLEMS  *BOWEL PROBLEMS  UNUSUAL RASH Items with * indicate a potential emergency and should be followed up as soon as possible.  Feel free to call the clinic you have any questions or concerns. The clinic phone number is [phone].    

## 2013-01-25 NOTE — Progress Notes (Signed)
Patient tolerated first chemo of Taxol and Carboplatin. No reaction.

## 2013-01-26 ENCOUNTER — Telehealth: Payer: Self-pay | Admitting: *Deleted

## 2013-01-26 NOTE — Progress Notes (Signed)
  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  Identity was confirmed.  All relevant records and images related to the planned course of therapy were reviewed.  The patient freely provided informed written consent to proceed with treatment after reviewing the details related to the planned course of therapy. The consent form was witnessed and verified by the simulation staff.  Then, the patient was set-up in a stable reproducible  supine position for radiation therapy.  CT images were obtained.  Surface markings were placed.  The CT images were loaded into the planning software.  Then the target and avoidance structures were contoured.  Treatment planning then occurred.  The radiation prescription was entered and confirmed.  Then, I designed and supervised the construction of a total of 1 medically necessary complex treatment devices.  I have requested : 3D Simulation  I have requested a DVH of the following structures: GTV, PTV, lungs, spinal cord, esophagus.  I have ordered:dose calc.  PLAN:  The patient will receive 63 Gy in 35 fractions.  ________________________________   Special treatment procedure note    The patient will be receiving radiosensitizing chemotherapy. Given the increased potential for toxicities as well as the necessity for close monitoring of the patient and bloodwork, this constitutes a special treatment procedure. -----------------------------------  Billie Lade, PhD, MD

## 2013-01-26 NOTE — Telephone Encounter (Signed)
Called pt at home for post chemo follow up call.  Left message on voice mail for pt to call triage nurse back. 

## 2013-01-27 ENCOUNTER — Ambulatory Visit
Admission: RE | Admit: 2013-01-27 | Discharge: 2013-01-27 | Disposition: A | Discharge status: Home / Self Care | Payer: Medicare Other | Source: Ambulatory Visit | Attending: Radiation Oncology | Admitting: Radiation Oncology

## 2013-01-27 NOTE — Progress Notes (Signed)
  Radiation Oncology         [phone]) [phone] ________________________________  Name: Dawn Mercer MRN: [id number]  Date: 01/27/2013  DOB: Sep 09, 1945  Simulation Verification Note  Status: outpatient  NARRATIVE: The patient was brought to the treatment unit and placed in the planned treatment position. The clinical setup was verified. Then port films were obtained and uploaded to the radiation oncology medical record software.  The treatment beams were carefully compared against the planned radiation fields. The position location and shape of the radiation fields was reviewed. They targeted volume of tissue appears to be appropriately covered by the radiation beams. Organs at risk appear to be excluded as planned.  Based on my personal review, I approved the simulation verification. The patient's treatment will proceed as planned.  -----------------------------------  Billie Lade, PhD, MD

## 2013-01-28 ENCOUNTER — Ambulatory Visit
Admission: RE | Admit: 2013-01-28 | Discharge: 2013-01-28 | Disposition: A | Discharge status: Home / Self Care | Payer: Medicare Other | Source: Ambulatory Visit | Attending: Radiation Oncology | Admitting: Radiation Oncology

## 2013-01-29 ENCOUNTER — Ambulatory Visit
Admission: RE | Admit: 2013-01-29 | Discharge: 2013-01-29 | Disposition: A | Discharge status: Home / Self Care | Payer: Medicare Other | Source: Ambulatory Visit | Attending: Radiation Oncology | Admitting: Radiation Oncology

## 2013-01-29 MED ORDER — RADIAPLEXRX EX GEL
Freq: Once | CUTANEOUS | Status: AC
  Administered 2013-01-29: 16:00:00 via TOPICAL

## 2013-01-29 NOTE — Progress Notes (Signed)
Veatrice Kells here for post sim education.  She was given the Radiation Therapy and You book and discussed the potential side effects including pain, fatigue, skin irritation and throat changes.  She was given radiaplex gel and was instructed to apply it after treatment and at bedtime.  She was educated about under treat day with Dr. Roselind Messier on Monday.  She was provided with my business card and was advised that she can call with questions or concerns.

## 2013-01-29 NOTE — Addendum Note (Signed)
Encounter addended by: Eduardo Osier, RN on: 01/29/2013  3:55 PM<BR>     Documentation filed: Inpatient MAR

## 2013-02-01 ENCOUNTER — Encounter: Payer: Self-pay | Admitting: Internal Medicine

## 2013-02-01 ENCOUNTER — Ambulatory Visit
Admission: RE | Admit: 2013-02-01 | Discharge: 2013-02-01 | Disposition: A | Discharge status: Home / Self Care | Payer: Medicare Other | Source: Ambulatory Visit | Attending: Radiation Oncology | Admitting: Radiation Oncology

## 2013-02-01 ENCOUNTER — Encounter: Payer: Self-pay | Admitting: *Deleted

## 2013-02-01 ENCOUNTER — Other Ambulatory Visit (HOSPITAL_BASED_OUTPATIENT_CLINIC_OR_DEPARTMENT_OTHER): Payer: Medicare Other | Admitting: Lab

## 2013-02-01 ENCOUNTER — Telehealth: Payer: Self-pay | Admitting: Internal Medicine

## 2013-02-01 ENCOUNTER — Encounter (INDEPENDENT_AMBULATORY_CARE_PROVIDER_SITE_OTHER): Payer: Self-pay

## 2013-02-01 ENCOUNTER — Ambulatory Visit (HOSPITAL_BASED_OUTPATIENT_CLINIC_OR_DEPARTMENT_OTHER): Payer: Medicare Other | Admitting: Internal Medicine

## 2013-02-01 ENCOUNTER — Ambulatory Visit (HOSPITAL_BASED_OUTPATIENT_CLINIC_OR_DEPARTMENT_OTHER): Payer: Medicare Other

## 2013-02-01 DIAGNOSIS — C3410 Malignant neoplasm of upper lobe, unspecified bronchus or lung: Secondary | ICD-10-CM

## 2013-02-01 DIAGNOSIS — J449 Chronic obstructive pulmonary disease, unspecified: Secondary | ICD-10-CM

## 2013-02-01 DIAGNOSIS — C771 Secondary and unspecified malignant neoplasm of intrathoracic lymph nodes: Secondary | ICD-10-CM

## 2013-02-01 DIAGNOSIS — C3491 Malignant neoplasm of unspecified part of right bronchus or lung: Secondary | ICD-10-CM

## 2013-02-01 DIAGNOSIS — Z5111 Encounter for antineoplastic chemotherapy: Secondary | ICD-10-CM

## 2013-02-01 DIAGNOSIS — E039 Hypothyroidism, unspecified: Secondary | ICD-10-CM

## 2013-02-01 LAB — CBC WITH DIFFERENTIAL/PLATELET
Basophils Absolute: 0.1 10*3/uL (ref 0.0–0.1)
EOS%: 1.8 % (ref 0.0–7.0)
Eosinophils Absolute: 0.1 10*3/uL (ref 0.0–0.5)
HGB: 14.7 g/dL (ref 11.6–15.9)
LYMPH%: 21.5 % (ref 14.0–49.7)
MCH: 29.1 pg (ref 25.1–34.0)
MCHC: 33.1 g/dL (ref 31.5–36.0)
MCV: 87.7 fL (ref 79.5–101.0)
MONO%: 7.3 % (ref 0.0–14.0)
NEUT#: 5.1 10*3/uL (ref 1.5–6.5)
RDW: 14.8 % — ABNORMAL HIGH (ref 11.2–14.5)

## 2013-02-01 LAB — COMPREHENSIVE METABOLIC PANEL (CC13)
Alkaline Phosphatase: 91 U/L (ref 40–150)
Anion Gap: 10 mEq/L (ref 3–11)
BUN: 13.5 mg/dL (ref 7.0–26.0)
CO2: 25 mEq/L (ref 22–29)
Creatinine: 0.7 mg/dL (ref 0.6–1.1)
Glucose: 82 mg/dl (ref 70–140)
Sodium: 139 mEq/L (ref 136–145)
Total Bilirubin: 1.14 mg/dL (ref 0.20–1.20)

## 2013-02-01 MED ORDER — FAMOTIDINE IN NACL 20-0.9 MG/50ML-% IV SOLN
INTRAVENOUS | Status: AC
  Filled 2013-02-01: qty 50

## 2013-02-01 MED ORDER — SODIUM CHLORIDE 0.9 % IV SOLN
201.2000 mg | Freq: Once | INTRAVENOUS | Status: AC
  Administered 2013-02-01: 200 mg via INTRAVENOUS
  Filled 2013-02-01: qty 20

## 2013-02-01 MED ORDER — ONDANSETRON 16 MG/50ML IVPB (CHCC)
16.0000 mg | Freq: Once | INTRAVENOUS | Status: AC
  Administered 2013-02-01: 16 mg via INTRAVENOUS

## 2013-02-01 MED ORDER — SODIUM CHLORIDE 0.9 % IV SOLN
Freq: Once | INTRAVENOUS | Status: AC
  Administered 2013-02-01: 13:00:00 via INTRAVENOUS

## 2013-02-01 MED ORDER — HEPARIN SOD (PORK) LOCK FLUSH 100 UNIT/ML IV SOLN
500.0000 [IU] | Freq: Once | INTRAVENOUS | Status: AC | PRN
  Administered 2013-02-01: 500 [IU]
  Filled 2013-02-01: qty 5

## 2013-02-01 MED ORDER — DEXAMETHASONE SODIUM PHOSPHATE 20 MG/5ML IJ SOLN
20.0000 mg | Freq: Once | INTRAMUSCULAR | Status: AC
  Administered 2013-02-01: 20 mg via INTRAVENOUS

## 2013-02-01 MED ORDER — DIPHENHYDRAMINE HCL 50 MG/ML IJ SOLN
INTRAMUSCULAR | Status: AC
  Filled 2013-02-01: qty 1

## 2013-02-01 MED ORDER — DEXAMETHASONE SODIUM PHOSPHATE 20 MG/5ML IJ SOLN
INTRAMUSCULAR | Status: AC
  Filled 2013-02-01: qty 5

## 2013-02-01 MED ORDER — SODIUM CHLORIDE 0.9 % IV SOLN
45.0000 mg/m2 | Freq: Once | INTRAVENOUS | Status: AC
  Administered 2013-02-01: 90 mg via INTRAVENOUS
  Filled 2013-02-01: qty 15

## 2013-02-01 MED ORDER — ONDANSETRON 16 MG/50ML IVPB (CHCC)
INTRAVENOUS | Status: AC
  Filled 2013-02-01: qty 16

## 2013-02-01 MED ORDER — DIPHENHYDRAMINE HCL 50 MG/ML IJ SOLN
50.0000 mg | Freq: Once | INTRAMUSCULAR | Status: AC
  Administered 2013-02-01: 50 mg via INTRAVENOUS

## 2013-02-01 MED ORDER — SODIUM CHLORIDE 0.9 % IJ SOLN
10.0000 mL | INTRAMUSCULAR | Status: DC | PRN
  Administered 2013-02-01: 10 mL
  Filled 2013-02-01: qty 10

## 2013-02-01 MED ORDER — FAMOTIDINE IN NACL 20-0.9 MG/50ML-% IV SOLN
20.0000 mg | Freq: Once | INTRAVENOUS | Status: AC
  Administered 2013-02-01: 20 mg via INTRAVENOUS

## 2013-02-01 NOTE — Progress Notes (Signed)
Spoke with Ms. Bobb and her husband in chemo room.  Questions and concerns addressed

## 2013-02-01 NOTE — Telephone Encounter (Signed)
appts made per 12/8 POF Dec cal mailed shh

## 2013-02-01 NOTE — Patient Instructions (Signed)
CURRENT THERAPY: Concurrent chemoradiation with weekly carboplatin for AUC of 2 and paclitaxel 45 mg/M2, status post 1 cycle.   CHEMOTHERAPY INTENT: Control/curative  CURRENT # OF CHEMOTHERAPY CYCLES: 1  CURRENT ANTIEMETICS: Zofran, dexamethasone and Compazine  CURRENT SMOKING STATUS: Former smoker.  ORAL CHEMOTHERAPY AND CONSENT: None  CURRENT BISPHOSPHONATES USE: None  PAIN MANAGEMENT: 0/10  NARCOTICS INDUCED CONSTIPATION: None  LIVING WILL AND CODE STATUS: Full code

## 2013-02-01 NOTE — Progress Notes (Signed)
[facility] [phone]   [phone]  OFFICE PROGRESS NOTE   Duane Lope, MD [address] [place] [address]  DIAGNOSIS: Stage IIIA (T1b., N2, M0) non-small cell lung cancer, squamous cell carcinoma presented with right upper lobe lung nodule in addition to mediastinal lymphadenopathy diagnosed in November of 2014.  PRIOR THERAPY: None  CURRENT THERAPY: Concurrent chemoradiation with weekly carboplatin for AUC of 2 and paclitaxel 45 mg/M2, status post 1 cycle.   CHEMOTHERAPY INTENT: Control/curative  CURRENT # OF CHEMOTHERAPY CYCLES: 1  CURRENT ANTIEMETICS: Zofran, dexamethasone and Compazine  CURRENT SMOKING STATUS: Former smoker.  ORAL CHEMOTHERAPY AND CONSENT: None  CURRENT BISPHOSPHONATES USE: None  PAIN MANAGEMENT: 0/10  NARCOTICS INDUCED CONSTIPATION: None  LIVING WILL AND CODE STATUS: Full code   INTERVAL HISTORY: Dawn Mercer 67 y.o. female returns to the clinic today for followup visit accompanied by her husband. The patient is feeling fine today with no specific complaints. She tolerated the first week of her concurrent chemoradiation fairly well with no significant adverse effects. The patient denied having any significant fever or chills. She has no nausea or vomiting. She denied having any significant chest pain but continues to have shortness of breath with exertion with no cough or hemoptysis. She has no significant weight loss or night sweats. She had MRI of the brain that showed no evidence for metastatic disease to the brain.  MEDICAL HISTORY: Past Medical History  Diagnosis Date  . 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 Status post radiation therapy 10/1999  . GERD (gastroesophageal reflux disease)   . Hypothyroidism   . IBS (irritable bowel syndrome)     Hx: of  . COPD (chronic obstructive pulmonary disease)   .  Shortness of breath     Hx: of with exertion  . Bronchitis     Hx: of  . Hay fever     Hx: of  . Seizures     Hx: of over 40 years ago  . H/O hiatal hernia     ALLERGIES:  is allergic to latex and codeine.  MEDICATIONS:  Current Outpatient Prescriptions  Medication Sig Dispense Refill  . ALPRAZolam (XANAX) 0.5 MG tablet Take 0.25 mg by mouth 2 (two) times daily as needed for anxiety.      . Cholecalciferol (VITAMIN D-3) 5000 UNITS TABS Take 5,000 Units by mouth 2 (two) times a week.      . diphenoxylate-atropine (LOMOTIL) 2.5-0.025 MG per tablet Take 1 tablet by mouth every Sunday.      . esomeprazole (NEXIUM) 40 MG capsule Take 40 mg by mouth daily before breakfast.      . levothyroxine (SYNTHROID, LEVOTHROID) 112 MCG tablet Take 112 mcg by mouth daily before breakfast.      . lidocaine-prilocaine (EMLA) cream Apply 1 application topically as needed. Apply to port 1 hour before chemo appt      . prochlorperazine (COMPAZINE) 10 MG tablet Take 10 mg by mouth every 6 (six) hours as needed for nausea or vomiting.      . traMADol (ULTRAM) 50 MG tablet Take 50 mg by mouth every 6 (six) hours as needed for moderate pain or severe pain.       . Wound Cleansers (RADIAPLEX EX) Apply 1 application topically 2 (two) times daily.      Marland Kitchen albuterol (PROVENTIL HFA;VENTOLIN HFA) 108 (90 BASE) MCG/ACT inhaler  Inhale 1 puff into the lungs every 6 (six) hours as needed for wheezing.      . clidinium-chlordiazePOXIDE (LIBRAX) 5-2.5 MG per capsule Take 1 capsule by mouth daily as needed (for upset stomach).      . LORazepam (ATIVAN) 1 MG tablet One tab po 30 min before MRI  1 tablet  0   No current facility-administered medications for this visit.    SURGICAL HISTORY:  Past Surgical History  Procedure Laterality Date  . Laser ablation  04/2004    for VIN III  . Colonoscopy w/ biopsies and polypectomy      Hx: of  . Tubal ligation    . Video bronchoscopy with endobronchial navigation N/A 12/31/2012      Procedure: VIDEO BRONCHOSCOPY WITH ENDOBRONCHIAL NAVIGATION;  Surgeon: Loreli Slot, MD;  Location: [facility] OR;  Service: Thoracic;  Laterality: N/A;  . Video bronchoscopy with endobronchial ultrasound N/A 12/31/2012    Procedure: VIDEO BRONCHOSCOPY WITH ENDOBRONCHIAL ULTRASOUND;  Surgeon: Loreli Slot, MD;  Location: [facility] OR;  Service: Thoracic;  Laterality: N/A;    REVIEW OF SYSTEMS:  A comprehensive review of systems was negative except for: Respiratory: positive for dyspnea on exertion   PHYSICAL EXAMINATION: General appearance: alert, cooperative and no distress Head: Normocephalic, without obvious abnormality, atraumatic Neck: no adenopathy, no JVD, supple, symmetrical, trachea midline and thyroid not enlarged, symmetric, no tenderness/mass/nodules Lymph nodes: Cervical, supraclavicular, and axillary nodes normal. Resp: clear to auscultation bilaterally Back: symmetric, no curvature. ROM normal. No CVA tenderness. Cardio: regular rate and rhythm, S1, S2 normal, no murmur, click, rub or gallop GI: soft, non-tender; bowel sounds normal; no masses,  no organomegaly Extremities: extremities normal, atraumatic, no cyanosis or edema  ECOG PERFORMANCE STATUS: 1 - Symptomatic but completely ambulatory  Blood pressure 116/64, pulse 67, temperature 98 F (36.7 C), temperature source Oral, resp. rate 18, height 5\' 6"  (1.676 m), weight 189 lb 1.6 oz (85.775 kg), SpO2 98.00%.  LABORATORY DATA: Lab Results  Component Value Date   WBC 7.4 02/01/2013   HGB 14.7 02/01/2013   HCT 44.4 02/01/2013   MCV 87.7 02/01/2013   PLT 257 02/01/2013      Chemistry      Component Value Date/Time   NA 140 01/25/2013 1254   NA 138 12/30/2012 1335   K 4.2 01/25/2013 1254   K 4.1 12/30/2012 1335   CL 101 12/30/2012 1335   CO2 28 01/25/2013 1254   CO2 28 12/30/2012 1335   BUN 8.5 01/25/2013 1254   BUN 8 12/30/2012 1335   CREATININE 0.7 01/25/2013 1254   CREATININE 0.86 12/30/2012 1335      Component  Value Date/Time   CALCIUM 9.5 01/25/2013 1254   CALCIUM 9.2 12/30/2012 1335   ALKPHOS 90 01/25/2013 1254   ALKPHOS 89 12/30/2012 1335   AST 20 01/25/2013 1254   AST 20 12/30/2012 1335   ALT 16 01/25/2013 1254   ALT 17 12/30/2012 1335   BILITOT 0.78 01/25/2013 1254   BILITOT 0.7 12/30/2012 1335       RADIOGRAPHIC STUDIES: Mr Laqueta Jean Wo Contrast  02/07/2013   CLINICAL DATA:  Non-small-cell lung cancer. Rule out metastatic disease.  EXAM: MRI HEAD WITHOUT AND WITH CONTRAST  TECHNIQUE: Multiplanar, multiecho pulse sequences of the brain and surrounding structures were obtained without and with intravenous contrast.  CONTRAST:  18mL MULTIHANCE GADOBENATE DIMEGLUMINE 529 MG/ML IV SOLN  COMPARISON:  None.  FINDINGS: Ventricle size is normal. Craniocervical junction is normal. No skull lesions  are identified.  Small nonenhancing white matter hyperintensities, most consistent with chronic microvascular ischemia. Mild chronic changes in the pons.  Negative for acute infarct.  Negative for hemorrhage or mass.  Postcontrast imaging reveals normal enhancement. No metastatic deposits are identified  IMPRESSION: Negative for metastatic disease.  Mild chronic microvascular ischemic change.   Electronically Signed   By: Marlan Palau M.D.   On: 01/20/2013 15:42   Ir Fluoro Guide Cv Line Right  01/20/2013   CLINICAL DATA:  Lung carcinoma, needs venous access for chemotherapy  EXAM: TUNNELED PORT CATHETER PLACEMENT WITH ULTRASOUND AND FLUOROSCOPIC GUIDANCE  TECHNIQUE: The procedure, risks, benefits, and alternatives were explained to the patient. Questions regarding the procedure were encouraged and answered. The patient understands and consents to the procedure. As antibiotic prophylaxis, cefazolin 2 g was ordered pre-procedure and administered intravenously within one hour of incision. Patency of the right IJ vein was confirmed with ultrasound with image documentation. An appropriate skin site was determined. Skin site  was marked. Region was prepped using maximum barrier technique including cap and mask, sterile gown, sterile gloves, large sterile sheet, and Chlorhexidine as cutaneous antisepsis. The region was infiltrated locally with 1% lidocaine. Under real-time ultrasound guidance, the right IJ vein was accessed with a 21 gauge micropuncture needle; the needle tip within the vein was confirmed with ultrasound image documentation. Needle was exchanged over a 018 guidewire for transitional dilator which allowed passage of the [facility] wire into the IVC. Over this, the transitional dilator was exchanged for a 5 [place] MPA catheter. A small incision was made on the right anterior chest wall and a subcutaneous pocket fashioned. The power-injectable port was positioned and its catheter tunneled to the right IJ dermatotomy site. The MPA catheter was exchanged over an Amplatz wire for a peel-away sheath, through which the port catheter, which had been trimmed to the appropriate length, was advanced and positioned under fluoroscopy with its tip at the cavoatrial junction. Spot chest radiograph confirms good catheter position and no pneumothorax. The pocket was closed with deep interrupted and subcuticular continuous 3-0 Monocryl sutures. The port was flushed per protocol. The incisions were covered with Dermabond then covered with a sterile dressing. No immediate complication.  ANESTHESIA/SEDATION: Intravenous Fentanyl and Versed were administered as conscious sedation during continuous cardiorespiratory monitoring by the radiology RN, with a total moderate sedation time of twenty minutes.  FLUOROSCOPY TIME:  16109 seconds  IMPRESSION: Technically successful right IJ power-injectable port catheter placement. Ready for routine use.   Electronically Signed   By: Oley Balm M.D.   On: 01/20/2013 15:01   Ir US Guide Vasc Access Right  01/20/2013   CLINICAL DATA:  Lung carcinoma, needs venous access for chemotherapy  EXAM: TUNNELED  PORT CATHETER PLACEMENT WITH ULTRASOUND AND FLUOROSCOPIC GUIDANCE  TECHNIQUE: The procedure, risks, benefits, and alternatives were explained to the patient. Questions regarding the procedure were encouraged and answered. The patient understands and consents to the procedure. As antibiotic prophylaxis, cefazolin 2 g was ordered pre-procedure and administered intravenously within one hour of incision. Patency of the right IJ vein was confirmed with ultrasound with image documentation. An appropriate skin site was determined. Skin site was marked. Region was prepped using maximum barrier technique including cap and mask, sterile gown, sterile gloves, large sterile sheet, and Chlorhexidine as cutaneous antisepsis. The region was infiltrated locally with 1% lidocaine. Under real-time ultrasound guidance, the right IJ vein was accessed with a 21 gauge micropuncture needle; the needle tip within the vein was  confirmed with ultrasound image documentation. Needle was exchanged over a 018 guidewire for transitional dilator which allowed passage of the [facility] wire into the IVC. Over this, the transitional dilator was exchanged for a 5 [place] MPA catheter. A small incision was made on the right anterior chest wall and a subcutaneous pocket fashioned. The power-injectable port was positioned and its catheter tunneled to the right IJ dermatotomy site. The MPA catheter was exchanged over an Amplatz wire for a peel-away sheath, through which the port catheter, which had been trimmed to the appropriate length, was advanced and positioned under fluoroscopy with its tip at the cavoatrial junction. Spot chest radiograph confirms good catheter position and no pneumothorax. The pocket was closed with deep interrupted and subcuticular continuous 3-0 Monocryl sutures. The port was flushed per protocol. The incisions were covered with Dermabond then covered with a sterile dressing. No immediate complication.  ANESTHESIA/SEDATION:  Intravenous Fentanyl and Versed were administered as conscious sedation during continuous cardiorespiratory monitoring by the radiology RN, with a total moderate sedation time of twenty minutes.  FLUOROSCOPY TIME:  14782 seconds  IMPRESSION: Technically successful right IJ power-injectable port catheter placement. Ready for routine use.   Electronically Signed   By: Oley Balm M.D.   On: 01/20/2013 15:01   Ct Biopsy  01/11/2013   CLINICAL DATA:  1.7 cm hypermetabolic right upper lobe lung nodule. Previous bronchoscopy demonstrated a single cluster of atypical cells which was not adequate for definitive diagnosis. The patient has been referred for percutaneous biopsy.  EXAM: CT GUIDED CORE BIOPSY OF RIGHT LUNG  ANESTHESIA/SEDATION: 2.0  Mg IV Versed; 150 mcg IV Fentanyl  Total Moderate Sedation Time: 32 minutes.  PROCEDURE: The procedure risks, benefits, and alternatives were explained to the patient. Questions regarding the procedure were encouraged and answered. The patient understands and consents to the procedure.  The right anterior chest wall was prepped with Betadinein a sterile fashion, and a sterile drape was applied covering the operative field. A sterile gown and sterile gloves were used for the procedure. Local anesthesia was provided with 1% Lidocaine.  Localizing CT was performed in a supine position. Under CT guidance, a 17 gauge trocar needle was advanced to the margin of the right upper lobe lung nodule. After confirming needle tip position, a total of 2 separate 18 gauge core biopsy samples were obtained. Post biopsy CT was performed after the outer needle removal.  Complications: Tiny amount of pleural air.  FINDINGS: Imaging demonstrates the anterior right upper lobe nodule measuring roughly 1.7 cm in greatest diameter. Antral lateral approach was performed. Core biopsy yielded solid tissue. During the procedure, a small amount of adjacent pleural air became evident. This will be followed  by a chest x-ray 2 hr after the procedure.  IMPRESSION: CT-guided core biopsy performed of a 1.7 cm right upper lobe lung nodule. Solid tissue was obtained. Tiny pleural air/pneumothorax was present on biopsy completion which will be followed by chest x-ray.   Electronically Signed   By: Irish Lack M.D.   On: 01/11/2013 13:23   Dg Chest Port 1 View  01/11/2013   CLINICAL DATA:  Status post lung biopsy.  EXAM: PORTABLE CHEST - 1 VIEW  COMPARISON:  12/30/2012.  FINDINGS: No postprocedural pneumothorax is identified. The heart and lungs are stable.  IMPRESSION: No postprocedural pneumothorax.   Electronically Signed   By: Loralie Champagne M.D.   On: 01/11/2013 15:30    ASSESSMENT AND PLAN: This is a very pleasant 67 years old  white female recently diagnosed with a stage IIIa non-small cell lung cancer currently undergoing concurrent chemoradiation with weekly carboplatin and paclitaxel status post 1 weekly treatment. She is tolerating her treatment fairly well with no significant adverse effects. I recommended for the patient to proceed with her treatment as scheduled. She would come back for followup visit in 2 weeks for evaluation and management any adverse effect of her treatment. She was advised to call immediately if she has any concerning symptoms in the interval.  The patient voices understanding of current disease status and treatment options and is in agreement with the current care plan.  All questions were answered. The patient knows to call the clinic with any problems, questions or concerns. We can certainly see the patient much sooner if necessary.

## 2013-02-02 ENCOUNTER — Ambulatory Visit
Admission: RE | Admit: 2013-02-02 | Discharge: 2013-02-02 | Disposition: A | Discharge status: Home / Self Care | Payer: Medicare Other | Source: Ambulatory Visit | Attending: Radiation Oncology | Admitting: Radiation Oncology

## 2013-02-02 MED ORDER — SUCRALFATE 1 GM/10ML PO SUSP: 1.0000 g | Freq: Three times a day (TID) | ORAL | Status: DC

## 2013-02-02 MED ORDER — TRAMADOL HCL 50 MG PO TABS: 50.0000 mg | ORAL_TABLET | Freq: Four times a day (QID) | ORAL | Status: DC | PRN

## 2013-02-02 NOTE — Progress Notes (Signed)
Dawn Mercer here with her husband.  She has had 4 fractions to her right chest.  She denies pain but did have a sore throat this morning.  She said it is gone now.  She denies a cough, shortness of breath and fatigue.  She is having trouble sleeping and is wondering if she can take a whole xanax at bedtime.  She reports that her skin is intact.  She is using radiaplex gel.  She reports that her port site is sore from chemotherapy yesterday.  She thinks that some of the adhesive in the incision was pulled off with the tape.

## 2013-02-02 NOTE — Progress Notes (Signed)
[facility]    Radiation Oncology [address] [place]     Maryln Gottron, M.D. [place], [place] [address]               Billie Lade, M.D., Ph.D. Phone: [phone]      Molli Hazard A. Kathrynn Running, M.D. Fax: [phone]      Radene Gunning, M.D., Ph.D.         Lurline Hare, M.D.         Grayland Jack, M.D Weekly Treatment Management Note  Name: Dawn Mercer     MRN: [id number]        CSN: [id number] Date: 02/02/2013      DOB: [birth year]  CC:  Duane Lope, MD         Tenny Craw    Status: Outpatient  Diagnosis: The encounter diagnosis was Malignant neoplasm of upper lobe, bronchus or lung, right.  Current Dose: 7.2 Gy  Current Fraction: 4  Planned Dose: 63 Gy  Narrative: Dawn Mercer was seen today for weekly treatment management. The chart was checked and CBCT  were reviewed. She is tolerating the treatments well thus far. She has been having difficulty sleeping at night and will take a full Xanax. I did refill the patient's tramadol today. She was also given a prescription for Carafate suspension.  Latex and Codeine Current Outpatient Prescriptions  Medication Sig Dispense Refill  . albuterol (PROVENTIL HFA;VENTOLIN HFA) 108 (90 BASE) MCG/ACT inhaler Inhale 1 puff into the lungs every 6 (six) hours as needed for wheezing.      Marland Kitchen ALPRAZolam (XANAX) 0.5 MG tablet Take 0.25 mg by mouth 2 (two) times daily as needed for anxiety.      . Cholecalciferol (VITAMIN D-3) 5000 UNITS TABS Take 5,000 Units by mouth 2 (two) times a week.      . diphenoxylate-atropine (LOMOTIL) 2.5-0.025 MG per tablet Take 1 tablet by mouth every Sunday.      . esomeprazole (NEXIUM) 40 MG capsule Take 40 mg by mouth daily before breakfast.      . levothyroxine (SYNTHROID, LEVOTHROID) 112 MCG tablet Take 112 mcg by mouth daily before breakfast.      . lidocaine-prilocaine (EMLA) cream Apply 1 application topically as needed. Apply to port 1 hour before chemo appt      . LORazepam (ATIVAN) 1 MG  tablet One tab po 30 min before MRI  1 tablet  0  . prochlorperazine (COMPAZINE) 10 MG tablet Take 10 mg by mouth every 6 (six) hours as needed for nausea or vomiting.      . traMADol (ULTRAM) 50 MG tablet Take 1 tablet (50 mg total) by mouth every 6 (six) hours as needed for moderate pain or severe pain.  50 tablet  0  . Wound Cleansers (RADIAPLEX EX) Apply 1 application topically 2 (two) times daily.      . clidinium-chlordiazePOXIDE (LIBRAX) 5-2.5 MG per capsule Take 1 capsule by mouth daily as needed (for upset stomach).      . sucralfate (CARAFATE) 1 GM/10ML suspension Take 10 mLs (1 g total) by mouth 4 (four) times daily -  with meals and at bedtime.  420 mL  1   No current facility-administered medications for this encounter.   Labs:  Lab Results  Component Value Date   WBC 7.4 02/01/2013   HGB 14.7 02/01/2013   HCT 44.4 02/01/2013   MCV 87.7 02/01/2013   PLT 257 02/01/2013   Lab Results  Component Value Date  CREATININE 0.7 02/01/2013   BUN 13.5 02/01/2013   NA 139 02/01/2013   K 4.5 02/01/2013   CL 101 12/30/2012   CO2 25 02/01/2013   Lab Results  Component Value Date   ALT 19 02/01/2013   AST 20 02/01/2013   BILITOT 1.14 02/01/2013    Physical Examination:  Filed Vitals:   02/02/13 1141  BP: 123/63  Pulse: 67  Temp: 98.3 F (36.8 C)    Wt Readings from Last 3 Encounters:  02/02/13 191 lb 11.2 oz (86.955 kg)  02/01/13 189 lb 1.6 oz (85.775 kg)  01/14/13 190 lb 9.6 oz (86.456 kg)     Lungs - Normal respiratory effort, chest expands symmetrically. Lungs are clear to auscultation, no crackles or wheezes.  Heart has regular rhythm and rate  Abdomen is soft and non tender with normal bowel sounds  Assessment:  Patient tolerating treatments well  Plan: Continue treatment per original radiation prescription

## 2013-02-03 ENCOUNTER — Ambulatory Visit
Admission: RE | Admit: 2013-02-03 | Discharge: 2013-02-03 | Disposition: A | Discharge status: Home / Self Care | Payer: Medicare Other | Source: Ambulatory Visit | Attending: Radiation Oncology | Admitting: Radiation Oncology

## 2013-02-04 ENCOUNTER — Ambulatory Visit
Admission: RE | Admit: 2013-02-04 | Discharge: 2013-02-04 | Disposition: A | Discharge status: Home / Self Care | Payer: Medicare Other | Source: Ambulatory Visit | Attending: Radiation Oncology | Admitting: Radiation Oncology

## 2013-02-05 ENCOUNTER — Ambulatory Visit
Admission: RE | Admit: 2013-02-05 | Discharge: 2013-02-05 | Disposition: A | Discharge status: Home / Self Care | Payer: Medicare Other | Source: Ambulatory Visit | Attending: Radiation Oncology | Admitting: Radiation Oncology

## 2013-02-08 ENCOUNTER — Other Ambulatory Visit: Payer: Self-pay | Admitting: Internal Medicine

## 2013-02-08 ENCOUNTER — Ambulatory Visit (HOSPITAL_BASED_OUTPATIENT_CLINIC_OR_DEPARTMENT_OTHER): Payer: Medicare Other

## 2013-02-08 ENCOUNTER — Ambulatory Visit
Admission: RE | Admit: 2013-02-08 | Discharge: 2013-02-08 | Disposition: A | Discharge status: Home / Self Care | Payer: Medicare Other | Source: Ambulatory Visit | Attending: Radiation Oncology | Admitting: Radiation Oncology

## 2013-02-08 ENCOUNTER — Other Ambulatory Visit (HOSPITAL_BASED_OUTPATIENT_CLINIC_OR_DEPARTMENT_OTHER): Payer: Medicare Other

## 2013-02-08 DIAGNOSIS — C3410 Malignant neoplasm of upper lobe, unspecified bronchus or lung: Secondary | ICD-10-CM

## 2013-02-08 DIAGNOSIS — Z5111 Encounter for antineoplastic chemotherapy: Secondary | ICD-10-CM

## 2013-02-08 DIAGNOSIS — C3491 Malignant neoplasm of unspecified part of right bronchus or lung: Secondary | ICD-10-CM

## 2013-02-08 DIAGNOSIS — C771 Secondary and unspecified malignant neoplasm of intrathoracic lymph nodes: Secondary | ICD-10-CM

## 2013-02-08 LAB — COMPREHENSIVE METABOLIC PANEL (CC13)
AST: 18 U/L (ref 5–34)
Alkaline Phosphatase: 92 U/L (ref 40–150)
Anion Gap: 10 mEq/L (ref 3–11)
BUN: 10.1 mg/dL (ref 7.0–26.0)
CO2: 24 mEq/L (ref 22–29)
Creatinine: 0.7 mg/dL (ref 0.6–1.1)
Glucose: 88 mg/dl (ref 70–140)
Total Bilirubin: 0.82 mg/dL (ref 0.20–1.20)
Total Protein: 7.3 g/dL (ref 6.4–8.3)

## 2013-02-08 LAB — CBC WITH DIFFERENTIAL/PLATELET
Basophils Absolute: 0 10*3/uL (ref 0.0–0.1)
Eosinophils Absolute: 0.1 10*3/uL (ref 0.0–0.5)
HCT: 42.3 % (ref 34.8–46.6)
HGB: 13.5 g/dL (ref 11.6–15.9)
LYMPH%: 18.8 % (ref 14.0–49.7)
MONO#: 0.5 10*3/uL (ref 0.1–0.9)
NEUT#: 4.5 10*3/uL (ref 1.5–6.5)
NEUT%: 71.7 % (ref 38.4–76.8)
Platelets: 233 10*3/uL (ref 145–400)
WBC: 6.2 10*3/uL (ref 3.9–10.3)

## 2013-02-08 MED ORDER — DIPHENHYDRAMINE HCL 50 MG/ML IJ SOLN
INTRAMUSCULAR | Status: AC
  Filled 2013-02-08: qty 1

## 2013-02-08 MED ORDER — SODIUM CHLORIDE 0.9 % IJ SOLN
10.0000 mL | INTRAMUSCULAR | Status: DC | PRN
  Administered 2013-02-08: 10 mL
  Filled 2013-02-08: qty 10

## 2013-02-08 MED ORDER — DEXAMETHASONE SODIUM PHOSPHATE 20 MG/5ML IJ SOLN
INTRAMUSCULAR | Status: AC
  Filled 2013-02-08: qty 5

## 2013-02-08 MED ORDER — PACLITAXEL CHEMO INJECTION 300 MG/50ML
45.0000 mg/m2 | Freq: Once | INTRAVENOUS | Status: AC
  Administered 2013-02-08: 90 mg via INTRAVENOUS
  Filled 2013-02-08: qty 15

## 2013-02-08 MED ORDER — DIPHENHYDRAMINE HCL 50 MG/ML IJ SOLN: 50.0000 mg | Freq: Once | INTRAMUSCULAR | Status: DC

## 2013-02-08 MED ORDER — FAMOTIDINE IN NACL 20-0.9 MG/50ML-% IV SOLN
INTRAVENOUS | Status: AC
  Filled 2013-02-08: qty 50

## 2013-02-08 MED ORDER — FAMOTIDINE IN NACL 20-0.9 MG/50ML-% IV SOLN
20.0000 mg | Freq: Once | INTRAVENOUS | Status: AC
  Administered 2013-02-08: 20 mg via INTRAVENOUS

## 2013-02-08 MED ORDER — SODIUM CHLORIDE 0.9 % IV SOLN
Freq: Once | INTRAVENOUS | Status: AC
  Administered 2013-02-08: 12:00:00 via INTRAVENOUS

## 2013-02-08 MED ORDER — ONDANSETRON 16 MG/50ML IVPB (CHCC)
INTRAVENOUS | Status: AC
  Filled 2013-02-08: qty 16

## 2013-02-08 MED ORDER — HEPARIN SOD (PORK) LOCK FLUSH 100 UNIT/ML IV SOLN
500.0000 [IU] | Freq: Once | INTRAVENOUS | Status: AC | PRN
  Administered 2013-02-08: 500 [IU]
  Filled 2013-02-08: qty 5

## 2013-02-08 MED ORDER — SODIUM CHLORIDE 0.9 % IV SOLN
201.2000 mg | Freq: Once | INTRAVENOUS | Status: AC
  Administered 2013-02-08: 200 mg via INTRAVENOUS
  Filled 2013-02-08: qty 20

## 2013-02-08 MED ORDER — DIPHENHYDRAMINE HCL 50 MG/ML IJ SOLN
25.0000 mg | Freq: Once | INTRAMUSCULAR | Status: AC
  Administered 2013-02-08: 25 mg via INTRAVENOUS

## 2013-02-08 MED ORDER — DEXAMETHASONE SODIUM PHOSPHATE 20 MG/5ML IJ SOLN
20.0000 mg | Freq: Once | INTRAMUSCULAR | Status: AC
  Administered 2013-02-08: 20 mg via INTRAVENOUS

## 2013-02-08 MED ORDER — ONDANSETRON 16 MG/50ML IVPB (CHCC)
16.0000 mg | Freq: Once | INTRAVENOUS | Status: AC
  Administered 2013-02-08: 16 mg via INTRAVENOUS

## 2013-02-08 NOTE — Patient Instructions (Signed)
[facility] Discharge Instructions for Patients Receiving Chemotherapy  Today you received the following chemotherapy agents: Carboplatin, Taxol   To help prevent nausea and vomiting after your treatment, we encourage you to take your nausea medication as prescribed.    If you develop nausea and vomiting that is not controlled by your nausea medication, call the clinic.   BELOW ARE SYMPTOMS THAT SHOULD BE REPORTED IMMEDIATELY:  *FEVER GREATER THAN 100.5 F  *CHILLS WITH OR WITHOUT FEVER  NAUSEA AND VOMITING THAT IS NOT CONTROLLED WITH YOUR NAUSEA MEDICATION  *UNUSUAL SHORTNESS OF BREATH  *UNUSUAL BRUISING OR BLEEDING  TENDERNESS IN MOUTH AND THROAT WITH OR WITHOUT PRESENCE OF ULCERS  *URINARY PROBLEMS  *BOWEL PROBLEMS  UNUSUAL RASH Items with * indicate a potential emergency and should be followed up as soon as possible.  Feel free to call the clinic you have any questions or concerns. The clinic phone number is [phone].    

## 2013-02-09 ENCOUNTER — Ambulatory Visit
Admission: RE | Admit: 2013-02-09 | Discharge: 2013-02-09 | Disposition: A | Discharge status: Home / Self Care | Payer: Medicare Other | Source: Ambulatory Visit | Attending: Radiation Oncology | Admitting: Radiation Oncology

## 2013-02-09 MED ORDER — RADIAPLEXRX EX GEL
Freq: Once | CUTANEOUS | Status: AC
  Administered 2013-02-09: 16:00:00 via TOPICAL

## 2013-02-09 MED ORDER — TEMAZEPAM 15 MG PO CAPS: 15.0000 mg | ORAL_CAPSULE | Freq: Every evening | ORAL | Status: DC | PRN

## 2013-02-09 NOTE — Addendum Note (Signed)
Encounter addended by: Eduardo Osier, RN on: 02/09/2013  3:34 PM<BR>     Documentation filed: Orders

## 2013-02-09 NOTE — Progress Notes (Signed)
[facility]    Radiation Oncology [address] [place]     Maryln Gottron, M.D. [employer], [place] [address]               Billie Lade, M.D., Ph.D. Phone: [phone]      Molli Hazard A. Kathrynn Running, M.D. Fax: [phone]      Radene Gunning, M.D., Ph.D.         Lurline Hare, M.D.         Grayland Jack, M.D Weekly Treatment Management Note  Name: Dawn Mercer     MRN: [id number]        CSN: [id number] Date: 02/09/2013      DOB: 13-Nov-1945  CC:  Duane Lope, MD         Tenny Craw    Status: Outpatient  Diagnosis: The encounter diagnosis was Malignant neoplasm of upper lobe, bronchus or lung, right.  Current Dose: 16.2 Gy  Current Fraction: 9  Planned Dose: 63 Gy  Narrative: [name] was seen today for weekly treatment management. The chart was checked and CBCT  were reviewed. She is tolerating the treatments well thus far. She denies any swallowing difficulties or breathing problems. She has had some fatigue but attributes this to poor sleeping at night. I have given her Restoril to try.  Latex and Codeine Current Outpatient Prescriptions  Medication Sig Dispense Refill  . albuterol (PROVENTIL HFA;VENTOLIN HFA) 108 (90 BASE) MCG/ACT inhaler Inhale 1 puff into the lungs every 6 (six) hours as needed for wheezing.      Marland Kitchen ALPRAZolam (XANAX) 0.5 MG tablet Take 0.25 mg by mouth 2 (two) times daily as needed for anxiety.      . Cholecalciferol (VITAMIN D-3) 5000 UNITS TABS Take 5,000 Units by mouth 2 (two) times a week.      . clidinium-chlordiazePOXIDE (LIBRAX) 5-2.5 MG per capsule Take 1 capsule by mouth daily as needed (for upset stomach).      . esomeprazole (NEXIUM) 40 MG capsule Take 40 mg by mouth daily before breakfast.      . levothyroxine (SYNTHROID, LEVOTHROID) 112 MCG tablet Take 112 mcg by mouth daily before breakfast.      . lidocaine-prilocaine (EMLA) cream Apply 1 application topically as needed. Apply to port 1 hour before chemo appt      .  LORazepam (ATIVAN) 1 MG tablet One tab po 30 min before MRI  1 tablet  0  . prochlorperazine (COMPAZINE) 10 MG tablet Take 10 mg by mouth every 6 (six) hours as needed for nausea or vomiting.      . sucralfate (CARAFATE) 1 GM/10ML suspension Take 10 mLs (1 g total) by mouth 4 (four) times daily -  with meals and at bedtime.  420 mL  1  . traMADol (ULTRAM) 50 MG tablet Take 1 tablet (50 mg total) by mouth every 6 (six) hours as needed for moderate pain or severe pain.  50 tablet  0  . Wound Cleansers (RADIAPLEX EX) Apply 1 application topically 2 (two) times daily.      . diphenoxylate-atropine (LOMOTIL) 2.5-0.025 MG per tablet Take 1 tablet by mouth every Sunday.      . temazepam (RESTORIL) 15 MG capsule Take 1 capsule (15 mg total) by mouth at bedtime as needed for sleep.  30 capsule  0   No current facility-administered medications for this encounter.   Labs:  Lab Results  Component Value Date   WBC 6.2 02/08/2013   HGB 13.5  02/08/2013   HCT 42.3 02/08/2013   MCV 89.2 02/08/2013   PLT 233 02/08/2013   Lab Results  Component Value Date   CREATININE 0.7 02/08/2013   BUN 10.1 02/08/2013   NA 141 02/08/2013   K 4.2 02/08/2013   CL 101 12/30/2012   CO2 24 02/08/2013   Lab Results  Component Value Date   ALT 18 02/08/2013   AST 18 02/08/2013   BILITOT 0.82 02/08/2013    Physical Examination:  Filed Vitals:   02/09/13 1148  BP: 118/99  Pulse: 71  Temp: 98.3 F (36.8 C)    Wt Readings from Last 3 Encounters:  02/09/13 192 lb 11.2 oz (87.408 kg)  02/02/13 191 lb 11.2 oz (86.955 kg)  02/01/13 189 lb 1.6 oz (85.775 kg)     Lungs - Normal respiratory effort, chest expands symmetrically. Lungs are clear to auscultation, no crackles or wheezes.  Heart has regular rhythm and rate  Abdomen is soft and non tender with normal bowel sounds  Assessment:  Patient tolerating treatments well  Plan: Continue treatment per original radiation prescription

## 2013-02-09 NOTE — Addendum Note (Signed)
Encounter addended by: Sonora Catlin R Taela Charbonneau, RN on: 02/09/2013  3:39 PM<BR>     Documentation filed: Inpatient MAR

## 2013-02-09 NOTE — Progress Notes (Addendum)
Dawn Mercer has had 9 fractions to her right chest.  She denies pain.  She does have fatigue.  She is having trouble sleeping and is wondering what she can take.  Her bp today was 118/99 and 124/108.  She denies shortness of breath.  She does have a cough that is not any worse than before she started treatment.  Her skin is intact on her right chest.  She does have slight redness on her right upper back.  She is using radiaplex twice a day.  She is requesting a refill on the radiaplex.  Another tube was given.

## 2013-02-10 ENCOUNTER — Ambulatory Visit
Admission: RE | Admit: 2013-02-10 | Discharge: 2013-02-10 | Disposition: A | Discharge status: Home / Self Care | Payer: Medicare Other | Source: Ambulatory Visit | Attending: Radiation Oncology | Admitting: Radiation Oncology

## 2013-02-11 ENCOUNTER — Ambulatory Visit
Admission: RE | Admit: 2013-02-11 | Discharge: 2013-02-11 | Disposition: A | Discharge status: Home / Self Care | Payer: Medicare Other | Source: Ambulatory Visit | Attending: Radiation Oncology | Admitting: Radiation Oncology

## 2013-02-12 ENCOUNTER — Ambulatory Visit
Admission: RE | Admit: 2013-02-12 | Discharge: 2013-02-12 | Disposition: A | Discharge status: Home / Self Care | Payer: Medicare Other | Source: Ambulatory Visit | Attending: Radiation Oncology | Admitting: Radiation Oncology

## 2013-02-15 ENCOUNTER — Other Ambulatory Visit: Payer: Medicare Other | Admitting: Lab

## 2013-02-15 ENCOUNTER — Encounter: Payer: Self-pay | Admitting: Physician Assistant

## 2013-02-15 ENCOUNTER — Ambulatory Visit (HOSPITAL_BASED_OUTPATIENT_CLINIC_OR_DEPARTMENT_OTHER): Payer: Medicare Other

## 2013-02-15 ENCOUNTER — Ambulatory Visit (HOSPITAL_BASED_OUTPATIENT_CLINIC_OR_DEPARTMENT_OTHER): Payer: Medicare Other | Admitting: Physician Assistant

## 2013-02-15 ENCOUNTER — Telehealth: Payer: Self-pay | Admitting: Internal Medicine

## 2013-02-15 ENCOUNTER — Telehealth: Payer: Self-pay | Admitting: *Deleted

## 2013-02-15 ENCOUNTER — Ambulatory Visit
Admission: RE | Admit: 2013-02-15 | Discharge: 2013-02-15 | Disposition: A | Discharge status: Home / Self Care | Payer: Medicare Other | Source: Ambulatory Visit | Attending: Radiation Oncology | Admitting: Radiation Oncology

## 2013-02-15 ENCOUNTER — Other Ambulatory Visit (HOSPITAL_BASED_OUTPATIENT_CLINIC_OR_DEPARTMENT_OTHER): Payer: Medicare Other

## 2013-02-15 DIAGNOSIS — C3410 Malignant neoplasm of upper lobe, unspecified bronchus or lung: Secondary | ICD-10-CM

## 2013-02-15 DIAGNOSIS — C3491 Malignant neoplasm of unspecified part of right bronchus or lung: Secondary | ICD-10-CM

## 2013-02-15 DIAGNOSIS — Z23 Encounter for immunization: Secondary | ICD-10-CM

## 2013-02-15 DIAGNOSIS — Z5111 Encounter for antineoplastic chemotherapy: Secondary | ICD-10-CM

## 2013-02-15 LAB — CBC WITH DIFFERENTIAL/PLATELET
BASO%: 0.6 % (ref 0.0–2.0)
EOS%: 0.6 % (ref 0.0–7.0)
HCT: 39.9 % (ref 34.8–46.6)
HGB: 13.5 g/dL (ref 11.6–15.9)
MCH: 29.5 pg (ref 25.1–34.0)
MCHC: 33.8 g/dL (ref 31.5–36.0)
MONO#: 0.5 10*3/uL (ref 0.1–0.9)
MONO%: 9.2 % (ref 0.0–14.0)
NEUT%: 68.3 % (ref 38.4–76.8)
Platelets: 205 10*3/uL (ref 145–400)
WBC: 4.9 10*3/uL (ref 3.9–10.3)
lymph#: 1 10*3/uL (ref 0.9–3.3)

## 2013-02-15 LAB — COMPREHENSIVE METABOLIC PANEL (CC13)
ALT: 27 U/L (ref 0–55)
AST: 22 U/L (ref 5–34)
Albumin: 3.6 g/dL (ref 3.5–5.0)
BUN: 8.3 mg/dL (ref 7.0–26.0)
Calcium: 9.5 mg/dL (ref 8.4–10.4)
Chloride: 106 mEq/L (ref 98–109)
Potassium: 3.7 mEq/L (ref 3.5–5.1)
Sodium: 140 mEq/L (ref 136–145)
Total Bilirubin: 0.99 mg/dL (ref 0.20–1.20)
Total Protein: 7.3 g/dL (ref 6.4–8.3)

## 2013-02-15 MED ORDER — SODIUM CHLORIDE 0.9 % IV SOLN
201.2000 mg | Freq: Once | INTRAVENOUS | Status: AC
  Administered 2013-02-15: 200 mg via INTRAVENOUS
  Filled 2013-02-15: qty 20

## 2013-02-15 MED ORDER — FAMOTIDINE IN NACL 20-0.9 MG/50ML-% IV SOLN
INTRAVENOUS | Status: AC
  Filled 2013-02-15: qty 50

## 2013-02-15 MED ORDER — FAMOTIDINE IN NACL 20-0.9 MG/50ML-% IV SOLN
20.0000 mg | Freq: Once | INTRAVENOUS | Status: AC
  Administered 2013-02-15: 20 mg via INTRAVENOUS

## 2013-02-15 MED ORDER — DIPHENHYDRAMINE HCL 50 MG/ML IJ SOLN
25.0000 mg | Freq: Once | INTRAMUSCULAR | Status: AC
  Administered 2013-02-15: 25 mg via INTRAVENOUS

## 2013-02-15 MED ORDER — DEXAMETHASONE SODIUM PHOSPHATE 20 MG/5ML IJ SOLN
INTRAMUSCULAR | Status: AC
  Filled 2013-02-15: qty 5

## 2013-02-15 MED ORDER — DEXAMETHASONE SODIUM PHOSPHATE 20 MG/5ML IJ SOLN
20.0000 mg | Freq: Once | INTRAMUSCULAR | Status: AC
  Administered 2013-02-15: 20 mg via INTRAVENOUS

## 2013-02-15 MED ORDER — INFLUENZA VAC SPLIT QUAD 0.5 ML IM SUSP
0.5000 mL | Freq: Once | INTRAMUSCULAR | Status: DC
  Filled 2013-02-15: qty 0.5

## 2013-02-15 MED ORDER — ONDANSETRON 16 MG/50ML IVPB (CHCC)
INTRAVENOUS | Status: AC
  Filled 2013-02-15: qty 16

## 2013-02-15 MED ORDER — DIPHENHYDRAMINE HCL 50 MG/ML IJ SOLN
INTRAMUSCULAR | Status: AC
  Filled 2013-02-15: qty 1

## 2013-02-15 MED ORDER — ONDANSETRON 16 MG/50ML IVPB (CHCC)
16.0000 mg | Freq: Once | INTRAVENOUS | Status: AC
  Administered 2013-02-15: 16 mg via INTRAVENOUS

## 2013-02-15 MED ORDER — INFLUENZA VAC SPLIT QUAD 0.5 ML IM SUSP
0.5000 mL | Freq: Once | INTRAMUSCULAR | Status: AC
  Administered 2013-02-15: 0.5 mL via INTRAMUSCULAR
  Filled 2013-02-15: qty 0.5

## 2013-02-15 MED ORDER — SODIUM CHLORIDE 0.9 % IV SOLN
Freq: Once | INTRAVENOUS | Status: AC
  Administered 2013-02-15: 15:00:00 via INTRAVENOUS

## 2013-02-15 MED ORDER — HEPARIN SOD (PORK) LOCK FLUSH 100 UNIT/ML IV SOLN
500.0000 [IU] | Freq: Once | INTRAVENOUS | Status: AC | PRN
  Administered 2013-02-15: 500 [IU]
  Filled 2013-02-15: qty 5

## 2013-02-15 MED ORDER — SODIUM CHLORIDE 0.9 % IJ SOLN
10.0000 mL | INTRAMUSCULAR | Status: DC | PRN
  Filled 2013-02-15: qty 10

## 2013-02-15 MED ORDER — SODIUM CHLORIDE 0.9 % IV SOLN
45.0000 mg/m2 | Freq: Once | INTRAVENOUS | Status: AC
  Administered 2013-02-15: 90 mg via INTRAVENOUS
  Filled 2013-02-15: qty 15

## 2013-02-15 NOTE — Telephone Encounter (Signed)
Per staff message and POF I have scheduled appts.  JMW  

## 2013-02-15 NOTE — Telephone Encounter (Signed)
Gave pt appt for lab,md and chemo for december and January 2015 °

## 2013-02-15 NOTE — Progress Notes (Addendum)
[facility] Telephone:([phone]   [phone]  SHARED VISIT PROGRESS NOTE   Dawn Lope, MD [address] [place] [address]  DIAGNOSIS: Stage IIIA (T1b., N2, M0) non-small cell lung cancer, squamous cell carcinoma presented with right upper lobe lung nodule in addition to mediastinal lymphadenopathy diagnosed in November of 2014.  PRIOR THERAPY: None  CURRENT THERAPY: Concurrent chemoradiation with weekly carboplatin for AUC of 2 and paclitaxel 45 mg/M2, status post 3 cycle.   CHEMOTHERAPY INTENT: Control/curative  CURRENT # OF CHEMOTHERAPY CYCLES: 4  CURRENT ANTIEMETICS: Zofran, dexamethasone and Compazine  CURRENT SMOKING STATUS: Former smoker.  ORAL CHEMOTHERAPY AND CONSENT: None  CURRENT BISPHOSPHONATES USE: None  PAIN MANAGEMENT: 0/10  NARCOTICS INDUCED CONSTIPATION: None  LIVING WILL AND CODE STATUS: Full code   INTERVAL HISTORY: Dawn Mercer 67 y.o. female returns to the clinic today for followup visit accompanied by her husband. The patient is feeling fine today with no specific complaints. She is tolerating her course of concurrent chemoradiation fairly well with no significant adverse effects. The patient denied having any significant fever or chills. She has no nausea or vomiting. She denied having any significant chest pain but continues to have shortness of breath with exertion with no cough or hemoptysis. She has no significant weight loss or night sweats. She requests a flu vaccine today.  MEDICAL HISTORY: Past Medical History  Diagnosis Date  . 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 Status post radiation therapy 10/1999  . GERD (gastroesophageal reflux disease)   . Hypothyroidism   . IBS (irritable bowel syndrome)     Hx: of  . COPD (chronic obstructive pulmonary disease)   . Shortness of breath     Hx: of with exertion  .  Bronchitis     Hx: of  . Hay fever     Hx: of  . Seizures     Hx: of over 40 years ago  . H/O hiatal hernia     ALLERGIES:  is allergic to latex and codeine.  MEDICATIONS:  Current Outpatient Prescriptions  Medication Sig Dispense Refill  . Cholecalciferol (VITAMIN D-3) 5000 UNITS TABS Take 5,000 Units by mouth 2 (two) times a week.      . diphenoxylate-atropine (LOMOTIL) 2.5-0.025 MG per tablet Take 1 tablet by mouth every Sunday.      . esomeprazole (NEXIUM) 40 MG capsule Take 40 mg by mouth daily before breakfast.      . levothyroxine (SYNTHROID, LEVOTHROID) 112 MCG tablet Take 112 mcg by mouth daily before breakfast.      . lidocaine-prilocaine (EMLA) cream Apply 1 application topically as needed. Apply to port 1 hour before chemo appt      . sucralfate (CARAFATE) 1 GM/10ML suspension Take 10 mLs (1 g total) by mouth 4 (four) times daily -  with meals and at bedtime.  420 mL  1  . temazepam (RESTORIL) 15 MG capsule Take 1 capsule (15 mg total) by mouth at bedtime as needed for sleep.  30 capsule  0  . traMADol (ULTRAM) 50 MG tablet Take 1 tablet (50 mg total) by mouth every 6 (six) hours as needed for moderate pain or severe pain.  50 tablet  0  . Wound Cleansers (RADIAPLEX EX) Apply 1 application topically 2 (two) times daily.      Marland Kitchen albuterol (PROVENTIL HFA;VENTOLIN HFA) 108 (90 BASE) MCG/ACT inhaler  Inhale 1 puff into the lungs every 6 (six) hours as needed for wheezing.      Marland Kitchen ALPRAZolam (XANAX) 0.5 MG tablet Take 0.25 mg by mouth 2 (two) times daily as needed for anxiety.      . clidinium-chlordiazePOXIDE (LIBRAX) 5-2.5 MG per capsule Take 1 capsule by mouth daily as needed (for upset stomach).      . LORazepam (ATIVAN) 1 MG tablet One tab po 30 min before MRI  1 tablet  0  . prochlorperazine (COMPAZINE) 10 MG tablet Take 10 mg by mouth every 6 (six) hours as needed for nausea or vomiting.       Current Facility-Administered Medications  Medication Dose Route Frequency Provider  Last Rate Last Dose  . influenza vac split quadrivalent PF (FLUARIX) injection 0.5 mL  0.5 mL Intramuscular Once Dinero Chavira E Aidynn Polendo, PA-C       Facility-Administered Medications Ordered in Other Visits  Medication Dose Route Frequency Provider Last Rate Last Dose  . CARBOplatin (PARAPLATIN) 200 mg in sodium chloride 0.9 % 100 mL chemo infusion  200 mg Intravenous Once Si Gaul, MD 240 mL/hr at 02/15/13 1623 200 mg at 02/15/13 1623  . heparin lock flush 100 unit/mL  500 Units Intracatheter Once PRN Si Gaul, MD      . sodium chloride 0.9 % injection 10 mL  10 mL Intracatheter PRN Si Gaul, MD        SURGICAL HISTORY:  Past Surgical History  Procedure Laterality Date  . Laser ablation  04/2004    for VIN III  . Colonoscopy w/ biopsies and polypectomy      Hx: of  . Tubal ligation    . Video bronchoscopy with endobronchial navigation N/A 12/31/2012    Procedure: VIDEO BRONCHOSCOPY WITH ENDOBRONCHIAL NAVIGATION;  Surgeon: Loreli Slot, MD;  Location: [facility] OR;  Service: Thoracic;  Laterality: N/A;  . Video bronchoscopy with endobronchial ultrasound N/A 12/31/2012    Procedure: VIDEO BRONCHOSCOPY WITH ENDOBRONCHIAL ULTRASOUND;  Surgeon: Loreli Slot, MD;  Location: [facility] OR;  Service: Thoracic;  Laterality: N/A;    REVIEW OF SYSTEMS:  A comprehensive review of systems was negative.   PHYSICAL EXAMINATION: General appearance: alert, cooperative and no distress Head: Normocephalic, without obvious abnormality, atraumatic Neck: no adenopathy, no JVD, supple, symmetrical, trachea midline and thyroid not enlarged, symmetric, no tenderness/mass/nodules Lymph nodes: Cervical, supraclavicular, and axillary nodes normal. Resp: clear to auscultation bilaterally Back: symmetric, no curvature. ROM normal. No CVA tenderness. Cardio: regular rate and rhythm, S1, S2 normal, no murmur, click, rub or gallop GI: soft, non-tender; bowel sounds normal; no masses,  no  organomegaly Extremities: extremities normal, atraumatic, no cyanosis or edema  ECOG PERFORMANCE STATUS: 1 - Symptomatic but completely ambulatory  Blood pressure 133/82, pulse 70, temperature 96.9 F (36.1 C), temperature source Oral, resp. rate 18, height 5\' 6"  (1.676 m), weight 190 lb 1.6 oz (86.229 kg).  LABORATORY DATA: Lab Results  Component Value Date   WBC 4.9 02/15/2013   HGB 13.5 02/15/2013   HCT 39.9 02/15/2013   MCV 87.3 02/15/2013   PLT 205 02/15/2013      Chemistry      Component Value Date/Time   NA 140 02/15/2013 1157   NA 138 12/30/2012 1335   K 3.7 02/15/2013 1157   K 4.1 12/30/2012 1335   CL 101 12/30/2012 1335   CO2 25 02/15/2013 1157   CO2 28 12/30/2012 1335   BUN 8.3 02/15/2013 1157   BUN 8 12/30/2012 1335  CREATININE 0.7 02/15/2013 1157   CREATININE 0.86 12/30/2012 1335      Component Value Date/Time   CALCIUM 9.5 02/15/2013 1157   CALCIUM 9.2 12/30/2012 1335   ALKPHOS 90 02/15/2013 1157   ALKPHOS 89 12/30/2012 1335   AST 22 02/15/2013 1157   AST 20 12/30/2012 1335   ALT 27 02/15/2013 1157   ALT 17 12/30/2012 1335   BILITOT 0.99 02/15/2013 1157   BILITOT 0.7 12/30/2012 1335       RADIOGRAPHIC STUDIES: Mr Laqueta Jean ZO Contrast  01/31/13   CLINICAL DATA:  Non-small-cell lung cancer. Rule out metastatic disease.  EXAM: MRI HEAD WITHOUT AND WITH CONTRAST  TECHNIQUE: Multiplanar, multiecho pulse sequences of the brain and surrounding structures were obtained without and with intravenous contrast.  CONTRAST:  18mL MULTIHANCE GADOBENATE DIMEGLUMINE 529 MG/ML IV SOLN  COMPARISON:  None.  FINDINGS: Ventricle size is normal. Craniocervical junction is normal. No skull lesions are identified.  Small nonenhancing white matter hyperintensities, most consistent with chronic microvascular ischemia. Mild chronic changes in the pons.  Negative for acute infarct.  Negative for hemorrhage or mass.  Postcontrast imaging reveals normal enhancement. No metastatic deposits  are identified  IMPRESSION: Negative for metastatic disease.  Mild chronic microvascular ischemic change.   Electronically Signed   By: Marlan Palau M.D.   On: 01/31/2013 15:42   Ir Fluoro Guide Cv Line Right  Jan 31, 2013   CLINICAL DATA:  Lung carcinoma, needs venous access for chemotherapy  EXAM: TUNNELED PORT CATHETER PLACEMENT WITH ULTRASOUND AND FLUOROSCOPIC GUIDANCE  TECHNIQUE: The procedure, risks, benefits, and alternatives were explained to the patient. Questions regarding the procedure were encouraged and answered. The patient understands and consents to the procedure. As antibiotic prophylaxis, cefazolin 2 g was ordered pre-procedure and administered intravenously within one hour of incision. Patency of the right IJ vein was confirmed with ultrasound with image documentation. An appropriate skin site was determined. Skin site was marked. Region was prepped using maximum barrier technique including cap and mask, sterile gown, sterile gloves, large sterile sheet, and Chlorhexidine as cutaneous antisepsis. The region was infiltrated locally with 1% lidocaine. Under real-time ultrasound guidance, the right IJ vein was accessed with a 21 gauge micropuncture needle; the needle tip within the vein was confirmed with ultrasound image documentation. Needle was exchanged over a 018 guidewire for transitional dilator which allowed passage of the [facility] wire into the IVC. Over this, the transitional dilator was exchanged for a 5 [place] MPA catheter. A small incision was made on the right anterior chest wall and a subcutaneous pocket fashioned. The power-injectable port was positioned and its catheter tunneled to the right IJ dermatotomy site. The MPA catheter was exchanged over an Amplatz wire for a peel-away sheath, through which the port catheter, which had been trimmed to the appropriate length, was advanced and positioned under fluoroscopy with its tip at the cavoatrial junction. Spot chest radiograph  confirms good catheter position and no pneumothorax. The pocket was closed with deep interrupted and subcuticular continuous 3-0 Monocryl sutures. The port was flushed per protocol. The incisions were covered with Dermabond then covered with a sterile dressing. No immediate complication.  ANESTHESIA/SEDATION: Intravenous Fentanyl and Versed were administered as conscious sedation during continuous cardiorespiratory monitoring by the radiology RN, with a total moderate sedation time of twenty minutes.  FLUOROSCOPY TIME:  10960 seconds  IMPRESSION: Technically successful right IJ power-injectable port catheter placement. Ready for routine use.   Electronically Signed   By: Kerry Kass.D.  On: 01/20/2013 15:01   Ir US Guide Vasc Access Right  01/20/2013   CLINICAL DATA:  Lung carcinoma, needs venous access for chemotherapy  EXAM: TUNNELED PORT CATHETER PLACEMENT WITH ULTRASOUND AND FLUOROSCOPIC GUIDANCE  TECHNIQUE: The procedure, risks, benefits, and alternatives were explained to the patient. Questions regarding the procedure were encouraged and answered. The patient understands and consents to the procedure. As antibiotic prophylaxis, cefazolin 2 g was ordered pre-procedure and administered intravenously within one hour of incision. Patency of the right IJ vein was confirmed with ultrasound with image documentation. An appropriate skin site was determined. Skin site was marked. Region was prepped using maximum barrier technique including cap and mask, sterile gown, sterile gloves, large sterile sheet, and Chlorhexidine as cutaneous antisepsis. The region was infiltrated locally with 1% lidocaine. Under real-time ultrasound guidance, the right IJ vein was accessed with a 21 gauge micropuncture needle; the needle tip within the vein was confirmed with ultrasound image documentation. Needle was exchanged over a 018 guidewire for transitional dilator which allowed passage of the [facility] wire into the IVC. Over  this, the transitional dilator was exchanged for a 5 [place] MPA catheter. A small incision was made on the right anterior chest wall and a subcutaneous pocket fashioned. The power-injectable port was positioned and its catheter tunneled to the right IJ dermatotomy site. The MPA catheter was exchanged over an Amplatz wire for a peel-away sheath, through which the port catheter, which had been trimmed to the appropriate length, was advanced and positioned under fluoroscopy with its tip at the cavoatrial junction. Spot chest radiograph confirms good catheter position and no pneumothorax. The pocket was closed with deep interrupted and subcuticular continuous 3-0 Monocryl sutures. The port was flushed per protocol. The incisions were covered with Dermabond then covered with a sterile dressing. No immediate complication.  ANESTHESIA/SEDATION: Intravenous Fentanyl and Versed were administered as conscious sedation during continuous cardiorespiratory monitoring by the radiology RN, with a total moderate sedation time of twenty minutes.  FLUOROSCOPY TIME:  16109 seconds  IMPRESSION: Technically successful right IJ power-injectable port catheter placement. Ready for routine use.   Electronically Signed   By: Oley Balm M.D.   On: 01/20/2013 15:01   Ct Biopsy  01/11/2013   CLINICAL DATA:  1.7 cm hypermetabolic right upper lobe lung nodule. Previous bronchoscopy demonstrated a single cluster of atypical cells which was not adequate for definitive diagnosis. The patient has been referred for percutaneous biopsy.  EXAM: CT GUIDED CORE BIOPSY OF RIGHT LUNG  ANESTHESIA/SEDATION: 2.0  Mg IV Versed; 150 mcg IV Fentanyl  Total Moderate Sedation Time: 32 minutes.  PROCEDURE: The procedure risks, benefits, and alternatives were explained to the patient. Questions regarding the procedure were encouraged and answered. The patient understands and consents to the procedure.  The right anterior chest wall was prepped with  Betadinein a sterile fashion, and a sterile drape was applied covering the operative field. A sterile gown and sterile gloves were used for the procedure. Local anesthesia was provided with 1% Lidocaine.  Localizing CT was performed in a supine position. Under CT guidance, a 17 gauge trocar needle was advanced to the margin of the right upper lobe lung nodule. After confirming needle tip position, a total of 2 separate 18 gauge core biopsy samples were obtained. Post biopsy CT was performed after the outer needle removal.  Complications: Tiny amount of pleural air.  FINDINGS: Imaging demonstrates the anterior right upper lobe nodule measuring roughly 1.7 cm in greatest diameter. Antral lateral approach  was performed. Core biopsy yielded solid tissue. During the procedure, a small amount of adjacent pleural air became evident. This will be followed by a chest x-ray 2 hr after the procedure.  IMPRESSION: CT-guided core biopsy performed of a 1.7 cm right upper lobe lung nodule. Solid tissue was obtained. Tiny pleural air/pneumothorax was present on biopsy completion which will be followed by chest x-ray.   Electronically Signed   By: Irish Lack M.D.   On: 01/11/2013 13:23   Dg Chest Port 1 View  01/11/2013   CLINICAL DATA:  Status post lung biopsy.  EXAM: PORTABLE CHEST - 1 VIEW  COMPARISON:  12/30/2012.  FINDINGS: No postprocedural pneumothorax is identified. The heart and lungs are stable.  IMPRESSION: No postprocedural pneumothorax.   Electronically Signed   By: Loralie Champagne M.D.   On: 01/11/2013 15:30    ASSESSMENT AND PLAN: This is a very pleasant 67 years old white female recently diagnosed with a stage IIIa non-small cell lung cancer currently undergoing concurrent chemoradiation with weekly carboplatin and paclitaxel status post 3 weekly treatments. She is tolerating her treatment fairly well with no significant adverse effects. Patient was discussed with him also seen by Dr. Arbutus Ped. She will  continue with her course of concurrent chemoradiation as scheduled. She'll return in 3 weeks for another symptom management visit. She'll be given a flu vaccine today.  She was advised to call immediately if she has any concerning symptoms in the interval.  The patient voices understanding of current disease status and treatment options and is in agreement with the current care plan.  All questions were answered. The patient knows to call the clinic with any problems, questions or concerns. We can certainly see the patient much sooner if necessary.  Dawn Slipper PA-C  ADDENDUM: Hematology/Oncology Attending: I had the face to face encounter with the patient. I recommended for her care plan. This is a very pleasant 67 years old white female with a stage IIIA non-small cell lung cancer currently undergoing concurrent chemoradiation with weekly carboplatin and paclitaxel status post 3 cycles. She is tolerating her treatment fairly well with no significant adverse effects. The patient denied having any fever or chills. She has no nausea or vomiting. We'll proceed today with cycle #4 as scheduled. The patient requested a flu vaccine today. She would come back for follow up visit in 3 weeks for evaluation. She was advised to call immediately if she has any concerning symptoms in the interval. Dawn Matte., MD 02/15/2013

## 2013-02-15 NOTE — Patient Instructions (Signed)
Continue with labs and chemotherapy as scheduled You'll be given your flu vaccine today Followup in 3 weeks

## 2013-02-15 NOTE — Patient Instructions (Signed)
[facility] Discharge Instructions for Patients Receiving Chemotherapy  Today you received the following chemotherapy agents Taxol/Carboplatin  To help prevent nausea and vomiting after your treatment, we encourage you to take your nausea medication as directed.   If you develop nausea and vomiting that is not controlled by your nausea medication, call the clinic.   BELOW ARE SYMPTOMS THAT SHOULD BE REPORTED IMMEDIATELY:  *FEVER GREATER THAN 100.5 F  *CHILLS WITH OR WITHOUT FEVER  NAUSEA AND VOMITING THAT IS NOT CONTROLLED WITH YOUR NAUSEA MEDICATION  *UNUSUAL SHORTNESS OF BREATH  *UNUSUAL BRUISING OR BLEEDING  TENDERNESS IN MOUTH AND THROAT WITH OR WITHOUT PRESENCE OF ULCERS  *URINARY PROBLEMS  *BOWEL PROBLEMS  UNUSUAL RASH Items with * indicate a potential emergency and should be followed up as soon as possible.  Feel free to call the clinic you have any questions or concerns. The clinic phone number is [phone].    

## 2013-02-16 ENCOUNTER — Ambulatory Visit
Admission: RE | Admit: 2013-02-16 | Discharge: 2013-02-16 | Disposition: A | Discharge status: Home / Self Care | Payer: Medicare Other | Source: Ambulatory Visit | Attending: Radiation Oncology | Admitting: Radiation Oncology

## 2013-02-16 NOTE — Progress Notes (Signed)
[facility]    Radiation Oncology [address] [place]     Maryln Gottron, M.D. [place], [place] [address]               Billie Lade, M.D., Ph.D. Phone: [phone]      Molli Hazard A. Kathrynn Running, M.D. Fax: [phone]      Radene Gunning, M.D., Ph.D.         Lurline Hare, M.D.         Grayland Jack, M.D Weekly Treatment Management Note  Name: Dawn Mercer     MRN: [id number]        CSN: [id number] Date: 02/16/2013      DOB: 10/28/1945  CC:  Duane Lope, MD         Tenny Craw    Status: Outpatient  Diagnosis: The encounter diagnosis was Malignant neoplasm of upper lobe, bronchus or lung, right.  Current Dose: 25.2 Gy  Current Fraction: 14  Planned Dose: 63 Gy  Narrative: Dawn Mercer was seen today for weekly treatment management. The chart was checked and CBCT  were reviewed. She continues to tolerate the treatments well this time. She denies any significant fatigue. She does have some mild esophageal symptoms. She will be more consistent taking her Carafate. She is able to drink liquids and any solid food at this time. She has a dry cough which she has had prior to starting radiation. She denies any breathing problems.  Latex and Codeine  Current Outpatient Prescriptions  Medication Sig Dispense Refill  . albuterol (PROVENTIL HFA;VENTOLIN HFA) 108 (90 BASE) MCG/ACT inhaler Inhale 1 puff into the lungs every 6 (six) hours as needed for wheezing.      Marland Kitchen ALPRAZolam (XANAX) 0.5 MG tablet Take 0.25 mg by mouth 2 (two) times daily as needed for anxiety.      . Cholecalciferol (VITAMIN D-3) 5000 UNITS TABS Take 5,000 Units by mouth 2 (two) times a week.      . diphenoxylate-atropine (LOMOTIL) 2.5-0.025 MG per tablet Take 1 tablet by mouth every Sunday.      . esomeprazole (NEXIUM) 40 MG capsule Take 40 mg by mouth daily before breakfast.      . levothyroxine (SYNTHROID, LEVOTHROID) 112 MCG tablet Take 112 mcg by mouth daily before breakfast.      .  lidocaine-prilocaine (EMLA) cream Apply 1 application topically as needed. Apply to port 1 hour before chemo appt      . LORazepam (ATIVAN) 1 MG tablet One tab po 30 min before MRI  1 tablet  0  . sucralfate (CARAFATE) 1 GM/10ML suspension Take 10 mLs (1 g total) by mouth 4 (four) times daily -  with meals and at bedtime.  420 mL  1  . temazepam (RESTORIL) 15 MG capsule Take 1 capsule (15 mg total) by mouth at bedtime as needed for sleep.  30 capsule  0  . traMADol (ULTRAM) 50 MG tablet Take 1 tablet (50 mg total) by mouth every 6 (six) hours as needed for moderate pain or severe pain.  50 tablet  0  . Wound Cleansers (RADIAPLEX EX) Apply 1 application topically 2 (two) times daily.      . clidinium-chlordiazePOXIDE (LIBRAX) 5-2.5 MG per capsule Take 1 capsule by mouth daily as needed (for upset stomach).      . prochlorperazine (COMPAZINE) 10 MG tablet Take 10 mg by mouth every 6 (six) hours as needed for nausea or vomiting.       No  current facility-administered medications for this encounter.   Labs:  Lab Results  Component Value Date   WBC 4.9 02/15/2013   HGB 13.5 02/15/2013   HCT 39.9 02/15/2013   MCV 87.3 02/15/2013   PLT 205 02/15/2013   Lab Results  Component Value Date   CREATININE 0.7 02/15/2013   BUN 8.3 02/15/2013   NA 140 02/15/2013   K 3.7 02/15/2013   CL 101 12/30/2012   CO2 25 02/15/2013   Lab Results  Component Value Date   ALT 27 02/15/2013   AST 22 02/15/2013   BILITOT 0.99 02/15/2013    Physical Examination:  Filed Vitals:   02/16/13 1142  BP: 114/71  Pulse: 83  Temp: 98.1 F (36.7 C)    Wt Readings from Last 3 Encounters:  02/16/13 191 lb 11.2 oz (86.955 kg)  02/15/13 190 lb 1.6 oz (86.229 kg)  02/09/13 192 lb 11.2 oz (87.408 kg)     Lungs - Normal respiratory effort, chest expands symmetrically. Lungs are clear to auscultation, no crackles or wheezes.  Heart has regular rhythm and rate  Abdomen is soft and non tender with normal bowel  sounds  Assessment:  Patient tolerating treatments well  Plan: Continue treatment per original radiation prescription

## 2013-02-16 NOTE — Progress Notes (Signed)
Dawn Mercer has had 14 fractions to her right chest.  She denies pain.  She has a cough that is not any worse than when she started.  She denies shortness of breath.  She reports that she has burning in her chest with swallowing.  She is using Carafate.  She can still eat what she wants.  The skin on her right chest is intact with two red spots with slight hyperpigmentation underneath her right arm.  She reports that her skin is feeling irritated under her right arm.  She is using radiale.

## 2013-02-17 ENCOUNTER — Ambulatory Visit
Admission: RE | Admit: 2013-02-17 | Discharge: 2013-02-17 | Disposition: A | Discharge status: Home / Self Care | Payer: Medicare Other | Source: Ambulatory Visit | Attending: Radiation Oncology | Admitting: Radiation Oncology

## 2013-02-19 ENCOUNTER — Ambulatory Visit: Payer: Medicare Other

## 2013-02-22 ENCOUNTER — Ambulatory Visit
Admission: RE | Admit: 2013-02-22 | Discharge: 2013-02-22 | Disposition: A | Discharge status: Home / Self Care | Payer: Medicare Other | Source: Ambulatory Visit | Attending: Radiation Oncology | Admitting: Radiation Oncology

## 2013-02-22 ENCOUNTER — Other Ambulatory Visit: Payer: Medicare Other | Admitting: Lab

## 2013-02-22 ENCOUNTER — Encounter: Payer: Self-pay | Admitting: *Deleted

## 2013-02-22 ENCOUNTER — Other Ambulatory Visit (HOSPITAL_BASED_OUTPATIENT_CLINIC_OR_DEPARTMENT_OTHER): Payer: Medicare Other

## 2013-02-22 ENCOUNTER — Ambulatory Visit (HOSPITAL_BASED_OUTPATIENT_CLINIC_OR_DEPARTMENT_OTHER): Payer: Medicare Other

## 2013-02-22 DIAGNOSIS — C3410 Malignant neoplasm of upper lobe, unspecified bronchus or lung: Secondary | ICD-10-CM

## 2013-02-22 DIAGNOSIS — Z5111 Encounter for antineoplastic chemotherapy: Secondary | ICD-10-CM

## 2013-02-22 DIAGNOSIS — C3491 Malignant neoplasm of unspecified part of right bronchus or lung: Secondary | ICD-10-CM

## 2013-02-22 LAB — COMPREHENSIVE METABOLIC PANEL (CC13)
ALT: 26 U/L (ref 0–55)
AST: 21 U/L (ref 5–34)
Albumin: 3.6 g/dL (ref 3.5–5.0)
Alkaline Phosphatase: 79 U/L (ref 40–150)
BUN: 9.8 mg/dL (ref 7.0–26.0)
Calcium: 9.4 mg/dL (ref 8.4–10.4)
Chloride: 106 mEq/L (ref 98–109)
Creatinine: 0.8 mg/dL (ref 0.6–1.1)
Glucose: 96 mg/dl (ref 70–140)
Potassium: 3.7 mEq/L (ref 3.5–5.1)
Total Bilirubin: 1.1 mg/dL (ref 0.20–1.20)

## 2013-02-22 LAB — CBC WITH DIFFERENTIAL/PLATELET
BASO%: 0.6 % (ref 0.0–2.0)
Basophils Absolute: 0 10*3/uL (ref 0.0–0.1)
EOS%: 0.6 % (ref 0.0–7.0)
HCT: 38.7 % (ref 34.8–46.6)
MCH: 29.5 pg (ref 25.1–34.0)
MCHC: 33.3 g/dL (ref 31.5–36.0)
MCV: 88.4 fL (ref 79.5–101.0)
MONO#: 0.3 10*3/uL (ref 0.1–0.9)
MONO%: 9.6 % (ref 0.0–14.0)
NEUT#: 2.7 10*3/uL (ref 1.5–6.5)
NEUT%: 77.3 % — ABNORMAL HIGH (ref 38.4–76.8)
RBC: 4.38 10*6/uL (ref 3.70–5.45)
RDW: 15.4 % — ABNORMAL HIGH (ref 11.2–14.5)
WBC: 3.5 10*3/uL — ABNORMAL LOW (ref 3.9–10.3)
lymph#: 0.4 10*3/uL — ABNORMAL LOW (ref 0.9–3.3)

## 2013-02-22 MED ORDER — HEPARIN SOD (PORK) LOCK FLUSH 100 UNIT/ML IV SOLN
500.0000 [IU] | Freq: Once | INTRAVENOUS | Status: AC | PRN
  Administered 2013-02-22: 500 [IU]
  Filled 2013-02-22: qty 5

## 2013-02-22 MED ORDER — DEXAMETHASONE SODIUM PHOSPHATE 20 MG/5ML IJ SOLN
INTRAMUSCULAR | Status: AC
  Filled 2013-02-22: qty 5

## 2013-02-22 MED ORDER — SODIUM CHLORIDE 0.9 % IV SOLN
45.0000 mg/m2 | Freq: Once | INTRAVENOUS | Status: AC
  Administered 2013-02-22: 90 mg via INTRAVENOUS
  Filled 2013-02-22: qty 15

## 2013-02-22 MED ORDER — SODIUM CHLORIDE 0.9 % IV SOLN
Freq: Once | INTRAVENOUS | Status: AC
  Administered 2013-02-22: 09:00:00 via INTRAVENOUS

## 2013-02-22 MED ORDER — ONDANSETRON 16 MG/50ML IVPB (CHCC)
16.0000 mg | Freq: Once | INTRAVENOUS | Status: AC
  Administered 2013-02-22: 16 mg via INTRAVENOUS

## 2013-02-22 MED ORDER — DIPHENHYDRAMINE HCL 50 MG/ML IJ SOLN
25.0000 mg | Freq: Once | INTRAMUSCULAR | Status: AC
  Administered 2013-02-22: 25 mg via INTRAVENOUS

## 2013-02-22 MED ORDER — FAMOTIDINE IN NACL 20-0.9 MG/50ML-% IV SOLN
20.0000 mg | Freq: Once | INTRAVENOUS | Status: AC
  Administered 2013-02-22: 20 mg via INTRAVENOUS

## 2013-02-22 MED ORDER — ONDANSETRON 16 MG/50ML IVPB (CHCC)
INTRAVENOUS | Status: AC
  Filled 2013-02-22: qty 16

## 2013-02-22 MED ORDER — FAMOTIDINE IN NACL 20-0.9 MG/50ML-% IV SOLN
INTRAVENOUS | Status: AC
  Filled 2013-02-22: qty 50

## 2013-02-22 MED ORDER — SODIUM CHLORIDE 0.9 % IJ SOLN
10.0000 mL | INTRAMUSCULAR | Status: DC | PRN
  Administered 2013-02-22: 10 mL
  Filled 2013-02-22: qty 10

## 2013-02-22 MED ORDER — DIPHENHYDRAMINE HCL 50 MG/ML IJ SOLN
INTRAMUSCULAR | Status: AC
  Filled 2013-02-22: qty 1

## 2013-02-22 MED ORDER — DEXAMETHASONE SODIUM PHOSPHATE 20 MG/5ML IJ SOLN
20.0000 mg | Freq: Once | INTRAMUSCULAR | Status: AC
  Administered 2013-02-22: 20 mg via INTRAVENOUS

## 2013-02-22 MED ORDER — SODIUM CHLORIDE 0.9 % IV SOLN
200.0000 mg | Freq: Once | INTRAVENOUS | Status: AC
  Administered 2013-02-22: 200 mg via INTRAVENOUS
  Filled 2013-02-22: qty 20

## 2013-02-22 NOTE — Patient Instructions (Signed)
[facility] Discharge Instructions for Patients Receiving Chemotherapy  Today you received the following chemotherapy agents taxol/carbo  To help prevent nausea and vomiting after your treatment, we encourage you to take your nausea medication as directed.     If you develop nausea and vomiting that is not controlled by your nausea medication, call the clinic.   BELOW ARE SYMPTOMS THAT SHOULD BE REPORTED IMMEDIATELY: *FEVER GREATER THAN 100.5 F *CHILLS WITH OR WITHOUT FEVER NAUSEA AND VOMITING THAT IS NOT CONTROLLED WITH YOUR NAUSEA MEDICATION *UNUSUAL SHORTNESS OF BREATH *UNUSUAL BRUISING OR BLEEDING TENDERNESS IN MOUTH AND THROAT WITH OR WITHOUT PRESENCE OF ULCERS *URINARY PROBLEMS *BOWEL PROBLEMS UNUSUAL RASH Items with * indicate a potential emergency and should be followed up as soon as possible.  Feel free to call the clinic you have any questions or concerns. The clinic phone number is [phone].  

## 2013-02-22 NOTE — Progress Notes (Signed)
Spoke with pt in chemo today. I listened as she talked about the affects of her chemo.  No needs identified.

## 2013-02-23 ENCOUNTER — Ambulatory Visit
Admission: RE | Admit: 2013-02-23 | Discharge: 2013-02-23 | Disposition: A | Discharge status: Home / Self Care | Payer: Medicare Other | Source: Ambulatory Visit | Attending: Radiation Oncology | Admitting: Radiation Oncology

## 2013-02-23 NOTE — Progress Notes (Signed)
Weekly Management Note Current Dose:  30.6 Gy  Projected Dose: 63 Gy   Narrative:  The patient presents for routine under treatment assessment.  CBCT/MVCT images/Port film x-rays were reviewed.  The chart was checked. Doing well. No dysphagia. Using radiaplex for rash on back. Some rash on arms.   Physical Findings: Weight: 189 lb (85.73 kg). Dermatitis over back in treatment field.   Impression:  The patient is tolerating radiation.  Plan:  Continue treatment as planned. Continue radiaplex.

## 2013-02-23 NOTE — Progress Notes (Signed)
Dawn Mercer has had 17 fractions to her right chest.  She has a cough that she report is not any worse than when she started radiation.  She denies shortness of breath and a sore throat.  She has scattered red blisters on both of her arms. She thinks it is from the chemotherapy.  The skin on her right chest and back is red.  She has once scabbed area on her right upper back.  She is using radiaplex gel.  She also reports that her hair is starting to fall out and is wondering if she can get a prescription for the wig shop.

## 2013-02-24 ENCOUNTER — Ambulatory Visit
Admission: RE | Admit: 2013-02-24 | Discharge: 2013-02-24 | Disposition: A | Discharge status: Home / Self Care | Payer: Medicare Other | Source: Ambulatory Visit | Attending: Radiation Oncology | Admitting: Radiation Oncology

## 2013-02-26 ENCOUNTER — Ambulatory Visit: Payer: Medicare Other

## 2013-02-26 ENCOUNTER — Telehealth: Payer: Self-pay | Admitting: Oncology

## 2013-02-26 NOTE — Telephone Encounter (Signed)
Called Dawn Mercer due to missing her radiation treatment today.  She said she was not sure who to call to cancel her treatment.  She said she thinks she has a virus or ate something bad and has had diarrhea since yesterday.  She said she is feeling a little better.  Advised her that she can take Imodium over the counter and to drink a lot of fluids.  Gave her the phone number for nursing and asked that she call with any questions or concerns.

## 2013-03-01 ENCOUNTER — Ambulatory Visit
Admission: RE | Admit: 2013-03-01 | Discharge: 2013-03-01 | Disposition: A | Discharge status: Home / Self Care | Payer: Medicare Other | Source: Ambulatory Visit | Attending: Radiation Oncology | Admitting: Radiation Oncology

## 2013-03-01 ENCOUNTER — Other Ambulatory Visit (HOSPITAL_BASED_OUTPATIENT_CLINIC_OR_DEPARTMENT_OTHER): Payer: Medicare Other

## 2013-03-01 ENCOUNTER — Ambulatory Visit (HOSPITAL_BASED_OUTPATIENT_CLINIC_OR_DEPARTMENT_OTHER): Payer: Medicare Other

## 2013-03-01 DIAGNOSIS — Z5111 Encounter for antineoplastic chemotherapy: Secondary | ICD-10-CM

## 2013-03-01 DIAGNOSIS — C3410 Malignant neoplasm of upper lobe, unspecified bronchus or lung: Secondary | ICD-10-CM

## 2013-03-01 DIAGNOSIS — C771 Secondary and unspecified malignant neoplasm of intrathoracic lymph nodes: Secondary | ICD-10-CM

## 2013-03-01 DIAGNOSIS — C3491 Malignant neoplasm of unspecified part of right bronchus or lung: Secondary | ICD-10-CM

## 2013-03-01 LAB — CBC WITH DIFFERENTIAL/PLATELET
BASO%: 0.6 % (ref 0.0–2.0)
Basophils Absolute: 0 10*3/uL (ref 0.0–0.1)
EOS ABS: 0.1 10*3/uL (ref 0.0–0.5)
EOS%: 1.3 % (ref 0.0–7.0)
HCT: 41.5 % (ref 34.8–46.6)
HEMOGLOBIN: 14.7 g/dL (ref 11.6–15.9)
LYMPH#: 1 10*3/uL (ref 0.9–3.3)
LYMPH%: 18.7 % (ref 14.0–49.7)
MCH: 30.1 pg (ref 25.1–34.0)
MCHC: 35.4 g/dL (ref 31.5–36.0)
MCV: 84.9 fL (ref 79.5–101.0)
MONO#: 0.6 10*3/uL (ref 0.1–0.9)
MONO%: 10.6 % (ref 0.0–14.0)
NEUT%: 68.8 % (ref 38.4–76.8)
NEUTROS ABS: 3.6 10*3/uL (ref 1.5–6.5)
NRBC: 0 % (ref 0–0)
Platelets: 146 10*3/uL (ref 145–400)
RBC: 4.89 10*6/uL (ref 3.70–5.45)
RDW: 15.7 % — AB (ref 11.2–14.5)
WBC: 5.3 10*3/uL (ref 3.9–10.3)

## 2013-03-01 LAB — COMPREHENSIVE METABOLIC PANEL (CC13)
ALK PHOS: 94 U/L (ref 40–150)
ALT: 42 U/L (ref 0–55)
ANION GAP: 14 meq/L — AB (ref 3–11)
AST: 30 U/L (ref 5–34)
Albumin: 3.7 g/dL (ref 3.5–5.0)
BUN: 26.3 mg/dL — ABNORMAL HIGH (ref 7.0–26.0)
CO2: 16 meq/L — AB (ref 22–29)
Calcium: 9.4 mg/dL (ref 8.4–10.4)
Chloride: 108 mEq/L (ref 98–109)
Creatinine: 1.2 mg/dL — ABNORMAL HIGH (ref 0.6–1.1)
Glucose: 98 mg/dl (ref 70–140)
Potassium: 3.2 mEq/L — ABNORMAL LOW (ref 3.5–5.1)
SODIUM: 139 meq/L (ref 136–145)
Total Bilirubin: 1.12 mg/dL (ref 0.20–1.20)
Total Protein: 7.7 g/dL (ref 6.4–8.3)

## 2013-03-01 MED ORDER — DIPHENHYDRAMINE HCL 50 MG/ML IJ SOLN
25.0000 mg | Freq: Once | INTRAMUSCULAR | Status: AC
  Administered 2013-03-01: 25 mg via INTRAVENOUS

## 2013-03-01 MED ORDER — ONDANSETRON 16 MG/50ML IVPB (CHCC)
INTRAVENOUS | Status: AC
Start: 2013-03-01 — End: 2013-03-01
  Filled 2013-03-01: qty 16

## 2013-03-01 MED ORDER — CARBOPLATIN CHEMO INJECTION 450 MG/45ML
201.2000 mg | Freq: Once | INTRAVENOUS | Status: AC
  Administered 2013-03-01: 200 mg via INTRAVENOUS
  Filled 2013-03-01: qty 20

## 2013-03-01 MED ORDER — DIPHENHYDRAMINE HCL 50 MG/ML IJ SOLN
INTRAMUSCULAR | Status: AC
  Filled 2013-03-01: qty 1

## 2013-03-01 MED ORDER — SODIUM CHLORIDE 0.9 % IV SOLN
Freq: Once | INTRAVENOUS | Status: AC
  Administered 2013-03-01: 13:00:00 via INTRAVENOUS

## 2013-03-01 MED ORDER — DEXAMETHASONE SODIUM PHOSPHATE 20 MG/5ML IJ SOLN
20.0000 mg | Freq: Once | INTRAMUSCULAR | Status: AC
  Administered 2013-03-01: 20 mg via INTRAVENOUS

## 2013-03-01 MED ORDER — FAMOTIDINE IN NACL 20-0.9 MG/50ML-% IV SOLN
INTRAVENOUS | Status: AC
  Filled 2013-03-01: qty 50

## 2013-03-01 MED ORDER — SODIUM CHLORIDE 0.9 % IV SOLN
45.0000 mg/m2 | Freq: Once | INTRAVENOUS | Status: AC
  Administered 2013-03-01: 90 mg via INTRAVENOUS
  Filled 2013-03-01: qty 15

## 2013-03-01 MED ORDER — FAMOTIDINE IN NACL 20-0.9 MG/50ML-% IV SOLN
20.0000 mg | Freq: Once | INTRAVENOUS | Status: AC
  Administered 2013-03-01: 20 mg via INTRAVENOUS

## 2013-03-01 MED ORDER — SODIUM CHLORIDE 0.9 % IJ SOLN
10.0000 mL | INTRAMUSCULAR | Status: DC | PRN
  Administered 2013-03-01: 10 mL
  Filled 2013-03-01: qty 10

## 2013-03-01 MED ORDER — ONDANSETRON 16 MG/50ML IVPB (CHCC)
16.0000 mg | Freq: Once | INTRAVENOUS | Status: AC
  Administered 2013-03-01: 16 mg via INTRAVENOUS

## 2013-03-01 MED ORDER — DEXAMETHASONE SODIUM PHOSPHATE 20 MG/5ML IJ SOLN
INTRAMUSCULAR | Status: AC
  Filled 2013-03-01: qty 5

## 2013-03-01 MED ORDER — HEPARIN SOD (PORK) LOCK FLUSH 100 UNIT/ML IV SOLN
500.0000 [IU] | Freq: Once | INTRAVENOUS | Status: AC | PRN
  Administered 2013-03-01: 500 [IU]
  Filled 2013-03-01: qty 5

## 2013-03-01 NOTE — Progress Notes (Signed)
Patient complains of 5 to 6 watery stools per day for the last 4 days. Patient states she has tried Imodium, and her IBS medications without relief. Patient states she has not been able to eat due to a lack of appetite. Patient states she has been drinking fluids, but feels as if " the fluids are going right through her." Patient denies nausea, vomiting, chest pain, shortness of breath., fever, or chills. Patient states she hasn't been around anyone sick that she knows of. Patient states she did not call the clinic to make [place] aware of the current concerns.   Patient also complains of a red, non raised, non itching rash on her lower bilateral forearms. Patient states the rash appeared last week, and she just happened to notice it.  Dr. Julien Nordmann notified, ok to proceed with treatment patient is to follow up with her Gastroenterologist for the diarrhea, and to use over the counter hydrocortisone cream for her rash on her arms. Patient verbalized understanding to the above plan.

## 2013-03-01 NOTE — Progress Notes (Signed)
Quick Note:  Call patient with the result and Encourage K rich diet ______

## 2013-03-02 ENCOUNTER — Telehealth: Payer: Self-pay | Admitting: *Deleted

## 2013-03-02 ENCOUNTER — Ambulatory Visit
Admission: RE | Admit: 2013-03-02 | Discharge: 2013-03-02 | Disposition: A | Discharge status: Home / Self Care | Payer: Medicare Other | Source: Ambulatory Visit | Attending: Radiation Oncology | Admitting: Radiation Oncology

## 2013-03-02 ENCOUNTER — Ambulatory Visit: Payer: Medicare Other | Admitting: Radiation Oncology

## 2013-03-02 ENCOUNTER — Encounter: Payer: Self-pay | Admitting: Radiation Oncology

## 2013-03-02 NOTE — Telephone Encounter (Signed)
Called and informed patient of low potassium and to increase her potassium intake (Bananas, dark leafy green vegetable, etc.)  Per Dr. Julien Nordmann.  Patient verbalized understanding.

## 2013-03-02 NOTE — Telephone Encounter (Signed)
Message copied by Norma Fredrickson on Tue Mar 02, 2013 11:33 AM ------      Message from: Britt Bottom      Created: Tue Mar 02, 2013  9:18 AM                   ----- Message -----         From: Curt Bears, MD         Sent: 03/01/2013   6:23 PM           To: Carlton Adam, PA-C, #            Call patient with the result and Encourage K rich diet ------

## 2013-03-02 NOTE — Progress Notes (Signed)
Weekly rad txs, rt chest, 20/35 completed, erythema on back,rash like scratched scant open area, front mild erythema,. itches, using biafine cream, also rash red spots on right arm, but switched to a gingerbread soap  And that's when red spots started on her arms, told her to start using dove soap skin sensitive instead,   coughing up whitish pheglm, congestive cough,hher main c/o diarrhea, had chemotherapy yesterday and stated,not from chemotherapy per med./onc MD, has had diarrhea since new year, has used child imodium Saturday and Sunday, 3x, switched to calpectate yesterday, has had 2 episodes diarrhea this am at home and 2 here now, has had 2 episodes today as well, gave low fiber diet, asked to stop drinking ensure as well, that can casue diarrhea too  no pain 12:15 PM

## 2013-03-02 NOTE — Telephone Encounter (Signed)
Left message for patient to call back regarding lab results.

## 2013-03-02 NOTE — Progress Notes (Signed)
[facility]     Rexene Edison, M.D. [place], [place] [address]               Blair Promise, M.D., Ph.D. Phone: [phone]      Rodman Key A. Tammi Klippel, M.D. Fax: [phone]      Jodelle Gross, M.D., Ph.D.         Thea Silversmith, M.D.         Wyvonnia Lora, M.D Weekly Treatment Management Note  Name: Dawn Mercer     MRN: [id number]        CSN: [id number] Date: 03/02/2013      DOB: May 18, 1945  CC:  Melinda Crutch, MD         Harrington Challenger    Status: Outpatient  Diagnosis: The encounter diagnosis was Malignant neoplasm of upper lobe, bronchus or lung, right.  Current Dose: 36 Gy   Current Fraction: 20  Planned Dose: 63 Gy  Narrative: Dawn Mercer was seen today for weekly treatment management. The chart was checked and CBCT  were reviewed. She has had some problems with diarrhea over the weekend. Patient is likely picked up the viral gastroenteritis. She will start using Imodium for this issue. Kaopectate has not been very helpful. She denies any significant problems with swallowing. She continues to use Carafate. She is using Biafine for her skin reaction.  Latex and Codeine Current Outpatient Prescriptions  Medication Sig Dispense Refill  . albuterol (PROVENTIL HFA;VENTOLIN HFA) 108 (90 BASE) MCG/ACT inhaler Inhale 1 puff into the lungs every 6 (six) hours as needed for wheezing.      Marland Kitchen ALPRAZolam (XANAX) 0.5 MG tablet Take 0.25 mg by mouth 2 (two) times daily as needed for anxiety.      . Cholecalciferol (VITAMIN D-3) 5000 UNITS TABS Take 5,000 Units by mouth 2 (two) times a week.      . clidinium-chlordiazePOXIDE (LIBRAX) 5-2.5 MG per capsule Take 1 capsule by mouth daily as needed (for upset stomach).      . diphenoxylate-atropine (LOMOTIL) 2.5-0.025 MG per tablet Take 1 tablet by mouth every Sunday.      . esomeprazole (NEXIUM) 40 MG capsule Take 40 mg by mouth daily before breakfast.      . levothyroxine (SYNTHROID,  LEVOTHROID) 112 MCG tablet Take 112 mcg by mouth daily before breakfast.      . lidocaine-prilocaine (EMLA) cream Apply 1 application topically as needed. Apply to port 1 hour before chemo appt      . LORazepam (ATIVAN) 1 MG tablet One tab po 30 min before MRI  1 tablet  0  . prochlorperazine (COMPAZINE) 10 MG tablet Take 10 mg by mouth every 6 (six) hours as needed for nausea or vomiting.      . sucralfate (CARAFATE) 1 GM/10ML suspension Take 10 mLs (1 g total) by mouth 4 (four) times daily -  with meals and at bedtime.  420 mL  1  . temazepam (RESTORIL) 15 MG capsule Take 1 capsule (15 mg total) by mouth at bedtime as needed for sleep.  30 capsule  0  . traMADol (ULTRAM) 50 MG tablet Take 1 tablet (50 mg total) by mouth every 6 (six) hours as needed for moderate pain or severe pain.  50 tablet  0  . Wound Cleansers (RADIAPLEX EX) Apply 1 application topically 2 (two) times daily.       No current facility-administered medications for this encounter.  Labs:  Lab Results  Component Value Date   WBC 5.3 03/01/2013   HGB 14.7 03/01/2013   HCT 41.5 03/01/2013   MCV 84.9 03/01/2013   PLT 146 03/01/2013   Lab Results  Component Value Date   CREATININE 1.2* 03/01/2013   BUN 26.3* 03/01/2013   NA 139 03/01/2013   K 3.2* 03/01/2013   CL 101 12/30/2012   CO2 16* 03/01/2013   Lab Results  Component Value Date   ALT 42 03/01/2013   AST 30 03/01/2013   BILITOT 1.12 03/01/2013    Physical Examination:  There were no vitals filed for this visit.  Wt Readings from Last 3 Encounters:  03/02/13 183 lb 9.6 oz (83.28 kg)  02/23/13 189 lb (85.73 kg)  02/16/13 191 lb 11.2 oz (86.955 kg)     Lungs - Normal respiratory effort, chest expands symmetrically. Lungs are clear to auscultation, no crackles or wheezes.  Heart has regular rhythm and rate  Abdomen is soft and non tender with normal bowel sounds  Assessment:  Patient tolerating treatments well  Plan: Continue treatment per original radiation prescription

## 2013-03-03 ENCOUNTER — Ambulatory Visit
Admission: RE | Admit: 2013-03-03 | Discharge: 2013-03-03 | Disposition: A | Discharge status: Home / Self Care | Payer: Medicare Other | Source: Ambulatory Visit | Attending: Radiation Oncology | Admitting: Radiation Oncology

## 2013-03-04 ENCOUNTER — Ambulatory Visit
Admission: RE | Admit: 2013-03-04 | Discharge: 2013-03-04 | Disposition: A | Discharge status: Home / Self Care | Payer: Medicare Other | Source: Ambulatory Visit | Attending: Radiation Oncology | Admitting: Radiation Oncology

## 2013-03-05 ENCOUNTER — Ambulatory Visit
Admission: RE | Admit: 2013-03-05 | Discharge: 2013-03-05 | Disposition: A | Discharge status: Home / Self Care | Payer: Medicare Other | Source: Ambulatory Visit | Attending: Radiation Oncology | Admitting: Radiation Oncology

## 2013-03-08 ENCOUNTER — Encounter: Payer: Self-pay | Admitting: Internal Medicine

## 2013-03-08 ENCOUNTER — Other Ambulatory Visit (HOSPITAL_BASED_OUTPATIENT_CLINIC_OR_DEPARTMENT_OTHER): Payer: Medicare Other

## 2013-03-08 ENCOUNTER — Ambulatory Visit
Admission: RE | Admit: 2013-03-08 | Discharge: 2013-03-08 | Disposition: A | Discharge status: Home / Self Care | Payer: Medicare Other | Source: Ambulatory Visit | Attending: Radiation Oncology | Admitting: Radiation Oncology

## 2013-03-08 ENCOUNTER — Ambulatory Visit (HOSPITAL_BASED_OUTPATIENT_CLINIC_OR_DEPARTMENT_OTHER): Payer: Medicare Other

## 2013-03-08 ENCOUNTER — Ambulatory Visit (HOSPITAL_BASED_OUTPATIENT_CLINIC_OR_DEPARTMENT_OTHER): Payer: Medicare Other | Admitting: Physician Assistant

## 2013-03-08 ENCOUNTER — Encounter: Payer: Self-pay | Admitting: Physician Assistant

## 2013-03-08 DIAGNOSIS — C539 Malignant neoplasm of cervix uteri, unspecified: Secondary | ICD-10-CM

## 2013-03-08 DIAGNOSIS — C3410 Malignant neoplasm of upper lobe, unspecified bronchus or lung: Secondary | ICD-10-CM

## 2013-03-08 DIAGNOSIS — E039 Hypothyroidism, unspecified: Secondary | ICD-10-CM

## 2013-03-08 DIAGNOSIS — C3491 Malignant neoplasm of unspecified part of right bronchus or lung: Secondary | ICD-10-CM

## 2013-03-08 DIAGNOSIS — J449 Chronic obstructive pulmonary disease, unspecified: Secondary | ICD-10-CM

## 2013-03-08 DIAGNOSIS — C771 Secondary and unspecified malignant neoplasm of intrathoracic lymph nodes: Secondary | ICD-10-CM

## 2013-03-08 DIAGNOSIS — R197 Diarrhea, unspecified: Secondary | ICD-10-CM

## 2013-03-08 DIAGNOSIS — Z5111 Encounter for antineoplastic chemotherapy: Secondary | ICD-10-CM

## 2013-03-08 DIAGNOSIS — E876 Hypokalemia: Secondary | ICD-10-CM

## 2013-03-08 LAB — COMPREHENSIVE METABOLIC PANEL (CC13)
ALK PHOS: 77 U/L (ref 40–150)
ALT: 35 U/L (ref 0–55)
ANION GAP: 13 meq/L — AB (ref 3–11)
AST: 22 U/L (ref 5–34)
Albumin: 3.3 g/dL — ABNORMAL LOW (ref 3.5–5.0)
BUN: 9.2 mg/dL (ref 7.0–26.0)
CHLORIDE: 104 meq/L (ref 98–109)
CO2: 27 meq/L (ref 22–29)
Calcium: 8.5 mg/dL (ref 8.4–10.4)
Creatinine: 0.8 mg/dL (ref 0.6–1.1)
Glucose: 86 mg/dl (ref 70–140)
Potassium: 2.7 mEq/L — CL (ref 3.5–5.1)
Sodium: 144 mEq/L (ref 136–145)
Total Bilirubin: 0.86 mg/dL (ref 0.20–1.20)
Total Protein: 6.7 g/dL (ref 6.4–8.3)

## 2013-03-08 LAB — CBC WITH DIFFERENTIAL/PLATELET
BASO%: 0.8 % (ref 0.0–2.0)
BASOS ABS: 0 10*3/uL (ref 0.0–0.1)
EOS%: 1.1 % (ref 0.0–7.0)
Eosinophils Absolute: 0 10*3/uL (ref 0.0–0.5)
HEMATOCRIT: 34.7 % — AB (ref 34.8–46.6)
HEMOGLOBIN: 11.8 g/dL (ref 11.6–15.9)
LYMPH#: 0.5 10*3/uL — AB (ref 0.9–3.3)
LYMPH%: 18.9 % (ref 14.0–49.7)
MCH: 29.2 pg (ref 25.1–34.0)
MCHC: 34 g/dL (ref 31.5–36.0)
MCV: 85.9 fL (ref 79.5–101.0)
MONO#: 0.2 10*3/uL (ref 0.1–0.9)
MONO%: 8.7 % (ref 0.0–14.0)
NEUT#: 1.9 10*3/uL (ref 1.5–6.5)
NEUT%: 70.5 % (ref 38.4–76.8)
Platelets: 191 10*3/uL (ref 145–400)
RBC: 4.04 10*6/uL (ref 3.70–5.45)
RDW: 16 % — ABNORMAL HIGH (ref 11.2–14.5)
WBC: 2.6 10*3/uL — ABNORMAL LOW (ref 3.9–10.3)

## 2013-03-08 MED ORDER — FAMOTIDINE IN NACL 20-0.9 MG/50ML-% IV SOLN
20.0000 mg | Freq: Once | INTRAVENOUS | Status: AC
  Administered 2013-03-08: 20 mg via INTRAVENOUS

## 2013-03-08 MED ORDER — HEPARIN SOD (PORK) LOCK FLUSH 100 UNIT/ML IV SOLN
500.0000 [IU] | Freq: Once | INTRAVENOUS | Status: AC | PRN
  Administered 2013-03-08: 500 [IU]
  Filled 2013-03-08: qty 5

## 2013-03-08 MED ORDER — DIPHENHYDRAMINE HCL 50 MG/ML IJ SOLN
25.0000 mg | Freq: Once | INTRAMUSCULAR | Status: AC
  Administered 2013-03-08: 25 mg via INTRAVENOUS

## 2013-03-08 MED ORDER — SODIUM CHLORIDE 0.9 % IV SOLN
200.0000 mg | Freq: Once | INTRAVENOUS | Status: AC
  Administered 2013-03-08: 200 mg via INTRAVENOUS
  Filled 2013-03-08: qty 20

## 2013-03-08 MED ORDER — SODIUM CHLORIDE 0.9 % IV SOLN
Freq: Once | INTRAVENOUS | Status: AC
  Administered 2013-03-08: 12:00:00 via INTRAVENOUS

## 2013-03-08 MED ORDER — FAMOTIDINE IN NACL 20-0.9 MG/50ML-% IV SOLN
INTRAVENOUS | Status: AC
  Filled 2013-03-08: qty 50

## 2013-03-08 MED ORDER — DEXAMETHASONE SODIUM PHOSPHATE 20 MG/5ML IJ SOLN
INTRAMUSCULAR | Status: AC
  Filled 2013-03-08: qty 5

## 2013-03-08 MED ORDER — DEXAMETHASONE SODIUM PHOSPHATE 20 MG/5ML IJ SOLN
20.0000 mg | Freq: Once | INTRAMUSCULAR | Status: AC
  Administered 2013-03-08: 20 mg via INTRAVENOUS

## 2013-03-08 MED ORDER — ONDANSETRON 16 MG/50ML IVPB (CHCC)
INTRAVENOUS | Status: AC
  Filled 2013-03-08: qty 16

## 2013-03-08 MED ORDER — DIPHENHYDRAMINE HCL 50 MG/ML IJ SOLN
INTRAMUSCULAR | Status: AC
  Filled 2013-03-08: qty 1

## 2013-03-08 MED ORDER — SODIUM CHLORIDE 0.9 % IV SOLN
45.0000 mg/m2 | Freq: Once | INTRAVENOUS | Status: AC
  Administered 2013-03-08: 90 mg via INTRAVENOUS
  Filled 2013-03-08: qty 15

## 2013-03-08 MED ORDER — POTASSIUM CHLORIDE CRYS ER 20 MEQ PO TBCR
40.0000 meq | EXTENDED_RELEASE_TABLET | Freq: Once | ORAL | Status: AC
  Administered 2013-03-08: 40 meq via ORAL
  Filled 2013-03-08: qty 2

## 2013-03-08 MED ORDER — ONDANSETRON 16 MG/50ML IVPB (CHCC)
16.0000 mg | Freq: Once | INTRAVENOUS | Status: AC
  Administered 2013-03-08: 16 mg via INTRAVENOUS

## 2013-03-08 MED ORDER — POTASSIUM CHLORIDE CRYS ER 20 MEQ PO TBCR: 20.0000 meq | EXTENDED_RELEASE_TABLET | Freq: Two times a day (BID) | ORAL | Status: DC

## 2013-03-08 MED ORDER — SODIUM CHLORIDE 0.9 % IJ SOLN
10.0000 mL | INTRAMUSCULAR | Status: DC | PRN
  Administered 2013-03-08: 10 mL
  Filled 2013-03-08: qty 10

## 2013-03-08 NOTE — Patient Instructions (Signed)
Continue her course of concurrent chemoradiation as scheduled Followup in 2 weeks

## 2013-03-08 NOTE — Progress Notes (Addendum)
[facility] [phone]   [phone]  SHARED VISIT PROGRESS NOTE   Dawn Crutch, MD [place] [address]  DIAGNOSIS: Stage IIIA (T1b., N2, M0) non-small cell lung cancer, squamous cell carcinoma presented with right upper lobe lung nodule in addition to mediastinal lymphadenopathy diagnosed in November of 2014.  PRIOR THERAPY: None  CURRENT THERAPY: Concurrent chemoradiation with weekly carboplatin for AUC of 2 and paclitaxel 45 mg/M2, status post 6 cycle.   CHEMOTHERAPY INTENT: Control/curative  CURRENT # OF CHEMOTHERAPY CYCLES: 7  CURRENT ANTIEMETICS: Zofran, dexamethasone and Compazine  CURRENT SMOKING STATUS: Former smoker.  ORAL CHEMOTHERAPY AND CONSENT: None  CURRENT BISPHOSPHONATES USE: None  PAIN MANAGEMENT: 0/10  NARCOTICS INDUCED CONSTIPATION: None  LIVING WILL AND CODE STATUS: Full code   INTERVAL HISTORY: Dawn Mercer 68 y.o. female returns to the clinic today for followup visit accompanied by her husband. The patient is feeling fine today with no specific complaints except for some diarrhea that was aggravated by Ensure. This is been well-controlled with Imodium. She also has some symptoms of bronchitis. This is manifested by a deep cough with production of white secretions not associated with any fever or chills.. She is tolerating her course of concurrent chemoradiation fairly well with no significant adverse effects. She is scheduled to complete radiation therapy on 03/23/2013. She has no nausea or vomiting. She denied having any significant chest pain but continues to have shortness of breath with exertion. She denied hemoptysis. She has no significant weight loss or night sweats.   MEDICAL HISTORY: Past Medical History  Diagnosis Date  . 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 Status post radiation therapy 10/1999   . GERD (gastroesophageal reflux disease)   . Hypothyroidism   . IBS (irritable bowel syndrome)     Hx: of  . COPD (chronic obstructive pulmonary disease)   . Shortness of breath     Hx: of with exertion  . Bronchitis     Hx: of  . Hay fever     Hx: of  . Seizures     Hx: of over 40 years ago  . H/O hiatal hernia     ALLERGIES:  is allergic to latex and codeine.  MEDICATIONS:  Current Outpatient Prescriptions  Medication Sig Dispense Refill  . albuterol (PROVENTIL HFA;VENTOLIN HFA) 108 (90 BASE) MCG/ACT inhaler Inhale 1 puff into the lungs every 6 (six) hours as needed for wheezing.      Marland Kitchen ALPRAZolam (XANAX) 0.5 MG tablet Take 0.25 mg by mouth 2 (two) times daily as needed for anxiety.      . Cholecalciferol (VITAMIN D-3) 5000 UNITS TABS Take 5,000 Units by mouth 2 (two) times a week.      . diphenoxylate-atropine (LOMOTIL) 2.5-0.025 MG per tablet Take 1 tablet by mouth every Sunday.      . esomeprazole (NEXIUM) 40 MG capsule Take 40 mg by mouth daily before breakfast.      . levothyroxine (SYNTHROID, LEVOTHROID) 112 MCG tablet Take 112 mcg by mouth daily before breakfast.      . lidocaine-prilocaine (EMLA) cream Apply 1 application topically as needed. Apply to port 1 hour before chemo appt      . sucralfate (CARAFATE) 1 GM/10ML suspension Take 10 mLs (1 g total) by mouth 4 (four) times daily -  with meals and at bedtime.  420 mL  1  .  temazepam (RESTORIL) 15 MG capsule Take 1 capsule (15 mg total) by mouth at bedtime as needed for sleep.  30 capsule  0  . traMADol (ULTRAM) 50 MG tablet Take 1 tablet (50 mg total) by mouth every 6 (six) hours as needed for moderate pain or severe pain.  50 tablet  0  . Wound Cleansers (RADIAPLEX EX) Apply 1 application topically 2 (two) times daily.      . clidinium-chlordiazePOXIDE (LIBRAX) 5-2.5 MG per capsule Take 1 capsule by mouth daily as needed (for upset stomach).      . LORazepam (ATIVAN) 1 MG tablet One tab po 30 min before MRI  1 tablet   0  . potassium chloride SA (K-DUR,KLOR-CON) 20 MEQ tablet Take 1 tablet (20 mEq total) by mouth 2 (two) times daily.  20 tablet  0  . prochlorperazine (COMPAZINE) 10 MG tablet Take 10 mg by mouth every 6 (six) hours as needed for nausea or vomiting.       No current facility-administered medications for this visit.    SURGICAL HISTORY:  Past Surgical History  Procedure Laterality Date  . Laser ablation  04/2004    for VIN III  . Colonoscopy w/ biopsies and polypectomy      Hx: of  . Tubal ligation    . Video bronchoscopy with endobronchial navigation N/A 12/31/2012    Procedure: VIDEO BRONCHOSCOPY WITH ENDOBRONCHIAL NAVIGATION;  Surgeon: Melrose Nakayama, MD;  Location: [facility];  Service: Thoracic;  Laterality: N/A;  . Video bronchoscopy with endobronchial ultrasound N/A 12/31/2012    Procedure: VIDEO BRONCHOSCOPY WITH ENDOBRONCHIAL ULTRASOUND;  Surgeon: Melrose Nakayama, MD;  Location: [facility];  Service: Thoracic;  Laterality: N/A;    REVIEW OF SYSTEMS:  A comprehensive review of systems was negative except for: Respiratory: positive for cough and dyspnea on exertion Gastrointestinal: positive for diarrhea   PHYSICAL EXAMINATION: General appearance: alert, cooperative and no distress Head: Normocephalic, without obvious abnormality, atraumatic Neck: no adenopathy, no JVD, supple, symmetrical, trachea midline and thyroid not enlarged, symmetric, no tenderness/mass/nodules Lymph nodes: Cervical, supraclavicular, and axillary nodes normal. Resp: clear to auscultation bilaterally Back: symmetric, no curvature. ROM normal. No CVA tenderness. Cardio: regular rate and rhythm, S1, S2 normal, no murmur, click, rub or gallop GI: soft, non-tender; bowel sounds normal; no masses,  no organomegaly Extremities: extremities normal, atraumatic, no cyanosis or edema  ECOG PERFORMANCE STATUS: 1 - Symptomatic but completely ambulatory  Blood pressure 118/56, pulse 105, temperature 97.9 F (36.6  C), temperature source Oral, resp. rate 18, height 5\' 6"  (1.676 m), weight 185 lb (83.915 kg).  LABORATORY DATA: Lab Results  Component Value Date   WBC 2.6* 03/08/2013   HGB 11.8 03/08/2013   HCT 34.7* 03/08/2013   MCV 85.9 03/08/2013   PLT 191 03/08/2013      Chemistry      Component Value Date/Time   NA 144 03/08/2013 1022   NA 138 12/30/2012 1335   K 2.7* 03/08/2013 1022   K 4.1 12/30/2012 1335   CL 101 12/30/2012 1335   CO2 27 03/08/2013 1022   CO2 28 12/30/2012 1335   BUN 9.2 03/08/2013 1022   BUN 8 12/30/2012 1335   CREATININE 0.8 03/08/2013 1022   CREATININE 0.86 12/30/2012 1335      Component Value Date/Time   CALCIUM 8.5 03/08/2013 1022   CALCIUM 9.2 12/30/2012 1335   ALKPHOS 77 03/08/2013 1022   ALKPHOS 89 12/30/2012 1335   AST 22 03/08/2013 1022   AST  20 12/30/2012 1335   ALT 35 03/08/2013 1022   ALT 17 12/30/2012 1335   BILITOT 0.86 03/08/2013 1022   BILITOT 0.7 12/30/2012 1335       RADIOGRAPHIC STUDIES: Mr Jeri Cos NA Contrast  01/26/2013   CLINICAL DATA:  Non-small-cell lung cancer. Rule out metastatic disease.  EXAM: MRI HEAD WITHOUT AND WITH CONTRAST  TECHNIQUE: Multiplanar, multiecho pulse sequences of the brain and surrounding structures were obtained without and with intravenous contrast.  CONTRAST:  20mL MULTIHANCE GADOBENATE DIMEGLUMINE 529 MG/ML IV SOLN  COMPARISON:  None.  FINDINGS: Ventricle size is normal. Craniocervical junction is normal. No skull lesions are identified.  Small nonenhancing white matter hyperintensities, most consistent with chronic microvascular ischemia. Mild chronic changes in the pons.  Negative for acute infarct.  Negative for hemorrhage or mass.  Postcontrast imaging reveals normal enhancement. No metastatic deposits are identified  IMPRESSION: Negative for metastatic disease.  Mild chronic microvascular ischemic change.   Electronically Signed   By: Franchot Gallo M.D.   On: 2013/01/26 15:42   Ir Fluoro Guide Cv Line Right  01-26-13    CLINICAL DATA:  Lung carcinoma, needs venous access for chemotherapy  EXAM: TUNNELED PORT CATHETER PLACEMENT WITH ULTRASOUND AND FLUOROSCOPIC GUIDANCE  TECHNIQUE: The procedure, risks, benefits, and alternatives were explained to the patient. Questions regarding the procedure were encouraged and answered. The patient understands and consents to the procedure. As antibiotic prophylaxis, cefazolin 2 g was ordered pre-procedure and administered intravenously within one hour of incision. Patency of the right IJ vein was confirmed with ultrasound with image documentation. An appropriate skin site was determined. Skin site was marked. Region was prepped using maximum barrier technique including cap and mask, sterile gown, sterile gloves, large sterile sheet, and Chlorhexidine as cutaneous antisepsis. The region was infiltrated locally with 1% lidocaine. Under real-time ultrasound guidance, the right IJ vein was accessed with a 21 gauge micropuncture needle; the needle tip within the vein was confirmed with ultrasound image documentation. Needle was exchanged over a 018 guidewire for transitional dilator which allowed passage of the [facility] wire into the IVC. Over this, the transitional dilator was exchanged for a 5 [place] MPA catheter. A small incision was made on the right anterior chest wall and a subcutaneous pocket fashioned. The power-injectable port was positioned and its catheter tunneled to the right IJ dermatotomy site. The MPA catheter was exchanged over an Amplatz wire for a peel-away sheath, through which the port catheter, which had been trimmed to the appropriate length, was advanced and positioned under fluoroscopy with its tip at the cavoatrial junction. Spot chest radiograph confirms good catheter position and no pneumothorax. The pocket was closed with deep interrupted and subcuticular continuous 3-0 Monocryl sutures. The port was flushed per protocol. The incisions were covered with Dermabond then  covered with a sterile dressing. No immediate complication.  ANESTHESIA/SEDATION: Intravenous Fentanyl and Versed were administered as conscious sedation during continuous cardiorespiratory monitoring by the radiology RN, with a total moderate sedation time of twenty minutes.  FLUOROSCOPY TIME:  35573 seconds  IMPRESSION: Technically successful right IJ power-injectable port catheter placement. Ready for routine use.   Electronically Signed   By: Arne Cleveland M.D.   On: January 26, 2013 15:01   Ir US Guide Vasc Access Right  January 26, 2013   CLINICAL DATA:  Lung carcinoma, needs venous access for chemotherapy  EXAM: TUNNELED PORT CATHETER PLACEMENT WITH ULTRASOUND AND FLUOROSCOPIC GUIDANCE  TECHNIQUE: The procedure, risks, benefits, and alternatives were explained to the patient. Questions  regarding the procedure were encouraged and answered. The patient understands and consents to the procedure. As antibiotic prophylaxis, cefazolin 2 g was ordered pre-procedure and administered intravenously within one hour of incision. Patency of the right IJ vein was confirmed with ultrasound with image documentation. An appropriate skin site was determined. Skin site was marked. Region was prepped using maximum barrier technique including cap and mask, sterile gown, sterile gloves, large sterile sheet, and Chlorhexidine as cutaneous antisepsis. The region was infiltrated locally with 1% lidocaine. Under real-time ultrasound guidance, the right IJ vein was accessed with a 21 gauge micropuncture needle; the needle tip within the vein was confirmed with ultrasound image documentation. Needle was exchanged over a 018 guidewire for transitional dilator which allowed passage of the [employer] wire into the IVC. Over this, the transitional dilator was exchanged for a 5 [place] MPA catheter. A small incision was made on the right anterior chest wall and a subcutaneous pocket fashioned. The power-injectable port was positioned and its catheter  tunneled to the right IJ dermatotomy site. The MPA catheter was exchanged over an Amplatz wire for a peel-away sheath, through which the port catheter, which had been trimmed to the appropriate length, was advanced and positioned under fluoroscopy with its tip at the cavoatrial junction. Spot chest radiograph confirms good catheter position and no pneumothorax. The pocket was closed with deep interrupted and subcuticular continuous 3-0 Monocryl sutures. The port was flushed per protocol. The incisions were covered with Dermabond then covered with a sterile dressing. No immediate complication.  ANESTHESIA/SEDATION: Intravenous Fentanyl and Versed were administered as conscious sedation during continuous cardiorespiratory monitoring by the radiology RN, with a total moderate sedation time of twenty minutes.  FLUOROSCOPY TIME:  12458 seconds  IMPRESSION: Technically successful right IJ power-injectable port catheter placement. Ready for routine use.   Electronically Signed   By: Arne Cleveland M.D.   On: 01/20/2013 15:01   Ct Biopsy  01/11/2013   CLINICAL DATA:  1.7 cm hypermetabolic right upper lobe lung nodule. Previous bronchoscopy demonstrated a single cluster of atypical cells which was not adequate for definitive diagnosis. The patient has been referred for percutaneous biopsy.  EXAM: CT GUIDED CORE BIOPSY OF RIGHT LUNG  ANESTHESIA/SEDATION: 2.0  Mg IV Versed; 150 mcg IV Fentanyl  Total Moderate Sedation Time: 32 minutes.  PROCEDURE: The procedure risks, benefits, and alternatives were explained to the patient. Questions regarding the procedure were encouraged and answered. The patient understands and consents to the procedure.  The right anterior chest wall was prepped with Betadinein a sterile fashion, and a sterile drape was applied covering the operative field. A sterile gown and sterile gloves were used for the procedure. Local anesthesia was provided with 1% Lidocaine.  Localizing CT was performed in a  supine position. Under CT guidance, a 17 gauge trocar needle was advanced to the margin of the right upper lobe lung nodule. After confirming needle tip position, a total of 2 separate 18 gauge core biopsy samples were obtained. Post biopsy CT was performed after the outer needle removal.  Complications: Tiny amount of pleural air.  FINDINGS: Imaging demonstrates the anterior right upper lobe nodule measuring roughly 1.7 cm in greatest diameter. Antral lateral approach was performed. Core biopsy yielded solid tissue. During the procedure, a small amount of adjacent pleural air became evident. This will be followed by a chest x-ray 2 hr after the procedure.  IMPRESSION: CT-guided core biopsy performed of a 1.7 cm right upper lobe lung nodule. Solid tissue was  obtained. Tiny pleural air/pneumothorax was present on biopsy completion which will be followed by chest x-ray.   Electronically Signed   By: Aletta Edouard M.D.   On: 01/11/2013 13:23   Dg Chest Port 1 View  01/11/2013   CLINICAL DATA:  Status post lung biopsy.  EXAM: PORTABLE CHEST - 1 VIEW  COMPARISON:  12/30/2012.  FINDINGS: No postprocedural pneumothorax is identified. The heart and lungs are stable.  IMPRESSION: No postprocedural pneumothorax.   Electronically Signed   By: Kalman Jewels M.D.   On: 01/11/2013 15:30    ASSESSMENT AND PLAN: This is a very pleasant 68 years old white female recently diagnosed with a stage IIIa non-small cell lung cancer currently undergoing concurrent chemoradiation with weekly carboplatin and paclitaxel status post 3 weekly treatments. She is tolerating her treatment fairly well with no significant adverse effects. Patient was discussed with and also seen by Dr. Julien Nordmann. She will continue with her course of concurrent chemoradiation as scheduled. As she will not complete her radiation therapy until 03/23/2013 we will add an additional week of chemotherapy on 03/15/2013. She'll return in 2 weeks for another symptom  management visit. Of note the patient's chemistries became available revealing hypokalemia with a value of 2.7. She was given 40 mEq of potassium by mouth while in the infusion area. Prescription for K. Duerr 40 mEq by mouth daily for the next 10 days was sent to her pharmacy of record via E. scribed. Of note the patient stated that her hair is falling out she requested a week for culture to be used at the [facility] with swallowing. This was provided to her.  She was advised to call immediately if she has any concerning symptoms in the interval.  The patient voices understanding of current disease status and treatment options and is in agreement with the current care plan.  All questions were answered. The patient knows to call the clinic with any problems, questions or concerns. We can certainly see the patient much sooner if necessary.  Carlton Adam, PA-C  Addendum:  Hematology/Oncology Attending: I had a face-to-face encounter with the patient. I recommended her care plan. This is a very pleasant 68 years old white female with stage IIIa non-small cell lung cancer currently undergoing concurrent chemoradiation with weekly carboplatin and paclitaxel is status post 6 cycles. The patient is tolerating her treatment fairly well with no significant adverse effects except for diarrhea from her irritable bowel disease and she is currently followed by gastroenterology. I recommended for her to continue her current treatment with concurrent chemoradiation as scheduled. Her last radiation therapy is on 03/23/2013. I will see the patient back for followup visit in 2 weeks for evaluation and ordering restaging scan. For the hypokalemia she would be started on K DUR 40 mEq by mouth daily for 10 days. She was advised to call immediately she has any concerning symptoms in the interval. Eilleen Kempf., MD 03/09/2013

## 2013-03-08 NOTE — Patient Instructions (Signed)
[facility] Discharge Instructions for Patients Receiving Chemotherapy  Today you received the following chemotherapy agents Taxol/Carboplatin To help prevent nausea and vomiting after your treatment, we encourage you to take your nausea medication as prescribed.  If you develop nausea and vomiting that is not controlled by your nausea medication, call the clinic.   BELOW ARE SYMPTOMS THAT SHOULD BE REPORTED IMMEDIATELY:  *FEVER GREATER THAN 100.5 F  *CHILLS WITH OR WITHOUT FEVER  NAUSEA AND VOMITING THAT IS NOT CONTROLLED WITH YOUR NAUSEA MEDICATION  *UNUSUAL SHORTNESS OF BREATH  *UNUSUAL BRUISING OR BLEEDING  TENDERNESS IN MOUTH AND THROAT WITH OR WITHOUT PRESENCE OF ULCERS  *URINARY PROBLEMS  *BOWEL PROBLEMS  UNUSUAL RASH Items with * indicate a potential emergency and should be followed up as soon as possible.  Feel free to call the clinic you have any questions or concerns. The clinic phone number is [phone].    

## 2013-03-09 ENCOUNTER — Ambulatory Visit
Admission: RE | Admit: 2013-03-09 | Discharge: 2013-03-09 | Disposition: A | Discharge status: Home / Self Care | Payer: Medicare Other | Source: Ambulatory Visit | Attending: Radiation Oncology | Admitting: Radiation Oncology

## 2013-03-09 VITALS — BP 108/54 | HR 88 | Temp 98.5°F | Wt 188.0 lb

## 2013-03-09 DIAGNOSIS — C3410 Malignant neoplasm of upper lobe, unspecified bronchus or lung: Secondary | ICD-10-CM

## 2013-03-09 NOTE — Progress Notes (Signed)
Patient for weekly assessment of radiation to right chest (lung cancer).Completed 25 of 35 treatments.denies pain but took pain pill early this morning.Mid posterior chest wall discoloration with rash secondary to radiation.Continue application of radiaplex.Started on oral potassium yesterday  For low K+ of 2.7.Appetite is good, weight up 3 lbs.

## 2013-03-09 NOTE — Progress Notes (Signed)
[place]     Rexene Edison, M.D. [place], [place] [address]               Blair Promise, M.D., Ph.D. Phone: [phone]      [place] A. Tammi Klippel, M.D. Fax: [phone]      Jodelle Gross, M.D., Ph.D.         Thea Silversmith, M.D.         Wyvonnia Lora, M.D Weekly Treatment Management Note  Name: Dawn Mercer     MRN: [id number]        CSN: [id number] Date: 03/09/2013      DOB: Jun 05, 1945  CC:  Melinda Crutch, MD         [facility] Challenger    Status: Outpatient  Diagnosis: The encounter diagnosis was Malignant neoplasm of upper lobe, bronchus or lung.  Current Dose: 45 Gy  Current Fraction: 25  Planned Dose: 63 Gy  Narrative: [name] was seen today for weekly treatment management. The chart was checked and CBCT  were reviewed. She continues to tolerate her treatments well at this time. She is using Carafate which is helpful for her esophageal symptoms. She has actually gained some weight. She denies any breathing problems. On cone beam imaging her right upper lung nodule is decreasing in size.  She denies any breathing problems.  Latex and Codeine  Current Outpatient Prescriptions  Medication Sig Dispense Refill  . albuterol (PROVENTIL HFA;VENTOLIN HFA) 108 (90 BASE) MCG/ACT inhaler Inhale 1 puff into the lungs every 6 (six) hours as needed for wheezing.      Marland Kitchen ALPRAZolam (XANAX) 0.5 MG tablet Take 0.25 mg by mouth 2 (two) times daily as needed for anxiety.      . Cholecalciferol (VITAMIN D-3) 5000 UNITS TABS Take 5,000 Units by mouth 2 (two) times a week.      . clidinium-chlordiazePOXIDE (LIBRAX) 5-2.5 MG per capsule Take 1 capsule by mouth daily as needed (for upset stomach).      . diphenoxylate-atropine (LOMOTIL) 2.5-0.025 MG per tablet Take 1 tablet by mouth every Sunday.      . esomeprazole (NEXIUM) 40 MG capsule Take 40 mg by mouth daily before breakfast.      . levothyroxine (SYNTHROID, LEVOTHROID) 112 MCG  tablet Take 112 mcg by mouth daily before breakfast.      . lidocaine-prilocaine (EMLA) cream Apply 1 application topically as needed. Apply to port 1 hour before chemo appt      . LORazepam (ATIVAN) 1 MG tablet One tab po 30 min before MRI  1 tablet  0  . potassium chloride SA (K-DUR,KLOR-CON) 20 MEQ tablet Take 1 tablet (20 mEq total) by mouth 2 (two) times daily.  20 tablet  0  . prochlorperazine (COMPAZINE) 10 MG tablet Take 10 mg by mouth every 6 (six) hours as needed for nausea or vomiting.      . sucralfate (CARAFATE) 1 GM/10ML suspension Take 10 mLs (1 g total) by mouth 4 (four) times daily -  with meals and at bedtime.  420 mL  1  . temazepam (RESTORIL) 15 MG capsule Take 1 capsule (15 mg total) by mouth at bedtime as needed for sleep.  30 capsule  0  . traMADol (ULTRAM) 50 MG tablet Take 1 tablet (50 mg total) by mouth every 6 (six) hours as needed for moderate pain or severe pain.  50 tablet  0  . Wound Cleansers (RADIAPLEX  EX) Apply 1 application topically 2 (two) times daily.       No current facility-administered medications for this encounter.   Labs:  Lab Results  Component Value Date   WBC 2.6* 03/08/2013   HGB 11.8 03/08/2013   HCT 34.7* 03/08/2013   MCV 85.9 03/08/2013   PLT 191 03/08/2013   Lab Results  Component Value Date   CREATININE 0.8 03/08/2013   BUN 9.2 03/08/2013   NA 144 03/08/2013   K 2.7* 03/08/2013   CL 101 12/30/2012   CO2 27 03/08/2013   Lab Results  Component Value Date   ALT 35 03/08/2013   AST 22 03/08/2013   BILITOT 0.86 03/08/2013    Physical Examination:  Filed Vitals:   03/09/13 1201  BP: 108/54  Pulse: 88  Temp: 98.5 F (36.9 C)    Wt Readings from Last 3 Encounters:  03/09/13 188 lb (85.276 kg)  03/08/13 185 lb (83.915 kg)  03/02/13 183 lb 9.6 oz (83.28 kg)     Lungs - Normal respiratory effort, chest expands symmetrically. Lungs are clear to auscultation, no crackles or wheezes.  Heart has regular rhythm and rate  Abdomen is soft  and non tender with normal bowel sounds  Assessment:  Patient tolerating treatments well  Plan: Continue treatment per original radiation prescription

## 2013-03-10 ENCOUNTER — Ambulatory Visit
Admission: RE | Admit: 2013-03-10 | Discharge: 2013-03-10 | Disposition: A | Discharge status: Home / Self Care | Payer: Medicare Other | Source: Ambulatory Visit | Attending: Radiation Oncology | Admitting: Radiation Oncology

## 2013-03-11 ENCOUNTER — Ambulatory Visit
Admission: RE | Admit: 2013-03-11 | Discharge: 2013-03-11 | Disposition: A | Discharge status: Home / Self Care | Payer: Medicare Other | Source: Ambulatory Visit | Attending: Radiation Oncology | Admitting: Radiation Oncology

## 2013-03-11 ENCOUNTER — Telehealth: Payer: Self-pay | Admitting: *Deleted

## 2013-03-11 NOTE — Telephone Encounter (Signed)
sw pt gv appts for 03/15/13 w/ labs@ 11am and tx@ 12noon. i also gv appts for 03/22/13 w/ labs@ 11:15am, ov@ 12:15pm, and tx@ 1:15pm. Pt is aware...td

## 2013-03-12 ENCOUNTER — Other Ambulatory Visit: Payer: Self-pay | Admitting: Medical Oncology

## 2013-03-12 ENCOUNTER — Ambulatory Visit
Admission: RE | Admit: 2013-03-12 | Discharge: 2013-03-12 | Disposition: A | Discharge status: Home / Self Care | Payer: Medicare Other | Source: Ambulatory Visit | Attending: Radiation Oncology | Admitting: Radiation Oncology

## 2013-03-12 ENCOUNTER — Other Ambulatory Visit: Payer: Self-pay | Admitting: Physician Assistant

## 2013-03-12 ENCOUNTER — Ambulatory Visit (HOSPITAL_BASED_OUTPATIENT_CLINIC_OR_DEPARTMENT_OTHER): Payer: Medicare Other

## 2013-03-12 ENCOUNTER — Other Ambulatory Visit: Payer: Self-pay

## 2013-03-12 ENCOUNTER — Encounter: Payer: Self-pay | Admitting: Physician Assistant

## 2013-03-12 DIAGNOSIS — R202 Paresthesia of skin: Secondary | ICD-10-CM

## 2013-03-12 DIAGNOSIS — C3410 Malignant neoplasm of upper lobe, unspecified bronchus or lung: Secondary | ICD-10-CM

## 2013-03-12 DIAGNOSIS — R000 Tachycardia, unspecified: Secondary | ICD-10-CM

## 2013-03-12 LAB — COMPREHENSIVE METABOLIC PANEL (CC13)
ALBUMIN: 3.1 g/dL — AB (ref 3.5–5.0)
ALT: 44 U/L (ref 0–55)
AST: 33 U/L (ref 5–34)
Alkaline Phosphatase: 113 U/L (ref 40–150)
Anion Gap: 13 mEq/L — ABNORMAL HIGH (ref 3–11)
BUN: 8.4 mg/dL (ref 7.0–26.0)
CO2: 29 mEq/L (ref 22–29)
CREATININE: 0.9 mg/dL (ref 0.6–1.1)
Calcium: 8.1 mg/dL — ABNORMAL LOW (ref 8.4–10.4)
Chloride: 100 mEq/L (ref 98–109)
GLUCOSE: 118 mg/dL (ref 70–140)
Potassium: 3.3 mEq/L — ABNORMAL LOW (ref 3.5–5.1)
Sodium: 141 mEq/L (ref 136–145)
Total Bilirubin: 1.36 mg/dL — ABNORMAL HIGH (ref 0.20–1.20)
Total Protein: 6.6 g/dL (ref 6.4–8.3)

## 2013-03-12 LAB — CBC WITH DIFFERENTIAL/PLATELET
BASO%: 1.3 % (ref 0.0–2.0)
BASOS ABS: 0 10*3/uL (ref 0.0–0.1)
EOS ABS: 0 10*3/uL (ref 0.0–0.5)
EOS%: 0.7 % (ref 0.0–7.0)
HCT: 32.8 % — ABNORMAL LOW (ref 34.8–46.6)
HEMOGLOBIN: 11.4 g/dL — AB (ref 11.6–15.9)
LYMPH%: 28.9 % (ref 14.0–49.7)
MCH: 30.7 pg (ref 25.1–34.0)
MCHC: 34.9 g/dL (ref 31.5–36.0)
MCV: 88 fL (ref 79.5–101.0)
MONO#: 0.1 10*3/uL (ref 0.1–0.9)
MONO%: 8.4 % (ref 0.0–14.0)
NEUT%: 60.7 % (ref 38.4–76.8)
NEUTROS ABS: 1 10*3/uL — AB (ref 1.5–6.5)
PLATELETS: 194 10*3/uL (ref 145–400)
RBC: 3.72 10*6/uL (ref 3.70–5.45)
RDW: 16.9 % — ABNORMAL HIGH (ref 11.2–14.5)
WBC: 1.6 10*3/uL — ABNORMAL LOW (ref 3.9–10.3)
lymph#: 0.5 10*3/uL — ABNORMAL LOW (ref 0.9–3.3)

## 2013-03-12 NOTE — Progress Notes (Signed)
Patient presented for XRT complaining of tingling in her fingertips and across the bridge of her nose. She denied chest pain or shortness of breath. Head no unilateral or bilateral weakness. Denied any loss of bowel or bladder control. Stat chemistries were obtained and revealed a slight improvement in her potassium level of 3.3. Her total white count was 1.6 with an ANC of 1.0 however she was afebrile. An EKG was obtained and initially revealed prolonged QT however the EKG normalized with a ventricular rate of 90 beats per minute and read out as normal sinus rhythm. Of note the EKG with the prolonged QT was also read out with normal sinus rhythm. Lung exam is clear and patient was initially slightly tachycardic at 106 beats per minute. Patient was reviewed with Dr. Julien Nordmann. She is to continue on her oral potassium supplementation as prescribed. She'll proceed with her radiation therapy today as scheduled. Should she have any symptoms of concern over the weekend she is to contact the physician on call and or present to the emergency room for further evaluation and management. Both patient and her husband voiced understanding.  Wynetta Emery, Adenike Shidler E, PA-C

## 2013-03-15 ENCOUNTER — Ambulatory Visit (HOSPITAL_BASED_OUTPATIENT_CLINIC_OR_DEPARTMENT_OTHER): Payer: Medicare Other

## 2013-03-15 ENCOUNTER — Ambulatory Visit
Admission: RE | Admit: 2013-03-15 | Discharge: 2013-03-15 | Disposition: A | Discharge status: Home / Self Care | Payer: Medicare Other | Source: Ambulatory Visit | Attending: Radiation Oncology | Admitting: Radiation Oncology

## 2013-03-15 ENCOUNTER — Other Ambulatory Visit: Payer: Self-pay | Admitting: Internal Medicine

## 2013-03-15 ENCOUNTER — Other Ambulatory Visit (HOSPITAL_BASED_OUTPATIENT_CLINIC_OR_DEPARTMENT_OTHER): Payer: Medicare Other

## 2013-03-15 VITALS — BP 116/73 | HR 103 | Temp 98.1°F | Resp 18

## 2013-03-15 DIAGNOSIS — C3491 Malignant neoplasm of unspecified part of right bronchus or lung: Secondary | ICD-10-CM

## 2013-03-15 DIAGNOSIS — C771 Secondary and unspecified malignant neoplasm of intrathoracic lymph nodes: Secondary | ICD-10-CM

## 2013-03-15 DIAGNOSIS — Z5111 Encounter for antineoplastic chemotherapy: Secondary | ICD-10-CM

## 2013-03-15 DIAGNOSIS — C3410 Malignant neoplasm of upper lobe, unspecified bronchus or lung: Secondary | ICD-10-CM

## 2013-03-15 LAB — CBC WITH DIFFERENTIAL/PLATELET
BASO%: 0.9 % (ref 0.0–2.0)
Basophils Absolute: 0 10*3/uL (ref 0.0–0.1)
EOS ABS: 0 10*3/uL (ref 0.0–0.5)
EOS%: 0.6 % (ref 0.0–7.0)
HEMATOCRIT: 34 % — AB (ref 34.8–46.6)
HGB: 11.5 g/dL — ABNORMAL LOW (ref 11.6–15.9)
LYMPH%: 18.3 % (ref 14.0–49.7)
MCH: 29.6 pg (ref 25.1–34.0)
MCHC: 33.8 g/dL (ref 31.5–36.0)
MCV: 87.4 fL (ref 79.5–101.0)
MONO#: 0.7 10*3/uL (ref 0.1–0.9)
MONO%: 21.4 % — ABNORMAL HIGH (ref 0.0–14.0)
NEUT%: 58.8 % (ref 38.4–76.8)
NEUTROS ABS: 1.9 10*3/uL (ref 1.5–6.5)
PLATELETS: 192 10*3/uL (ref 145–400)
RBC: 3.89 10*6/uL (ref 3.70–5.45)
RDW: 16.5 % — ABNORMAL HIGH (ref 11.2–14.5)
WBC: 3.2 10*3/uL — ABNORMAL LOW (ref 3.9–10.3)
lymph#: 0.6 10*3/uL — ABNORMAL LOW (ref 0.9–3.3)
nRBC: 1 % — ABNORMAL HIGH (ref 0–0)

## 2013-03-15 LAB — COMPREHENSIVE METABOLIC PANEL (CC13)
ALBUMIN: 3.2 g/dL — AB (ref 3.5–5.0)
ALK PHOS: 92 U/L (ref 40–150)
ALT: 27 U/L (ref 0–55)
AST: 22 U/L (ref 5–34)
Anion Gap: 11 mEq/L (ref 3–11)
BUN: 6.2 mg/dL — AB (ref 7.0–26.0)
CHLORIDE: 102 meq/L (ref 98–109)
CO2: 25 mEq/L (ref 22–29)
Calcium: 8.3 mg/dL — ABNORMAL LOW (ref 8.4–10.4)
Creatinine: 0.8 mg/dL (ref 0.6–1.1)
Glucose: 79 mg/dl (ref 70–140)
POTASSIUM: 3.9 meq/L (ref 3.5–5.1)
SODIUM: 138 meq/L (ref 136–145)
Total Bilirubin: 0.93 mg/dL (ref 0.20–1.20)
Total Protein: 6.9 g/dL (ref 6.4–8.3)

## 2013-03-15 LAB — TECHNOLOGIST REVIEW

## 2013-03-15 MED ORDER — DEXAMETHASONE SODIUM PHOSPHATE 20 MG/5ML IJ SOLN
20.0000 mg | Freq: Once | INTRAMUSCULAR | Status: AC
  Administered 2013-03-15: 20 mg via INTRAVENOUS

## 2013-03-15 MED ORDER — FAMOTIDINE IN NACL 20-0.9 MG/50ML-% IV SOLN
INTRAVENOUS | Status: AC
  Filled 2013-03-15: qty 50

## 2013-03-15 MED ORDER — SODIUM CHLORIDE 0.9 % IV SOLN
Freq: Once | INTRAVENOUS | Status: AC
  Administered 2013-03-15: 12:00:00 via INTRAVENOUS

## 2013-03-15 MED ORDER — HEPARIN SOD (PORK) LOCK FLUSH 100 UNIT/ML IV SOLN
500.0000 [IU] | Freq: Once | INTRAVENOUS | Status: AC | PRN
  Administered 2013-03-15: 500 [IU]
  Filled 2013-03-15: qty 5

## 2013-03-15 MED ORDER — DIPHENHYDRAMINE HCL 50 MG/ML IJ SOLN
25.0000 mg | Freq: Once | INTRAMUSCULAR | Status: AC
  Administered 2013-03-15: 25 mg via INTRAVENOUS

## 2013-03-15 MED ORDER — ONDANSETRON 16 MG/50ML IVPB (CHCC)
INTRAVENOUS | Status: AC
  Filled 2013-03-15: qty 16

## 2013-03-15 MED ORDER — ONDANSETRON 16 MG/50ML IVPB (CHCC)
16.0000 mg | Freq: Once | INTRAVENOUS | Status: AC
  Administered 2013-03-15: 16 mg via INTRAVENOUS

## 2013-03-15 MED ORDER — DIPHENHYDRAMINE HCL 50 MG/ML IJ SOLN
INTRAMUSCULAR | Status: AC
  Filled 2013-03-15: qty 1

## 2013-03-15 MED ORDER — SODIUM CHLORIDE 0.9 % IV SOLN
201.2000 mg | Freq: Once | INTRAVENOUS | Status: AC
  Administered 2013-03-15: 200 mg via INTRAVENOUS
  Filled 2013-03-15: qty 20

## 2013-03-15 MED ORDER — FAMOTIDINE IN NACL 20-0.9 MG/50ML-% IV SOLN
20.0000 mg | Freq: Once | INTRAVENOUS | Status: AC
  Administered 2013-03-15: 20 mg via INTRAVENOUS

## 2013-03-15 MED ORDER — SODIUM CHLORIDE 0.9 % IJ SOLN
10.0000 mL | INTRAMUSCULAR | Status: DC | PRN
  Administered 2013-03-15: 10 mL
  Filled 2013-03-15: qty 10

## 2013-03-15 MED ORDER — DEXAMETHASONE SODIUM PHOSPHATE 20 MG/5ML IJ SOLN
INTRAMUSCULAR | Status: AC
  Filled 2013-03-15: qty 5

## 2013-03-15 MED ORDER — SODIUM CHLORIDE 0.9 % IV SOLN
45.0000 mg/m2 | Freq: Once | INTRAVENOUS | Status: AC
  Administered 2013-03-15: 90 mg via INTRAVENOUS
  Filled 2013-03-15: qty 15

## 2013-03-15 NOTE — Patient Instructions (Signed)
[facility] Discharge Instructions for Patients Receiving Chemotherapy  Today you received the following chemotherapy agents Taxol and Carboplatin.  To help prevent nausea and vomiting after your treatment, we encourage you to take your nausea medication.   If you develop nausea and vomiting that is not controlled by your nausea medication, call the clinic.   BELOW ARE SYMPTOMS THAT SHOULD BE REPORTED IMMEDIATELY:  *FEVER GREATER THAN 100.5 F  *CHILLS WITH OR WITHOUT FEVER  NAUSEA AND VOMITING THAT IS NOT CONTROLLED WITH YOUR NAUSEA MEDICATION  *UNUSUAL SHORTNESS OF BREATH  *UNUSUAL BRUISING OR BLEEDING  TENDERNESS IN MOUTH AND THROAT WITH OR WITHOUT PRESENCE OF ULCERS  *URINARY PROBLEMS  *BOWEL PROBLEMS  UNUSUAL RASH Items with * indicate a potential emergency and should be followed up as soon as possible.  Feel free to call the clinic you have any questions or concerns. The clinic phone number is [phone].    

## 2013-03-16 ENCOUNTER — Ambulatory Visit
Admission: RE | Admit: 2013-03-16 | Discharge: 2013-03-16 | Disposition: A | Discharge status: Home / Self Care | Payer: Medicare Other | Source: Ambulatory Visit | Attending: Radiation Oncology | Admitting: Radiation Oncology

## 2013-03-16 VITALS — BP 101/51 | HR 81 | Temp 97.8°F | Ht 66.0 in | Wt 185.9 lb

## 2013-03-16 DIAGNOSIS — C3410 Malignant neoplasm of upper lobe, unspecified bronchus or lung: Secondary | ICD-10-CM

## 2013-03-16 MED ORDER — BIAFINE EX EMUL
Freq: Two times a day (BID) | CUTANEOUS | Status: DC
  Administered 2013-03-16: 12:00:00 via TOPICAL

## 2013-03-16 MED ORDER — SUCRALFATE 1 GM/10ML PO SUSP: 1.0000 g | Freq: Three times a day (TID) | ORAL | Status: DC

## 2013-03-16 MED ORDER — SILVER SULFADIAZINE 1 % EX CREA
TOPICAL_CREAM | Freq: Every day | CUTANEOUS | Status: DC
  Administered 2013-03-16: 12:00:00 via TOPICAL

## 2013-03-16 NOTE — Progress Notes (Signed)
Dawn Mercer has had 30 fractions to her right chest.  She denies pain.  She reports that her shortness of breath and cough has improved.  She denies hemoptysis.  She has had a few nose bleeds.  She reports that she is having burning/indigestion in her midsternal region.  She requests a refill on her carafate.  She reports that she has been eating small meals and does not feel like eating very much.   She has lost 3 lbs since 1/13.  Orthostatic vitals done: sitting bp 101/51 and hr 81.  Standing: bp 101/61, hr 102.  She had her last round of chemotherapy yesterday.  She is fatigued.  The skin on her right upper back is red and peeling.  She would llike to try biafine.

## 2013-03-16 NOTE — Progress Notes (Signed)
[place]     Rexene Edison, M.D. [place], [place] [address]               Blair Promise, M.D., Ph.D. Phone: [phone]      Rodman Key A. Tammi Klippel, M.D. Fax: [phone]      Jodelle Gross, M.D., Ph.D.         Thea Silversmith, M.D.         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  Current Dose: 54 Gy  Current Fraction: 30  Planned Dose: 63 Gy  Narrative: [name] was seen today for weekly treatment management. The chart was checked and CBCT  were reviewed. She is having some esophageal symptoms. She continues take Nexium in her carafate. I did refill the patient's Carafate today. She does have some fatigue but denies any breathing difficulties. She does have some itching in the back region.  Latex and Codeine  Current Outpatient Prescriptions  Medication Sig Dispense Refill  . albuterol (PROVENTIL HFA;VENTOLIN HFA) 108 (90 BASE) MCG/ACT inhaler Inhale 1 puff into the lungs every 6 (six) hours as needed for wheezing.      Marland Kitchen ALPRAZolam (XANAX) 0.5 MG tablet Take 0.25 mg by mouth 2 (two) times daily as needed for anxiety.      . Cholecalciferol (VITAMIN D-3) 5000 UNITS TABS Take 5,000 Units by mouth 2 (two) times a week.      . clidinium-chlordiazePOXIDE (LIBRAX) 5-2.5 MG per capsule Take 1 capsule by mouth daily as needed (for upset stomach).      . esomeprazole (NEXIUM) 40 MG capsule Take 40 mg by mouth daily before breakfast.      . levothyroxine (SYNTHROID, LEVOTHROID) 112 MCG tablet Take 112 mcg by mouth daily before breakfast.      . lidocaine-prilocaine (EMLA) cream Apply 1 application topically as needed. Apply to port 1 hour before chemo appt       . potassium chloride SA (K-DUR,KLOR-CON) 20 MEQ tablet Take 1 tablet (20 mEq total) by mouth 2 (two) times daily.  20 tablet  0  . prochlorperazine (COMPAZINE) 10 MG tablet Take 10 mg by mouth every 6 (six) hours as needed for nausea or vomiting.      . sucralfate (CARAFATE) 1 GM/10ML suspension Take 10 mLs (1 g total) by mouth 4 (four) times daily -  with meals and at bedtime.  420 mL  1  . temazepam (RESTORIL) 15 MG capsule Take 1 capsule (15 mg total) by mouth at bedtime as needed for sleep.  30 capsule  0  . traMADol (ULTRAM) 50 MG tablet Take 1 tablet (50 mg total) by mouth every 6 (six) hours as needed for moderate pain or severe pain.  50 tablet  0  . Wound Cleansers (RADIAPLEX EX) Apply 1 application topically 2 (two) times daily.      . diphenoxylate-atropine (LOMOTIL) 2.5-0.025 MG per tablet Take 1 tablet by mouth every Sunday.      Marland Kitchen LORazepam (ATIVAN) 1 MG tablet One tab po 30 min before MRI  1 tablet  0   Current Facility-Administered Medications  Medication Dose Route Frequency Provider Last Rate Last Dose  . topical emolient (BIAFINE) emulsion   Topical BID Blair Promise, MD       Labs:  Lab Results  Component Value Date   WBC 3.2* 03/15/2013   HGB 11.5* 03/15/2013   HCT 34.0* 03/15/2013   MCV 87.4 03/15/2013   PLT 192 03/15/2013   Lab Results  Component Value Date   CREATININE 0.8 03/15/2013   BUN 6.2* 03/15/2013   NA 138 03/15/2013   K 3.9 03/15/2013   CL 101 12/30/2012   CO2 25 03/15/2013   Lab Results  Component Value Date   ALT 27 03/15/2013   AST 22 03/15/2013   BILITOT 0.93 03/15/2013    Physical Examination:  Filed Vitals:   03/16/13 1151  BP: 101/51  Pulse: 81  Temp: 97.8 F (36.6 C)    Wt Readings from Last 3 Encounters:  03/16/13 185 lb 14.4 oz (84.324 kg)  03/09/13 188 lb (85.276 kg)  03/08/13 185 lb (83.915 kg)    The upper back area shows significant erythema and dry desquamation with impending moist desquamation. Lungs - Normal respiratory  effort, chest expands symmetrically. Lungs are clear to auscultation, no crackles or wheezes.  Heart has regular rhythm and rate  Abdomen is soft and non tender with normal bowel sounds  Assessment:  Patient tolerating treatments well  Plan: Continue treatment per original radiation prescription. She has been given Biafine to use on her skin. I've also given her Silvadene in the event that she does develop moist desquamation over the next few days.

## 2013-03-17 ENCOUNTER — Ambulatory Visit
Admission: RE | Admit: 2013-03-17 | Discharge: 2013-03-17 | Disposition: A | Discharge status: Home / Self Care | Payer: Medicare Other | Source: Ambulatory Visit | Attending: Radiation Oncology | Admitting: Radiation Oncology

## 2013-03-18 ENCOUNTER — Ambulatory Visit
Admission: RE | Admit: 2013-03-18 | Discharge: 2013-03-18 | Disposition: A | Discharge status: Home / Self Care | Payer: Medicare Other | Source: Ambulatory Visit | Attending: Radiation Oncology | Admitting: Radiation Oncology

## 2013-03-19 ENCOUNTER — Ambulatory Visit
Admission: RE | Admit: 2013-03-19 | Discharge: 2013-03-19 | Disposition: A | Discharge status: Home / Self Care | Payer: Medicare Other | Source: Ambulatory Visit | Attending: Radiation Oncology | Admitting: Radiation Oncology

## 2013-03-19 ENCOUNTER — Ambulatory Visit: Payer: Medicare Other

## 2013-03-22 ENCOUNTER — Telehealth: Payer: Self-pay | Admitting: Internal Medicine

## 2013-03-22 ENCOUNTER — Telehealth: Payer: Self-pay | Admitting: Oncology

## 2013-03-22 ENCOUNTER — Ambulatory Visit (HOSPITAL_BASED_OUTPATIENT_CLINIC_OR_DEPARTMENT_OTHER): Payer: Medicare Other | Admitting: Physician Assistant

## 2013-03-22 ENCOUNTER — Ambulatory Visit: Payer: Medicare Other

## 2013-03-22 ENCOUNTER — Encounter: Payer: Self-pay | Admitting: Radiation Oncology

## 2013-03-22 ENCOUNTER — Encounter: Payer: Self-pay | Admitting: Physician Assistant

## 2013-03-22 ENCOUNTER — Ambulatory Visit
Admission: RE | Admit: 2013-03-22 | Discharge: 2013-03-22 | Disposition: A | Discharge status: Home / Self Care | Payer: Medicare Other | Source: Ambulatory Visit | Attending: Radiation Oncology | Admitting: Radiation Oncology

## 2013-03-22 ENCOUNTER — Other Ambulatory Visit (HOSPITAL_BASED_OUTPATIENT_CLINIC_OR_DEPARTMENT_OTHER): Payer: Medicare Other

## 2013-03-22 VITALS — BP 121/72 | HR 120 | Temp 98.5°F | Ht 66.0 in | Wt 180.0 lb

## 2013-03-22 VITALS — BP 118/75 | HR 117 | Temp 98.1°F | Resp 20 | Ht 66.0 in | Wt 180.4 lb

## 2013-03-22 DIAGNOSIS — C3410 Malignant neoplasm of upper lobe, unspecified bronchus or lung: Secondary | ICD-10-CM

## 2013-03-22 DIAGNOSIS — C3491 Malignant neoplasm of unspecified part of right bronchus or lung: Secondary | ICD-10-CM

## 2013-03-22 DIAGNOSIS — L589 Radiodermatitis, unspecified: Secondary | ICD-10-CM

## 2013-03-22 DIAGNOSIS — C771 Secondary and unspecified malignant neoplasm of intrathoracic lymph nodes: Secondary | ICD-10-CM

## 2013-03-22 LAB — CBC WITH DIFFERENTIAL/PLATELET
BASO%: 1.3 % (ref 0.0–2.0)
BASOS ABS: 0.1 10*3/uL (ref 0.0–0.1)
EOS%: 0.4 % (ref 0.0–7.0)
Eosinophils Absolute: 0 10*3/uL (ref 0.0–0.5)
HCT: 35.6 % (ref 34.8–46.6)
HGB: 12.2 g/dL (ref 11.6–15.9)
LYMPH#: 0.7 10*3/uL — AB (ref 0.9–3.3)
LYMPH%: 13.3 % — ABNORMAL LOW (ref 14.0–49.7)
MCH: 30.8 pg (ref 25.1–34.0)
MCHC: 34.2 g/dL (ref 31.5–36.0)
MCV: 90 fL (ref 79.5–101.0)
MONO#: 0.6 10*3/uL (ref 0.1–0.9)
MONO%: 12.6 % (ref 0.0–14.0)
NEUT#: 3.6 10*3/uL (ref 1.5–6.5)
NEUT%: 72.4 % (ref 38.4–76.8)
Platelets: 262 10*3/uL (ref 145–400)
RBC: 3.96 10*6/uL (ref 3.70–5.45)
RDW: 17.5 % — AB (ref 11.2–14.5)
WBC: 5 10*3/uL (ref 3.9–10.3)

## 2013-03-22 LAB — COMPREHENSIVE METABOLIC PANEL (CC13)
ALK PHOS: 95 U/L (ref 40–150)
ALT: 24 U/L (ref 0–55)
AST: 24 U/L (ref 5–34)
Albumin: 3.7 g/dL (ref 3.5–5.0)
Anion Gap: 12 mEq/L — ABNORMAL HIGH (ref 3–11)
BUN: 8.6 mg/dL (ref 7.0–26.0)
CHLORIDE: 103 meq/L (ref 98–109)
CO2: 25 meq/L (ref 22–29)
CREATININE: 1.1 mg/dL (ref 0.6–1.1)
Calcium: 9.8 mg/dL (ref 8.4–10.4)
GLUCOSE: 120 mg/dL (ref 70–140)
Potassium: 4.5 mEq/L (ref 3.5–5.1)
Sodium: 140 mEq/L (ref 136–145)
TOTAL PROTEIN: 7.8 g/dL (ref 6.4–8.3)
Total Bilirubin: 1.1 mg/dL (ref 0.20–1.20)

## 2013-03-22 MED ORDER — HYDROCODONE-ACETAMINOPHEN 5-300 MG PO TABS: 5.0000 mg | ORAL_TABLET | Freq: Every day | ORAL | Status: DC

## 2013-03-22 NOTE — Progress Notes (Signed)
[place]     Rexene Edison, M.D. [place], [place] [address]               Blair Promise, M.D., Ph.D. Phone: [phone]      Rodman Key A. Tammi Klippel, M.D. Fax: [phone]      Jodelle Gross, M.D., Ph.D.         Thea Silversmith, M.D.         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  Current Dose: 61.2 Gy   Current Fraction: 34  Planned Dose: 63 Gy  Narrative: Dawn Mercer was seen today for weekly treatment management. The chart was checked and CBCT  were reviewed. Patient is quite bothered by her skin reaction in the right upper back region. She has been placing Silvadene on this area twice daily. She has been given Vicodin for her pain.  she denies any chills or fever.  Latex and Codeine  Current Outpatient Prescriptions  Medication Sig Dispense Refill  . albuterol (PROVENTIL HFA;VENTOLIN HFA) 108 (90 BASE) MCG/ACT inhaler Inhale 1 puff into the lungs every 6 (six) hours as needed for wheezing.      Marland Kitchen ALPRAZolam (XANAX) 0.5 MG tablet Take 0.25 mg by mouth 2 (two) times daily as needed for anxiety.      . Cholecalciferol (VITAMIN D-3) 5000 UNITS TABS Take 5,000 Units by mouth 2 (two) times a week.      . clidinium-chlordiazePOXIDE (LIBRAX) 5-2.5 MG per capsule Take 1 capsule by mouth daily as needed (for upset stomach).      . diphenoxylate-atropine (LOMOTIL) 2.5-0.025 MG per tablet Take 1 tablet by mouth every Sunday.      . esomeprazole (NEXIUM) 40 MG capsule Take 40 mg by mouth daily before breakfast.      . Hydrocodone-Acetaminophen 5-300 MG TABS Take 5 mg by mouth 6 (six) times daily.  25 each  0  . levothyroxine (SYNTHROID, LEVOTHROID) 112 MCG  tablet Take 112 mcg by mouth daily before breakfast.      . lidocaine-prilocaine (EMLA) cream Apply 1 application topically as needed. Apply to port 1 hour before chemo appt      . LORazepam (ATIVAN) 1 MG tablet One tab po 30 min before MRI  1 tablet  0  . potassium chloride SA (K-DUR,KLOR-CON) 20 MEQ tablet Take 1 tablet (20 mEq total) by mouth 2 (two) times daily.  20 tablet  0  . prochlorperazine (COMPAZINE) 10 MG tablet Take 10 mg by mouth every 6 (six) hours as needed for nausea or vomiting.      . sucralfate (CARAFATE) 1 GM/10ML suspension Take 10 mLs (1 g total) by mouth 4 (four) times daily -  with meals and at bedtime.  420 mL  1  . temazepam (RESTORIL) 15 MG capsule Take 1 capsule (15 mg total) by mouth at bedtime as needed for sleep.  30 capsule  0  . traMADol (ULTRAM) 50 MG tablet Take 1 tablet (50 mg total) by mouth every 6 (six) hours as needed for moderate pain or severe pain.  50 tablet  0  . Wound Cleansers (RADIAPLEX EX) Apply 1 application topically 2 (two) times daily.      . silver sulfADIAZINE (SILVADENE) 1 % cream Apply 1 application topically 2 (two) times daily.       No current facility-administered medications for this encounter.   Labs:  Lab Results  Component Value Date   WBC 5.0 03/22/2013   HGB 12.2 03/22/2013   HCT 35.6 03/22/2013   MCV 90.0 03/22/2013   PLT 262 03/22/2013   Lab Results  Component Value Date   CREATININE 1.1 03/22/2013   BUN 8.6 03/22/2013   NA 140 03/22/2013   K 4.5 03/22/2013   CL 101 12/30/2012   CO2 25 03/22/2013   Lab Results  Component Value Date   ALT 24 03/22/2013   AST 24 03/22/2013   BILITOT 1.10 03/22/2013    Physical Examination:  Filed Vitals:   03/22/13 1448  BP: 121/72  Pulse: 120  Temp: 98.5 F (36.9 C)    Wt Readings from Last 3 Encounters:  03/22/13 180 lb (81.647 kg)  03/22/13 180 lb 6.4 oz (81.829 kg)  03/22/13 180 lb (81.647 kg)    Healing moist desquamation in the right upper back region. No signs of  infection Lungs - Normal respiratory effort, chest expands symmetrically. Lungs are clear to auscultation, no crackles or wheezes.  Heart has regular rhythm and rate  Abdomen is soft and non tender with normal bowel sounds  Assessment:  Patient tolerating treatments well  Plan: Continue treatment per original radiation prescription

## 2013-03-22 NOTE — Telephone Encounter (Signed)
Received a call from [place] with Medical Oncology.  She said Minervia has some skin breakdown on her right upper back and would like [place] to take a look at it after her treatment today.  Assured Adrena that we would have her come to nursing.

## 2013-03-22 NOTE — Progress Notes (Addendum)
Ms. Diffee seen today with desquamated area in her midback region.  Presently, this area is dry, but she reports that applying Silvadene as instructed by Dr. Sondra Come makes it moist.  She states the area hurts at a level 10 on a scale of 0-10.  She admits to feeling anxious due to her pain.

## 2013-03-22 NOTE — Telephone Encounter (Signed)
gv pt appt schedule for feb/march. [facility] will contact pt re scan appt - pt aware.

## 2013-03-22 NOTE — Progress Notes (Signed)
[facility] [phone]   [phone]  OFFICE PROGRESS NOTE   Melinda Crutch, MD [place] [address]  DIAGNOSIS: Stage IIIA (T1b., N2, M0) non-small cell lung cancer, squamous cell carcinoma presented with right upper lobe lung nodule in addition to mediastinal lymphadenopathy diagnosed in November of 2014.  PRIOR THERAPY: None  CURRENT THERAPY: Concurrent chemoradiation with weekly carboplatin for AUC of 2 and paclitaxel 45 mg/M2, status post 7 cycle.   CHEMOTHERAPY INTENT: Control/curative  CURRENT # OF CHEMOTHERAPY CYCLES: 7  CURRENT ANTIEMETICS: Zofran, dexamethasone and Compazine  CURRENT SMOKING STATUS: Former smoker.  ORAL CHEMOTHERAPY AND CONSENT: None  CURRENT BISPHOSPHONATES USE: None  PAIN MANAGEMENT: 0/10  NARCOTICS INDUCED CONSTIPATION: None  LIVING WILL AND CODE STATUS: Full code   INTERVAL HISTORY: Dawn Mercer 68 y.o. female returns to the clinic today for followup visit accompanied by her husband. The patient is feeling fine today with no specific complaints except for pain and discomfort in the upper back area related to her radiation therapy treatments. She is been applying the cream that she was given from radiation oncology as well as gauze pads.When they remove the gauze pad she feels that this aggravates this already raw area as it tends to pull  both the cream and also some skin as well. She otherwise is tolerating her course of concurrent chemoradiation fairly well with no significant adverse effects. She is scheduled to complete radiation therapy on 03/23/2013. She has no nausea or vomiting. She denied having any significant chest pain but continues to have shortness of breath with exertion. She denied hemoptysis. She has no significant weight loss or night sweats.   MEDICAL HISTORY: Past Medical History  Diagnosis Date  . 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 Status post radiation therapy 10/1999  . GERD (gastroesophageal reflux disease)   . Hypothyroidism   . IBS (irritable bowel syndrome)     Hx: of  . COPD (chronic obstructive pulmonary disease)   . Shortness of breath     Hx: of with exertion  . Bronchitis     Hx: of  . Hay fever     Hx: of  . Seizures     Hx: of over 40 years ago  . H/O hiatal hernia     ALLERGIES:  is allergic to latex and codeine.  MEDICATIONS:  Current Outpatient Prescriptions  Medication Sig Dispense Refill  . albuterol (PROVENTIL HFA;VENTOLIN HFA) 108 (90 BASE) MCG/ACT inhaler Inhale 1 puff into the lungs every 6 (six) hours as needed for wheezing.      Marland Kitchen ALPRAZolam (XANAX) 0.5 MG tablet Take 0.25 mg by mouth 2 (two) times daily as needed for anxiety.      . Cholecalciferol (VITAMIN D-3) 5000 UNITS TABS Take 5,000 Units by mouth 2 (two) times a week.      . clidinium-chlordiazePOXIDE (LIBRAX) 5-2.5 MG per capsule Take 1 capsule by mouth daily as needed (for upset stomach).      . diphenoxylate-atropine (LOMOTIL) 2.5-0.025 MG per tablet Take 1 tablet by mouth every Sunday.      . esomeprazole (NEXIUM) 40 MG capsule Take 40 mg by mouth daily before breakfast.      . levothyroxine (SYNTHROID, LEVOTHROID) 112 MCG tablet Take 112 mcg by mouth daily before breakfast.      . lidocaine-prilocaine (EMLA) cream Apply 1 application topically  as needed. Apply to port 1 hour before chemo appt      . LORazepam (ATIVAN) 1 MG tablet One tab po 30 min before MRI  1 tablet  0  . prochlorperazine (COMPAZINE) 10 MG tablet Take 10 mg by mouth every 6 (six) hours as needed for nausea or vomiting.      . sucralfate (CARAFATE) 1 GM/10ML suspension Take 10 mLs (1 g total) by mouth 4 (four) times daily -  with meals and at bedtime.  420 mL  1  . temazepam (RESTORIL) 15 MG capsule Take 1 capsule (15 mg total) by mouth at bedtime as needed for sleep.  30 capsule  0  . traMADol (ULTRAM)  50 MG tablet Take 1 tablet (50 mg total) by mouth every 6 (six) hours as needed for moderate pain or severe pain.  50 tablet  0  . Wound Cleansers (RADIAPLEX EX) Apply 1 application topically 2 (two) times daily.      . Hydrocodone-Acetaminophen 5-300 MG TABS Take 5 mg by mouth 6 (six) times daily.  25 each  0  . potassium chloride SA (K-DUR,KLOR-CON) 20 MEQ tablet Take 1 tablet (20 mEq total) by mouth 2 (two) times daily.  20 tablet  0  . silver sulfADIAZINE (SILVADENE) 1 % cream Apply 1 application topically 2 (two) times daily.       No current facility-administered medications for this visit.    SURGICAL HISTORY:  Past Surgical History  Procedure Laterality Date  . Laser ablation  04/2004    for VIN III  . Colonoscopy w/ biopsies and polypectomy      Hx: of  . Tubal ligation    . Video bronchoscopy with endobronchial navigation N/A 12/31/2012    Procedure: VIDEO BRONCHOSCOPY WITH ENDOBRONCHIAL NAVIGATION;  Surgeon: Melrose Nakayama, MD;  Location: [facility];  Service: Thoracic;  Laterality: N/A;  . Video bronchoscopy with endobronchial ultrasound N/A 12/31/2012    Procedure: VIDEO BRONCHOSCOPY WITH ENDOBRONCHIAL ULTRASOUND;  Surgeon: Melrose Nakayama, MD;  Location: [facility];  Service: Thoracic;  Laterality: N/A;    REVIEW OF SYSTEMS:  A comprehensive review of systems was negative except for: Integument/breast: positive for skin in the upper back region is tender and raw, secondary to radiation treatments   PHYSICAL EXAMINATION: General appearance: alert, cooperative and no distress Head: Normocephalic, without obvious abnormality, atraumatic Neck: no adenopathy, no JVD, supple, symmetrical, trachea midline and thyroid not enlarged, symmetric, no tenderness/mass/nodules Lymph nodes: Cervical, supraclavicular, and axillary nodes normal. Resp: clear to auscultation bilaterally Back: symmetric, no curvature. ROM normal. No CVA tenderness. Cardio: regular rate and rhythm, S1, S2 normal,  no murmur, click, rub or gallop GI: soft, non-tender; bowel sounds normal; no masses,  no organomegaly Extremities: extremities normal, atraumatic, no cyanosis or edema Skin: Upper back reveals an area approximately a 15-18 cm in length by approximately 6-8 cm in width. The scan is desquamated him on an erythematous base. There is no active bleeding, exudate or transudate. The area in general is tender to touch as well as the immediate adjacent skin  ECOG PERFORMANCE STATUS: 1 - Symptomatic but completely ambulatory  Blood pressure 118/75, pulse 117, temperature 98.1 F (36.7 C), temperature source Oral, resp. rate 20, height 5\' 6"  (1.676 m), weight 180 lb 6.4 oz (81.829 kg).  LABORATORY DATA: Lab Results  Component Value Date   WBC 5.0 03/22/2013   HGB 12.2 03/22/2013   HCT 35.6 03/22/2013   MCV 90.0 03/22/2013   PLT 262 03/22/2013  Chemistry      Component Value Date/Time   NA 140 03/22/2013 1111   NA 138 12/30/2012 1335   K 4.5 03/22/2013 1111   K 4.1 12/30/2012 1335   CL 101 12/30/2012 1335   CO2 25 03/22/2013 1111   CO2 28 12/30/2012 1335   BUN 8.6 03/22/2013 1111   BUN 8 12/30/2012 1335   CREATININE 1.1 03/22/2013 1111   CREATININE 0.86 12/30/2012 1335      Component Value Date/Time   CALCIUM 9.8 03/22/2013 1111   CALCIUM 9.2 12/30/2012 1335   ALKPHOS 95 03/22/2013 1111   ALKPHOS 89 12/30/2012 1335   AST 24 03/22/2013 1111   AST 20 12/30/2012 1335   ALT 24 03/22/2013 1111   ALT 17 12/30/2012 1335   BILITOT 1.10 03/22/2013 1111   BILITOT 0.7 12/30/2012 1335       RADIOGRAPHIC STUDIES: Mr Jeri Cos Wo Contrast  January 30, 2013   CLINICAL DATA:  Non-small-cell lung cancer. Rule out metastatic disease.  EXAM: MRI HEAD WITHOUT AND WITH CONTRAST  TECHNIQUE: Multiplanar, multiecho pulse sequences of the brain and surrounding structures were obtained without and with intravenous contrast.  CONTRAST:  29mL MULTIHANCE GADOBENATE DIMEGLUMINE 529 MG/ML IV SOLN  COMPARISON:  None.  FINDINGS:  Ventricle size is normal. Craniocervical junction is normal. No skull lesions are identified.  Small nonenhancing white matter hyperintensities, most consistent with chronic microvascular ischemia. Mild chronic changes in the pons.  Negative for acute infarct.  Negative for hemorrhage or mass.  Postcontrast imaging reveals normal enhancement. No metastatic deposits are identified  IMPRESSION: Negative for metastatic disease.  Mild chronic microvascular ischemic change.   Electronically Signed   By: Franchot Gallo M.D.   On: 01/30/2013 15:42   Ir Fluoro Guide Cv Line Right  01-30-2013   CLINICAL DATA:  Lung carcinoma, needs venous access for chemotherapy  EXAM: TUNNELED PORT CATHETER PLACEMENT WITH ULTRASOUND AND FLUOROSCOPIC GUIDANCE  TECHNIQUE: The procedure, risks, benefits, and alternatives were explained to the patient. Questions regarding the procedure were encouraged and answered. The patient understands and consents to the procedure. As antibiotic prophylaxis, cefazolin 2 g was ordered pre-procedure and administered intravenously within one hour of incision. Patency of the right IJ vein was confirmed with ultrasound with image documentation. An appropriate skin site was determined. Skin site was marked. Region was prepped using maximum barrier technique including cap and mask, sterile gown, sterile gloves, large sterile sheet, and Chlorhexidine as cutaneous antisepsis. The region was infiltrated locally with 1% lidocaine. Under real-time ultrasound guidance, the right IJ vein was accessed with a 21 gauge micropuncture needle; the needle tip within the vein was confirmed with ultrasound image documentation. Needle was exchanged over a 018 guidewire for transitional dilator which allowed passage of the [facility] wire into the IVC. Over this, the transitional dilator was exchanged for a 5 [place] MPA catheter. A small incision was made on the right anterior chest wall and a subcutaneous pocket fashioned. The  power-injectable port was positioned and its catheter tunneled to the right IJ dermatotomy site. The MPA catheter was exchanged over an Amplatz wire for a peel-away sheath, through which the port catheter, which had been trimmed to the appropriate length, was advanced and positioned under fluoroscopy with its tip at the cavoatrial junction. Spot chest radiograph confirms good catheter position and no pneumothorax. The pocket was closed with deep interrupted and subcuticular continuous 3-0 Monocryl sutures. The port was flushed per protocol. The incisions were covered with Dermabond then covered with a  sterile dressing. No immediate complication.  ANESTHESIA/SEDATION: Intravenous Fentanyl and Versed were administered as conscious sedation during continuous cardiorespiratory monitoring by the radiology RN, with a total moderate sedation time of twenty minutes.  FLUOROSCOPY TIME:  50932 seconds  IMPRESSION: Technically successful right IJ power-injectable port catheter placement. Ready for routine use.   Electronically Signed   By: Arne Cleveland M.D.   On: 01/20/2013 15:01   Ir US Guide Vasc Access Right  01/20/2013   CLINICAL DATA:  Lung carcinoma, needs venous access for chemotherapy  EXAM: TUNNELED PORT CATHETER PLACEMENT WITH ULTRASOUND AND FLUOROSCOPIC GUIDANCE  TECHNIQUE: The procedure, risks, benefits, and alternatives were explained to the patient. Questions regarding the procedure were encouraged and answered. The patient understands and consents to the procedure. As antibiotic prophylaxis, cefazolin 2 g was ordered pre-procedure and administered intravenously within one hour of incision. Patency of the right IJ vein was confirmed with ultrasound with image documentation. An appropriate skin site was determined. Skin site was marked. Region was prepped using maximum barrier technique including cap and mask, sterile gown, sterile gloves, large sterile sheet, and Chlorhexidine as cutaneous antisepsis. The  region was infiltrated locally with 1% lidocaine. Under real-time ultrasound guidance, the right IJ vein was accessed with a 21 gauge micropuncture needle; the needle tip within the vein was confirmed with ultrasound image documentation. Needle was exchanged over a 018 guidewire for transitional dilator which allowed passage of the [facility] wire into the IVC. Over this, the transitional dilator was exchanged for a 5 [place] MPA catheter. A small incision was made on the right anterior chest wall and a subcutaneous pocket fashioned. The power-injectable port was positioned and its catheter tunneled to the right IJ dermatotomy site. The MPA catheter was exchanged over an Amplatz wire for a peel-away sheath, through which the port catheter, which had been trimmed to the appropriate length, was advanced and positioned under fluoroscopy with its tip at the cavoatrial junction. Spot chest radiograph confirms good catheter position and no pneumothorax. The pocket was closed with deep interrupted and subcuticular continuous 3-0 Monocryl sutures. The port was flushed per protocol. The incisions were covered with Dermabond then covered with a sterile dressing. No immediate complication.  ANESTHESIA/SEDATION: Intravenous Fentanyl and Versed were administered as conscious sedation during continuous cardiorespiratory monitoring by the radiology RN, with a total moderate sedation time of twenty minutes.  FLUOROSCOPY TIME:  67124 seconds  IMPRESSION: Technically successful right IJ power-injectable port catheter placement. Ready for routine use.   Electronically Signed   By: Arne Cleveland M.D.   On: 01/20/2013 15:01   Ct Biopsy  01/11/2013   CLINICAL DATA:  1.7 cm hypermetabolic right upper lobe lung nodule. Previous bronchoscopy demonstrated a single cluster of atypical cells which was not adequate for definitive diagnosis. The patient has been referred for percutaneous biopsy.  EXAM: CT GUIDED CORE BIOPSY OF RIGHT LUNG   ANESTHESIA/SEDATION: 2.0  Mg IV Versed; 150 mcg IV Fentanyl  Total Moderate Sedation Time: 32 minutes.  PROCEDURE: The procedure risks, benefits, and alternatives were explained to the patient. Questions regarding the procedure were encouraged and answered. The patient understands and consents to the procedure.  The right anterior chest wall was prepped with Betadinein a sterile fashion, and a sterile drape was applied covering the operative field. A sterile gown and sterile gloves were used for the procedure. Local anesthesia was provided with 1% Lidocaine.  Localizing CT was performed in a supine position. Under CT guidance, a 17 gauge trocar needle  was advanced to the margin of the right upper lobe lung nodule. After confirming needle tip position, a total of 2 separate 18 gauge core biopsy samples were obtained. Post biopsy CT was performed after the outer needle removal.  Complications: Tiny amount of pleural air.  FINDINGS: Imaging demonstrates the anterior right upper lobe nodule measuring roughly 1.7 cm in greatest diameter. Antral lateral approach was performed. Core biopsy yielded solid tissue. During the procedure, a small amount of adjacent pleural air became evident. This will be followed by a chest x-ray 2 hr after the procedure.  IMPRESSION: CT-guided core biopsy performed of a 1.7 cm right upper lobe lung nodule. Solid tissue was obtained. Tiny pleural air/pneumothorax was present on biopsy completion which will be followed by chest x-ray.   Electronically Signed   By: Aletta Edouard M.D.   On: 01/11/2013 13:23   Dg Chest Port 1 View  01/11/2013   CLINICAL DATA:  Status post lung biopsy.  EXAM: PORTABLE CHEST - 1 VIEW  COMPARISON:  12/30/2012.  FINDINGS: No postprocedural pneumothorax is identified. The heart and lungs are stable.  IMPRESSION: No postprocedural pneumothorax.   Electronically Signed   By: Kalman Jewels M.D.   On: 01/11/2013 15:30    ASSESSMENT AND PLAN: This is a very  pleasant 68 years old white female recently diagnosed with a stage IIIa non-small cell lung cancer currently undergoing concurrent chemoradiation with weekly carboplatin and paclitaxel status post 3 weekly treatments. She is tolerating her treatment fairly well with no significant adverse effects except for skin changes related to radiation therapy. Patient was discussed with Dr. Julien Nordmann. She'll complete the remaining fractions of her radiation therapy her radiation oncology. I have made the desk nurse aware of the patient's upper back area and they will look at this region again for any management changes. I did advise the patient to get a Telfa or another brand of nonstick dressing material so as to avoid further irritating the skin. She'll follow with Dr. Julien Nordmann in approximately 4-5 weeks after completion of radiation therapy with a restaging CT scan of her chest with contrast to reevaluate her disease.  She was advised to call immediately if she has any concerning symptoms in the interval.  The patient voices understanding of current disease status and treatment options and is in agreement with the current care plan.  All questions were answered. The patient knows to call the clinic with any problems, questions or concerns. We can certainly see the patient much sooner if necessary.  Wynetta Emery, Tommi Crepeau E, PA-C

## 2013-03-23 ENCOUNTER — Ambulatory Visit: Payer: Medicare Other

## 2013-03-23 ENCOUNTER — Ambulatory Visit
Admission: RE | Admit: 2013-03-23 | Discharge: 2013-03-23 | Disposition: A | Discharge status: Home / Self Care | Payer: Medicare Other | Source: Ambulatory Visit | Attending: Radiation Oncology | Admitting: Radiation Oncology

## 2013-03-23 NOTE — Patient Instructions (Signed)
Follow with Dr. Julien Nordmann in approximately 4-5 weeks after completing your radiation therapy with a restaging CT scan of your chest to reevaluate your disease

## 2013-03-28 ENCOUNTER — Encounter: Payer: Self-pay | Admitting: Radiation Oncology

## 2013-03-28 NOTE — Progress Notes (Signed)
  Radiation Oncology         [phone] ________________________________  Name: Dawn Mercer MRN: [id number]  Date: 03/28/2013  DOB: 08-Feb-1946  End of Treatment Note  Diagnosis:    Stage IIIa (T1a, N2, Mx) non-small cell lung cancer (squamous cell carcinoma)    Indication for treatment:  Definitive treatment along with radiosensitizing chemotherapy       Radiation treatment dates:   December 4 through January 27  Site/dose:   Right upper lobe primary site and mediastinal area, 63 gray in 35 fractions  Beams/energy:   3-D conformal using a 4 field arrangement; 6 and 10 MV photons  Narrative: The patient tolerated radiation treatment relatively well.   She did experience some fatigue as well as some mild esophageal symptoms. She also developed a skin reaction in the right upper back region which responded well to Silvadene.  Plan: The patient has completed radiation treatment. The patient will return to radiation oncology clinic for routine followup in one month. I advised them to call or return sooner if they have any questions or concerns related to their recovery or treatment.  -----------------------------------  Blair Promise, PhD, MD

## 2013-03-31 ENCOUNTER — Encounter: Payer: Self-pay | Admitting: Oncology

## 2013-04-01 ENCOUNTER — Encounter: Payer: Self-pay | Admitting: Radiation Oncology

## 2013-04-01 ENCOUNTER — Ambulatory Visit
Admission: RE | Admit: 2013-04-01 | Discharge: 2013-04-01 | Disposition: A | Discharge status: Home / Self Care | Payer: Medicare Other | Source: Ambulatory Visit | Attending: Radiation Oncology | Admitting: Radiation Oncology

## 2013-04-01 ENCOUNTER — Telehealth: Payer: Self-pay | Admitting: Oncology

## 2013-04-01 VITALS — BP 111/77 | HR 103 | Temp 98.9°F | Ht 66.0 in | Wt 183.0 lb

## 2013-04-01 DIAGNOSIS — C3410 Malignant neoplasm of upper lobe, unspecified bronchus or lung: Secondary | ICD-10-CM

## 2013-04-01 NOTE — Telephone Encounter (Signed)
Called in to [facility] per Dr. Sondra Come -  Magic Mouthwash - equal parts Extra Strength Maalox Plus (cherry), diphenhydramine liquid 12.5 mg/5 ml (alcohol free), nystatin oral suspension USP 100,000 units/ml - take 5 ml by mouth four times a day PRN throat pain.  Dispense 360 ml, 1 refill.

## 2013-04-01 NOTE — Progress Notes (Signed)
Dawn Mercer here for follow up after treatment to her right upper lobe.  She denies pain today.  She also denies shortness of breath and hemoptysis.  She does have a cough.  She also reports a sore throat and feels like it is congested.  She is wondering if she can get magic mouthwash for it.  She denies problems swallowing.  The skin on her upper back is red, peeling and scabbed.  She is using silvadene once a day.

## 2013-04-01 NOTE — Progress Notes (Signed)
Radiation Oncology         [phone] ________________________________  Name: Dawn Mercer MRN: [id number]  Date: 04/01/2013  DOB: 30-Sep-1945  Follow-Up Visit Note  CC:  Melinda Crutch, MD  Antony Blackbird, MD  Diagnosis:  Stage IIIa (T1a, N2, Mx) non-small cell lung cancer (squamous cell carcinoma)   Interval Since Last Radiation:  1  weeks  Narrative:  The patient returns today for close followup. She developed moist desquamation in the back region from her radiation treatment. Her husband has been placing Silvadene twice daily. This area is itching but not hurting at this time. She denies any chills or fever.                              ALLERGIES:  is allergic to latex and codeine.  Meds: Current Outpatient Prescriptions  Medication Sig Dispense Refill  . albuterol (PROVENTIL HFA;VENTOLIN HFA) 108 (90 BASE) MCG/ACT inhaler Inhale 1 puff into the lungs every 6 (six) hours as needed for wheezing.      Marland Kitchen ALPRAZolam (XANAX) 0.5 MG tablet Take 0.25 mg by mouth 2 (two) times daily as needed for anxiety.      . Cholecalciferol (VITAMIN D-3) 5000 UNITS TABS Take 5,000 Units by mouth 2 (two) times a week.      . clidinium-chlordiazePOXIDE (LIBRAX) 5-2.5 MG per capsule Take 1 capsule by mouth daily as needed (for upset stomach).      . esomeprazole (NEXIUM) 40 MG capsule Take 40 mg by mouth daily before breakfast.      . Hydrocodone-Acetaminophen 5-300 MG TABS Take 5 mg by mouth 6 (six) times daily.  25 each  0  . levothyroxine (SYNTHROID, LEVOTHROID) 112 MCG tablet Take 112 mcg by mouth daily before breakfast.      . lidocaine-prilocaine (EMLA) cream Apply 1 application topically as needed. Apply to port 1 hour before chemo appt      . LORazepam (ATIVAN) 1 MG tablet One tab po 30 min before MRI  1 tablet  0  . potassium chloride SA (K-DUR,KLOR-CON) 20 MEQ tablet Take 1 tablet (20 mEq total) by mouth 2 (two) times daily.  20 tablet  0  . prochlorperazine (COMPAZINE) 10 MG tablet Take 10 mg  by mouth every 6 (six) hours as needed for nausea or vomiting.      . silver sulfADIAZINE (SILVADENE) 1 % cream Apply 1 application topically 2 (two) times daily.      . sucralfate (CARAFATE) 1 GM/10ML suspension Take 10 mLs (1 g total) by mouth 4 (four) times daily -  with meals and at bedtime.  420 mL  1  . temazepam (RESTORIL) 15 MG capsule Take 1 capsule (15 mg total) by mouth at bedtime as needed for sleep.  30 capsule  0  . traMADol (ULTRAM) 50 MG tablet Take 1 tablet (50 mg total) by mouth every 6 (six) hours as needed for moderate pain or severe pain.  50 tablet  0  . Alum & Mag Hydroxide-Simeth (MAGIC MOUTHWASH) SOLN Take 5 mLs by mouth 4 (four) times daily as needed for mouth pain. equal parts Extra Strength Maalox Plus (cherry), diphenhydramine liquid 12.5 mg/5 ml (alcohol free), nystatin oral suspension USP 100,000 units/ml. Disp. 360 ml with 1 refill.      . diphenoxylate-atropine (LOMOTIL) 2.5-0.025 MG per tablet Take 1 tablet by mouth every Sunday.      . Wound Cleansers (RADIAPLEX EX) Apply 1 application  topically 2 (two) times daily.       No current facility-administered medications for this encounter.    Physical Findings: The patient is in no acute distress. Patient is alert and oriented.  height is 5\' 6"  (1.676 m) and weight is 183 lb (83.008 kg). Her temperature is 98.9 F (37.2 C). Her blood pressure is 111/77 and her pulse is 103. Her oxygen saturation is 98%. .  The lungs are clear. The heart has a regular rhythm and rate. No palpable supraclavicular or axillary adenopathy.  The back area shows dry desquamation and erythema but no moist desquamation at this time. No signs of infection.  Lab Findings: Lab Results  Component Value Date   WBC 5.0 03/22/2013   HGB 12.2 03/22/2013   HCT 35.6 03/22/2013   MCV 90.0 03/22/2013   PLT 262 03/22/2013      Radiographic Findings: No results found.  Impression:  The patient is recovering from the effects of radiation.    Plan:   Patient will continue having Silvadene placed on the area for at least another week. She will undergo repeat scanning in the near future followed by medical oncology appt. routine followup in radiation oncology in 3 months.  ____________________________________ Blair Promise, MD

## 2013-04-23 ENCOUNTER — Encounter (HOSPITAL_COMMUNITY): Payer: Self-pay

## 2013-04-23 ENCOUNTER — Ambulatory Visit (HOSPITAL_COMMUNITY)
Admission: RE | Admit: 2013-04-23 | Discharge: 2013-04-23 | Disposition: A | Discharge status: Home / Self Care | Payer: Medicare Other | Source: Ambulatory Visit | Attending: Physician Assistant | Admitting: Physician Assistant

## 2013-04-23 ENCOUNTER — Other Ambulatory Visit (HOSPITAL_BASED_OUTPATIENT_CLINIC_OR_DEPARTMENT_OTHER): Payer: Medicare Other

## 2013-04-23 DIAGNOSIS — R0602 Shortness of breath: Secondary | ICD-10-CM | POA: Insufficient documentation

## 2013-04-23 DIAGNOSIS — C3410 Malignant neoplasm of upper lobe, unspecified bronchus or lung: Secondary | ICD-10-CM | POA: Insufficient documentation

## 2013-04-23 DIAGNOSIS — Z9221 Personal history of antineoplastic chemotherapy: Secondary | ICD-10-CM | POA: Insufficient documentation

## 2013-04-23 DIAGNOSIS — R05 Cough: Secondary | ICD-10-CM | POA: Insufficient documentation

## 2013-04-23 DIAGNOSIS — J449 Chronic obstructive pulmonary disease, unspecified: Secondary | ICD-10-CM | POA: Insufficient documentation

## 2013-04-23 DIAGNOSIS — C3490 Malignant neoplasm of unspecified part of unspecified bronchus or lung: Secondary | ICD-10-CM | POA: Insufficient documentation

## 2013-04-23 DIAGNOSIS — R059 Cough, unspecified: Secondary | ICD-10-CM | POA: Insufficient documentation

## 2013-04-23 DIAGNOSIS — Z923 Personal history of irradiation: Secondary | ICD-10-CM | POA: Insufficient documentation

## 2013-04-23 DIAGNOSIS — R918 Other nonspecific abnormal finding of lung field: Secondary | ICD-10-CM | POA: Insufficient documentation

## 2013-04-23 DIAGNOSIS — J4489 Other specified chronic obstructive pulmonary disease: Secondary | ICD-10-CM | POA: Insufficient documentation

## 2013-04-23 HISTORY — DX: Malignant neoplasm of upper lobe, unspecified bronchus or lung: C34.10

## 2013-04-23 LAB — COMPREHENSIVE METABOLIC PANEL (CC13)
ALT: 12 U/L (ref 0–55)
ANION GAP: 9 meq/L (ref 3–11)
AST: 18 U/L (ref 5–34)
Albumin: 3.5 g/dL (ref 3.5–5.0)
Alkaline Phosphatase: 112 U/L (ref 40–150)
BILIRUBIN TOTAL: 0.99 mg/dL (ref 0.20–1.20)
BUN: 6.7 mg/dL — ABNORMAL LOW (ref 7.0–26.0)
CO2: 28 meq/L (ref 22–29)
Calcium: 9.9 mg/dL (ref 8.4–10.4)
Chloride: 104 mEq/L (ref 98–109)
Creatinine: 0.8 mg/dL (ref 0.6–1.1)
Glucose: 97 mg/dl (ref 70–140)
Potassium: 4.6 mEq/L (ref 3.5–5.1)
Sodium: 141 mEq/L (ref 136–145)
Total Protein: 7.6 g/dL (ref 6.4–8.3)

## 2013-04-23 LAB — CBC WITH DIFFERENTIAL/PLATELET
BASO%: 0.7 % (ref 0.0–2.0)
Basophils Absolute: 0 10*3/uL (ref 0.0–0.1)
EOS%: 3.1 % (ref 0.0–7.0)
Eosinophils Absolute: 0.2 10*3/uL (ref 0.0–0.5)
HCT: 34.3 % — ABNORMAL LOW (ref 34.8–46.6)
HGB: 11.3 g/dL — ABNORMAL LOW (ref 11.6–15.9)
LYMPH%: 17 % (ref 14.0–49.7)
MCH: 32.2 pg (ref 25.1–34.0)
MCHC: 33.1 g/dL (ref 31.5–36.0)
MCV: 97.4 fL (ref 79.5–101.0)
MONO#: 0.6 10*3/uL (ref 0.1–0.9)
MONO%: 11.3 % (ref 0.0–14.0)
NEUT#: 3.7 10*3/uL (ref 1.5–6.5)
NEUT%: 67.9 % (ref 38.4–76.8)
PLATELETS: 217 10*3/uL (ref 145–400)
RBC: 3.52 10*6/uL — ABNORMAL LOW (ref 3.70–5.45)
RDW: 21.5 % — ABNORMAL HIGH (ref 11.2–14.5)
WBC: 5.4 10*3/uL (ref 3.9–10.3)
lymph#: 0.9 10*3/uL (ref 0.9–3.3)

## 2013-04-23 MED ORDER — IOHEXOL 300 MG/ML  SOLN
80.0000 mL | Freq: Once | INTRAMUSCULAR | Status: AC | PRN
  Administered 2013-04-23: 80 mL via INTRAVENOUS

## 2013-04-26 ENCOUNTER — Telehealth: Payer: Self-pay | Admitting: Internal Medicine

## 2013-04-26 ENCOUNTER — Telehealth: Payer: Self-pay | Admitting: *Deleted

## 2013-04-26 ENCOUNTER — Ambulatory Visit (HOSPITAL_BASED_OUTPATIENT_CLINIC_OR_DEPARTMENT_OTHER): Payer: Medicare Other | Admitting: Internal Medicine

## 2013-04-26 ENCOUNTER — Encounter: Payer: Self-pay | Admitting: Internal Medicine

## 2013-04-26 VITALS — BP 123/74 | HR 102 | Temp 98.7°F | Resp 17 | Ht 66.0 in | Wt 183.5 lb

## 2013-04-26 DIAGNOSIS — C3490 Malignant neoplasm of unspecified part of unspecified bronchus or lung: Secondary | ICD-10-CM

## 2013-04-26 DIAGNOSIS — Z8541 Personal history of malignant neoplasm of cervix uteri: Secondary | ICD-10-CM | POA: Insufficient documentation

## 2013-04-26 DIAGNOSIS — C3410 Malignant neoplasm of upper lobe, unspecified bronchus or lung: Secondary | ICD-10-CM

## 2013-04-26 NOTE — Progress Notes (Signed)
[place] [phone]   [phone]  OFFICE PROGRESS NOTE   Melinda Crutch, MD [place] [address]  DIAGNOSIS: Stage IIIA (T1b., N2, M0) non-small cell lung cancer, squamous cell carcinoma presented with right upper lobe lung nodule in addition to mediastinal lymphadenopathy diagnosed in November of 2014.  PRIOR THERAPY: Concurrent chemoradiation with weekly carboplatin for AUC of 2 and paclitaxel 45 mg/M2, status post 8 cycles, last dose was given 03/15/2013 with partial response.   CURRENT THERAPY: Consolidation chemotherapy with carboplatin for AUC of 5 and paclitaxel 175 mg/M2 every 3 weeks with Neulasta support. First dose 05/03/2013.   CHEMOTHERAPY INTENT: Control/curative  CURRENT # OF CHEMOTHERAPY CYCLES: 1  CURRENT ANTIEMETICS: Zofran, dexamethasone and Compazine  CURRENT SMOKING STATUS: Former smoker.  ORAL CHEMOTHERAPY AND CONSENT: None  CURRENT BISPHOSPHONATES USE: None  PAIN MANAGEMENT: 0/10  NARCOTICS INDUCED CONSTIPATION: None  LIVING WILL AND CODE STATUS: Full code   INTERVAL HISTORY: Dawn Mercer 68 y.o. female returns to the clinic today for followup visit accompanied by her husband. The patient is feeling fine today with no specific complaints. She tolerated her previous concurrent chemoradiation with weekly carboplatin and paclitaxel fairly well. The patient has been on observation for the last 6 weeks. She denied having any significant chest pain, shortness of breath, cough or hemoptysis. She denied having any nausea or vomiting. She has no significant weight loss or night sweats. The patient had repeat CT scan of the chest performed recently and she is here for evaluation and discussion of her scan results.  MEDICAL HISTORY: Past Medical History  Diagnosis Date  . 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 Status post radiation therapy 10/1999  . GERD (gastroesophageal reflux  disease)   . Hypothyroidism   . IBS (irritable bowel syndrome)     Hx: of  . COPD (chronic obstructive pulmonary disease)   . Shortness of breath     Hx: of with exertion  . Bronchitis     Hx: of  . Hay fever     Hx: of  . Seizures     Hx: of over 40 years ago  . H/O hiatal hernia   . History of radiation therapy 01/28/13-03/23/13    63 gray to right upper lobe  . 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 Lung cancer, upper lobe dx'd 12/2012    chemo and XRt complete    ALLERGIES:  is allergic to latex and codeine.  MEDICATIONS:  Current Outpatient Prescriptions  Medication Sig Dispense Refill  . albuterol (PROVENTIL HFA;VENTOLIN HFA) 108 (90 BASE) MCG/ACT inhaler Inhale 1 puff into the lungs every 6 (six) hours as needed for wheezing.      Marland Kitchen ALPRAZolam (XANAX) 0.5 MG tablet Take 0.25 mg by mouth 2 (two) times daily as needed for anxiety.      . Alum & Mag Hydroxide-Simeth (MAGIC MOUTHWASH) SOLN Take 5 mLs by mouth 4 (four) times daily as needed for mouth pain. equal parts Extra Strength Maalox Plus (cherry), diphenhydramine liquid 12.5 mg/5 ml (alcohol free), nystatin oral suspension USP 100,000 units/ml. Disp. 360 ml with 1 refill.      . Cholecalciferol (VITAMIN D-3) 5000 UNITS TABS Take 5,000 Units by mouth 2 (two) times a week.      . clidinium-chlordiazePOXIDE (LIBRAX) 5-2.5 MG per capsule Take 1 capsule by mouth daily as needed (for upset stomach).      Marland Kitchen  diphenoxylate-atropine (LOMOTIL) 2.5-0.025 MG per tablet Take 1 tablet by mouth every Sunday.      . esomeprazole (NEXIUM) 40 MG capsule Take 40 mg by mouth daily before breakfast.      . Hydrocodone-Acetaminophen 5-300 MG TABS Take 5 mg by mouth 6 (six) times daily.  25 each  0  . levothyroxine (SYNTHROID, LEVOTHROID) 112 MCG tablet Take 112 mcg by mouth daily before breakfast.      . lidocaine-prilocaine (EMLA) cream Apply 1 application topically as needed. Apply to port 1 hour before chemo appt      .  LORazepam (ATIVAN) 1 MG tablet One tab po 30 min before MRI  1 tablet  0  . potassium chloride SA (K-DUR,KLOR-CON) 20 MEQ tablet Take 1 tablet (20 mEq total) by mouth 2 (two) times daily.  20 tablet  0  . prochlorperazine (COMPAZINE) 10 MG tablet Take 10 mg by mouth every 6 (six) hours as needed for nausea or vomiting.      . silver sulfADIAZINE (SILVADENE) 1 % cream Apply 1 application topically 2 (two) times daily.      . sucralfate (CARAFATE) 1 GM/10ML suspension Take 10 mLs (1 g total) by mouth 4 (four) times daily -  with meals and at bedtime.  420 mL  1  . temazepam (RESTORIL) 15 MG capsule Take 1 capsule (15 mg total) by mouth at bedtime as needed for sleep.  30 capsule  0  . traMADol (ULTRAM) 50 MG tablet Take 1 tablet (50 mg total) by mouth every 6 (six) hours as needed for moderate pain or severe pain.  50 tablet  0  . Wound Cleansers (RADIAPLEX EX) Apply 1 application topically 2 (two) times daily.       No current facility-administered medications for this visit.    SURGICAL HISTORY:  Past Surgical History  Procedure Laterality Date  . Laser ablation  04/2004    for VIN III  . Colonoscopy w/ biopsies and polypectomy      Hx: of  . Tubal ligation    . Video bronchoscopy with endobronchial navigation N/A 12/31/2012    Procedure: VIDEO BRONCHOSCOPY WITH ENDOBRONCHIAL NAVIGATION;  Surgeon: Melrose Nakayama, MD;  Location: [facility];  Service: Thoracic;  Laterality: N/A;  . Video bronchoscopy with endobronchial ultrasound N/A 12/31/2012    Procedure: VIDEO BRONCHOSCOPY WITH ENDOBRONCHIAL ULTRASOUND;  Surgeon: Melrose Nakayama, MD;  Location: [facility];  Service: Thoracic;  Laterality: N/A;    REVIEW OF SYSTEMS:  A comprehensive review of systems was negative except for: Respiratory: positive for dyspnea on exertion   PHYSICAL EXAMINATION: General appearance: alert, cooperative and no distress Head: Normocephalic, without obvious abnormality, atraumatic Neck: no adenopathy, no JVD,  supple, symmetrical, trachea midline and thyroid not enlarged, symmetric, no tenderness/mass/nodules Lymph nodes: Cervical, supraclavicular, and axillary nodes normal. Resp: clear to auscultation bilaterally Back: symmetric, no curvature. ROM normal. No CVA tenderness. Cardio: regular rate and rhythm, S1, S2 normal, no murmur, click, rub or gallop GI: soft, non-tender; bowel sounds normal; no masses,  no organomegaly Extremities: extremities normal, atraumatic, no cyanosis or edema  ECOG PERFORMANCE STATUS: 1 - Symptomatic but completely ambulatory  Blood pressure 123/74, pulse 102, temperature 98.7 F (37.1 C), resp. rate 17, height 5\' 6"  (1.676 m), weight 183 lb 8 oz (83.235 kg), SpO2 95.00%.  LABORATORY DATA: Lab Results  Component Value Date   WBC 5.4 04/23/2013   HGB 11.3* 04/23/2013   HCT 34.3* 04/23/2013   MCV 97.4 04/23/2013  PLT 217 04/23/2013      Chemistry      Component Value Date/Time   NA 141 04/23/2013 1012   NA 138 12/30/2012 1335   K 4.6 04/23/2013 1012   K 4.1 12/30/2012 1335   CL 101 12/30/2012 1335   CO2 28 04/23/2013 1012   CO2 28 12/30/2012 1335   BUN 6.7* 04/23/2013 1012   BUN 8 12/30/2012 1335   CREATININE 0.8 04/23/2013 1012   CREATININE 0.86 12/30/2012 1335      Component Value Date/Time   CALCIUM 9.9 04/23/2013 1012   CALCIUM 9.2 12/30/2012 1335   ALKPHOS 112 04/23/2013 1012   ALKPHOS 89 12/30/2012 1335   AST 18 04/23/2013 1012   AST 20 12/30/2012 1335   ALT 12 04/23/2013 1012   ALT 17 12/30/2012 1335   BILITOT 0.99 04/23/2013 1012   BILITOT 0.7 12/30/2012 1335       RADIOGRAPHIC STUDIES: Ct Chest W Contrast  04/23/2013   CLINICAL DATA:  Non-small-cell lung cancer post chemotherapy and radiation therapy, past history cervical cancer in 2000, COPD ; now with cough, shortness of breath, recent bronchitis, post Z-Pak  EXAM: CT CHEST WITH CONTRAST  TECHNIQUE: Multidetector CT imaging of the chest was performed during intravenous contrast administration.  CONTRAST:   67mL OMNIPAQUE IOHEXOL 300 MG/ML  SOLN  COMPARISON:  CT chest 11/17/2012, PET-CT 12/17/2012  FINDINGS: Scattered atherosclerotic calcifications aorta.  Thoracic vascular structures grossly patent on nondedicated exam.  Mild fatty infiltration of liver.  Visualized upper abdomen otherwise normal appearance.  Normal size precarinal lymph node 7 mm short axis image 23, 13 mm on prior PET-CT.  Normal size subcarinal lymph node 7 mm short axis image 28, little changed.  No thoracic adenopathy.  Right jugular Port-A-Cath, tip at cavoatrial junction.  Severe emphysematous changes with central peribronchial thickening.  11 x 9 x 14 mm spiculated right upper lobe nodule image 24, previously 17 x 16 x 16 mm.  Three tiny peripheral left lower lobe nodules image 22, 26, and 27 and single right lower lobe subpleural nodule image 33 unchanged.  No new or enlarging pulmonary mass/nodule identified.  No acute infiltrate, pleural effusion or pneumothorax.  No acute osseous findings.  IMPRESSION: Interval decrease in size of right upper lobe neoplasm.  Stable tiny nonspecific peripheral pulmonary in nodules in lower lobes.  Emphysematous and chronic bronchitic changes consistent with COPD.  No evidence of metastatic disease.   Electronically Signed   By: Lavonia Dana M.D.   On: 04/23/2013 11:52   ASSESSMENT AND PLAN: This is a very pleasant 68 years old white female recently diagnosed with a stage IIIA non-small cell lung cancer currently undergoing concurrent chemoradiation with weekly carboplatin and paclitaxel status post 8 weekly treatment. She tolerated her treatment fairly well with no significant adverse effects. Her recent scan of the chest showed partial response with interval decrease in the size of the right upper lobe neoplasm as well as mediastinal lymphadenopathy. I discussed the scan results and showed the images to the patient and her husband today. I discussed with the patient several options for management of  her condition including referral to thoracic surgery for consideration of evaluation for surgical resection versus treatment with consolidation chemotherapy with carboplatin and paclitaxel versus observation and close monitoring. The patient is not interested in surgery at this point and she would like to proceed with the consolidation chemotherapy. I recommended for her 3 cycles of consolidation chemotherapy with carboplatin for AUC of 5 and paclitaxel  175 mg/M2 every 3 weeks with Neulasta support. I discussed with the patient adverse effect of the chemotherapy including but not limited to alopecia, myelosuppression, nausea and vomiting, peripheral neuropathy, liver or renal dysfunction. The patient is expected to start the first cycle of this treatment on 05/03/2013. She would come back for followup visit in 4 weeks with the start of cycle #2. She was advised to call immediately if she has any concerning symptoms in the interval.  The patient voices understanding of current disease status and treatment options and is in agreement with the current care plan.  All questions were answered. The patient knows to call the clinic with any problems, questions or concerns. We can certainly see the patient much sooner if necessary. I spent 15 minutes of face-to-face counseling with the patient and her husband today out of the total visit time 25 minutes.  Disclaimer: This note was dictated with voice recognition software. Similar sounding words can inadvertently be transcribed and may not be corrected upon review.

## 2013-04-26 NOTE — Telephone Encounter (Signed)
Per staff message and POF I have scheduled appts.  JMW  

## 2013-04-27 ENCOUNTER — Telehealth: Payer: Self-pay | Admitting: Internal Medicine

## 2013-05-03 ENCOUNTER — Other Ambulatory Visit (HOSPITAL_BASED_OUTPATIENT_CLINIC_OR_DEPARTMENT_OTHER): Payer: Medicare Other

## 2013-05-03 ENCOUNTER — Ambulatory Visit (HOSPITAL_BASED_OUTPATIENT_CLINIC_OR_DEPARTMENT_OTHER): Payer: Medicare Other

## 2013-05-03 ENCOUNTER — Other Ambulatory Visit: Payer: Medicare Other

## 2013-05-03 VITALS — BP 129/73 | HR 99 | Temp 97.1°F | Resp 18

## 2013-05-03 DIAGNOSIS — C3410 Malignant neoplasm of upper lobe, unspecified bronchus or lung: Secondary | ICD-10-CM

## 2013-05-03 DIAGNOSIS — C3490 Malignant neoplasm of unspecified part of unspecified bronchus or lung: Secondary | ICD-10-CM

## 2013-05-03 DIAGNOSIS — Z5111 Encounter for antineoplastic chemotherapy: Secondary | ICD-10-CM

## 2013-05-03 LAB — CBC WITH DIFFERENTIAL/PLATELET
BASO%: 0.4 % (ref 0.0–2.0)
BASOS ABS: 0 10*3/uL (ref 0.0–0.1)
EOS ABS: 0.1 10*3/uL (ref 0.0–0.5)
EOS%: 2.1 % (ref 0.0–7.0)
HEMATOCRIT: 34.6 % — AB (ref 34.8–46.6)
HEMOGLOBIN: 11.3 g/dL — AB (ref 11.6–15.9)
LYMPH%: 16.8 % (ref 14.0–49.7)
MCH: 31.8 pg (ref 25.1–34.0)
MCHC: 32.7 g/dL (ref 31.5–36.0)
MCV: 97.5 fL (ref 79.5–101.0)
MONO#: 0.5 10*3/uL (ref 0.1–0.9)
MONO%: 8.9 % (ref 0.0–14.0)
NEUT%: 71.8 % (ref 38.4–76.8)
NEUTROS ABS: 4.1 10*3/uL (ref 1.5–6.5)
PLATELETS: 203 10*3/uL (ref 145–400)
RBC: 3.55 10*6/uL — ABNORMAL LOW (ref 3.70–5.45)
RDW: 18.1 % — ABNORMAL HIGH (ref 11.2–14.5)
WBC: 5.6 10*3/uL (ref 3.9–10.3)
lymph#: 1 10*3/uL (ref 0.9–3.3)
nRBC: 0 % (ref 0–0)

## 2013-05-03 LAB — COMPREHENSIVE METABOLIC PANEL (CC13)
ALT: 13 U/L (ref 0–55)
AST: 15 U/L (ref 5–34)
Albumin: 3.4 g/dL — ABNORMAL LOW (ref 3.5–5.0)
Alkaline Phosphatase: 100 U/L (ref 40–150)
Anion Gap: 10 meq/L (ref 3–11)
BUN: 10.8 mg/dL (ref 7.0–26.0)
CO2: 26 meq/L (ref 22–29)
Calcium: 9.4 mg/dL (ref 8.4–10.4)
Chloride: 104 meq/L (ref 98–109)
Creatinine: 0.7 mg/dL (ref 0.6–1.1)
Glucose: 101 mg/dL (ref 70–140)
Potassium: 4.1 meq/L (ref 3.5–5.1)
Sodium: 141 meq/L (ref 136–145)
Total Bilirubin: 0.75 mg/dL (ref 0.20–1.20)
Total Protein: 7.4 g/dL (ref 6.4–8.3)

## 2013-05-03 MED ORDER — DEXAMETHASONE SODIUM PHOSPHATE 20 MG/5ML IJ SOLN
20.0000 mg | Freq: Once | INTRAMUSCULAR | Status: AC
  Administered 2013-05-03: 20 mg via INTRAVENOUS

## 2013-05-03 MED ORDER — DIPHENHYDRAMINE HCL 50 MG/ML IJ SOLN
50.0000 mg | Freq: Once | INTRAMUSCULAR | Status: AC
  Administered 2013-05-03: 25 mg via INTRAVENOUS

## 2013-05-03 MED ORDER — HEPARIN SOD (PORK) LOCK FLUSH 100 UNIT/ML IV SOLN
500.0000 [IU] | Freq: Once | INTRAVENOUS | Status: AC | PRN
  Administered 2013-05-03: 500 [IU]
  Filled 2013-05-03: qty 5

## 2013-05-03 MED ORDER — FAMOTIDINE IN NACL 20-0.9 MG/50ML-% IV SOLN
INTRAVENOUS | Status: AC
  Filled 2013-05-03: qty 50

## 2013-05-03 MED ORDER — SODIUM CHLORIDE 0.9 % IJ SOLN
10.0000 mL | INTRAMUSCULAR | Status: DC | PRN
  Administered 2013-05-03: 10 mL
  Filled 2013-05-03: qty 10

## 2013-05-03 MED ORDER — SODIUM CHLORIDE 0.9 % IV SOLN
488.5000 mg | Freq: Once | INTRAVENOUS | Status: AC
  Administered 2013-05-03: 490 mg via INTRAVENOUS
  Filled 2013-05-03: qty 49

## 2013-05-03 MED ORDER — DIPHENHYDRAMINE HCL 50 MG/ML IJ SOLN
INTRAMUSCULAR | Status: AC
Start: 2013-05-03 — End: 2013-05-03
  Filled 2013-05-03: qty 1

## 2013-05-03 MED ORDER — FAMOTIDINE IN NACL 20-0.9 MG/50ML-% IV SOLN
20.0000 mg | Freq: Once | INTRAVENOUS | Status: AC
  Administered 2013-05-03: 20 mg via INTRAVENOUS

## 2013-05-03 MED ORDER — ONDANSETRON 16 MG/50ML IVPB (CHCC)
16.0000 mg | Freq: Once | INTRAVENOUS | Status: AC
  Administered 2013-05-03: 16 mg via INTRAVENOUS

## 2013-05-03 MED ORDER — DEXAMETHASONE SODIUM PHOSPHATE 20 MG/5ML IJ SOLN
INTRAMUSCULAR | Status: AC
  Filled 2013-05-03: qty 5

## 2013-05-03 MED ORDER — PACLITAXEL CHEMO INJECTION 300 MG/50ML
175.0000 mg/m2 | Freq: Once | INTRAVENOUS | Status: AC
  Administered 2013-05-03: 342 mg via INTRAVENOUS
  Filled 2013-05-03: qty 57

## 2013-05-03 MED ORDER — SODIUM CHLORIDE 0.9 % IV SOLN
Freq: Once | INTRAVENOUS | Status: AC
  Administered 2013-05-03: 09:00:00 via INTRAVENOUS

## 2013-05-03 NOTE — Patient Instructions (Signed)
[facility] Discharge Instructions for Patients Receiving Chemotherapy  Today you received the following chemotherapy agents Taxol and Carboplatin.  To help prevent nausea and vomiting after your treatment, we encourage you to take your nausea medication.   If you develop nausea and vomiting that is not controlled by your nausea medication, call the clinic.   BELOW ARE SYMPTOMS THAT SHOULD BE REPORTED IMMEDIATELY:  *FEVER GREATER THAN 100.5 F  *CHILLS WITH OR WITHOUT FEVER  NAUSEA AND VOMITING THAT IS NOT CONTROLLED WITH YOUR NAUSEA MEDICATION  *UNUSUAL SHORTNESS OF BREATH  *UNUSUAL BRUISING OR BLEEDING  TENDERNESS IN MOUTH AND THROAT WITH OR WITHOUT PRESENCE OF ULCERS  *URINARY PROBLEMS  *BOWEL PROBLEMS  UNUSUAL RASH Items with * indicate a potential emergency and should be followed up as soon as possible.  Feel free to call the clinic you have any questions or concerns. The clinic phone number is [phone].    

## 2013-05-04 ENCOUNTER — Ambulatory Visit (HOSPITAL_BASED_OUTPATIENT_CLINIC_OR_DEPARTMENT_OTHER): Payer: Medicare Other

## 2013-05-04 ENCOUNTER — Telehealth: Payer: Self-pay | Admitting: *Deleted

## 2013-05-04 VITALS — BP 125/63 | HR 103 | Temp 98.4°F

## 2013-05-04 DIAGNOSIS — C3410 Malignant neoplasm of upper lobe, unspecified bronchus or lung: Secondary | ICD-10-CM

## 2013-05-04 DIAGNOSIS — Z5189 Encounter for other specified aftercare: Secondary | ICD-10-CM

## 2013-05-04 MED ORDER — PEGFILGRASTIM INJECTION 6 MG/0.6ML
6.0000 mg | Freq: Once | SUBCUTANEOUS | Status: AC
  Administered 2013-05-04: 6 mg via SUBCUTANEOUS
  Filled 2013-05-04: qty 0.6

## 2013-05-04 NOTE — Patient Instructions (Signed)

## 2013-05-04 NOTE — Telephone Encounter (Signed)
Dawn Mercer here for Neulasta injection following 1st carbo/taxol chemo treatment.  States that she is doing well, only had a little heartburn last evening.  Does have something ordered that she didn't take yesterday.  She has taken her Nexium today.  No nausea, vomiting, or diarrhea.  Is drinking lots of fluids and eating without difficulty.  All questions answered.  Knows to call if she has any problems or concerns.

## 2013-05-06 ENCOUNTER — Telehealth: Payer: Self-pay | Admitting: *Deleted

## 2013-05-06 ENCOUNTER — Encounter: Payer: Self-pay | Admitting: *Deleted

## 2013-05-06 NOTE — Telephone Encounter (Signed)
Pt called stating that he has pain in her legs and is very fatigued,  She wanted to know if the pain could be r/t the neulasta shot she had.  Informed her that it can cause aches and recomeneded that per Dr Vista Mink, she can take clartian for 5 days after getting the shot.  She can also take her pain medication prn.  She verbalized understanding.  SLJ

## 2013-05-06 NOTE — [facility] Oncology Navigator Note (Unsigned)
Pt called.  I listened as she explained symptoms of joint pain and fatigue after receiving "shot" yesterday.  I explained the reason for the symptoms.  I stated she could take tylenol to relief joint pain.  She verbalized understanding.  She stated she thought is was the "shot" but was not sure.  She appreciative the explanation.

## 2013-05-13 ENCOUNTER — Other Ambulatory Visit: Payer: Self-pay | Admitting: Radiation Oncology

## 2013-05-13 ENCOUNTER — Telehealth: Payer: Self-pay | Admitting: Oncology

## 2013-05-13 MED ORDER — HYDROCODONE-ACETAMINOPHEN 5-300 MG PO TABS: 5.0000 mg | ORAL_TABLET | Freq: Every day | ORAL | Status: DC

## 2013-05-13 NOTE — Telephone Encounter (Signed)
Dawn Mercer called asking for a refill on hydrocodone due to pain in her legs from "a shot for low platelets."  Prescription has been refilled by Dr. Sondra Come.  Called and spoke to [name] and notified him that the prescription is available for pick up [facility].

## 2013-05-14 ENCOUNTER — Telehealth: Payer: Self-pay | Admitting: Oncology

## 2013-05-14 NOTE — Telephone Encounter (Signed)
[facility] called regarding the script for hydrocodone/acetaminophen 5/300 mg tablets.  They said the do not carry 5/300 mg tablets.  They do carry 5/325 mg tablets.  Advised that it was OK to use 5/325 mg tablets.

## 2013-05-24 ENCOUNTER — Telehealth: Payer: Self-pay | Admitting: Internal Medicine

## 2013-05-24 ENCOUNTER — Ambulatory Visit (HOSPITAL_BASED_OUTPATIENT_CLINIC_OR_DEPARTMENT_OTHER): Payer: Medicare Other | Admitting: Internal Medicine

## 2013-05-24 ENCOUNTER — Other Ambulatory Visit (HOSPITAL_BASED_OUTPATIENT_CLINIC_OR_DEPARTMENT_OTHER): Payer: Medicare Other

## 2013-05-24 ENCOUNTER — Ambulatory Visit (HOSPITAL_BASED_OUTPATIENT_CLINIC_OR_DEPARTMENT_OTHER): Payer: Medicare Other

## 2013-05-24 ENCOUNTER — Encounter: Payer: Self-pay | Admitting: Internal Medicine

## 2013-05-24 VITALS — BP 132/67 | HR 106 | Temp 98.0°F | Resp 20 | Ht 66.0 in | Wt 182.4 lb

## 2013-05-24 DIAGNOSIS — C3410 Malignant neoplasm of upper lobe, unspecified bronchus or lung: Secondary | ICD-10-CM

## 2013-05-24 DIAGNOSIS — C771 Secondary and unspecified malignant neoplasm of intrathoracic lymph nodes: Secondary | ICD-10-CM

## 2013-05-24 DIAGNOSIS — E039 Hypothyroidism, unspecified: Secondary | ICD-10-CM

## 2013-05-24 DIAGNOSIS — C3490 Malignant neoplasm of unspecified part of unspecified bronchus or lung: Secondary | ICD-10-CM

## 2013-05-24 DIAGNOSIS — Z5111 Encounter for antineoplastic chemotherapy: Secondary | ICD-10-CM

## 2013-05-24 DIAGNOSIS — J449 Chronic obstructive pulmonary disease, unspecified: Secondary | ICD-10-CM

## 2013-05-24 LAB — COMPREHENSIVE METABOLIC PANEL (CC13)
ALT: 10 U/L (ref 0–55)
ANION GAP: 11 meq/L (ref 3–11)
AST: 17 U/L (ref 5–34)
Albumin: 3.5 g/dL (ref 3.5–5.0)
Alkaline Phosphatase: 107 U/L (ref 40–150)
BUN: 10.6 mg/dL (ref 7.0–26.0)
CALCIUM: 9.5 mg/dL (ref 8.4–10.4)
CHLORIDE: 104 meq/L (ref 98–109)
CO2: 24 meq/L (ref 22–29)
Creatinine: 0.8 mg/dL (ref 0.6–1.1)
Glucose: 84 mg/dl (ref 70–140)
Potassium: 4 mEq/L (ref 3.5–5.1)
Sodium: 139 mEq/L (ref 136–145)
Total Bilirubin: 0.62 mg/dL (ref 0.20–1.20)
Total Protein: 7.5 g/dL (ref 6.4–8.3)

## 2013-05-24 LAB — CBC WITH DIFFERENTIAL/PLATELET
BASO%: 0.6 % (ref 0.0–2.0)
Basophils Absolute: 0 10*3/uL (ref 0.0–0.1)
EOS%: 1.1 % (ref 0.0–7.0)
Eosinophils Absolute: 0.1 10*3/uL (ref 0.0–0.5)
HCT: 32 % — ABNORMAL LOW (ref 34.8–46.6)
HGB: 10.6 g/dL — ABNORMAL LOW (ref 11.6–15.9)
LYMPH%: 20.4 % (ref 14.0–49.7)
MCH: 32.7 pg (ref 25.1–34.0)
MCHC: 33.1 g/dL (ref 31.5–36.0)
MCV: 98.8 fL (ref 79.5–101.0)
MONO#: 0.6 10*3/uL (ref 0.1–0.9)
MONO%: 12.2 % (ref 0.0–14.0)
NEUT#: 3.1 10*3/uL (ref 1.5–6.5)
NEUT%: 65.7 % (ref 38.4–76.8)
PLATELETS: 218 10*3/uL (ref 145–400)
RBC: 3.24 10*6/uL — ABNORMAL LOW (ref 3.70–5.45)
RDW: 16.7 % — ABNORMAL HIGH (ref 11.2–14.5)
WBC: 4.8 10*3/uL (ref 3.9–10.3)
lymph#: 1 10*3/uL (ref 0.9–3.3)
nRBC: 0 % (ref 0–0)

## 2013-05-24 MED ORDER — SODIUM CHLORIDE 0.9 % IV SOLN
488.5000 mg | Freq: Once | INTRAVENOUS | Status: AC
  Administered 2013-05-24: 490 mg via INTRAVENOUS
  Filled 2013-05-24: qty 49

## 2013-05-24 MED ORDER — ONDANSETRON 16 MG/50ML IVPB (CHCC)
INTRAVENOUS | Status: AC
  Filled 2013-05-24: qty 16

## 2013-05-24 MED ORDER — FAMOTIDINE IN NACL 20-0.9 MG/50ML-% IV SOLN
INTRAVENOUS | Status: AC
Start: 2013-05-24 — End: 2013-05-24
  Filled 2013-05-24: qty 50

## 2013-05-24 MED ORDER — FAMOTIDINE IN NACL 20-0.9 MG/50ML-% IV SOLN
20.0000 mg | Freq: Once | INTRAVENOUS | Status: AC
  Administered 2013-05-24: 20 mg via INTRAVENOUS

## 2013-05-24 MED ORDER — HEPARIN SOD (PORK) LOCK FLUSH 100 UNIT/ML IV SOLN
500.0000 [IU] | Freq: Once | INTRAVENOUS | Status: AC | PRN
  Administered 2013-05-24: 500 [IU]
  Filled 2013-05-24: qty 5

## 2013-05-24 MED ORDER — DIPHENHYDRAMINE HCL 50 MG/ML IJ SOLN
50.0000 mg | Freq: Once | INTRAMUSCULAR | Status: AC
  Administered 2013-05-24: 25 mg via INTRAVENOUS

## 2013-05-24 MED ORDER — DEXAMETHASONE SODIUM PHOSPHATE 20 MG/5ML IJ SOLN
INTRAMUSCULAR | Status: AC
  Filled 2013-05-24: qty 5

## 2013-05-24 MED ORDER — PACLITAXEL CHEMO INJECTION 300 MG/50ML
175.0000 mg/m2 | Freq: Once | INTRAVENOUS | Status: AC
  Administered 2013-05-24: 342 mg via INTRAVENOUS
  Filled 2013-05-24: qty 57

## 2013-05-24 MED ORDER — DEXAMETHASONE SODIUM PHOSPHATE 20 MG/5ML IJ SOLN
20.0000 mg | Freq: Once | INTRAMUSCULAR | Status: AC
  Administered 2013-05-24: 20 mg via INTRAVENOUS

## 2013-05-24 MED ORDER — DIPHENHYDRAMINE HCL 50 MG/ML IJ SOLN
INTRAMUSCULAR | Status: AC
  Filled 2013-05-24: qty 1

## 2013-05-24 MED ORDER — SODIUM CHLORIDE 0.9 % IV SOLN
Freq: Once | INTRAVENOUS | Status: AC
  Administered 2013-05-24: 12:00:00 via INTRAVENOUS

## 2013-05-24 MED ORDER — SODIUM CHLORIDE 0.9 % IJ SOLN
10.0000 mL | INTRAMUSCULAR | Status: DC | PRN
Start: 2013-05-24 — End: 2013-05-24
  Administered 2013-05-24: 10 mL
  Filled 2013-05-24: qty 10

## 2013-05-24 MED ORDER — ONDANSETRON 16 MG/50ML IVPB (CHCC)
16.0000 mg | Freq: Once | INTRAVENOUS | Status: AC
  Administered 2013-05-24: 16 mg via INTRAVENOUS

## 2013-05-24 NOTE — Progress Notes (Signed)
[facility] [phone]   [phone]  OFFICE PROGRESS NOTE   Melinda Crutch, MD [place] [address]  DIAGNOSIS: Stage IIIA (T1b., N2, M0) non-small cell lung cancer, squamous cell carcinoma presented with right upper lobe lung nodule in addition to mediastinal lymphadenopathy diagnosed in November of 2014.  PRIOR THERAPY: Concurrent chemoradiation with weekly carboplatin for AUC of 2 and paclitaxel 45 mg/M2, status post 8 cycles, last dose was given 03/15/2013 with partial response.  CURRENT THERAPY: Consolidation chemotherapy with carboplatin for AUC of 5 and paclitaxel 175 mg/M2 every 3 weeks with Neulasta support, status post 1 cycle. First dose 05/03/2013.   CHEMOTHERAPY INTENT: Control/curative  CURRENT # OF CHEMOTHERAPY CYCLES: 2  CURRENT ANTIEMETICS: Zofran, dexamethasone and Compazine  CURRENT SMOKING STATUS: Former smoker.  ORAL CHEMOTHERAPY AND CONSENT: None  CURRENT BISPHOSPHONATES USE: None  PAIN MANAGEMENT: 0/10  NARCOTICS INDUCED CONSTIPATION: None  LIVING WILL AND CODE STATUS: Full code   INTERVAL HISTORY: Dawn Mercer 68 y.o. female returns to the clinic today for followup visit accompanied by her husband. The patient is feeling fine today with no specific complaints. She tolerated the first cycle of her consolidation chemotherapy with carboplatin and paclitaxel fairly well except for mild peripheral neuropathy in the fingers and toes. She also has some aching pain after the Neulasta injection but she took Claritin and hydrocodone with improvement in her symptoms. She denied having any significant chest pain, shortness of breath, cough or hemoptysis. She denied having any nausea or vomiting. She has no significant weight loss or night sweats.   MEDICAL HISTORY: Past Medical History  Diagnosis Date  . 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 Status post radiation therapy 10/1999  .  GERD (gastroesophageal reflux disease)   . Hypothyroidism   . IBS (irritable bowel syndrome)     Hx: of  . COPD (chronic obstructive pulmonary disease)   . Shortness of breath     Hx: of with exertion  . Bronchitis     Hx: of  . Hay fever     Hx: of  . Seizures     Hx: of over 40 years ago  . H/O hiatal hernia   . History of radiation therapy 01/28/13-03/23/13    63 gray to right upper lobe  . 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 Lung cancer, upper lobe dx'd 12/2012    chemo and XRt complete    ALLERGIES:  is allergic to latex and codeine.  MEDICATIONS:  Current Outpatient Prescriptions  Medication Sig Dispense Refill  . albuterol (PROVENTIL HFA;VENTOLIN HFA) 108 (90 BASE) MCG/ACT inhaler Inhale 1 puff into the lungs every 6 (six) hours as needed for wheezing.      Marland Kitchen ALPRAZolam (XANAX) 0.5 MG tablet Take 0.25 mg by mouth 2 (two) times daily as needed for anxiety.      . Alum & Mag Hydroxide-Simeth (MAGIC MOUTHWASH) SOLN Take 5 mLs by mouth 4 (four) times daily as needed for mouth pain. equal parts Extra Strength Maalox Plus (cherry), diphenhydramine liquid 12.5 mg/5 ml (alcohol free), nystatin oral suspension USP 100,000 units/ml. Disp. 360 ml with 1 refill.      . Cholecalciferol (VITAMIN D-3) 5000 UNITS TABS Take 5,000 Units by mouth 2 (two) times a week.      . clidinium-chlordiazePOXIDE (LIBRAX) 5-2.5 MG per capsule Take 1 capsule by mouth daily as needed (for upset stomach).      Marland Kitchen  diphenoxylate-atropine (LOMOTIL) 2.5-0.025 MG per tablet Take 1 tablet by mouth every Sunday.      . esomeprazole (NEXIUM) 40 MG capsule Take 40 mg by mouth daily before breakfast.      . Hydrocodone-Acetaminophen 5-300 MG TABS Take 5 mg by mouth 6 (six) times daily. As needed for pain  25 each  0  . levothyroxine (SYNTHROID, LEVOTHROID) 112 MCG tablet Take 112 mcg by mouth daily before breakfast.      . lidocaine-prilocaine (EMLA) cream Apply 1 application topically as  needed. Apply to port 1 hour before chemo appt      . LORazepam (ATIVAN) 1 MG tablet One tab po 30 min before MRI  1 tablet  0  . potassium chloride SA (K-DUR,KLOR-CON) 20 MEQ tablet Take 1 tablet (20 mEq total) by mouth 2 (two) times daily.  20 tablet  0  . prochlorperazine (COMPAZINE) 10 MG tablet Take 10 mg by mouth every 6 (six) hours as needed for nausea or vomiting.      . silver sulfADIAZINE (SILVADENE) 1 % cream Apply 1 application topically 2 (two) times daily.      . sucralfate (CARAFATE) 1 GM/10ML suspension Take 10 mLs (1 g total) by mouth 4 (four) times daily -  with meals and at bedtime.  420 mL  1  . temazepam (RESTORIL) 15 MG capsule Take 1 capsule (15 mg total) by mouth at bedtime as needed for sleep.  30 capsule  0  . traMADol (ULTRAM) 50 MG tablet Take 1 tablet (50 mg total) by mouth every 6 (six) hours as needed for moderate pain or severe pain.  50 tablet  0  . Wound Cleansers (RADIAPLEX EX) Apply 1 application topically 2 (two) times daily.       No current facility-administered medications for this visit.    SURGICAL HISTORY:  Past Surgical History  Procedure Laterality Date  . Laser ablation  04/2004    for VIN III  . Colonoscopy w/ biopsies and polypectomy      Hx: of  . Tubal ligation    . Video bronchoscopy with endobronchial navigation N/A 12/31/2012    Procedure: VIDEO BRONCHOSCOPY WITH ENDOBRONCHIAL NAVIGATION;  Surgeon: Melrose Nakayama, MD;  Location: [facility];  Service: Thoracic;  Laterality: N/A;  . Video bronchoscopy with endobronchial ultrasound N/A 12/31/2012    Procedure: VIDEO BRONCHOSCOPY WITH ENDOBRONCHIAL ULTRASOUND;  Surgeon: Melrose Nakayama, MD;  Location: [facility];  Service: Thoracic;  Laterality: N/A;    REVIEW OF SYSTEMS:  A comprehensive review of systems was negative except for: Neurological: positive for paresthesia   PHYSICAL EXAMINATION: General appearance: alert, cooperative and no distress Head: Normocephalic, without obvious  abnormality, atraumatic Neck: no adenopathy, no JVD, supple, symmetrical, trachea midline and thyroid not enlarged, symmetric, no tenderness/mass/nodules Lymph nodes: Cervical, supraclavicular, and axillary nodes normal. Resp: clear to auscultation bilaterally Back: symmetric, no curvature. ROM normal. No CVA tenderness. Cardio: regular rate and rhythm, S1, S2 normal, no murmur, click, rub or gallop GI: soft, non-tender; bowel sounds normal; no masses,  no organomegaly Extremities: extremities normal, atraumatic, no cyanosis or edema  ECOG PERFORMANCE STATUS: 1 - Symptomatic but completely ambulatory  Blood pressure 132/67, pulse 106, temperature 98 F (36.7 C), temperature source Oral, resp. rate 20, height 5\' 6"  (1.676 m), weight 182 lb 6.4 oz (82.736 kg), SpO2 94.00%.  LABORATORY DATA: Lab Results  Component Value Date   WBC 4.8 05/24/2013   HGB 10.6* 05/24/2013   HCT 32.0* 05/24/2013  MCV 98.8 05/24/2013   PLT 218 05/24/2013      Chemistry      Component Value Date/Time   NA 141 05/03/2013 0813   NA 138 12/30/2012 1335   K 4.1 05/03/2013 0813   K 4.1 12/30/2012 1335   CL 101 12/30/2012 1335   CO2 26 05/03/2013 0813   CO2 28 12/30/2012 1335   BUN 10.8 05/03/2013 0813   BUN 8 12/30/2012 1335   CREATININE 0.7 05/03/2013 0813   CREATININE 0.86 12/30/2012 1335      Component Value Date/Time   CALCIUM 9.4 05/03/2013 0813   CALCIUM 9.2 12/30/2012 1335   ALKPHOS 100 05/03/2013 0813   ALKPHOS 89 12/30/2012 1335   AST 15 05/03/2013 0813   AST 20 12/30/2012 1335   ALT 13 05/03/2013 0813   ALT 17 12/30/2012 1335   BILITOT 0.75 05/03/2013 0813   BILITOT 0.7 12/30/2012 1335       RADIOGRAPHIC STUDIES:  ASSESSMENT AND PLAN: This is a very pleasant 68 years old white female recently diagnosed with a stage IIIA non-small cell lung cancer currently undergoing concurrent chemoradiation with weekly carboplatin and paclitaxel status post 8 weekly treatment and currently she is on consolidation chemotherapy with  carboplatin and paclitaxel is status post 1 cycle. She tolerated her first cycle of chemotherapy fairly well with no significant adverse effects. I recommended for the patient to proceed with cycle #2 today as scheduled. She would come back for followup visit in 3 weeks with the start of cycle #3 of her consolidation chemotherapy. She was advised to call immediately if she has any concerning symptoms in the interval.  The patient voices understanding of current disease status and treatment options and is in agreement with the current care plan.  All questions were answered. The patient knows to call the clinic with any problems, questions or concerns. We can certainly see the patient much sooner if necessary.  Disclaimer: This note was dictated with voice recognition software. Similar sounding words can inadvertently be transcribed and may not be corrected upon review.

## 2013-05-24 NOTE — Telephone Encounter (Signed)
[facility] AD[facility] PRINTED APPT SCHED AND AVS FOR PT FOR APRIL....SED ADDED TX.

## 2013-05-25 ENCOUNTER — Ambulatory Visit (HOSPITAL_BASED_OUTPATIENT_CLINIC_OR_DEPARTMENT_OTHER): Payer: Medicare Other

## 2013-05-25 VITALS — BP 130/66 | HR 106 | Temp 98.0°F

## 2013-05-25 DIAGNOSIS — C771 Secondary and unspecified malignant neoplasm of intrathoracic lymph nodes: Secondary | ICD-10-CM

## 2013-05-25 DIAGNOSIS — C3410 Malignant neoplasm of upper lobe, unspecified bronchus or lung: Secondary | ICD-10-CM

## 2013-05-25 DIAGNOSIS — Z5189 Encounter for other specified aftercare: Secondary | ICD-10-CM

## 2013-05-25 MED ORDER — PEGFILGRASTIM INJECTION 6 MG/0.6ML
6.0000 mg | Freq: Once | SUBCUTANEOUS | Status: AC
  Administered 2013-05-25: 6 mg via SUBCUTANEOUS
  Filled 2013-05-25: qty 0.6

## 2013-05-31 ENCOUNTER — Other Ambulatory Visit (HOSPITAL_BASED_OUTPATIENT_CLINIC_OR_DEPARTMENT_OTHER): Payer: Medicare Other

## 2013-05-31 DIAGNOSIS — C3410 Malignant neoplasm of upper lobe, unspecified bronchus or lung: Secondary | ICD-10-CM

## 2013-05-31 DIAGNOSIS — C3490 Malignant neoplasm of unspecified part of unspecified bronchus or lung: Secondary | ICD-10-CM

## 2013-05-31 LAB — CBC WITH DIFFERENTIAL/PLATELET
BASO%: 1 % (ref 0.0–2.0)
Basophils Absolute: 0 10*3/uL (ref 0.0–0.1)
EOS ABS: 0.1 10*3/uL (ref 0.0–0.5)
EOS%: 3.1 % (ref 0.0–7.0)
HCT: 30.9 % — ABNORMAL LOW (ref 34.8–46.6)
HEMOGLOBIN: 10.4 g/dL — AB (ref 11.6–15.9)
LYMPH#: 0.8 10*3/uL — AB (ref 0.9–3.3)
LYMPH%: 20.2 % (ref 14.0–49.7)
MCH: 33.8 pg (ref 25.1–34.0)
MCHC: 33.7 g/dL (ref 31.5–36.0)
MCV: 100.4 fL (ref 79.5–101.0)
MONO#: 0.7 10*3/uL (ref 0.1–0.9)
MONO%: 17.7 % — ABNORMAL HIGH (ref 0.0–14.0)
NEUT%: 58 % (ref 38.4–76.8)
NEUTROS ABS: 2.3 10*3/uL (ref 1.5–6.5)
Platelets: 138 10*3/uL — ABNORMAL LOW (ref 145–400)
RBC: 3.08 10*6/uL — ABNORMAL LOW (ref 3.70–5.45)
RDW: 15.8 % — AB (ref 11.2–14.5)
WBC: 3.9 10*3/uL (ref 3.9–10.3)

## 2013-05-31 LAB — COMPREHENSIVE METABOLIC PANEL (CC13)
ALBUMIN: 3.7 g/dL (ref 3.5–5.0)
ALT: 16 U/L (ref 0–55)
AST: 20 U/L (ref 5–34)
Alkaline Phosphatase: 107 U/L (ref 40–150)
Anion Gap: 14 mEq/L — ABNORMAL HIGH (ref 3–11)
BUN: 15.7 mg/dL (ref 7.0–26.0)
CALCIUM: 9.8 mg/dL (ref 8.4–10.4)
CHLORIDE: 104 meq/L (ref 98–109)
CO2: 26 mEq/L (ref 22–29)
Creatinine: 0.9 mg/dL (ref 0.6–1.1)
GLUCOSE: 116 mg/dL (ref 70–140)
POTASSIUM: 4.4 meq/L (ref 3.5–5.1)
Sodium: 144 mEq/L (ref 136–145)
Total Bilirubin: 0.6 mg/dL (ref 0.20–1.20)
Total Protein: 7.5 g/dL (ref 6.4–8.3)

## 2013-05-31 LAB — TECHNOLOGIST REVIEW

## 2013-06-07 ENCOUNTER — Other Ambulatory Visit (HOSPITAL_BASED_OUTPATIENT_CLINIC_OR_DEPARTMENT_OTHER): Payer: Medicare Other

## 2013-06-07 DIAGNOSIS — C3490 Malignant neoplasm of unspecified part of unspecified bronchus or lung: Secondary | ICD-10-CM

## 2013-06-07 DIAGNOSIS — C3410 Malignant neoplasm of upper lobe, unspecified bronchus or lung: Secondary | ICD-10-CM

## 2013-06-07 LAB — COMPREHENSIVE METABOLIC PANEL (CC13)
ALK PHOS: 116 U/L (ref 40–150)
ALT: 12 U/L (ref 0–55)
ANION GAP: 9 meq/L (ref 3–11)
AST: 18 U/L (ref 5–34)
Albumin: 3.6 g/dL (ref 3.5–5.0)
BILIRUBIN TOTAL: 0.54 mg/dL (ref 0.20–1.20)
BUN: 10 mg/dL (ref 7.0–26.0)
CO2: 28 meq/L (ref 22–29)
Calcium: 9.1 mg/dL (ref 8.4–10.4)
Chloride: 105 mEq/L (ref 98–109)
Creatinine: 0.8 mg/dL (ref 0.6–1.1)
Glucose: 84 mg/dl (ref 70–140)
Potassium: 4.1 mEq/L (ref 3.5–5.1)
SODIUM: 143 meq/L (ref 136–145)
TOTAL PROTEIN: 7.6 g/dL (ref 6.4–8.3)

## 2013-06-07 LAB — CBC WITH DIFFERENTIAL/PLATELET
BASO%: 0.3 % (ref 0.0–2.0)
Basophils Absolute: 0 10*3/uL (ref 0.0–0.1)
EOS%: 0.9 % (ref 0.0–7.0)
Eosinophils Absolute: 0.1 10*3/uL (ref 0.0–0.5)
HCT: 30.9 % — ABNORMAL LOW (ref 34.8–46.6)
HGB: 10.4 g/dL — ABNORMAL LOW (ref 11.6–15.9)
LYMPH%: 12.5 % — ABNORMAL LOW (ref 14.0–49.7)
MCH: 33.9 pg (ref 25.1–34.0)
MCHC: 33.8 g/dL (ref 31.5–36.0)
MCV: 100.3 fL (ref 79.5–101.0)
MONO#: 0.8 10*3/uL (ref 0.1–0.9)
MONO%: 10.1 % (ref 0.0–14.0)
NEUT#: 6 10*3/uL (ref 1.5–6.5)
NEUT%: 76.2 % (ref 38.4–76.8)
PLATELETS: 111 10*3/uL — AB (ref 145–400)
RBC: 3.08 10*6/uL — AB (ref 3.70–5.45)
RDW: 16.6 % — ABNORMAL HIGH (ref 11.2–14.5)
WBC: 7.8 10*3/uL (ref 3.9–10.3)
lymph#: 1 10*3/uL (ref 0.9–3.3)

## 2013-06-14 ENCOUNTER — Ambulatory Visit (HOSPITAL_BASED_OUTPATIENT_CLINIC_OR_DEPARTMENT_OTHER): Payer: Medicare Other

## 2013-06-14 ENCOUNTER — Ambulatory Visit (HOSPITAL_BASED_OUTPATIENT_CLINIC_OR_DEPARTMENT_OTHER): Payer: Medicare Other | Admitting: Physician Assistant

## 2013-06-14 ENCOUNTER — Encounter: Payer: Self-pay | Admitting: Physician Assistant

## 2013-06-14 ENCOUNTER — Other Ambulatory Visit (HOSPITAL_BASED_OUTPATIENT_CLINIC_OR_DEPARTMENT_OTHER): Payer: Medicare Other

## 2013-06-14 ENCOUNTER — Telehealth: Payer: Self-pay | Admitting: Internal Medicine

## 2013-06-14 VITALS — BP 111/73 | HR 95 | Temp 97.8°F | Resp 18 | Ht 66.0 in | Wt 180.6 lb

## 2013-06-14 DIAGNOSIS — R5381 Other malaise: Secondary | ICD-10-CM

## 2013-06-14 DIAGNOSIS — C3410 Malignant neoplasm of upper lobe, unspecified bronchus or lung: Secondary | ICD-10-CM

## 2013-06-14 DIAGNOSIS — R5383 Other fatigue: Secondary | ICD-10-CM

## 2013-06-14 DIAGNOSIS — C3490 Malignant neoplasm of unspecified part of unspecified bronchus or lung: Secondary | ICD-10-CM

## 2013-06-14 DIAGNOSIS — Z5111 Encounter for antineoplastic chemotherapy: Secondary | ICD-10-CM

## 2013-06-14 DIAGNOSIS — G609 Hereditary and idiopathic neuropathy, unspecified: Secondary | ICD-10-CM

## 2013-06-14 LAB — COMPREHENSIVE METABOLIC PANEL (CC13)
ALK PHOS: 105 U/L (ref 40–150)
ALT: 11 U/L (ref 0–55)
ANION GAP: 10 meq/L (ref 3–11)
AST: 18 U/L (ref 5–34)
Albumin: 3.6 g/dL (ref 3.5–5.0)
BUN: 11.7 mg/dL (ref 7.0–26.0)
CHLORIDE: 106 meq/L (ref 98–109)
CO2: 26 mEq/L (ref 22–29)
CREATININE: 0.8 mg/dL (ref 0.6–1.1)
Calcium: 9.7 mg/dL (ref 8.4–10.4)
Glucose: 88 mg/dl (ref 70–140)
Potassium: 4.4 mEq/L (ref 3.5–5.1)
Sodium: 142 mEq/L (ref 136–145)
TOTAL PROTEIN: 7.4 g/dL (ref 6.4–8.3)
Total Bilirubin: 0.58 mg/dL (ref 0.20–1.20)

## 2013-06-14 LAB — CBC WITH DIFFERENTIAL/PLATELET
BASO%: 0.2 % (ref 0.0–2.0)
BASOS ABS: 0 10*3/uL (ref 0.0–0.1)
EOS%: 0.5 % (ref 0.0–7.0)
Eosinophils Absolute: 0 10*3/uL (ref 0.0–0.5)
HEMATOCRIT: 30.1 % — AB (ref 34.8–46.6)
HEMOGLOBIN: 9.9 g/dL — AB (ref 11.6–15.9)
LYMPH%: 19.2 % (ref 14.0–49.7)
MCH: 33 pg (ref 25.1–34.0)
MCHC: 32.9 g/dL (ref 31.5–36.0)
MCV: 100.3 fL (ref 79.5–101.0)
MONO#: 0.6 10*3/uL (ref 0.1–0.9)
MONO%: 14.8 % — AB (ref 0.0–14.0)
NEUT#: 2.8 10*3/uL (ref 1.5–6.5)
NEUT%: 65.3 % (ref 38.4–76.8)
PLATELETS: 119 10*3/uL — AB (ref 145–400)
RBC: 3 10*6/uL — ABNORMAL LOW (ref 3.70–5.45)
RDW: 16.9 % — ABNORMAL HIGH (ref 11.2–14.5)
WBC: 4.3 10*3/uL (ref 3.9–10.3)
lymph#: 0.8 10*3/uL — ABNORMAL LOW (ref 0.9–3.3)

## 2013-06-14 MED ORDER — HEPARIN SOD (PORK) LOCK FLUSH 100 UNIT/ML IV SOLN
500.0000 [IU] | Freq: Once | INTRAVENOUS | Status: AC | PRN
  Administered 2013-06-14: 500 [IU]
  Filled 2013-06-14: qty 5

## 2013-06-14 MED ORDER — SODIUM CHLORIDE 0.9 % IV SOLN
150.0000 mg/m2 | Freq: Once | INTRAVENOUS | Status: AC
  Administered 2013-06-14: 294 mg via INTRAVENOUS
  Filled 2013-06-14: qty 49

## 2013-06-14 MED ORDER — FAMOTIDINE IN NACL 20-0.9 MG/50ML-% IV SOLN
20.0000 mg | Freq: Once | INTRAVENOUS | Status: AC
  Administered 2013-06-14: 20 mg via INTRAVENOUS

## 2013-06-14 MED ORDER — FAMOTIDINE IN NACL 20-0.9 MG/50ML-% IV SOLN
INTRAVENOUS | Status: AC
  Filled 2013-06-14: qty 50

## 2013-06-14 MED ORDER — ONDANSETRON 16 MG/50ML IVPB (CHCC)
16.0000 mg | Freq: Once | INTRAVENOUS | Status: AC
  Administered 2013-06-14: 16 mg via INTRAVENOUS

## 2013-06-14 MED ORDER — ONDANSETRON 16 MG/50ML IVPB (CHCC)
INTRAVENOUS | Status: AC
  Filled 2013-06-14: qty 16

## 2013-06-14 MED ORDER — SODIUM CHLORIDE 0.9 % IJ SOLN
10.0000 mL | INTRAMUSCULAR | Status: DC | PRN
  Administered 2013-06-14: 10 mL
  Filled 2013-06-14: qty 10

## 2013-06-14 MED ORDER — PACLITAXEL CHEMO INJECTION 300 MG/50ML: 150.0000 mg/m2 | Freq: Once | INTRAVENOUS | Status: DC

## 2013-06-14 MED ORDER — SODIUM CHLORIDE 0.9 % IV SOLN
Freq: Once | INTRAVENOUS | Status: AC
  Administered 2013-06-14: 12:00:00 via INTRAVENOUS

## 2013-06-14 MED ORDER — DEXAMETHASONE SODIUM PHOSPHATE 20 MG/5ML IJ SOLN
INTRAMUSCULAR | Status: AC
  Filled 2013-06-14: qty 5

## 2013-06-14 MED ORDER — DIPHENHYDRAMINE HCL 50 MG/ML IJ SOLN
50.0000 mg | Freq: Once | INTRAMUSCULAR | Status: AC
  Administered 2013-06-14: 25 mg via INTRAVENOUS

## 2013-06-14 MED ORDER — DIPHENHYDRAMINE HCL 50 MG/ML IJ SOLN
INTRAMUSCULAR | Status: AC
  Filled 2013-06-14: qty 1

## 2013-06-14 MED ORDER — SODIUM CHLORIDE 0.9 % IV SOLN
488.5000 mg | Freq: Once | INTRAVENOUS | Status: AC
  Administered 2013-06-14: 490 mg via INTRAVENOUS
  Filled 2013-06-14: qty 49

## 2013-06-14 MED ORDER — DEXAMETHASONE SODIUM PHOSPHATE 20 MG/5ML IJ SOLN
20.0000 mg | Freq: Once | INTRAMUSCULAR | Status: AC
  Administered 2013-06-14: 20 mg via INTRAVENOUS

## 2013-06-14 NOTE — Patient Instructions (Signed)
[facility] Discharge Instructions for Patients Receiving Chemotherapy  Today you received the following chemotherapy agents Taxol, carboplatin.  To help prevent nausea and vomiting after your treatment, we encourage you to take your nausea medication compazine 10mg  every 6 hours as needed.   If you develop nausea and vomiting that is not controlled by your nausea medication, call the clinic.   BELOW ARE SYMPTOMS THAT SHOULD BE REPORTED IMMEDIATELY:  *FEVER GREATER THAN 100.5 F  *CHILLS WITH OR WITHOUT FEVER  NAUSEA AND VOMITING THAT IS NOT CONTROLLED WITH YOUR NAUSEA MEDICATION  *UNUSUAL SHORTNESS OF BREATH  *UNUSUAL BRUISING OR BLEEDING  TENDERNESS IN MOUTH AND THROAT WITH OR WITHOUT PRESENCE OF ULCERS  *URINARY PROBLEMS  *BOWEL PROBLEMS  UNUSUAL RASH Items with * indicate a potential emergency and should be followed up as soon as possible.  Feel free to call the clinic you have any questions or concerns. The clinic phone number is [phone].

## 2013-06-14 NOTE — Telephone Encounter (Signed)
[facility] adn printed appt sched and avs for pt for April and May

## 2013-06-14 NOTE — Progress Notes (Addendum)
[place] [phone]   [phone]  SHARED VISIT PROGRESS NOTE  [name], MD [phone] [address] [place] [address]  DIAGNOSIS: Stage IIIA (T1b., N2, M0) non-small cell lung cancer, squamous cell carcinoma presented with right upper lobe lung nodule in addition to mediastinal lymphadenopathy diagnosed in November of 2014.  PRIOR THERAPY: Concurrent chemoradiation with weekly carboplatin for AUC of 2 and paclitaxel 45 mg/M2, status post 8 cycles, last dose was given 03/15/2013 with partial response.  CURRENT THERAPY: Consolidation chemotherapy with carboplatin for AUC of 5 and paclitaxel 175 mg/M2 every 3 weeks with Neulasta support, status post 2 cycles. First dose 05/03/2013.   CHEMOTHERAPY INTENT: Control/curative  CURRENT # OF CHEMOTHERAPY CYCLES: 3  CURRENT ANTIEMETICS: Zofran, dexamethasone and Compazine  CURRENT SMOKING STATUS: Former smoker.  ORAL CHEMOTHERAPY AND CONSENT: None  CURRENT BISPHOSPHONATES USE: None  PAIN MANAGEMENT: 0/10  NARCOTICS INDUCED CONSTIPATION: None  LIVING WILL AND CODE STATUS: Full code   INTERVAL HISTORY: Dawn Mercer 68 y.o. female returns to the clinic today for followup visit accompanied by her husband. The patient is feeling fine today with no specific complaints except for seasonal allergy syptoms. 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 She also has some aching pain after the Neulasta injection but she took Claritin and hydrocodone with improvement in her symptoms. She denied having any significant chest pain, shortness of breath, cough or hemoptysis. She denied having any nausea or vomiting. She has no significant weight loss or night sweats. She presents for cycle #3 of her consolidation chemotherapy.  MEDICAL  HISTORY: Past Medical History  Diagnosis Date  . 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 Status post radiation therapy 10/1999  . GERD (gastroesophageal reflux disease)   . Hypothyroidism   . IBS (irritable bowel syndrome)     Hx: of  . COPD (chronic obstructive pulmonary disease)   . Shortness of breath     Hx: of with exertion  . Bronchitis     Hx: of  . Hay fever     Hx: of  . Seizures     Hx: of over 40 years ago  . H/O hiatal hernia   . History of radiation therapy 01/28/13-03/23/13    63 gray to right upper lobe  . 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 Lung cancer, upper lobe dx'd 12/2012    chemo and XRt complete    ALLERGIES:  is allergic to latex and codeine.  MEDICATIONS:  Current Outpatient Prescriptions  Medication Sig Dispense Refill  . albuterol (PROVENTIL HFA;VENTOLIN HFA) 108 (90 BASE) MCG/ACT inhaler Inhale 1 puff into the lungs every 6 (six) hours as needed for wheezing.      Marland Kitchen ALPRAZolam (XANAX) 0.5 MG tablet Take 0.25 mg by mouth 2 (two) times daily as needed for anxiety.      . Cholecalciferol (VITAMIN D-3) 5000 UNITS TABS Take 5,000 Units by mouth 2 (two) times a week.      . esomeprazole (NEXIUM) 40 MG capsule Take 40 mg by mouth daily before breakfast.      . levothyroxine (SYNTHROID, LEVOTHROID) 112 MCG tablet Take 112 mcg by mouth daily before breakfast.      . lidocaine-prilocaine (EMLA) cream Apply 1 application topically as needed. Apply to port 1 hour before  chemo appt      . sucralfate (CARAFATE) 1 GM/10ML suspension Take 10 mLs (1 g total) by mouth 4 (four) times daily -  with meals and at bedtime.  420 mL  1  . Alum & Mag Hydroxide-Simeth (MAGIC MOUTHWASH) SOLN Take 5 mLs by mouth 4 (four) times daily as needed for mouth pain. equal parts Extra Strength Maalox Plus (cherry), diphenhydramine liquid 12.5 mg/5 ml (alcohol free), nystatin oral suspension USP 100,000 units/ml. Disp. 360 ml with 1 refill.      .  clidinium-chlordiazePOXIDE (LIBRAX) 5-2.5 MG per capsule Take 1 capsule by mouth daily as needed (for upset stomach).      . diphenoxylate-atropine (LOMOTIL) 2.5-0.025 MG per tablet Take 1 tablet by mouth every Sunday.      . Hydrocodone-Acetaminophen 5-300 MG TABS Take 5 mg by mouth 6 (six) times daily. As needed for pain  25 each  0  . LORazepam (ATIVAN) 1 MG tablet One tab po 30 min before MRI  1 tablet  0  . potassium chloride SA (K-DUR,KLOR-CON) 20 MEQ tablet Take 1 tablet (20 mEq total) by mouth 2 (two) times daily.  20 tablet  0  . prochlorperazine (COMPAZINE) 10 MG tablet Take 10 mg by mouth every 6 (six) hours as needed for nausea or vomiting.      . silver sulfADIAZINE (SILVADENE) 1 % cream Apply 1 application topically 2 (two) times daily.      . temazepam (RESTORIL) 15 MG capsule Take 1 capsule (15 mg total) by mouth at bedtime as needed for sleep.  30 capsule  0  . traMADol (ULTRAM) 50 MG tablet Take 1 tablet (50 mg total) by mouth every 6 (six) hours as needed for moderate pain or severe pain.  50 tablet  0  . Wound Cleansers (RADIAPLEX EX) Apply 1 application topically 2 (two) times daily.       No current facility-administered medications for this visit.   Facility-Administered Medications Ordered in Other Visits  Medication Dose Route Frequency Provider Last Rate Last Dose  . CARBOplatin (PARAPLATIN) 490 mg in sodium chloride 0.9 % 250 mL chemo infusion  490 mg Intravenous Once Curt Bears, MD 598 mL/hr at 06/14/13 1639 490 mg at 06/14/13 1639  . heparin lock flush 100 unit/mL  500 Units Intracatheter Once PRN Curt Bears, MD      . sodium chloride 0.9 % injection 10 mL  10 mL Intracatheter PRN Curt Bears, MD        SURGICAL HISTORY:  Past Surgical History  Procedure Laterality Date  . Laser ablation  04/2004    for VIN III  . Colonoscopy w/ biopsies and polypectomy      Hx: of  . Tubal ligation    . Video bronchoscopy with endobronchial navigation N/A  12/31/2012    Procedure: VIDEO BRONCHOSCOPY WITH ENDOBRONCHIAL NAVIGATION;  Surgeon: Melrose Nakayama, MD;  Location: [facility];  Service: Thoracic;  Laterality: N/A;  . Video bronchoscopy with endobronchial ultrasound N/A 12/31/2012    Procedure: VIDEO BRONCHOSCOPY WITH ENDOBRONCHIAL ULTRASOUND;  Surgeon: Melrose Nakayama, MD;  Location: [employer] [facility];  Service: Thoracic;  Laterality: N/A;    REVIEW OF SYSTEMS:  A comprehensive review of systems was negative except for: Ears, nose, mouth, throat, and face: positive for nasal congestion Respiratory: positive for cough and related to seasonaal allergies Neurological: positive for paresthesia and increased after the second cycle of consolidation chemotherapy   PHYSICAL EXAMINATION: General appearance: alert, cooperative and no distress Head: Normocephalic,  without obvious abnormality, atraumatic Neck: no adenopathy, no JVD, supple, symmetrical, trachea midline and thyroid not enlarged, symmetric, no tenderness/mass/nodules Lymph nodes: Cervical, supraclavicular, and axillary nodes normal. Resp: clear to auscultation bilaterally Back: symmetric, no curvature. ROM normal. No CVA tenderness. Cardio: regular rate and rhythm, S1, S2 normal, no murmur, click, rub or gallop GI: soft, non-tender; bowel sounds normal; no masses,  no organomegaly Extremities: extremities normal, atraumatic, no cyanosis or edema  ECOG PERFORMANCE STATUS: 1 - Symptomatic but completely ambulatory  Blood pressure 111/73, pulse 95, temperature 97.8 F (36.6 C), temperature source Oral, resp. rate 18, height 5\' 6"  (1.676 m), weight 180 lb 9.6 oz (81.92 kg), SpO2 96.00%.  LABORATORY DATA: Lab Results  Component Value Date   WBC 4.3 06/14/2013   HGB 9.9* 06/14/2013   HCT 30.1* 06/14/2013   MCV 100.3 06/14/2013   PLT 119* 06/14/2013      Chemistry      Component Value Date/Time   NA 142 06/14/2013 1027   NA 138 12/30/2012 1335   K 4.4 06/14/2013 1027   K 4.1 12/30/2012 1335    CL 101 12/30/2012 1335   CO2 26 06/14/2013 1027   CO2 28 12/30/2012 1335   BUN 11.7 06/14/2013 1027   BUN 8 12/30/2012 1335   CREATININE 0.8 06/14/2013 1027   CREATININE 0.86 12/30/2012 1335      Component Value Date/Time   CALCIUM 9.7 06/14/2013 1027   CALCIUM 9.2 12/30/2012 1335   ALKPHOS 105 06/14/2013 1027   ALKPHOS 89 12/30/2012 1335   AST 18 06/14/2013 1027   AST 20 12/30/2012 1335   ALT 11 06/14/2013 1027   ALT 17 12/30/2012 1335   BILITOT 0.58 06/14/2013 1027   BILITOT 0.7 12/30/2012 1335       RADIOGRAPHIC STUDIES:  ASSESSMENT AND PLAN: This is a very pleasant 68 years old white female recently diagnosed with a stage IIIA non-small cell lung cancer currently undergoing concurrent chemoradiation with weekly carboplatin and paclitaxel status post 8 weekly treatment and currently she is on consolidation chemotherapy with carboplatin and paclitaxel is status post 2 cycles. She tolerated her first cycle of chemotherapy fairly well with no significant adverse effects. She reports increased peripheral neuropathy in the fingers after cycle #2. The patient was discussed with and also seen by Dr. Julien Nordmann. She will proceed with cycle #3 of consolidation chemotherapy but will do so with the paclitaxel reduced to 150 mg/M2 secondary to the peripheral neuropathy symptoms. She will follow up with Dr. Julien Nordmann in approximately 4 weeks with a restaging CT scan of the chest with contrast to re-evaluate her disease.  She was advised to call immediately if she has any concerning symptoms in the interval.  The patient voices understanding of current disease status and treatment options and is in agreement with the current care plan.  All questions were answered. The patient knows to call the clinic with any problems, questions or concerns. We can certainly see the patient much sooner if necessary.  Carlton Adam, PA-C  ADDENDUM: Hematology/Oncology Attending: I had a face to face encounter with the  patient. I recommended her care plan. She is a very pleasant 68 years old white female with a stage IIIA non-small cell lung cancer status post concurrent chemoradiation and she is currently undergoing consolidation chemotherapy with carboplatin and paclitaxel status post 2 cycles. The patient is tolerating her treatment fairly well except for fatigue from the Neulasta injection in addition to worsening peripheral neuropathy especially on the fingers.  The patient would like to complete the course of her consolidation chemotherapy. We will proceed with cycle #3 today but I would reduce the dose of paclitaxel to 150 mg/M2 to avoid any worsening of her peripheral neuropathy. She would come back for follow up visit in one month with repeat CT scan of the chest for restaging of her disease.  Disclaimer: This note was dictated with voice recognition software. Similar sounding words can inadvertently be transcribed and may not be corrected upon review. Curt Bears, MD 06/16/2013

## 2013-06-14 NOTE — Progress Notes (Signed)
Discharged, ambulatory with spouse in no dsitress

## 2013-06-15 ENCOUNTER — Ambulatory Visit (HOSPITAL_BASED_OUTPATIENT_CLINIC_OR_DEPARTMENT_OTHER): Payer: Medicare Other

## 2013-06-15 VITALS — BP 113/92 | HR 103 | Temp 98.0°F

## 2013-06-15 DIAGNOSIS — Z5189 Encounter for other specified aftercare: Secondary | ICD-10-CM

## 2013-06-15 DIAGNOSIS — C771 Secondary and unspecified malignant neoplasm of intrathoracic lymph nodes: Secondary | ICD-10-CM

## 2013-06-15 DIAGNOSIS — C3410 Malignant neoplasm of upper lobe, unspecified bronchus or lung: Secondary | ICD-10-CM

## 2013-06-15 MED ORDER — PEGFILGRASTIM INJECTION 6 MG/0.6ML
6.0000 mg | Freq: Once | SUBCUTANEOUS | Status: AC
  Administered 2013-06-15: 6 mg via SUBCUTANEOUS
  Filled 2013-06-15: qty 0.6

## 2013-06-15 NOTE — Patient Instructions (Signed)
Follow up with Dr. Julien Nordmann in 4 weeks with a restaging CT scan of your chest to re-evaluate your disease

## 2013-06-21 ENCOUNTER — Other Ambulatory Visit (HOSPITAL_BASED_OUTPATIENT_CLINIC_OR_DEPARTMENT_OTHER): Payer: Medicare Other

## 2013-06-21 ENCOUNTER — Other Ambulatory Visit: Payer: Self-pay | Admitting: Radiation Oncology

## 2013-06-21 ENCOUNTER — Telehealth: Payer: Self-pay | Admitting: Oncology

## 2013-06-21 DIAGNOSIS — C3410 Malignant neoplasm of upper lobe, unspecified bronchus or lung: Secondary | ICD-10-CM

## 2013-06-21 DIAGNOSIS — C3490 Malignant neoplasm of unspecified part of unspecified bronchus or lung: Secondary | ICD-10-CM

## 2013-06-21 LAB — CBC WITH DIFFERENTIAL/PLATELET
BASO%: 0.5 % (ref 0.0–2.0)
Basophils Absolute: 0 10*3/uL (ref 0.0–0.1)
EOS ABS: 0.1 10*3/uL (ref 0.0–0.5)
EOS%: 1.4 % (ref 0.0–7.0)
HCT: 30.9 % — ABNORMAL LOW (ref 34.8–46.6)
HGB: 10.3 g/dL — ABNORMAL LOW (ref 11.6–15.9)
LYMPH%: 19.2 % (ref 14.0–49.7)
MCH: 33.2 pg (ref 25.1–34.0)
MCHC: 33.3 g/dL (ref 31.5–36.0)
MCV: 99.7 fL (ref 79.5–101.0)
MONO#: 0.6 10*3/uL (ref 0.1–0.9)
MONO%: 13.4 % (ref 0.0–14.0)
NEUT%: 65.5 % (ref 38.4–76.8)
NEUTROS ABS: 2.8 10*3/uL (ref 1.5–6.5)
Platelets: 71 10*3/uL — ABNORMAL LOW (ref 145–400)
RBC: 3.1 10*6/uL — ABNORMAL LOW (ref 3.70–5.45)
RDW: 16.2 % — AB (ref 11.2–14.5)
WBC: 4.3 10*3/uL (ref 3.9–10.3)
lymph#: 0.8 10*3/uL — ABNORMAL LOW (ref 0.9–3.3)

## 2013-06-21 LAB — COMPREHENSIVE METABOLIC PANEL (CC13)
ALBUMIN: 3.8 g/dL (ref 3.5–5.0)
ALK PHOS: 111 U/L (ref 40–150)
ALT: 19 U/L (ref 0–55)
AST: 20 U/L (ref 5–34)
Anion Gap: 11 mEq/L (ref 3–11)
BILIRUBIN TOTAL: 0.7 mg/dL (ref 0.20–1.20)
BUN: 13.7 mg/dL (ref 7.0–26.0)
CO2: 27 mEq/L (ref 22–29)
Calcium: 9.7 mg/dL (ref 8.4–10.4)
Chloride: 106 mEq/L (ref 98–109)
Creatinine: 0.8 mg/dL (ref 0.6–1.1)
GLUCOSE: 96 mg/dL (ref 70–140)
POTASSIUM: 4.3 meq/L (ref 3.5–5.1)
SODIUM: 144 meq/L (ref 136–145)
TOTAL PROTEIN: 7.4 g/dL (ref 6.4–8.3)

## 2013-06-21 MED ORDER — HYDROCODONE-ACETAMINOPHEN 5-300 MG PO TABS: 5.0000 mg | ORAL_TABLET | Freq: Every day | ORAL | Status: DC

## 2013-06-28 ENCOUNTER — Other Ambulatory Visit (HOSPITAL_BASED_OUTPATIENT_CLINIC_OR_DEPARTMENT_OTHER): Payer: Medicare Other

## 2013-06-28 DIAGNOSIS — C3490 Malignant neoplasm of unspecified part of unspecified bronchus or lung: Secondary | ICD-10-CM

## 2013-06-28 DIAGNOSIS — C3410 Malignant neoplasm of upper lobe, unspecified bronchus or lung: Secondary | ICD-10-CM

## 2013-06-28 LAB — CBC WITH DIFFERENTIAL/PLATELET
BASO%: 0.2 % (ref 0.0–2.0)
Basophils Absolute: 0 10*3/uL (ref 0.0–0.1)
EOS%: 0.8 % (ref 0.0–7.0)
Eosinophils Absolute: 0.1 10*3/uL (ref 0.0–0.5)
HEMATOCRIT: 29.4 % — AB (ref 34.8–46.6)
HGB: 9.7 g/dL — ABNORMAL LOW (ref 11.6–15.9)
LYMPH%: 10.9 % — ABNORMAL LOW (ref 14.0–49.7)
MCH: 33 pg (ref 25.1–34.0)
MCHC: 33 g/dL (ref 31.5–36.0)
MCV: 100 fL (ref 79.5–101.0)
MONO#: 0.5 10*3/uL (ref 0.1–0.9)
MONO%: 8.8 % (ref 0.0–14.0)
NEUT#: 4.8 10*3/uL (ref 1.5–6.5)
NEUT%: 79.3 % — AB (ref 38.4–76.8)
PLATELETS: 84 10*3/uL — AB (ref 145–400)
RBC: 2.94 10*6/uL — AB (ref 3.70–5.45)
RDW: 16.8 % — ABNORMAL HIGH (ref 11.2–14.5)
WBC: 6.1 10*3/uL (ref 3.9–10.3)
lymph#: 0.7 10*3/uL — ABNORMAL LOW (ref 0.9–3.3)

## 2013-06-28 LAB — COMPREHENSIVE METABOLIC PANEL (CC13)
ALK PHOS: 100 U/L (ref 40–150)
ALT: 9 U/L (ref 0–55)
ANION GAP: 12 meq/L — AB (ref 3–11)
AST: 15 U/L (ref 5–34)
Albumin: 3.4 g/dL — ABNORMAL LOW (ref 3.5–5.0)
BUN: 11.3 mg/dL (ref 7.0–26.0)
CO2: 24 meq/L (ref 22–29)
CREATININE: 0.7 mg/dL (ref 0.6–1.1)
Calcium: 9.6 mg/dL (ref 8.4–10.4)
Chloride: 108 mEq/L (ref 98–109)
Glucose: 95 mg/dl (ref 70–140)
Potassium: 4.1 mEq/L (ref 3.5–5.1)
Sodium: 143 mEq/L (ref 136–145)
Total Bilirubin: 0.54 mg/dL (ref 0.20–1.20)
Total Protein: 7.3 g/dL (ref 6.4–8.3)

## 2013-06-30 ENCOUNTER — Telehealth: Payer: Self-pay | Admitting: *Deleted

## 2013-06-30 ENCOUNTER — Other Ambulatory Visit: Payer: Self-pay | Admitting: Internal Medicine

## 2013-06-30 DIAGNOSIS — C3410 Malignant neoplasm of upper lobe, unspecified bronchus or lung: Secondary | ICD-10-CM

## 2013-06-30 MED ORDER — GABAPENTIN 100 MG PO CAPS: 100.0000 mg | ORAL_CAPSULE | Freq: Three times a day (TID) | ORAL | Status: DC

## 2013-06-30 NOTE — Telephone Encounter (Signed)
Pt called with c/o of numbness and tingling in fingers.  She stated she is having a hard time putting on make up.  I spoke with Dr. Julien Nordmann. He has e-prescribed medication to pt pharmacy.  I called pt back and let her know.

## 2013-07-05 ENCOUNTER — Emergency Department (HOSPITAL_COMMUNITY): Payer: Medicare Other

## 2013-07-05 ENCOUNTER — Emergency Department (HOSPITAL_COMMUNITY)
Admission: EM | Admit: 2013-07-05 | Discharge: 2013-07-05 | Disposition: A | Discharge status: Home / Self Care | Payer: Medicare Other | Attending: Emergency Medicine | Admitting: Emergency Medicine

## 2013-07-05 ENCOUNTER — Encounter (HOSPITAL_COMMUNITY): Payer: Self-pay | Admitting: Emergency Medicine

## 2013-07-05 ENCOUNTER — Other Ambulatory Visit (HOSPITAL_BASED_OUTPATIENT_CLINIC_OR_DEPARTMENT_OTHER): Payer: Medicare Other

## 2013-07-05 DIAGNOSIS — J4489 Other specified chronic obstructive pulmonary disease: Secondary | ICD-10-CM | POA: Insufficient documentation

## 2013-07-05 DIAGNOSIS — K8020 Calculus of gallbladder without cholecystitis without obstruction: Secondary | ICD-10-CM | POA: Insufficient documentation

## 2013-07-05 DIAGNOSIS — C3490 Malignant neoplasm of unspecified part of unspecified bronchus or lung: Secondary | ICD-10-CM

## 2013-07-05 DIAGNOSIS — C3410 Malignant neoplasm of upper lobe, unspecified bronchus or lung: Secondary | ICD-10-CM

## 2013-07-05 DIAGNOSIS — Z5189 Encounter for other specified aftercare: Secondary | ICD-10-CM | POA: Insufficient documentation

## 2013-07-05 DIAGNOSIS — Z792 Long term (current) use of antibiotics: Secondary | ICD-10-CM | POA: Insufficient documentation

## 2013-07-05 DIAGNOSIS — Z8589 Personal history of malignant neoplasm of other organs and systems: Secondary | ICD-10-CM | POA: Insufficient documentation

## 2013-07-05 DIAGNOSIS — J449 Chronic obstructive pulmonary disease, unspecified: Secondary | ICD-10-CM | POA: Insufficient documentation

## 2013-07-05 DIAGNOSIS — Z91040 Latex allergy status: Secondary | ICD-10-CM | POA: Insufficient documentation

## 2013-07-05 DIAGNOSIS — Z87891 Personal history of nicotine dependence: Secondary | ICD-10-CM | POA: Insufficient documentation

## 2013-07-05 DIAGNOSIS — E039 Hypothyroidism, unspecified: Secondary | ICD-10-CM | POA: Insufficient documentation

## 2013-07-05 DIAGNOSIS — Z8709 Personal history of other diseases of the respiratory system: Secondary | ICD-10-CM | POA: Insufficient documentation

## 2013-07-05 DIAGNOSIS — K8050 Calculus of bile duct without cholangitis or cholecystitis without obstruction: Secondary | ICD-10-CM

## 2013-07-05 DIAGNOSIS — Z85118 Personal history of other malignant neoplasm of bronchus and lung: Secondary | ICD-10-CM | POA: Insufficient documentation

## 2013-07-05 DIAGNOSIS — K589 Irritable bowel syndrome without diarrhea: Secondary | ICD-10-CM | POA: Insufficient documentation

## 2013-07-05 DIAGNOSIS — Z79899 Other long term (current) drug therapy: Secondary | ICD-10-CM | POA: Insufficient documentation

## 2013-07-05 DIAGNOSIS — Z923 Personal history of irradiation: Secondary | ICD-10-CM | POA: Insufficient documentation

## 2013-07-05 DIAGNOSIS — K219 Gastro-esophageal reflux disease without esophagitis: Secondary | ICD-10-CM | POA: Insufficient documentation

## 2013-07-05 DIAGNOSIS — R1013 Epigastric pain: Secondary | ICD-10-CM | POA: Insufficient documentation

## 2013-07-05 LAB — CBC WITH DIFFERENTIAL/PLATELET
BASO%: 0.5 % (ref 0.0–2.0)
Basophils Absolute: 0 10*3/uL (ref 0.0–0.1)
EOS%: 0.4 % (ref 0.0–7.0)
Eosinophils Absolute: 0 10*3/uL (ref 0.0–0.5)
HEMATOCRIT: 30 % — AB (ref 34.8–46.6)
HEMOGLOBIN: 10.2 g/dL — AB (ref 11.6–15.9)
LYMPH#: 0.8 10*3/uL — AB (ref 0.9–3.3)
LYMPH%: 18.5 % (ref 14.0–49.7)
MCH: 34 pg (ref 25.1–34.0)
MCHC: 33.8 g/dL (ref 31.5–36.0)
MCV: 100.6 fL (ref 79.5–101.0)
MONO#: 0.4 10*3/uL (ref 0.1–0.9)
MONO%: 10.6 % (ref 0.0–14.0)
NEUT#: 2.9 10*3/uL (ref 1.5–6.5)
NEUT%: 70 % (ref 38.4–76.8)
PLATELETS: 137 10*3/uL — AB (ref 145–400)
RBC: 2.99 10*6/uL — ABNORMAL LOW (ref 3.70–5.45)
RDW: 17.6 % — ABNORMAL HIGH (ref 11.2–14.5)
WBC: 4.1 10*3/uL (ref 3.9–10.3)

## 2013-07-05 LAB — COMPREHENSIVE METABOLIC PANEL (CC13)
ALK PHOS: 151 U/L — AB (ref 40–150)
ALT: 53 U/L (ref 0–55)
AST: 27 U/L (ref 5–34)
Albumin: 3.5 g/dL (ref 3.5–5.0)
Anion Gap: 11 mEq/L (ref 3–11)
BUN: 9 mg/dL (ref 7.0–26.0)
CALCIUM: 9.7 mg/dL (ref 8.4–10.4)
CHLORIDE: 105 meq/L (ref 98–109)
CO2: 25 mEq/L (ref 22–29)
CREATININE: 0.8 mg/dL (ref 0.6–1.1)
Glucose: 125 mg/dl (ref 70–140)
Potassium: 3.9 mEq/L (ref 3.5–5.1)
Sodium: 142 mEq/L (ref 136–145)
Total Bilirubin: 0.69 mg/dL (ref 0.20–1.20)
Total Protein: 7.4 g/dL (ref 6.4–8.3)

## 2013-07-05 MED ORDER — ONDANSETRON HCL 4 MG PO TABS: 4.0000 mg | ORAL_TABLET | Freq: Four times a day (QID) | ORAL | Status: DC

## 2013-07-05 MED ORDER — HYDROMORPHONE HCL PF 1 MG/ML IJ SOLN
0.5000 mg | Freq: Once | INTRAMUSCULAR | Status: AC
  Administered 2013-07-05: 0.5 mg via INTRAVENOUS
  Filled 2013-07-05: qty 1

## 2013-07-05 MED ORDER — SODIUM CHLORIDE 0.9 % IV SOLN
INTRAVENOUS | Status: DC
  Administered 2013-07-05: 15:00:00 via INTRAVENOUS

## 2013-07-05 MED ORDER — ONDANSETRON HCL 4 MG/2ML IJ SOLN
4.0000 mg | Freq: Once | INTRAMUSCULAR | Status: AC
  Administered 2013-07-05: 4 mg via INTRAVENOUS
  Filled 2013-07-05: qty 2

## 2013-07-05 MED ORDER — OXYCODONE-ACETAMINOPHEN 5-325 MG PO TABS: 2.0000 | ORAL_TABLET | ORAL | Status: DC | PRN

## 2013-07-05 MED ORDER — HEPARIN SOD (PORK) LOCK FLUSH 100 UNIT/ML IV SOLN
INTRAVENOUS | Status: AC
  Administered 2013-07-05: 17:00:00
  Filled 2013-07-05: qty 5

## 2013-07-05 NOTE — ED Provider Notes (Signed)
CSN: [id number]     Arrival date & time 07/05/13  1317 History   First MD Initiated Contact with Patient 07/05/13 1331     Chief Complaint  Patient presents with  . Abdominal Pain     (Consider location/radiation/quality/duration/timing/severity/associated sxs/prior Treatment) HPI Comments: Presents to the ER for evaluation of right upper abdominal pain. Patient reports the pain began somewhat suddenly this morning. It has been constant and severe for at least 40 minutes. She has had nausea but no vomiting. Patient reports that she had a similar episode approximately one week ago that resolved before she could seek healthcare.  Patient is currently being treated for breast cancer. She has been administered chemotherapy and right side chest radiation therapy.  Patient is a 68 y.o. female presenting with abdominal pain.  Abdominal Pain   Past Medical History  Diagnosis Date  . 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 Status post radiation therapy 10/1999  . GERD (gastroesophageal reflux disease)   . Hypothyroidism   . IBS (irritable bowel syndrome)     Hx: of  . COPD (chronic obstructive pulmonary disease)   . Shortness of breath     Hx: of with exertion  . Bronchitis     Hx: of  . Hay fever     Hx: of  . Seizures     Hx: of over 40 years ago  . H/O hiatal hernia   . History of radiation therapy 01/28/13-03/23/13    63 gray to right upper lobe  . 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 Lung cancer, upper lobe dx'd 12/2012    chemo and XRt complete   Past Surgical History  Procedure Laterality Date  . Laser ablation  04/2004    for VIN III  . Colonoscopy w/ biopsies and polypectomy      Hx: of  . Tubal ligation    . Video bronchoscopy with endobronchial navigation N/A 12/31/2012    Procedure: VIDEO BRONCHOSCOPY WITH ENDOBRONCHIAL NAVIGATION;  Surgeon: Melrose Nakayama, MD;  Location: [facility];  Service: Thoracic;  Laterality: N/A;  .  Video bronchoscopy with endobronchial ultrasound N/A 12/31/2012    Procedure: VIDEO BRONCHOSCOPY WITH ENDOBRONCHIAL ULTRASOUND;  Surgeon: Melrose Nakayama, MD;  Location: [facility];  Service: Thoracic;  Laterality: N/A;   Family History  Problem Relation Age of Onset  . Heart disease Father   . Cervical cancer Sister   . Lung cancer Brother   . Heart disease Brother   . Cancer Brother     small cell lung cancer   History  Substance Use Topics  . Smoking status: Former Smoker -- 1.00 packs/day for 34 years    Start date: 02/25/1998    Quit date: 02/25/1998  . Smokeless tobacco: Never Used  . Alcohol Use: No   OB History   Grav Para Term Preterm Abortions TAB SAB Ect Mult Living                 Review of Systems  Gastrointestinal: Positive for abdominal pain.  All other systems reviewed and are negative.     Allergies  Latex and Codeine  Home Medications   Prior to Admission medications   Medication Sig Start Date End Date Taking? Authorizing Provider  albuterol (PROVENTIL HFA;VENTOLIN HFA) 108 (90 BASE) MCG/ACT inhaler Inhale 1 puff into the lungs every 6 (six) hours as needed for wheezing.    Historical Provider, MD  ALPRAZolam [name]) 0.5 MG tablet  Take 0.25 mg by mouth 2 (two) times daily as needed for anxiety. 01/14/13   Blair Promise, MD  Alum & Mag Hydroxide-Simeth (MAGIC MOUTHWASH) SOLN Take 5 mLs by mouth 4 (four) times daily as needed for mouth pain. equal parts Extra Strength Maalox Plus (cherry), diphenhydramine liquid 12.5 mg/5 ml (alcohol free), nystatin oral suspension USP 100,000 units/ml. Disp. 360 ml with 1 refill. 04/01/13   Blair Promise, MD  Cholecalciferol (VITAMIN D-3) 5000 UNITS TABS Take 5,000 Units by mouth 2 (two) times a week.    Historical Provider, MD  clidinium-chlordiazePOXIDE (LIBRAX) 5-2.5 MG per capsule Take 1 capsule by mouth daily as needed (for upset stomach).    Historical Provider, MD  diphenoxylate-atropine (LOMOTIL) 2.5-0.025 MG per  tablet Take 1 tablet by mouth every Sunday.    Historical Provider, MD  esomeprazole (NEXIUM) 40 MG capsule Take 40 mg by mouth daily before breakfast.    Historical Provider, MD  gabapentin (NEURONTIN) 100 MG capsule Take 1 capsule (100 mg total) by mouth 3 (three) times daily. 06/30/13   Curt Bears, MD  Hydrocodone-Acetaminophen 5-300 MG TABS Take 5 mg by mouth 6 (six) times daily. As needed for pain 06/21/13   Marye Round, MD  levothyroxine (SYNTHROID, LEVOTHROID) 112 MCG tablet Take 112 mcg by mouth daily before breakfast.    Historical Provider, MD  lidocaine-prilocaine (EMLA) cream Apply 1 application topically as needed. Apply to port 1 hour before chemo appt 01/18/13   Curt Bears, MD  LORazepam (ATIVAN) 1 MG tablet One tab po 30 min before MRI 01/15/13   Curt Bears, MD  potassium chloride SA (K-DUR,KLOR-CON) 20 MEQ tablet Take 1 tablet (20 mEq total) by mouth 2 (two) times daily. 03/08/13   Carlton Adam, PA-C  prochlorperazine (COMPAZINE) 10 MG tablet Take 10 mg by mouth every 6 (six) hours as needed for nausea or vomiting. 01/14/13   Curt Bears, MD  silver sulfADIAZINE (SILVADENE) 1 % cream Apply 1 application topically 2 (two) times daily.    Historical Provider, MD  sucralfate (CARAFATE) 1 GM/10ML suspension Take 10 mLs (1 g total) by mouth 4 (four) times daily -  with meals and at bedtime. 03/16/13   Blair Promise, MD  temazepam (RESTORIL) 15 MG capsule Take 1 capsule (15 mg total) by mouth at bedtime as needed for sleep. 02/09/13   Blair Promise, MD  traMADol (ULTRAM) 50 MG tablet Take 1 tablet (50 mg total) by mouth every 6 (six) hours as needed for moderate pain or severe pain. 02/02/13   Blair Promise, MD  Wound Cleansers (RADIAPLEX EX) Apply 1 application topically 2 (two) times daily.    Historical Provider, MD   BP 122/36  Pulse 79  Resp 18  SpO2 97% Physical Exam  Constitutional: She is oriented to person, place, and time. She appears well-developed  and well-nourished. No distress.  HENT:  Head: Normocephalic and atraumatic.  Right Ear: Hearing normal.  Left Ear: Hearing normal.  Nose: Nose normal.  Mouth/Throat: Oropharynx is clear and moist and mucous membranes are normal.  Eyes: Conjunctivae and EOM are normal. Pupils are equal, round, and reactive to light.  Neck: Normal range of motion. Neck supple.  Cardiovascular: Regular rhythm, S1 normal and S2 normal.  Exam reveals no gallop and no friction rub.   No murmur heard. Pulmonary/Chest: Effort normal and breath sounds normal. No respiratory distress. She exhibits tenderness.    Abdominal: Soft. Normal appearance and bowel sounds are normal. There  is no hepatosplenomegaly. There is tenderness in the right upper quadrant and epigastric area. There is no rebound, no guarding, no tenderness at McBurney's point and negative Murphy's sign. No hernia.  Musculoskeletal: Normal range of motion.  Neurological: She is alert and oriented to person, place, and time. She has normal strength. No cranial nerve deficit or sensory deficit. Coordination normal. GCS eye subscore is 4. GCS verbal subscore is 5. GCS motor subscore is 6.  Skin: Skin is warm, dry and intact. No rash noted. No cyanosis.  Psychiatric: She has a normal mood and affect. Her speech is normal and behavior is normal. Thought content normal.    ED Course  Procedures (including critical care time) Labs Review Labs Reviewed - No data to display  Imaging Review US Abdomen Complete  07/05/2013   CLINICAL DATA:  RUQ pain  EXAM: ULTRASOUND ABDOMEN COMPLETE  COMPARISON:  Prior ultrasound from 11/14/2011.  FINDINGS: Gallbladder:  Layering echogenic material with shadowing present within the gallbladder lumen, compatible with cholelithiasis. No sonographic Murphy sign noted. Gallbladder wall measured within normal limits at 1.4 mm.  Common bile duct:  Diameter: 7.5 mm, likely within normal limits for patient age.  Liver:  No focal  lesion identified. Diffusely increased echogenicity seen within the hepatic parenchyma is suggestive of steatosis.  IVC:  No abnormality visualized.  Pancreas:  Not well visualized due to overlying bowel gas.  Spleen:  Size and appearance within normal limits.  Right Kidney:  Length: 10.5 cm. Echogenicity within normal limits. No mass or hydronephrosis visualized.  Left Kidney:  Length: 12.3 cm. The left kidney was not well visualized due to overlying bowel gas.  Abdominal aorta:  No aneurysm visualized.  Other findings:  None.  IMPRESSION: 1. Cholelithiasis without sonographic evidence of acute cholecystitis. 2. Diffusely increased echogenicity within the liver, suggestive of steatosis.   Electronically Signed   By: Jeannine Boga M.D.   On: 07/05/2013 16:07     EKG Interpretation None      MDM   Final diagnoses:  None    Patient presents to the ER for evaluation of severe right upper quadrant pain. This is the second time she has had a similar episode. Examination revealed that she was very distressed with pain in the right upper abdominal area. She had significant tenderness in the upper quadrant and epigastric region. There is very slight tenderness over the right lower chest wall as well. Patient had blood work performed that was a long hospital earlier today. These were reviewed. She has mild pancytopenia secondary to chemotherapy, but LFTs are normal.  Ultrasound of abdomen was performed. It does show evidence of cholelithiasis without any evidence of acute cholecystitis. Patient was administered Dilaudid here in the ER and now has had resolution of her pain. Repeat examination reveals that she is comfortable. Presentation is consistent with biliary colic. She takes hydrocodone occasionally for her neuropathy, states does not help. Was given a prescription for Percocet to be used as needed for this biliary colic, followup with her oncologist tomorrow by phone to schedule followup with  General surgery. Return to the ER for symptoms worsen.  Does have lung cancer. This could be also a cause of pain on the right side, although as described above, pain is very convincingly abdominal, thoracic. Likewise, I do not have any concern for PE as cause of her pain. She is breathing comfortably now, pain-free.    Orpah Greek, MD 07/05/13 1719

## 2013-07-05 NOTE — Discharge Instructions (Signed)
Biliary Colic  °Biliary colic is a steady or irregular pain in the upper abdomen. It is usually under the right side of the rib cage. It happens when gallstones interfere with the normal flow of bile from the gallbladder. Bile is a liquid that helps to digest fats. Bile is made in the liver and stored in the gallbladder. When you eat a meal, bile passes from the gallbladder through the cystic duct and the common bile duct into the small intestine. There, it mixes with partially digested food. If a gallstone blocks either of these ducts, the normal flow of bile is blocked. The muscle cells in the bile duct contract forcefully to try to move the stone. This causes the pain of biliary colic.  °SYMPTOMS  °· A person with biliary colic usually complains of pain in the upper abdomen. This pain can be: °· In the center of the upper abdomen just below the breastbone. °· In the upper-right part of the abdomen, near the gallbladder and liver. °· Spread back toward the right shoulder blade. °· Nausea and vomiting. °· The pain usually occurs after eating. °· Biliary colic is usually triggered by the digestive system's demand for bile. The demand for bile is high after fatty meals. Symptoms can also occur when a person who has been fasting suddenly eats a very large meal. Most episodes of biliary colic pass after 1 to 5 hours. After the most intense pain passes, your abdomen may continue to ache mildly for about 24 hours. °DIAGNOSIS  °After you describe your symptoms, your caregiver will perform a physical exam. He or she will pay attention to the upper right portion of your belly (abdomen). This is the area of your liver and gallbladder. An ultrasound will help your caregiver look for gallstones. Specialized scans of the gallbladder may also be done. Blood tests may be done, especially if you have fever or if your pain persists. °PREVENTION  °Biliary colic can be prevented by controlling the risk factors for gallstones. Some of  these risk factors, such as heredity, increasing age, and pregnancy are a normal part of life. Obesity and a high-fat diet are risk factors you can change through a healthy lifestyle. Women going through menopause who take hormone replacement therapy (estrogen) are also more likely to develop biliary colic. °TREATMENT  °· Pain medication may be prescribed. °· You may be encouraged to eat a fat-free diet. °· If the first episode of biliary colic is severe, or episodes of colic keep retuning, surgery to remove the gallbladder (cholecystectomy) is usually recommended. This procedure can be done through small incisions using an instrument called a laparoscope. The procedure often requires a brief stay in the hospital. Some people can leave the hospital the same day. It is the most widely used treatment in people troubled by painful gallstones. It is effective and safe, with no complications in more than 90% of cases. °· If surgery cannot be done, medication that dissolves gallstones may be used. This medication is expensive and can take months or years to work. Only small stones will dissolve. °· Rarely, medication to dissolve gallstones is combined with a procedure called shock-wave lithotripsy. This procedure uses carefully aimed shock waves to break up gallstones. In many people treated with this procedure, gallstones form again within a few years. °PROGNOSIS  °If gallstones block your cystic duct or common bile duct, you are at risk for repeated episodes of biliary colic. There is also a 25% chance that you will develop   a gallbladder infection(acute cholecystitis), or some other complication of gallstones within 10 to 20 years. If you have surgery, schedule it at a time that is convenient for you and at a time when you are not sick. HOME CARE INSTRUCTIONS   Drink plenty of clear fluids.  Avoid fatty, greasy or fried foods, or any foods that make your pain worse.  Take medications as directed. SEEK MEDICAL  CARE IF:   You develop a fever over 100.5 F (38.1 C).  Your pain gets worse over time.  You develop nausea that prevents you from eating and drinking.  You develop vomiting. SEEK IMMEDIATE MEDICAL CARE IF:   You have continuous or severe belly (abdominal) pain which is not relieved with medications.  You develop nausea and vomiting which is not relieved with medications.  You have symptoms of biliary colic and you suddenly develop a fever and shaking chills. This may signal cholecystitis. Call your caregiver immediately.  You develop a yellow color to your skin or the white part of your eyes (jaundice). Document Released: 07/15/2005 Document Revised: 05/06/2011 Document Reviewed: 09/24/2007 [facility] Patient Information 2014 [place].  Cholelithiasis Cholelithiasis (also called gallstones) is a form of gallbladder disease in which gallstones form in your gallbladder. The gallbladder is an organ that stores bile made in the liver, which helps digest fats. Gallstones begin as small crystals and slowly grow into stones. Gallstone pain occurs when the gallbladder spasms and a gallstone is blocking the duct. Pain can also occur when a stone passes out of the duct.  RISK FACTORS  Being female.   Having multiple pregnancies. Health care providers sometimes advise removing diseased gallbladders before future pregnancies.   Being obese.  Eating a diet heavy in fried foods and fat.   Being older than 10 years and increasing age.   Prolonged use of medicines containing female hormones.   Having diabetes mellitus.   Rapidly losing weight.   Having a family history of gallstones (heredity).  SYMPTOMS  Nausea.   Vomiting.  Abdominal pain.   Yellowing of the skin (jaundice).   Sudden pain. It may persist from several minutes to several hours.  Fever.   Tenderness to the touch. In some cases, when gallstones do not move into the bile duct, people have no  pain or symptoms. These are called "silent" gallstones.  TREATMENT Silent gallstones do not need treatment. In severe cases, emergency surgery may be required. Options for treatment include:  Surgery to remove the gallbladder. This is the most common treatment.  Medicines. These do not always work and may take 6 12 months or more to work.  Shock wave treatment (extracorporeal biliary lithotripsy). In this treatment an ultrasound machine sends shock waves to the gallbladder to break gallstones into smaller pieces that can pass into the intestines or be dissolved by medicine. HOME CARE INSTRUCTIONS   Only take over-the-counter or prescription medicines for pain, discomfort, or fever as directed by your health care provider.   Follow a low-fat diet until seen again by your health care provider. Fat causes the gallbladder to contract, which can result in pain.   Follow up with your health care provider as directed. Attacks are almost always recurrent and surgery is usually required for permanent treatment.  SEEK IMMEDIATE MEDICAL CARE IF:   Your pain increases and is not controlled by medicines.   You have a fever or persistent symptoms for more than 2 3 days.   You have a fever and your symptoms  suddenly get worse.   You have persistent nausea and vomiting.  MAKE SURE YOU:   Understand these instructions.  Will watch your condition.  Will get help right away if you are not doing well or get worse. Document Released: 02/07/2005 Document Revised: 10/14/2012 Document Reviewed: 08/05/2012 [facility] Patient Information 2014 [place].

## 2013-07-05 NOTE — ED Notes (Signed)
RUQ pain , onset today, Had similar episode last week that lasted 40 min.  Nausea, no vomiting Being tx for lung cancer , seen at [facility].

## 2013-07-05 NOTE — Telephone Encounter (Signed)
Error

## 2013-07-08 ENCOUNTER — Encounter (INDEPENDENT_AMBULATORY_CARE_PROVIDER_SITE_OTHER): Payer: Self-pay | Admitting: Surgery

## 2013-07-08 ENCOUNTER — Ambulatory Visit: Payer: No Typology Code available for payment source | Admitting: Radiation Oncology

## 2013-07-12 ENCOUNTER — Other Ambulatory Visit (HOSPITAL_BASED_OUTPATIENT_CLINIC_OR_DEPARTMENT_OTHER): Payer: Medicare Other

## 2013-07-12 ENCOUNTER — Encounter (HOSPITAL_COMMUNITY): Payer: Self-pay

## 2013-07-12 ENCOUNTER — Ambulatory Visit (HOSPITAL_COMMUNITY)
Admission: RE | Admit: 2013-07-12 | Discharge: 2013-07-12 | Disposition: A | Discharge status: Home / Self Care | Payer: Medicare Other | Source: Ambulatory Visit | Attending: Physician Assistant | Admitting: Physician Assistant

## 2013-07-12 DIAGNOSIS — Z923 Personal history of irradiation: Secondary | ICD-10-CM | POA: Insufficient documentation

## 2013-07-12 DIAGNOSIS — C3490 Malignant neoplasm of unspecified part of unspecified bronchus or lung: Secondary | ICD-10-CM

## 2013-07-12 DIAGNOSIS — C3410 Malignant neoplasm of upper lobe, unspecified bronchus or lung: Secondary | ICD-10-CM

## 2013-07-12 DIAGNOSIS — Z9221 Personal history of antineoplastic chemotherapy: Secondary | ICD-10-CM | POA: Insufficient documentation

## 2013-07-12 LAB — CBC WITH DIFFERENTIAL/PLATELET
BASO%: 1.3 % (ref 0.0–2.0)
Basophils Absolute: 0.1 10*3/uL (ref 0.0–0.1)
EOS ABS: 0.1 10*3/uL (ref 0.0–0.5)
EOS%: 2.1 % (ref 0.0–7.0)
HEMATOCRIT: 31.7 % — AB (ref 34.8–46.6)
HGB: 10.7 g/dL — ABNORMAL LOW (ref 11.6–15.9)
LYMPH#: 1.1 10*3/uL (ref 0.9–3.3)
LYMPH%: 22.8 % (ref 14.0–49.7)
MCH: 34 pg (ref 25.1–34.0)
MCHC: 33.6 g/dL (ref 31.5–36.0)
MCV: 101.2 fL — ABNORMAL HIGH (ref 79.5–101.0)
MONO#: 0.5 10*3/uL (ref 0.1–0.9)
MONO%: 11.2 % (ref 0.0–14.0)
NEUT#: 3 10*3/uL (ref 1.5–6.5)
NEUT%: 62.6 % (ref 38.4–76.8)
Platelets: 181 10*3/uL (ref 145–400)
RBC: 3.13 10*6/uL — AB (ref 3.70–5.45)
RDW: 18.1 % — ABNORMAL HIGH (ref 11.2–14.5)
WBC: 4.7 10*3/uL (ref 3.9–10.3)

## 2013-07-12 LAB — COMPREHENSIVE METABOLIC PANEL (CC13)
ALK PHOS: 144 U/L (ref 40–150)
ALT: 25 U/L (ref 0–55)
AST: 16 U/L (ref 5–34)
Albumin: 3.6 g/dL (ref 3.5–5.0)
Anion Gap: 12 mEq/L — ABNORMAL HIGH (ref 3–11)
BILIRUBIN TOTAL: 0.82 mg/dL (ref 0.20–1.20)
BUN: 11.3 mg/dL (ref 7.0–26.0)
CO2: 25 mEq/L (ref 22–29)
Calcium: 9.4 mg/dL (ref 8.4–10.4)
Chloride: 106 mEq/L (ref 98–109)
Creatinine: 0.8 mg/dL (ref 0.6–1.1)
GLUCOSE: 94 mg/dL (ref 70–140)
Potassium: 4.4 mEq/L (ref 3.5–5.1)
Sodium: 143 mEq/L (ref 136–145)
Total Protein: 7.4 g/dL (ref 6.4–8.3)

## 2013-07-12 MED ORDER — IOHEXOL 300 MG/ML  SOLN
80.0000 mL | Freq: Once | INTRAMUSCULAR | Status: AC | PRN
Start: 2013-07-12 — End: 2013-07-12
  Administered 2013-07-12: 80 mL via INTRAVENOUS

## 2013-07-15 ENCOUNTER — Encounter: Payer: Self-pay | Admitting: Radiation Oncology

## 2013-07-15 ENCOUNTER — Ambulatory Visit
Admission: RE | Admit: 2013-07-15 | Discharge: 2013-07-15 | Disposition: A | Discharge status: Home / Self Care | Payer: Medicare Other | Source: Ambulatory Visit | Attending: Radiation Oncology | Admitting: Radiation Oncology

## 2013-07-15 VITALS — BP 136/67 | HR 101 | Temp 98.1°F | Ht 66.0 in | Wt 181.3 lb

## 2013-07-15 DIAGNOSIS — C3410 Malignant neoplasm of upper lobe, unspecified bronchus or lung: Secondary | ICD-10-CM

## 2013-07-15 NOTE — Progress Notes (Signed)
Radiation Oncology         [phone] ________________________________  Name: Dawn Mercer MRN: [id number]  Date: 07/15/2013  DOB: 1945-04-23  Follow-Up Visit Note  CC: [name], MD  Antony Blackbird, MD  Diagnosis:   Malignant neoplasm of upper lobe, bronchus or lung   Primary site: Lung (Right)   Staging method: AJCC 7th Edition   Clinical: Stage IIIA (T1b, N2, M0)    Summary: Stage IIIA (T1b, N2, M0)   Interval Since Last Radiation:  4 months  Narrative:  The patient returns today for routine follow-up.  She recently completed consolidation chemotherapy with carboplatin and paclitaxel after she received a partial response to her radiosensitizing chemotherapy and chest radiation.  Patient does have some fatigue at this time and some mild dyspnea with exertion but otherwise is doing well. She denies any new bony pain headaches dizziness or blurred vision.                              ALLERGIES:  is allergic to latex and codeine.  Meds: Current Outpatient Prescriptions  Medication Sig Dispense Refill  . albuterol (PROVENTIL HFA;VENTOLIN HFA) 108 (90 BASE) MCG/ACT inhaler Inhale 1 puff into the lungs every 6 (six) hours as needed for wheezing.      . Cholecalciferol (VITAMIN D-3) 5000 UNITS TABS Take 5,000 Units by mouth 2 (two) times a week.      . esomeprazole (NEXIUM) 40 MG capsule Take 40 mg by mouth daily before breakfast.      . Hydrocodone-Acetaminophen 5-300 MG TABS Take 5 mg by mouth 6 (six) times daily. As needed for pain  25 each  0  . levothyroxine (SYNTHROID, LEVOTHROID) 112 MCG tablet Take 112 mcg by mouth daily before breakfast.      . ondansetron (ZOFRAN) 4 MG tablet Take 1 tablet (4 mg total) by mouth every 6 (six) hours.  12 tablet  0  . oxyCODONE-acetaminophen (PERCOCET) 5-325 MG per tablet Take 2 tablets by mouth every 4 (four) hours as needed.  20 tablet  0  . potassium chloride SA (K-DUR,KLOR-CON) 20 MEQ tablet Take 1 tablet (20 mEq total) by  mouth 2 (two) times daily.  20 tablet  0  . ALPRAZolam (XANAX) 0.5 MG tablet Take 0.25 mg by mouth 2 (two) times daily as needed for anxiety.      . clidinium-chlordiazePOXIDE (LIBRAX) 5-2.5 MG per capsule Take 1 capsule by mouth daily as needed (for upset stomach).      . diphenoxylate-atropine (LOMOTIL) 2.5-0.025 MG per tablet Take 1 tablet by mouth every Sunday.      . gabapentin (NEURONTIN) 100 MG capsule Take 1 capsule (100 mg total) by mouth 3 (three) times daily.  90 capsule  1  . lidocaine-prilocaine (EMLA) cream Apply 1 application topically as needed. Apply to port 1 hour before chemo appt      . LORazepam (ATIVAN) 1 MG tablet One tab po 30 min before MRI  1 tablet  0  . prochlorperazine (COMPAZINE) 10 MG tablet Take 10 mg by mouth every 6 (six) hours as needed for nausea or vomiting.      . sucralfate (CARAFATE) 1 GM/10ML suspension Take 10 mLs (1 g total) by mouth 4 (four) times daily -  with meals and at bedtime.  420 mL  1  . temazepam (RESTORIL) 15 MG capsule Take 1 capsule (15 mg total) by mouth at bedtime as needed for sleep.  30 capsule  0  . traMADol (ULTRAM) 50 MG tablet Take 1 tablet (50 mg total) by mouth every 6 (six) hours as needed for moderate pain or severe pain.  50 tablet  0  . Wound Cleansers (RADIAPLEX EX) Apply 1 application topically 2 (two) times daily.       No current facility-administered medications for this encounter.    Physical Findings: The patient is in no acute distress. Patient is alert and oriented.  height is 5\' 6"  (1.676 m) and weight is 181 lb 4.8 oz (82.237 kg). Her oral temperature is 98.1 F (36.7 C). Her blood pressure is 136/67 and her pulse is 101. Her oxygen saturation is 92%. .   No palpable supraclavicular or axillary adenopathy. The lungs are clear to auscultation. The heart has a regular rhythm and rate.  Lab Findings: Lab Results  Component Value Date   WBC 4.7 07/12/2013   HGB 10.7* 07/12/2013   HCT 31.7* 07/12/2013   MCV 101.2*  07/12/2013   PLT 181 07/12/2013      Radiographic Findings: Ct Chest W Contrast  07/12/2013   CLINICAL DATA:  Lung cancer restaging. Chemotherapy and radiation therapy complete.  EXAM: CT CHEST WITH CONTRAST  TECHNIQUE: Multidetector CT imaging of the chest was performed during intravenous contrast administration.  CONTRAST:  16mL OMNIPAQUE IOHEXOL 300 MG/ML  SOLN  COMPARISON:  04/23/2013 and 11/17/2012.  PET 12/17/2012.  FINDINGS: Right IJ Port-A-Cath terminates in the high right atrium. Heart size normal. No pericardial effusion. Mediastinal lymph nodes are not enlarged by CT size criteria. No hilar or axillary adenopathy. Atherosclerotic calcification of the arterial vasculature, including involvement of the coronary arteries.  Biapical pleural parenchymal scarring. Moderate centrilobular emphysema. Spiculated nodule in the right upper lobe measures 8 x 12 mm, stable (image 26). Scattered mild peribronchovascular nodularity is unchanged. Mild mosaic attenuation in the lungs, as before. No pleural fluid. Airway is unremarkable.  Incidental imaging of the upper abdomen shows the visualized portion of the liver, gallbladder and right adrenal glands are unremarkable. Left adrenal nodule measures 1.2 x 1.3 cm and 30 Hounsfield units, unchanged from 11/17/2012 and not shown to be hypermetabolic on 62/94/7654. Visualized portions of the kidneys, spleen, pancreas, stomach and bowel are otherwise unremarkable. No upper abdominal adenopathy. No worrisome lytic or sclerotic lesions.  IMPRESSION: 1. Spiculated right upper lobe nodule is stable. 2. Coronary artery calcification. 3. Left adrenal adenoma.   Electronically Signed   By: Lorin Picket M.D.   On: 07/12/2013 15:01   US Abdomen Complete  07/05/2013   CLINICAL DATA:  RUQ pain  EXAM: ULTRASOUND ABDOMEN COMPLETE  COMPARISON:  Prior ultrasound from 11/14/2011.  FINDINGS: Gallbladder:  Layering echogenic material with shadowing present within the gallbladder  lumen, compatible with cholelithiasis. No sonographic Murphy sign noted. Gallbladder wall measured within normal limits at 1.4 mm.  Common bile duct:  Diameter: 7.5 mm, likely within normal limits for patient age.  Liver:  No focal lesion identified. Diffusely increased echogenicity seen within the hepatic parenchyma is suggestive of steatosis.  IVC:  No abnormality visualized.  Pancreas:  Not well visualized due to overlying bowel gas.  Spleen:  Size and appearance within normal limits.  Right Kidney:  Length: 10.5 cm. Echogenicity within normal limits. No mass or hydronephrosis visualized.  Left Kidney:  Length: 12.3 cm. The left kidney was not well visualized due to overlying bowel gas.  Abdominal aorta:  No aneurysm visualized.  Other findings:  None.  IMPRESSION: 1. Cholelithiasis without  sonographic evidence of acute cholecystitis. 2. Diffusely increased echogenicity within the liver, suggestive of steatosis.   Electronically Signed   By: Jeannine Boga M.D.   On: 07/05/2013 16:07    Impression:  Recent CT scans show normal sized right hilar and mediastinal adenopathy.  She continues to have a small spiculated mass in the right upper lobe which is decreased from 1.7 cm x 1.7 cm to 0.8 x 1.2 cm.   Plan:  Oncology followup next week. Options to consider would be PET scan to see if any residual activity in the right upper lobe lesion, close followup or surgical intervention.  Routine followup in 3 months. May need to consider re- presentation at the thoracic oncology conference  ____________________________________ Blair Promise, MD

## 2013-07-15 NOTE — Progress Notes (Signed)
Dawn Mercer here for follow up after treatment to her right upper lobe.  She denies pain and a sore throat today.  She reports her fatigue.  She reports a fequent cough with yellow sputum.  She denies hemotypsis.  Her weight is stable.  She reports shortness of breath with activity.  Her oxygen saturation today is 92% on room air.  She reports that she has to have her gallbladder out.  She meets with the surgeon on 5/29.  She took 2 tablets of percocet with her gallbladder pain.  She takes norco at night as needed to help her sleep.  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  She stopped taking gabapentin because she thought it was causing the gallbladder pain.  The skin on her right back is intact with hyperpigmentation.

## 2013-07-20 ENCOUNTER — Encounter: Payer: Self-pay | Admitting: Internal Medicine

## 2013-07-20 ENCOUNTER — Ambulatory Visit (INDEPENDENT_AMBULATORY_CARE_PROVIDER_SITE_OTHER): Payer: Medicare Other | Admitting: Surgery

## 2013-07-20 ENCOUNTER — Telehealth: Payer: Self-pay | Admitting: Internal Medicine

## 2013-07-20 ENCOUNTER — Ambulatory Visit (HOSPITAL_BASED_OUTPATIENT_CLINIC_OR_DEPARTMENT_OTHER): Payer: Medicare Other | Admitting: Internal Medicine

## 2013-07-20 ENCOUNTER — Telehealth: Payer: Self-pay | Admitting: *Deleted

## 2013-07-20 ENCOUNTER — Other Ambulatory Visit: Payer: Medicare Other

## 2013-07-20 VITALS — BP 130/60 | HR 79 | Temp 97.6°F | Resp 18 | Ht 66.0 in | Wt 177.8 lb

## 2013-07-20 DIAGNOSIS — G609 Hereditary and idiopathic neuropathy, unspecified: Secondary | ICD-10-CM

## 2013-07-20 DIAGNOSIS — C3410 Malignant neoplasm of upper lobe, unspecified bronchus or lung: Secondary | ICD-10-CM

## 2013-07-20 NOTE — Telephone Encounter (Signed)
Called left vm message for appt with Dr. Roxan Hockey June 2nd at 2:15

## 2013-07-20 NOTE — Progress Notes (Signed)
[place] [phone]   [phone]  OFFICE PROGRESS NOTE  [name], MD [phone] [address] [address]  DIAGNOSIS: Stage IIIA (T1b., N2, M0) non-small cell lung cancer, squamous cell carcinoma presented with right upper lobe lung nodule in addition to mediastinal lymphadenopathy diagnosed in November of 2014.  PRIOR THERAPY:  1) Concurrent chemoradiation with weekly carboplatin for AUC of 2 and paclitaxel 45 mg/M2, status post 8 cycles, last dose was given 03/15/2013 with partial response. 2) Consolidation chemotherapy with carboplatin for AUC of 5 and paclitaxel 175 mg/M2 every 3 weeks with Neulasta support, status post 3 cycles. First dose 05/03/2013.   CURRENT THERAPY: Observation.  CHEMOTHERAPY INTENT: Control/curative  CURRENT # OF CHEMOTHERAPY CYCLES: 0  CURRENT ANTIEMETICS: Zofran, dexamethasone and Compazine  CURRENT SMOKING STATUS: Former smoker.  ORAL CHEMOTHERAPY AND CONSENT: None  CURRENT BISPHOSPHONATES USE: None  PAIN MANAGEMENT: 0/10  NARCOTICS INDUCED CONSTIPATION: None  LIVING WILL AND CODE STATUS: Full code   INTERVAL HISTORY: Dawn Mercer 68 y.o. female returns to the clinic today for followup visit accompanied by her husband. The patient is feeling fine today with no specific complaints. She was recently found to have gallbladder stones after few episodes of right upper quadrant abdominal pain. She is referred to Dr. Ninfa Linden and expected to have cholecystectomy in the next few weeks. 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 She denied having any significant chest pain, shortness of breath, cough or hemoptysis. She denied having any nausea or vomiting. She has no significant weight loss or night sweats. She had repeat CT scan of the chest performed recently  and she is here for evaluation and discussion of her scan results.  MEDICAL HISTORY: Past Medical History  Diagnosis Date  . 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 Status post radiation therapy 10/1999  . GERD (gastroesophageal reflux disease)   . Hypothyroidism   . IBS (irritable bowel syndrome)     Hx: of  . COPD (chronic obstructive pulmonary disease)   . Shortness of breath     Hx: of with exertion  . Bronchitis     Hx: of  . Hay fever     Hx: of  . Seizures     Hx: of over 40 years ago  . H/O hiatal hernia   . History of radiation therapy 01/28/13-03/23/13    63 gray to right upper lobe  . 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 Lung cancer, upper lobe dx'd 12/2012    chemo and XRt complete    ALLERGIES:  is allergic to latex and codeine.  MEDICATIONS:  Current Outpatient Prescriptions  Medication Sig Dispense Refill  . albuterol (PROVENTIL HFA;VENTOLIN HFA) 108 (90 BASE) MCG/ACT inhaler Inhale 1 puff into the lungs every 6 (six) hours as needed for wheezing.      Marland Kitchen ALPRAZolam (XANAX) 0.5 MG tablet Take 0.25 mg by mouth 2 (two) times daily as needed for anxiety.      . Cholecalciferol (VITAMIN D-3) 5000 UNITS TABS Take 5,000 Units by mouth 2 (two) times a week.      . clidinium-chlordiazePOXIDE (LIBRAX) 5-2.5 MG per capsule Take 1 capsule by mouth daily as needed (for upset stomach).      . diphenoxylate-atropine (LOMOTIL) 2.5-0.025 MG per tablet Take 1 tablet by  mouth every Sunday.      . esomeprazole (NEXIUM) 40 MG capsule Take 40 mg by mouth daily before breakfast.      . gabapentin (NEURONTIN) 100 MG capsule Take 1 capsule (100 mg total) by mouth 3 (three) times daily.  90 capsule  1  . Hydrocodone-Acetaminophen 5-300 MG TABS Take 5 mg by mouth 6 (six) times daily. As needed for pain  25 each  0  . levothyroxine (SYNTHROID, LEVOTHROID) 112 MCG tablet Take 112 mcg by mouth daily before breakfast.      . lidocaine-prilocaine  (EMLA) cream Apply 1 application topically as needed. Apply to port 1 hour before chemo appt      . LORazepam (ATIVAN) 1 MG tablet One tab po 30 min before MRI  1 tablet  0  . ondansetron (ZOFRAN) 4 MG tablet Take 1 tablet (4 mg total) by mouth every 6 (six) hours.  12 tablet  0  . oxyCODONE-acetaminophen (PERCOCET) 5-325 MG per tablet Take 2 tablets by mouth every 4 (four) hours as needed.  20 tablet  0  . potassium chloride SA (K-DUR,KLOR-CON) 20 MEQ tablet Take 1 tablet (20 mEq total) by mouth 2 (two) times daily.  20 tablet  0  . prochlorperazine (COMPAZINE) 10 MG tablet Take 10 mg by mouth every 6 (six) hours as needed for nausea or vomiting.      . sucralfate (CARAFATE) 1 GM/10ML suspension Take 10 mLs (1 g total) by mouth 4 (four) times daily -  with meals and at bedtime.  420 mL  1  . temazepam (RESTORIL) 15 MG capsule Take 1 capsule (15 mg total) by mouth at bedtime as needed for sleep.  30 capsule  0  . traMADol (ULTRAM) 50 MG tablet Take 1 tablet (50 mg total) by mouth every 6 (six) hours as needed for moderate pain or severe pain.  50 tablet  0  . Wound Cleansers (RADIAPLEX EX) Apply 1 application topically 2 (two) times daily.       No current facility-administered medications for this visit.    SURGICAL HISTORY:  Past Surgical History  Procedure Laterality Date  . Laser ablation  04/2004    for VIN III  . Colonoscopy w/ biopsies and polypectomy      Hx: of  . Tubal ligation    . Video bronchoscopy with endobronchial navigation N/A 12/31/2012    Procedure: VIDEO BRONCHOSCOPY WITH ENDOBRONCHIAL NAVIGATION;  Surgeon: Melrose Nakayama, MD;  Location: [facility];  Service: Thoracic;  Laterality: N/A;  . Video bronchoscopy with endobronchial ultrasound N/A 12/31/2012    Procedure: VIDEO BRONCHOSCOPY WITH ENDOBRONCHIAL ULTRASOUND;  Surgeon: Melrose Nakayama, MD;  Location: [facility];  Service: Thoracic;  Laterality: N/A;    REVIEW OF SYSTEMS:  Constitutional: negative Eyes:  negative Ears, nose, mouth, throat, and face: negative Respiratory: negative Cardiovascular: negative Gastrointestinal: negative Genitourinary:negative Integument/breast: negative Hematologic/lymphatic: negative Musculoskeletal:negative Neurological: negative Behavioral/Psych: negative Endocrine: negative Allergic/Immunologic: negative   PHYSICAL EXAMINATION: General appearance: alert, cooperative and no distress Head: Normocephalic, without obvious abnormality, atraumatic Neck: no adenopathy, no JVD, supple, symmetrical, trachea midline and thyroid not enlarged, symmetric, no tenderness/mass/nodules Lymph nodes: Cervical, supraclavicular, and axillary nodes normal. Resp: clear to auscultation bilaterally Back: symmetric, no curvature. ROM normal. No CVA tenderness. Cardio: regular rate and rhythm, S1, S2 normal, no murmur, click, rub or gallop GI: soft, non-tender; bowel sounds normal; no masses,  no organomegaly Extremities: extremities normal, atraumatic, no cyanosis or edema Neurologic: Alert and oriented X 3, normal  strength and tone. Normal symmetric reflexes. Normal coordination and gait  ECOG PERFORMANCE STATUS: 1 - Symptomatic but completely ambulatory  Blood pressure 130/60, pulse 79, temperature 97.6 F (36.4 C), temperature source Oral, resp. rate 18, height 5\' 6"  (1.676 m), weight 177 lb 12.8 oz (80.65 kg).  LABORATORY DATA: Lab Results  Component Value Date   WBC 4.7 07/12/2013   HGB 10.7* 07/12/2013   HCT 31.7* 07/12/2013   MCV 101.2* 07/12/2013   PLT 181 07/12/2013      Chemistry      Component Value Date/Time   NA 143 07/12/2013 1036   NA 138 12/30/2012 1335   K 4.4 07/12/2013 1036   K 4.1 12/30/2012 1335   CL 101 12/30/2012 1335   CO2 25 07/12/2013 1036   CO2 28 12/30/2012 1335   BUN 11.3 07/12/2013 1036   BUN 8 12/30/2012 1335   CREATININE 0.8 07/12/2013 1036   CREATININE 0.86 12/30/2012 1335      Component Value Date/Time   CALCIUM 9.4 07/12/2013 1036    CALCIUM 9.2 12/30/2012 1335   ALKPHOS 144 07/12/2013 1036   ALKPHOS 89 12/30/2012 1335   AST 16 07/12/2013 1036   AST 20 12/30/2012 1335   ALT 25 07/12/2013 1036   ALT 17 12/30/2012 1335   BILITOT 0.82 07/12/2013 1036   BILITOT 0.7 12/30/2012 1335       RADIOGRAPHIC STUDIES: Ct Chest W Contrast  07/12/2013   CLINICAL DATA:  Lung cancer restaging. Chemotherapy and radiation therapy complete.  EXAM: CT CHEST WITH CONTRAST  TECHNIQUE: Multidetector CT imaging of the chest was performed during intravenous contrast administration.  CONTRAST:  82mL OMNIPAQUE IOHEXOL 300 MG/ML  SOLN  COMPARISON:  04/23/2013 and 11/17/2012.  PET 12/17/2012.  FINDINGS: Right IJ Port-A-Cath terminates in the high right atrium. Heart size normal. No pericardial effusion. Mediastinal lymph nodes are not enlarged by CT size criteria. No hilar or axillary adenopathy. Atherosclerotic calcification of the arterial vasculature, including involvement of the coronary arteries.  Biapical pleural parenchymal scarring. Moderate centrilobular emphysema. Spiculated nodule in the right upper lobe measures 8 x 12 mm, stable (image 26). Scattered mild peribronchovascular nodularity is unchanged. Mild mosaic attenuation in the lungs, as before. No pleural fluid. Airway is unremarkable.  Incidental imaging of the upper abdomen shows the visualized portion of the liver, gallbladder and right adrenal glands are unremarkable. Left adrenal nodule measures 1.2 x 1.3 cm and 30 Hounsfield units, unchanged from 11/17/2012 and not shown to be hypermetabolic on 96/22/2979. Visualized portions of the kidneys, spleen, pancreas, stomach and bowel are otherwise unremarkable. No upper abdominal adenopathy. No worrisome lytic or sclerotic lesions.  IMPRESSION: 1. Spiculated right upper lobe nodule is stable. 2. Coronary artery calcification. 3. Left adrenal adenoma.   Electronically Signed   By: Lorin Picket M.D.   On: 07/12/2013 15:01   US Abdomen  Complete  07/05/2013   CLINICAL DATA:  RUQ pain  EXAM: ULTRASOUND ABDOMEN COMPLETE  COMPARISON:  Prior ultrasound from 11/14/2011.  FINDINGS: Gallbladder:  Layering echogenic material with shadowing present within the gallbladder lumen, compatible with cholelithiasis. No sonographic Murphy sign noted. Gallbladder wall measured within normal limits at 1.4 mm.  Common bile duct:  Diameter: 7.5 mm, likely within normal limits for patient age.  Liver:  No focal lesion identified. Diffusely increased echogenicity seen within the hepatic parenchyma is suggestive of steatosis.  IVC:  No abnormality visualized.  Pancreas:  Not well visualized due to overlying bowel gas.  Spleen:  Size and  appearance within normal limits.  Right Kidney:  Length: 10.5 cm. Echogenicity within normal limits. No mass or hydronephrosis visualized.  Left Kidney:  Length: 12.3 cm. The left kidney was not well visualized due to overlying bowel gas.  Abdominal aorta:  No aneurysm visualized.  Other findings:  None.  IMPRESSION: 1. Cholelithiasis without sonographic evidence of acute cholecystitis. 2. Diffusely increased echogenicity within the liver, suggestive of steatosis.   Electronically Signed   By: Jeannine Boga M.D.   On: 07/05/2013 16:07   ASSESSMENT AND PLAN: This is a very pleasant 68 years old white female recently diagnosed with a stage IIIA non-small cell lung cancer currently undergoing concurrent chemoradiation with weekly carboplatin and paclitaxel status post 8 weekly treatment followed by consolidation chemotherapy with carboplatin and paclitaxel status post 3 cycles. She tolerated her last cycle of chemotherapy fairly well with no significant adverse effects. Her recent CT scan of the chest showed stable disease within residual Smyth a spiculated right upper lobe lung nodule but no evidence for hilar or mediastinal lymphadenopathy. I discussed the scan results with the patient and her husband.  I recommended for her to  see Dr. Roxan Hockey for evaluation and discussion of surgical resection. I would see the patient back for followup visit in 3 months after repeating CT scan of the chest. She was advised to call immediately if she has any concerning symptoms in the interval.  The patient voices understanding of current disease status and treatment options and is in agreement with the current care plan.  All questions were answered. The patient knows to call the clinic with any problems, questions or concerns. We can certainly see the patient much sooner if necessary.  Disclaimer: This note was dictated with voice recognition software. Similar sounding words can inadvertently be transcribed and may not be corrected upon review.

## 2013-07-20 NOTE — Telephone Encounter (Signed)
[facility]v pt appt schedule for june thru sept. [facility] will [facility] pt w/appt for ct. pt aware. per pt port was last flushed @ ED @ [facility] (5/11). furture port flush appts scheduled accordingly.

## 2013-07-23 ENCOUNTER — Encounter (INDEPENDENT_AMBULATORY_CARE_PROVIDER_SITE_OTHER): Payer: Self-pay | Admitting: Surgery

## 2013-07-23 ENCOUNTER — Ambulatory Visit (INDEPENDENT_AMBULATORY_CARE_PROVIDER_SITE_OTHER): Payer: Medicare Other | Admitting: Surgery

## 2013-07-23 VITALS — BP 122/80 | HR 86 | Temp 97.6°F | Ht 68.0 in | Wt 177.0 lb

## 2013-07-23 DIAGNOSIS — K8020 Calculus of gallbladder without cholecystitis without obstruction: Secondary | ICD-10-CM

## 2013-07-23 NOTE — Progress Notes (Signed)
Patient ID: Dawn Mercer, female   DOB: 01/26/1946, 68 y.o.   MRN: [id number]  Chief Complaint  Patient presents with  . Cholelithiasis    HPI Dawn Mercer is a 68 y.o. female.   HPI This is an pleasant female referred by Dr. Harrington Challenger for evaluation of symptomatic cholelithiasis. Approximately 3 weeks ago she started having attacks of right upper quadrant and epigastric abdominal pain her he did hurt her back. She has had nausea but no emesis.  This has occurred after fatty meals. Today she is currently pain-free. She has undergone radiation therapy as finished chemotherapy for lung cancer.  She is being considered for surgery on the lung cancer. Bowel movements are normal Past Medical History  Diagnosis Date  . 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 Status post radiation therapy 10/1999  . GERD (gastroesophageal reflux disease)   . Hypothyroidism   . IBS (irritable bowel syndrome)     Hx: of  . COPD (chronic obstructive pulmonary disease)   . Shortness of breath     Hx: of with exertion  . Bronchitis     Hx: of  . Hay fever     Hx: of  . Seizures     Hx: of over 40 years ago  . H/O hiatal hernia   . History of radiation therapy 01/28/13-03/23/13    63 gray to right upper lobe  . 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 Lung cancer, upper lobe dx'd 12/2012    chemo and XRt complete    Past Surgical History  Procedure Laterality Date  . Laser ablation  04/2004    for VIN III  . Colonoscopy w/ biopsies and polypectomy      Hx: of  . Tubal ligation    . Video bronchoscopy with endobronchial navigation N/A 12/31/2012    Procedure: VIDEO BRONCHOSCOPY WITH ENDOBRONCHIAL NAVIGATION;  Surgeon: Melrose Nakayama, MD;  Location: [facility];  Service: Thoracic;  Laterality: N/A;  . Video bronchoscopy with endobronchial ultrasound N/A 12/31/2012    Procedure: VIDEO BRONCHOSCOPY WITH ENDOBRONCHIAL ULTRASOUND;  Surgeon: Melrose Nakayama, MD;   Location: [facility];  Service: Thoracic;  Laterality: N/A;    Family History  Problem Relation Age of Onset  . Heart disease Father   . Cervical cancer Sister   . Lung cancer Brother   . Heart disease Brother   . Cancer Brother     small cell lung cancer    Social History History  Substance Use Topics  . Smoking status: Former Smoker -- 1.00 packs/day for 34 years    Start date: 02/25/1998    Quit date: 02/25/1998  . Smokeless tobacco: Never Used  . Alcohol Use: No    Allergies  Allergen Reactions  . Latex Dermatitis  . Codeine Nausea Only    Current Outpatient Prescriptions  Medication Sig Dispense Refill  . albuterol (PROVENTIL HFA;VENTOLIN HFA) 108 (90 BASE) MCG/ACT inhaler Inhale 1 puff into the lungs every 6 (six) hours as needed for wheezing.      Marland Kitchen ALPRAZolam (XANAX) 0.5 MG tablet Take 0.25 mg by mouth 2 (two) times daily as needed for anxiety.      . Cholecalciferol (VITAMIN D-3) 5000 UNITS TABS Take 5,000 Units by mouth 2 (two) times a week.      . clidinium-chlordiazePOXIDE (LIBRAX) 5-2.5 MG per capsule Take 1 capsule by mouth daily as needed (for upset stomach).      . diphenoxylate-atropine (  LOMOTIL) 2.5-0.025 MG per tablet Take 1 tablet by mouth every Sunday.      . esomeprazole (NEXIUM) 40 MG capsule Take 40 mg by mouth daily before breakfast.      . gabapentin (NEURONTIN) 100 MG capsule Take 1 capsule (100 mg total) by mouth 3 (three) times daily.  90 capsule  1  . Hydrocodone-Acetaminophen 5-300 MG TABS Take 5 mg by mouth 6 (six) times daily. As needed for pain  25 each  0  . levothyroxine (SYNTHROID, LEVOTHROID) 112 MCG tablet Take 112 mcg by mouth daily before breakfast.      . lidocaine-prilocaine (EMLA) cream Apply 1 application topically as needed. Apply to port 1 hour before chemo appt      . LORazepam (ATIVAN) 1 MG tablet One tab po 30 min before MRI  1 tablet  0  . ondansetron (ZOFRAN) 4 MG tablet Take 1 tablet (4 mg total) by mouth every 6 (six) hours.   12 tablet  0  . oxyCODONE-acetaminophen (PERCOCET) 5-325 MG per tablet Take 2 tablets by mouth every 4 (four) hours as needed.  20 tablet  0  . prochlorperazine (COMPAZINE) 10 MG tablet Take 10 mg by mouth every 6 (six) hours as needed for nausea or vomiting.      . sucralfate (CARAFATE) 1 GM/10ML suspension Take 10 mLs (1 g total) by mouth 4 (four) times daily -  with meals and at bedtime.  420 mL  1  . temazepam (RESTORIL) 15 MG capsule Take 1 capsule (15 mg total) by mouth at bedtime as needed for sleep.  30 capsule  0   No current facility-administered medications for this visit.    Review of Systems Review of Systems  Constitutional: Negative for fever, chills and unexpected weight change.  HENT: Negative for congestion, hearing loss, sore throat, trouble swallowing and voice change.   Eyes: Negative for visual disturbance.  Respiratory: Positive for cough. Negative for wheezing.   Cardiovascular: Negative for chest pain, palpitations and leg swelling.  Gastrointestinal: Positive for nausea and abdominal pain. Negative for vomiting, diarrhea, constipation, blood in stool, abdominal distention and anal bleeding.  Genitourinary: Negative for hematuria, vaginal bleeding and difficulty urinating.  Musculoskeletal: Negative for arthralgias.  Skin: Negative for rash and wound.  Neurological: Negative for seizures, syncope and headaches.  Hematological: Negative for adenopathy. Does not bruise/bleed easily.  Psychiatric/Behavioral: Negative for confusion.    Blood pressure 122/80, pulse 86, temperature 97.6 F (36.4 C), height 5\' 8"  (1.727 m), weight 177 lb (80.287 kg).  Physical Exam Physical Exam  Constitutional: She is oriented to person, place, and time. She appears well-developed and well-nourished. No distress.  HENT:  Head: Normocephalic and atraumatic.  Right Ear: External ear normal.  Left Ear: External ear normal.  Nose: Nose normal.  Mouth/Throat: Oropharynx is clear and  moist.  Eyes: Conjunctivae are normal. Right eye exhibits no discharge. Left eye exhibits no discharge. No scleral icterus.  Neck: Normal range of motion. Neck supple. No tracheal deviation present. No thyromegaly present.  Cardiovascular: Normal rate, regular rhythm, normal heart sounds and intact distal pulses.   No murmur heard. Pulmonary/Chest: Effort normal and breath sounds normal.  Abdominal: Soft. Bowel sounds are normal. There is tenderness. There is guarding.  Mild tenderness with guarding in the right upper quadrant  Musculoskeletal: Normal range of motion. She exhibits no edema and no tenderness.  Neurological: She is alert and oriented to person, place, and time.  Skin: Skin is warm and dry. No  rash noted. No erythema.  Psychiatric: Her behavior is normal.    Data Reviewed I have reviewed her ultrasound demonstrating cholelithiasis. The bile duct is normal. Liver function tests are normal  Assessment    Symptomatic cholelithiasis     Plan    I discussed the diagnosis with her in detail.  I think we should go ahead and proceed with laparoscopic cholecystectomy given her eventual potential need for thoracotomy. I think she is quite symptomatic from gallstones. I discussed the procedure in detail.  The patient was given Neurosurgeon.  We discussed the risks and benefits of a laparoscopic cholecystectomy and possible cholangiogram including, but not limited to bleeding, infection, injury to surrounding structures such as the intestine or liver, bile leak, retained gallstones, need to convert to an open procedure, prolonged diarrhea, blood clots such as  DVT, common bile duct injury, anesthesia risks, and possible need for additional procedures.  The likelihood of improvement in symptoms and return to the patient's normal status is good. We discussed the typical post-operative recovery course.        Harl Bowie 07/23/2013, 9:09 AM

## 2013-07-26 ENCOUNTER — Encounter (HOSPITAL_COMMUNITY): Payer: Self-pay | Admitting: Pharmacy Technician

## 2013-07-27 ENCOUNTER — Encounter: Payer: Self-pay | Admitting: Thoracic Surgery (Cardiothoracic Vascular Surgery)

## 2013-07-27 ENCOUNTER — Ambulatory Visit (INDEPENDENT_AMBULATORY_CARE_PROVIDER_SITE_OTHER): Payer: Medicare Other | Admitting: Thoracic Surgery (Cardiothoracic Vascular Surgery)

## 2013-07-27 VITALS — BP 135/85 | HR 95 | Resp 20 | Ht 68.0 in | Wt 177.0 lb

## 2013-07-27 DIAGNOSIS — C3410 Malignant neoplasm of upper lobe, unspecified bronchus or lung: Secondary | ICD-10-CM

## 2013-07-27 NOTE — Progress Notes (Signed)
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   She was first diagnosed in November of 2014 with T1N2 stage IIIA squamous cell carcinoma. She underwent chemoradiation with 8 cycles carboplatin and paclitaxel. She received 63 Gy of radiation. Treatment was completed in late January. She then underwent consolidation chemotherapy for another 3 cycles. A recent CT showed a partial response. She tolerated treatment well. She did lose about 20 pounds with the initial treatment, but did not have any weight loss with the second round of chemo. She also had some radiation burn to her back and peripheral neuropathy.  She was recently having right upper quadrant pain and is scheduled to have a cholecystectomy by Dr. Ninfa Linden on June 9th. She has shortness of breath with exertion which is unchanged from prior to treatment. She currently has a "cold" with congestion and coughing. She has not had hemoptysis.  Past Medical History  Diagnosis Date  . 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 Status post radiation therapy 10/1999  . GERD (gastroesophageal reflux disease)   . Hypothyroidism   . IBS (irritable bowel syndrome)     Hx: of  . COPD (chronic obstructive pulmonary disease)   . Shortness of breath     Hx: of with exertion  . Bronchitis     Hx: of  . Hay fever     Hx: of  . Seizures     Hx: of over 40 years ago  . H/O hiatal hernia   . History of radiation therapy 01/28/13-03/23/13    63 gray to right upper lobe  . 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 Lung cancer, upper lobe dx'd 12/2012    chemo and XRt complete    Past Surgical History  Procedure Laterality Date  . Laser ablation  04/2004    for VIN III  . Colonoscopy w/ biopsies and polypectomy      Hx: of  . Tubal ligation    . Video bronchoscopy with endobronchial navigation N/A 12/31/2012    Procedure:  VIDEO BRONCHOSCOPY WITH ENDOBRONCHIAL NAVIGATION;  Surgeon: Melrose Nakayama, MD;  Location: [facility];  Service: Thoracic;  Laterality: N/A;  . Video bronchoscopy with endobronchial ultrasound N/A 12/31/2012    Procedure: VIDEO BRONCHOSCOPY WITH ENDOBRONCHIAL ULTRASOUND;  Surgeon: Melrose Nakayama, MD;  Location: [facility];  Service: Thoracic;  Laterality: N/A;   SOCIAL HISTORY  Married lives with husband 34-pack-year history of smoking quit in 2000  Current Outpatient Prescriptions  Medication Sig Dispense Refill  . albuterol (PROVENTIL HFA;VENTOLIN HFA) 108 (90 BASE) MCG/ACT inhaler Inhale 1 puff into the lungs every 6 (six) hours as needed for wheezing.      Marland Kitchen ALPRAZolam (XANAX) 0.5 MG tablet Take 0.25 mg by mouth 2 (two) times daily as needed for anxiety.      . Cholecalciferol (VITAMIN D-3) 5000 UNITS TABS Take 5,000 Units by mouth 2 (two) times a week.      . clidinium-chlordiazePOXIDE (LIBRAX) 5-2.5 MG per capsule Take 1 capsule by mouth daily as needed (for upset stomach).      . diphenoxylate-atropine (LOMOTIL) 2.5-0.025 MG per tablet Take 1 tablet by mouth every Sunday.      . esomeprazole (NEXIUM) 40 MG capsule Take 40 mg by mouth daily before breakfast.      . gabapentin (NEURONTIN) 100 MG capsule Take 1 capsule (100 mg total) by mouth  3 (three) times daily.  90 capsule  1  . Hydrocodone-Acetaminophen 5-300 MG TABS Take 1 tablet by mouth every 4 (four) hours as needed (for pain).      Marland Kitchen levothyroxine (SYNTHROID, LEVOTHROID) 112 MCG tablet Take 112 mcg by mouth daily before breakfast.      . LORazepam (ATIVAN) 1 MG tablet Take 1 mg by mouth once. One tab po 30 min before MRI      . ondansetron (ZOFRAN) 4 MG tablet Take 4 mg by mouth every 6 (six) hours as needed for nausea.      Marland Kitchen oxyCODONE-acetaminophen (PERCOCET) 5-325 MG per tablet Take 2 tablets by mouth every 4 (four) hours as needed.  20 tablet  0  . prochlorperazine (COMPAZINE) 10 MG tablet Take 10 mg by mouth every 6 (six)  hours as needed for nausea or vomiting.      . sucralfate (CARAFATE) 1 GM/10ML suspension Take 1 g by mouth 4 (four) times daily -  with meals and at bedtime.      . temazepam (RESTORIL) 15 MG capsule Take 1 capsule (15 mg total) by mouth at bedtime as needed for sleep.  30 capsule  0   No current facility-administered medications for this visit.    Physical Exam BP 135/85  Pulse 95  Resp 20  Ht 5\' 8"  (1.727 m)  Wt 177 lb (80.287 kg)  BMI 26.92 kg/m2  SpO2 % Well-developed well-nourished 68 year old woman in no acute distress Alert and oriented x3 with no focal motor deficits HEENT unremarkable No cervical or supraclavicular adenopathy Lungs clear with equal breath sounds bilaterally Cardiac regular rate and rhythm normal S1 and S2 Abdomen soft nontender Extremities without clubbing cyanosis or edema  Diagnostic Tests: CT CHEST WITH CONTRAST  TECHNIQUE:  Multidetector CT imaging of the chest was performed during  intravenous contrast administration.  CONTRAST: 37mL OMNIPAQUE IOHEXOL 300 MG/ML SOLN  COMPARISON: 04/23/2013 and 11/17/2012. PET 12/17/2012.  FINDINGS:  Right IJ Port-A-Cath terminates in the high right atrium. Heart size  normal. No pericardial effusion. Mediastinal lymph nodes are not  enlarged by CT size criteria. No hilar or axillary adenopathy.  Atherosclerotic calcification of the arterial vasculature, including  involvement of the coronary arteries.  Biapical pleural parenchymal scarring. Moderate centrilobular  emphysema. Spiculated nodule in the right upper lobe measures 8 x 12  mm, stable (image 26). Scattered mild peribronchovascular nodularity  is unchanged. Mild mosaic attenuation in the lungs, as before. No  pleural fluid. Airway is unremarkable.  Incidental imaging of the upper abdomen shows the visualized portion  of the liver, gallbladder and right adrenal glands are unremarkable.  Left adrenal nodule measures 1.2 x 1.3 cm and 30 Hounsfield  units,  unchanged from 11/17/2012 and not shown to be hypermetabolic on  84/16/6063. Visualized portions of the kidneys, spleen, pancreas,  stomach and bowel are otherwise unremarkable. No upper abdominal  adenopathy. No worrisome lytic or sclerotic lesions.  IMPRESSION:  1. Spiculated right upper lobe nodule is stable.  2. Coronary artery calcification.  3. Left adrenal adenoma.  Electronically Signed  By: Lorin Picket M.D.  On: 07/12/2013 15:01    Impression: 67 year old woman with stage IIIa squamous cell carcinoma who is status post combined chemoradiation, followed by consolidative chemotherapy. She now is approximately 6 months out from her original diagnosis and treatment. She completed her first round of chemotherapy and radiation in January.  She has had a partial response to treatment. The right upper lobe primary has decreased in size. Her  mediastinal node is roughly the same size that does appear to possibly be necrotic in the center of the node. She has not had a repeat brain MR since November.  It really is unclear whether surgery would be beneficial at this interval. It may not be feasible to do a reasonable operation this far out from a full dose of radiation therapy.  I had a long discussion with Mrs. Veasey regarding the role of surgery in the treatment of stage IIIa lung cancer. Certainly she has potential for benefit, but there is no guarantee.  She is scheduled to have her gallbladder out next week. I suspect it'll be at least a month after that procedure before she would be ready to undergo a lung resection.  She has never had pulmonary function testing. I will go ahead and order that is try and get them done before her cholecystectomy.  I also will order a repeat brain MR  Plan: Pulmonary function testing  MR brain  I will see her back in 3-4 weeks after she has recovered from her cholecystectomy.

## 2013-07-28 ENCOUNTER — Telehealth: Payer: Self-pay | Admitting: *Deleted

## 2013-07-28 ENCOUNTER — Other Ambulatory Visit: Payer: Self-pay | Admitting: *Deleted

## 2013-07-28 DIAGNOSIS — C3490 Malignant neoplasm of unspecified part of unspecified bronchus or lung: Secondary | ICD-10-CM

## 2013-07-28 NOTE — Telephone Encounter (Signed)
Received vm message regarding appt with Dr. Roxan Hockey.  I called back and left vm message to call me.

## 2013-07-29 ENCOUNTER — Telehealth: Payer: Self-pay | Admitting: *Deleted

## 2013-07-29 NOTE — Telephone Encounter (Signed)
Called pt.  She has some concerns about surgery.  I listened as she explained.  She would like to be seen by another surgeon. I told pt I would call thoracic surgery office and let them know.

## 2013-07-30 ENCOUNTER — Encounter (HOSPITAL_COMMUNITY)
Admission: RE | Admit: 2013-07-30 | Discharge: 2013-07-30 | Disposition: A | Discharge status: Home / Self Care | Payer: Medicare Other | Source: Ambulatory Visit | Attending: Surgery | Admitting: Surgery

## 2013-07-30 ENCOUNTER — Encounter (HOSPITAL_COMMUNITY): Payer: Self-pay

## 2013-07-30 ENCOUNTER — Encounter (HOSPITAL_COMMUNITY)
Admission: RE | Admit: 2013-07-30 | Discharge: 2013-07-30 | Disposition: A | Discharge status: Home / Self Care | Payer: Medicare Other | Source: Ambulatory Visit | Attending: Anesthesiology | Admitting: Anesthesiology

## 2013-07-30 DIAGNOSIS — Z01812 Encounter for preprocedural laboratory examination: Secondary | ICD-10-CM | POA: Insufficient documentation

## 2013-07-30 DIAGNOSIS — Z01818 Encounter for other preprocedural examination: Secondary | ICD-10-CM | POA: Insufficient documentation

## 2013-07-30 DIAGNOSIS — J438 Other emphysema: Secondary | ICD-10-CM | POA: Insufficient documentation

## 2013-07-30 DIAGNOSIS — Z85118 Personal history of other malignant neoplasm of bronchus and lung: Secondary | ICD-10-CM | POA: Insufficient documentation

## 2013-07-30 DIAGNOSIS — R0602 Shortness of breath: Secondary | ICD-10-CM | POA: Insufficient documentation

## 2013-07-30 HISTORY — DX: Anxiety disorder, unspecified: F41.9

## 2013-07-30 LAB — CBC
HCT: 34.8 % — ABNORMAL LOW (ref 36.0–46.0)
HEMOGLOBIN: 11.3 g/dL — AB (ref 12.0–15.0)
MCH: 32.8 pg (ref 26.0–34.0)
MCHC: 32.5 g/dL (ref 30.0–36.0)
MCV: 101.2 fL — ABNORMAL HIGH (ref 78.0–100.0)
Platelets: 171 10*3/uL (ref 150–400)
RBC: 3.44 MIL/uL — ABNORMAL LOW (ref 3.87–5.11)
RDW: 16.1 % — ABNORMAL HIGH (ref 11.5–15.5)
WBC: 5.8 10*3/uL (ref 4.0–10.5)

## 2013-07-30 LAB — BASIC METABOLIC PANEL
BUN: 9 mg/dL (ref 6–23)
CO2: 29 mEq/L (ref 19–32)
Calcium: 9.7 mg/dL (ref 8.4–10.5)
Chloride: 97 mEq/L (ref 96–112)
Creatinine, Ser: 0.76 mg/dL (ref 0.50–1.10)
GFR calc Af Amer: >90 mL/min (ref >90)
GFR calc non Af Amer: 86 mL/min — ABNORMAL LOW (ref >90)
Glucose, Bld: 98 mg/dL (ref 70–99)
Potassium: 4 mEq/L (ref 3.7–5.3)
Sodium: 138 mEq/L (ref 137–147)

## 2013-07-30 NOTE — Pre-Procedure Instructions (Signed)
Dawn Mercer  07/30/2013   Your procedure is scheduled on:  Tuesday June 9 th at 1225 PM  Report to [facility] at 1025 AM.  Call this number if you have problems the morning of surgery: [phone]   Remember:   Do not eat food or drink liquids after midnight.   Take these medicines the morning of surgery with A SIP OF WATER: Xanax, Nexium, Hydrocodone-acetaminophen if needed for pain, Synthroid, and Zofran for nausea if needed    Do not wear jewelry, make-up or nail polish.  Do not wear lotions, powders, or perfumes. You may wear deodorant.  Do not shave 48 hours prior to surgery.   Do not bring valuables to the hospital.  [facility] is not responsible   for any belongings or valuables.               Contacts, dentures or bridgework may not be worn into surgery.  Leave suitcase in the car. After surgery it may be brought to your room.  For patients admitted to the hospital, discharge time is determined by your                treatment team.               Patients discharged the day of surgery will not be allowed to drive home.  Name and phone number of your driver: Family  Special Instructions: [facility] - Preparing for Surgery  Before surgery, you can play an important role.  Because skin is not sterile, your skin needs to be as free of germs as possible.  You can reduce the number of germs on you skin by washing with CHG (chlorahexidine gluconate) soap before surgery.  CHG is an antiseptic cleaner which kills germs and bonds with the skin to continue killing germs even after washing.  Please DO NOT use if you have an allergy to CHG or antibacterial soaps.  If your skin becomes reddened/irritated stop using the CHG and inform your nurse when you arrive at Short Stay.  Do not shave (including legs and underarms) for at least 48 hours prior to the first CHG shower.  You may shave your face.  Please follow these instructions carefully:   1.  Shower with  CHG Soap the night before surgery and the                                morning of Surgery.  2.  If you choose to wash your hair, wash your hair first as usual with your       normal shampoo.  3.  After you shampoo, rinse your hair and body thoroughly to remove the                      Shampoo.  4.  Use CHG as you would any other liquid soap.  You can apply chg directly       to the skin and wash gently with scrungie or a clean washcloth.  5.  Apply the CHG Soap to your body ONLY FROM THE NECK DOWN.        Do not use on open wounds or open sores.  Avoid contact with your eyes,       ears, mouth and genitals (private parts).  Wash genitals (private parts)       with your normal soap.  6.  Wash thoroughly, paying special attention to the area where your surgery        will be performed.  7.  Thoroughly rinse your body with warm water from the neck down.  8.  DO NOT shower/wash with your normal soap after using and rinsing off       the CHG Soap.  9.  Pat yourself dry with a clean towel.            10.  Wear clean pajamas.            11.  Place clean sheets on your bed the night of your first shower and do not        sleep with pets.  Day of Surgery  Do not apply any lotions/deoderants the morning of surgery.  Please wear clean clothes to the hospital/surgery center.      Please read over the following fact sheets that you were given: Pain Booklet, Coughing and Deep Breathing and Surgical Site Infection Prevention

## 2013-08-02 MED ORDER — CEFAZOLIN SODIUM-DEXTROSE 2-3 GM-% IV SOLR
2.0000 g | INTRAVENOUS | Status: DC
  Filled 2013-08-02: qty 50

## 2013-08-02 NOTE — H&P (Signed)
Chief Complaint   Patient presents with   .  Cholelithiasis   HPI  Dawn Mercer is a 68 y.o. female.  HPI  This is an pleasant female referred by Dr. Harrington Challenger for evaluation of symptomatic cholelithiasis. Approximately 3 weeks ago she started having attacks of right upper quadrant and epigastric abdominal pain her he did hurt her back. She has had nausea but no emesis. This has occurred after fatty meals. Today she is currently pain-free. She has undergone radiation therapy as finished chemotherapy for lung cancer. She is being considered for surgery on the lung cancer. Bowel movements are normal  Past Medical History   Diagnosis  Date   .  VIN III (vulvar intraepithelial neoplasia III)  04/2004   .  Status post radiation therapy  10/1999   .  GERD (gastroesophageal reflux disease)    .  Hypothyroidism    .  IBS (irritable bowel syndrome)      Hx: of   .  COPD (chronic obstructive pulmonary disease)    .  Shortness of breath      Hx: of with exertion   .  Bronchitis      Hx: of   .  Hay fever      Hx: of   .  Seizures      Hx: of over 40 years ago   .  H/O hiatal hernia    .  History of radiation therapy  01/28/13-03/23/13     63 gray to right upper lobe   .  Cervical cancer  10/1999     Stage IB adenosquamous carcinoma   .  Lung cancer, upper lobe  dx'd 12/2012     chemo and XRt complete    Past Surgical History   Procedure  Laterality  Date   .  Laser ablation   04/2004     for VIN III   .  Colonoscopy w/ biopsies and polypectomy       Hx: of   .  Tubal ligation     .  Video bronchoscopy with endobronchial navigation  N/A  12/31/2012     Procedure: VIDEO BRONCHOSCOPY WITH ENDOBRONCHIAL NAVIGATION; Surgeon: Melrose Nakayama, MD; Location: [facility]; Service: Thoracic; Laterality: N/A;   .  Video bronchoscopy with endobronchial ultrasound  N/A  12/31/2012     Procedure: VIDEO BRONCHOSCOPY WITH ENDOBRONCHIAL ULTRASOUND; Surgeon: Melrose Nakayama, MD; Location: [facility]; Service:  Thoracic; Laterality: N/A;    Family History   Problem  Relation  Age of Onset   .  Heart disease  Father    .  Cervical cancer  Sister    .  Lung cancer  Brother    .  Heart disease  Brother    .  Cancer  Brother      small cell lung cancer   Social History  History   Substance Use Topics   .  Smoking status:  Former Smoker -- 1.00 packs/day for 34 years     Start date:  02/25/1998     Quit date:  02/25/1998   .  Smokeless tobacco:  Never Used   .  Alcohol Use:  No    Allergies   Allergen  Reactions   .  Latex  Dermatitis   .  Codeine  Nausea Only    Current Outpatient Prescriptions   Medication  Sig  Dispense  Refill   .  albuterol (PROVENTIL HFA;VENTOLIN HFA) 108 (90 BASE) MCG/ACT inhaler  Inhale 1 puff into the lungs every 6 (six) hours as needed for wheezing.     Marland Kitchen  ALPRAZolam (XANAX) 0.5 MG tablet  Take 0.25 mg by mouth 2 (two) times daily as needed for anxiety.     .  Cholecalciferol (VITAMIN D-3) 5000 UNITS TABS  Take 5,000 Units by mouth 2 (two) times a week.     .  clidinium-chlordiazePOXIDE (LIBRAX) 5-2.5 MG per capsule  Take 1 capsule by mouth daily as needed (for upset stomach).     .  diphenoxylate-atropine (LOMOTIL) 2.5-0.025 MG per tablet  Take 1 tablet by mouth every Sunday.     .  esomeprazole (NEXIUM) 40 MG capsule  Take 40 mg by mouth daily before breakfast.     .  gabapentin (NEURONTIN) 100 MG capsule  Take 1 capsule (100 mg total) by mouth 3 (three) times daily.  90 capsule  1   .  Hydrocodone-Acetaminophen 5-300 MG TABS  Take 5 mg by mouth 6 (six) times daily. As needed for pain  25 each  0   .  levothyroxine (SYNTHROID, LEVOTHROID) 112 MCG tablet  Take 112 mcg by mouth daily before breakfast.     .  lidocaine-prilocaine (EMLA) cream  Apply 1 application topically as needed. Apply to port 1 hour before chemo appt     .  LORazepam (ATIVAN) 1 MG tablet  One tab po 30 min before MRI  1 tablet  0   .  ondansetron (ZOFRAN) 4 MG tablet  Take 1 tablet (4 mg total)  by mouth every 6 (six) hours.  12 tablet  0   .  oxyCODONE-acetaminophen (PERCOCET) 5-325 MG per tablet  Take 2 tablets by mouth every 4 (four) hours as needed.  20 tablet  0   .  prochlorperazine (COMPAZINE) 10 MG tablet  Take 10 mg by mouth every 6 (six) hours as needed for nausea or vomiting.     .  sucralfate (CARAFATE) 1 GM/10ML suspension  Take 10 mLs (1 g total) by mouth 4 (four) times daily - with meals and at bedtime.  420 mL  1   .  temazepam (RESTORIL) 15 MG capsule  Take 1 capsule (15 mg total) by mouth at bedtime as needed for sleep.  30 capsule  0    No current facility-administered medications for this visit.   Review of Systems  Review of Systems  Constitutional: Negative for fever, chills and unexpected weight change.  HENT: Negative for congestion, hearing loss, sore throat, trouble swallowing and voice change.  Eyes: Negative for visual disturbance.  Respiratory: Positive for cough. Negative for wheezing.  Cardiovascular: Negative for chest pain, palpitations and leg swelling.  Gastrointestinal: Positive for nausea and abdominal pain. Negative for vomiting, diarrhea, constipation, blood in stool, abdominal distention and anal bleeding.  Genitourinary: Negative for hematuria, vaginal bleeding and difficulty urinating.  Musculoskeletal: Negative for arthralgias.  Skin: Negative for rash and wound.  Neurological: Negative for seizures, syncope and headaches.  Hematological: Negative for adenopathy. Does not bruise/bleed easily.  Psychiatric/Behavioral: Negative for confusion.  Blood pressure 122/80, pulse 86, temperature 97.6 F (36.4 C), height 5\' 8"  (1.727 m), weight 177 lb (80.287 kg).  Physical Exam  Physical Exam  Constitutional: She is oriented to person, place, and time. She appears well-developed and well-nourished. No distress.  HENT:  Head: Normocephalic and atraumatic.  Right Ear: External ear normal.  Left Ear: External ear normal.  Nose: Nose normal.   Mouth/Throat: Oropharynx is clear and moist.  Eyes: Conjunctivae are normal. Right eye exhibits no discharge. Left eye exhibits no discharge. No scleral icterus.  Neck: Normal range of motion. Neck supple. No tracheal deviation present. No thyromegaly present.  Cardiovascular: Normal rate, regular rhythm, normal heart sounds and intact distal pulses.  No murmur heard.  Pulmonary/Chest: Effort normal and breath sounds normal.  Abdominal: Soft. Bowel sounds are normal. There is tenderness. There is guarding.  Mild tenderness with guarding in the right upper quadrant  Musculoskeletal: Normal range of motion. She exhibits no edema and no tenderness.  Neurological: She is alert and oriented to person, place, and time.  Skin: Skin is warm and dry. No rash noted. No erythema.  Psychiatric: Her behavior is normal.  Data Reviewed  I have reviewed her ultrasound demonstrating cholelithiasis. The bile duct is normal. Liver function tests are normal  Assessment  Symptomatic cholelithiasis  Plan  I discussed the diagnosis with her in detail. I think we should go ahead and proceed with laparoscopic cholecystectomy given her eventual potential need for thoracotomy. I think she is quite symptomatic from gallstones. I discussed the procedure in detail. The patient was given Neurosurgeon. We discussed the risks and benefits of a laparoscopic cholecystectomy and possible cholangiogram including, but not limited to bleeding, infection, injury to surrounding structures such as the intestine or liver, bile leak, retained gallstones, need to convert to an open procedure, prolonged diarrhea, blood clots such as DVT, common bile duct injury, anesthesia risks, and possible need for additional procedures. The likelihood of improvement in symptoms and return to the patient's normal status is good. We discussed the typical post-operative recovery course.

## 2013-08-03 ENCOUNTER — Observation Stay (HOSPITAL_COMMUNITY)
Admission: RE | Admit: 2013-08-03 | Discharge: 2013-08-04 | Disposition: A | Discharge status: Home / Self Care | Payer: Medicare Other | Source: Ambulatory Visit | Attending: Surgery | Admitting: Surgery

## 2013-08-03 ENCOUNTER — Encounter (HOSPITAL_COMMUNITY): Payer: Medicare Other | Admitting: Certified Registered Nurse Anesthetist

## 2013-08-03 ENCOUNTER — Encounter (HOSPITAL_COMMUNITY): Payer: Self-pay | Admitting: *Deleted

## 2013-08-03 ENCOUNTER — Ambulatory Visit (HOSPITAL_COMMUNITY): Payer: Medicare Other

## 2013-08-03 ENCOUNTER — Ambulatory Visit (HOSPITAL_COMMUNITY): Payer: Medicare Other | Admitting: Certified Registered Nurse Anesthetist

## 2013-08-03 ENCOUNTER — Encounter (HOSPITAL_COMMUNITY)
Admission: RE | Disposition: A | Discharge status: Home / Self Care | Payer: Self-pay | Source: Ambulatory Visit | Attending: Surgery

## 2013-08-03 DIAGNOSIS — Z923 Personal history of irradiation: Secondary | ICD-10-CM | POA: Insufficient documentation

## 2013-08-03 DIAGNOSIS — Z85118 Personal history of other malignant neoplasm of bronchus and lung: Secondary | ICD-10-CM | POA: Insufficient documentation

## 2013-08-03 DIAGNOSIS — K824 Cholesterolosis of gallbladder: Secondary | ICD-10-CM

## 2013-08-03 DIAGNOSIS — J449 Chronic obstructive pulmonary disease, unspecified: Secondary | ICD-10-CM | POA: Insufficient documentation

## 2013-08-03 DIAGNOSIS — K8020 Calculus of gallbladder without cholecystitis without obstruction: Secondary | ICD-10-CM | POA: Diagnosis present

## 2013-08-03 DIAGNOSIS — K219 Gastro-esophageal reflux disease without esophagitis: Secondary | ICD-10-CM | POA: Insufficient documentation

## 2013-08-03 DIAGNOSIS — K8010 Calculus of gallbladder with chronic cholecystitis without obstruction: Principal | ICD-10-CM | POA: Insufficient documentation

## 2013-08-03 DIAGNOSIS — J4489 Other specified chronic obstructive pulmonary disease: Secondary | ICD-10-CM | POA: Insufficient documentation

## 2013-08-03 DIAGNOSIS — Z87891 Personal history of nicotine dependence: Secondary | ICD-10-CM | POA: Insufficient documentation

## 2013-08-03 DIAGNOSIS — F411 Generalized anxiety disorder: Secondary | ICD-10-CM | POA: Insufficient documentation

## 2013-08-03 DIAGNOSIS — E039 Hypothyroidism, unspecified: Secondary | ICD-10-CM | POA: Insufficient documentation

## 2013-08-03 DIAGNOSIS — K449 Diaphragmatic hernia without obstruction or gangrene: Secondary | ICD-10-CM | POA: Insufficient documentation

## 2013-08-03 DIAGNOSIS — Z8541 Personal history of malignant neoplasm of cervix uteri: Secondary | ICD-10-CM | POA: Insufficient documentation

## 2013-08-03 HISTORY — PX: CHOLECYSTECTOMY: SHX55

## 2013-08-03 SURGERY — LAPAROSCOPIC CHOLECYSTECTOMY
Anesthesia: General | Site: Abdomen

## 2013-08-03 MED ORDER — PROPOFOL 10 MG/ML IV BOLUS
INTRAVENOUS | Status: AC
  Filled 2013-08-03: qty 20

## 2013-08-03 MED ORDER — OXYCODONE HCL 5 MG PO TABS
ORAL_TABLET | ORAL | Status: AC
  Administered 2013-08-03: 5 mg via ORAL
  Filled 2013-08-03: qty 1

## 2013-08-03 MED ORDER — ALBUTEROL SULFATE (2.5 MG/3ML) 0.083% IN NEBU
2.5000 mg | INHALATION_SOLUTION | RESPIRATORY_TRACT | Status: DC
  Administered 2013-08-03: 2.5 mg via RESPIRATORY_TRACT

## 2013-08-03 MED ORDER — CILIDINIUM-CHLORDIAZEPOXIDE 2.5-5 MG PO CAPS
1.0000 | ORAL_CAPSULE | Freq: Every day | ORAL | Status: DC | PRN
  Filled 2013-08-03: qty 1

## 2013-08-03 MED ORDER — MORPHINE SULFATE 2 MG/ML IJ SOLN
1.0000 mg | INTRAMUSCULAR | Status: DC | PRN
  Administered 2013-08-03: 2 mg via INTRAVENOUS
  Filled 2013-08-03: qty 1

## 2013-08-03 MED ORDER — MIDAZOLAM HCL 5 MG/5ML IJ SOLN
INTRAMUSCULAR | Status: DC | PRN
  Administered 2013-08-03: 2 mg via INTRAVENOUS

## 2013-08-03 MED ORDER — GABAPENTIN 100 MG PO CAPS
100.0000 mg | ORAL_CAPSULE | Freq: Three times a day (TID) | ORAL | Status: DC
  Administered 2013-08-03 – 2013-08-04 (×3): 100 mg via ORAL
  Filled 2013-08-03 (×5): qty 1

## 2013-08-03 MED ORDER — BUPIVACAINE-EPINEPHRINE 0.25% -1:200000 IJ SOLN
INTRAMUSCULAR | Status: DC | PRN
  Administered 2013-08-03: 20 mL

## 2013-08-03 MED ORDER — CEFAZOLIN SODIUM-DEXTROSE 2-3 GM-% IV SOLR
INTRAVENOUS | Status: DC | PRN
  Administered 2013-08-03: 2 g via INTRAVENOUS

## 2013-08-03 MED ORDER — FENTANYL CITRATE 0.05 MG/ML IJ SOLN
25.0000 ug | INTRAMUSCULAR | Status: DC | PRN
  Administered 2013-08-03: 25 ug via INTRAVENOUS
  Administered 2013-08-03: 50 ug via INTRAVENOUS

## 2013-08-03 MED ORDER — IBUPROFEN 600 MG PO TABS: 600.0000 mg | ORAL_TABLET | Freq: Four times a day (QID) | ORAL | Status: DC | PRN

## 2013-08-03 MED ORDER — ONDANSETRON HCL 4 MG PO TABS
4.0000 mg | ORAL_TABLET | Freq: Four times a day (QID) | ORAL | Status: DC | PRN
  Filled 2013-08-03: qty 1

## 2013-08-03 MED ORDER — ONDANSETRON HCL 4 MG PO TABS
4.0000 mg | ORAL_TABLET | Freq: Four times a day (QID) | ORAL | Status: DC | PRN
  Administered 2013-08-04: 4 mg via ORAL

## 2013-08-03 MED ORDER — TEMAZEPAM 15 MG PO CAPS: 15.0000 mg | ORAL_CAPSULE | Freq: Every evening | ORAL | Status: DC | PRN

## 2013-08-03 MED ORDER — FENTANYL CITRATE 0.05 MG/ML IJ SOLN
INTRAMUSCULAR | Status: DC | PRN
  Administered 2013-08-03: 50 ug via INTRAVENOUS
  Administered 2013-08-03: 100 ug via INTRAVENOUS

## 2013-08-03 MED ORDER — ENOXAPARIN SODIUM 40 MG/0.4ML ~~LOC~~ SOLN
40.0000 mg | SUBCUTANEOUS | Status: DC
  Administered 2013-08-04: 40 mg via SUBCUTANEOUS
  Filled 2013-08-03 (×2): qty 0.4

## 2013-08-03 MED ORDER — ALBUTEROL SULFATE (2.5 MG/3ML) 0.083% IN NEBU
INHALATION_SOLUTION | RESPIRATORY_TRACT | Status: AC
  Filled 2013-08-03: qty 3

## 2013-08-03 MED ORDER — PROMETHAZINE HCL 25 MG/ML IJ SOLN: 6.2500 mg | INTRAMUSCULAR | Status: DC | PRN

## 2013-08-03 MED ORDER — HYDROCODONE-ACETAMINOPHEN 5-325 MG PO TABS
1.0000 | ORAL_TABLET | ORAL | Status: DC | PRN
  Administered 2013-08-04: 1 via ORAL
  Filled 2013-08-03: qty 2
  Filled 2013-08-03: qty 1

## 2013-08-03 MED ORDER — ACETAMINOPHEN 325 MG PO TABS: 325.0000 mg | ORAL_TABLET | ORAL | Status: DC | PRN

## 2013-08-03 MED ORDER — OXYCODONE HCL 5 MG/5ML PO SOLN: 5.0000 mg | Freq: Once | ORAL | Status: AC | PRN

## 2013-08-03 MED ORDER — ALBUTEROL SULFATE HFA 108 (90 BASE) MCG/ACT IN AERS: 1.0000 | INHALATION_SPRAY | Freq: Four times a day (QID) | RESPIRATORY_TRACT | Status: DC | PRN

## 2013-08-03 MED ORDER — ACETAMINOPHEN 160 MG/5ML PO SOLN
325.0000 mg | ORAL | Status: DC | PRN
  Filled 2013-08-03: qty 20.3

## 2013-08-03 MED ORDER — 0.9 % SODIUM CHLORIDE (POUR BTL) OPTIME
TOPICAL | Status: DC | PRN
  Administered 2013-08-03: 1000 mL

## 2013-08-03 MED ORDER — LIDOCAINE HCL (CARDIAC) 20 MG/ML IV SOLN
INTRAVENOUS | Status: AC
  Filled 2013-08-03: qty 5

## 2013-08-03 MED ORDER — SUCCINYLCHOLINE CHLORIDE 20 MG/ML IJ SOLN
INTRAMUSCULAR | Status: DC | PRN
  Administered 2013-08-03: 50 mg via INTRAVENOUS

## 2013-08-03 MED ORDER — ONDANSETRON HCL 4 MG/2ML IJ SOLN
INTRAMUSCULAR | Status: AC
  Filled 2013-08-03: qty 2

## 2013-08-03 MED ORDER — LORAZEPAM 1 MG PO TABS
1.0000 mg | ORAL_TABLET | Freq: Once | ORAL | Status: AC
  Administered 2013-08-03: 1 mg via ORAL
  Filled 2013-08-03: qty 1

## 2013-08-03 MED ORDER — ONDANSETRON HCL 4 MG/2ML IJ SOLN
INTRAMUSCULAR | Status: DC | PRN
  Administered 2013-08-03: 4 mg via INTRAVENOUS

## 2013-08-03 MED ORDER — MIDAZOLAM HCL 2 MG/2ML IJ SOLN
INTRAMUSCULAR | Status: AC
  Filled 2013-08-03: qty 2

## 2013-08-03 MED ORDER — LACTATED RINGERS IV SOLN
INTRAVENOUS | Status: DC
  Administered 2013-08-03: 11:00:00 via INTRAVENOUS

## 2013-08-03 MED ORDER — ALPRAZOLAM 0.25 MG PO TABS: 0.2500 mg | ORAL_TABLET | Freq: Two times a day (BID) | ORAL | Status: DC | PRN

## 2013-08-03 MED ORDER — DEXAMETHASONE SODIUM PHOSPHATE 4 MG/ML IJ SOLN
INTRAMUSCULAR | Status: DC | PRN
  Administered 2013-08-03: 4 mg via INTRAVENOUS

## 2013-08-03 MED ORDER — PROPOFOL 10 MG/ML IV BOLUS
INTRAVENOUS | Status: DC | PRN
  Administered 2013-08-03: 140 mg via INTRAVENOUS

## 2013-08-03 MED ORDER — GLYCOPYRROLATE 0.2 MG/ML IJ SOLN
INTRAMUSCULAR | Status: DC | PRN
  Administered 2013-08-03: 0.1 mg via INTRAVENOUS

## 2013-08-03 MED ORDER — KETOROLAC TROMETHAMINE 30 MG/ML IJ SOLN
INTRAMUSCULAR | Status: DC | PRN
  Administered 2013-08-03: 15 mg via INTRAVENOUS

## 2013-08-03 MED ORDER — PROCHLORPERAZINE MALEATE 10 MG PO TABS
10.0000 mg | ORAL_TABLET | Freq: Four times a day (QID) | ORAL | Status: DC | PRN
  Filled 2013-08-03: qty 1

## 2013-08-03 MED ORDER — ARTIFICIAL TEARS OP OINT
TOPICAL_OINTMENT | OPHTHALMIC | Status: AC
  Filled 2013-08-03: qty 3.5

## 2013-08-03 MED ORDER — SUCRALFATE 1 GM/10ML PO SUSP
1.0000 g | Freq: Three times a day (TID) | ORAL | Status: DC
  Administered 2013-08-03 – 2013-08-04 (×3): 1 g via ORAL
  Filled 2013-08-03 (×7): qty 10

## 2013-08-03 MED ORDER — LEVOTHYROXINE SODIUM 112 MCG PO TABS
112.0000 ug | ORAL_TABLET | Freq: Every day | ORAL | Status: DC
  Administered 2013-08-04: 112 ug via ORAL
  Filled 2013-08-03 (×2): qty 1

## 2013-08-03 MED ORDER — LIDOCAINE HCL (CARDIAC) 20 MG/ML IV SOLN
INTRAVENOUS | Status: DC | PRN
  Administered 2013-08-03: 40 mg via INTRAVENOUS
  Administered 2013-08-03: 60 mg via INTRATRACHEAL

## 2013-08-03 MED ORDER — HYDROCODONE-ACETAMINOPHEN 5-325 MG PO TABS: 1.0000 | ORAL_TABLET | Freq: Four times a day (QID) | ORAL | Status: DC | PRN

## 2013-08-03 MED ORDER — PANTOPRAZOLE SODIUM 40 MG PO TBEC
40.0000 mg | DELAYED_RELEASE_TABLET | Freq: Every day | ORAL | Status: DC
  Administered 2013-08-03 – 2013-08-04 (×2): 40 mg via ORAL
  Filled 2013-08-03 (×2): qty 1

## 2013-08-03 MED ORDER — ONDANSETRON HCL 4 MG/2ML IJ SOLN: 4.0000 mg | Freq: Four times a day (QID) | INTRAMUSCULAR | Status: DC | PRN

## 2013-08-03 MED ORDER — SODIUM CHLORIDE 0.9 % IV SOLN
INTRAVENOUS | Status: DC
  Administered 2013-08-03: 19:00:00 via INTRAVENOUS

## 2013-08-03 MED ORDER — FENTANYL CITRATE 0.05 MG/ML IJ SOLN
INTRAMUSCULAR | Status: AC
  Filled 2013-08-03: qty 5

## 2013-08-03 MED ORDER — OXYCODONE HCL 5 MG PO TABS
5.0000 mg | ORAL_TABLET | Freq: Once | ORAL | Status: AC | PRN
  Administered 2013-08-03: 5 mg via ORAL

## 2013-08-03 MED ORDER — FENTANYL CITRATE 0.05 MG/ML IJ SOLN
INTRAMUSCULAR | Status: AC
  Administered 2013-08-03: 50 ug via INTRAVENOUS
  Filled 2013-08-03: qty 2

## 2013-08-03 MED ORDER — SODIUM CHLORIDE 0.9 % IR SOLN
Status: DC | PRN
  Administered 2013-08-03: 1000 mL

## 2013-08-03 MED ORDER — PHENYLEPHRINE HCL 10 MG/ML IJ SOLN
INTRAMUSCULAR | Status: DC | PRN
  Administered 2013-08-03: 60 ug via INTRAVENOUS

## 2013-08-03 SURGICAL SUPPLY — 44 items
APL SKNCLS STERI-STRIP NONHPOA (GAUZE/BANDAGES/DRESSINGS) ×1
APPLIER CLIP 5 13 M/L LIGAMAX5 (MISCELLANEOUS) ×3
APR CLP MED LRG 5 ANG JAW (MISCELLANEOUS) ×1
BAG SPEC RTRVL LRG 6X4 10 (ENDOMECHANICALS)
BANDAGE ADHESIVE 1X3 (GAUZE/BANDAGES/DRESSINGS) ×12 IMPLANT
BENZOIN TINCTURE PRP APPL 2/3 (GAUZE/BANDAGES/DRESSINGS) ×3 IMPLANT
CANISTER SUCTION 2500CC (MISCELLANEOUS) ×3 IMPLANT
CHLORAPREP W/TINT 26ML (MISCELLANEOUS) ×3 IMPLANT
CLIP APPLIE 5 13 M/L LIGAMAX5 (MISCELLANEOUS) ×1 IMPLANT
COVER MAYO STAND STRL (DRAPES) IMPLANT
COVER SURGICAL LIGHT HANDLE (MISCELLANEOUS) ×3 IMPLANT
DECANTER SPIKE VIAL GLASS SM (MISCELLANEOUS) ×3 IMPLANT
DRAPE C-ARM 42X72 X-RAY (DRAPES) IMPLANT
DRAPE UTILITY 15X26 W/TAPE STR (DRAPE) ×6 IMPLANT
ELECT REM PT RETURN 9FT ADLT (ELECTROSURGICAL) ×3
ELECTRODE REM PT RTRN 9FT ADLT (ELECTROSURGICAL) ×1 IMPLANT
GLOVE BIO SURGEON STRL SZ7.5 (GLOVE) ×2 IMPLANT
GLOVE BIOGEL PI IND STRL 7.0 (GLOVE) ×1 IMPLANT
GLOVE BIOGEL PI IND STRL 7.5 (GLOVE) ×1 IMPLANT
GLOVE BIOGEL PI INDICATOR 7.0 (GLOVE) ×2
GLOVE BIOGEL PI INDICATOR 7.5 (GLOVE) ×2
GLOVE SURG SIGNA 7.5 PF LTX (GLOVE) ×3 IMPLANT
GLOVE SURG SS PI 7.0 STRL IVOR (GLOVE) ×2 IMPLANT
GOWN STRL REUS W/ TWL LRG LVL3 (GOWN DISPOSABLE) ×3 IMPLANT
GOWN STRL REUS W/ TWL XL LVL3 (GOWN DISPOSABLE) ×1 IMPLANT
GOWN STRL REUS W/TWL LRG LVL3 (GOWN DISPOSABLE) ×9
GOWN STRL REUS W/TWL XL LVL3 (GOWN DISPOSABLE) ×3
KIT BASIN OR (CUSTOM PROCEDURE TRAY) ×3 IMPLANT
KIT ROOM TURNOVER OR (KITS) ×3 IMPLANT
NS IRRIG 1000ML POUR BTL (IV SOLUTION) ×3 IMPLANT
PAD ARMBOARD 7.5X6 YLW CONV (MISCELLANEOUS) ×3 IMPLANT
POUCH SPECIMEN RETRIEVAL 10MM (ENDOMECHANICALS) IMPLANT
SCISSORS LAP 5X35 DISP (ENDOMECHANICALS) ×3 IMPLANT
SET CHOLANGIOGRAPH 5 50 .035 (SET/KITS/TRAYS/PACK) IMPLANT
SET IRRIG TUBING LAPAROSCOPIC (IRRIGATION / IRRIGATOR) ×3 IMPLANT
SLEEVE ENDOPATH XCEL 5M (ENDOMECHANICALS) ×6 IMPLANT
SPECIMEN JAR SMALL (MISCELLANEOUS) ×3 IMPLANT
SUT MON AB 4-0 PC3 18 (SUTURE) ×3 IMPLANT
TOWEL OR 17X24 6PK STRL BLUE (TOWEL DISPOSABLE) ×3 IMPLANT
TOWEL OR 17X26 10 PK STRL BLUE (TOWEL DISPOSABLE) ×3 IMPLANT
TRAY LAPAROSCOPIC (CUSTOM PROCEDURE TRAY) ×3 IMPLANT
TROCAR XCEL BLUNT TIP 100MML (ENDOMECHANICALS) ×3 IMPLANT
TROCAR XCEL NON-BLD 5MMX100MML (ENDOMECHANICALS) ×3 IMPLANT
WATER STERILE IRR 1000ML POUR (IV SOLUTION) IMPLANT

## 2013-08-03 NOTE — Discharge Instructions (Signed)
[facility] ______[facility] °LAPAROSCOPIC SURGERY: POST OP INSTRUCTIONS °Always review your discharge instruction sheet given to you by the facility where your surgery was performed. °IF YOU HAVE DISABILITY OR FAMILY LEAVE FORMS, YOU MUST BRING THEM TO THE OFFICE FOR PROCESSING.   °DO NOT GIVE THEM TO YOUR DOCTOR. ° °1. A prescription for pain medication may be given to you upon discharge.  Take your pain medication as prescribed, if needed.  If narcotic pain medicine is not needed, then you may take acetaminophen (Tylenol) or ibuprofen (Advil) as needed. °2. Take your usually prescribed medications unless otherwise directed. °3. If you need a refill on your pain medication, please contact your pharmacy.  They will contact our office to request authorization. Prescriptions will not be filled after 5pm or on week-ends. °4. You should follow a light diet the first few days after arrival home, such as soup and crackers, etc.  Be sure to include lots of fluids daily. °5. Most patients will experience some swelling and bruising in the area of the incisions.  Ice packs will help.  Swelling and bruising can take several days to resolve.  °6. It is common to experience some constipation if taking pain medication after surgery.  Increasing fluid intake and taking a stool softener (such as Colace) will usually help or prevent this problem from occurring.  A mild laxative (Milk of Magnesia or Miralax) should be taken according to package instructions if there are no bowel movements after 48 hours. °7. Unless discharge instructions indicate otherwise, you may remove your bandages 24-48 hours after surgery, and you may shower at that time.  You may have steri-strips (small skin tapes) in place directly over the incision.  These strips should be left on the skin for 7-10 days.  If your surgeon used skin glue on the incision, you may shower in 24 hours.  The glue will flake off over the next 2-3 weeks.  Any sutures or  staples will be removed at the office during your follow-up visit. °8. ACTIVITIES:  You may resume regular (light) daily activities beginning the next day--such as daily self-care, walking, climbing stairs--gradually increasing activities as tolerated.  You may have sexual intercourse when it is comfortable.  Refrain from any heavy lifting or straining until approved by your doctor. °a. You may drive when you are no longer taking prescription pain medication, you can comfortably wear a seatbelt, and you can safely maneuver your car and apply brakes. °b. RETURN TO WORK:  __________________________________________________________ °9. You should see your doctor in the office for a follow-up appointment approximately 2-3 weeks after your surgery.  Make sure that you call for this appointment within a day or two after you arrive home to insure a convenient appointment time. °10. OTHER INSTRUCTIONS: __________________________________________________________________________________________________________________________ __________________________________________________________________________________________________________________________ °WHEN TO CALL YOUR DOCTOR: °1. Fever over 101.0 °2. Inability to urinate °3. Continued bleeding from incision. °4. Increased pain, redness, or drainage from the incision. °5. Increasing abdominal pain ° °The clinic staff is available to answer your questions during regular business hours.  Please don’t hesitate to call and ask to speak to one of the nurses for clinical concerns.  If you have a medical emergency, go to the nearest emergency room or call 911.  A surgeon from [facility] is always on call at the hospital. °[address], [address], [place], [place]  [address] ? [address] [address], [place], [place]   [address] °[phone] ? [phone] ? FAX [phone] °Web site:   [email or web address]  What to eat:  For your first meals, you should eat  lightly; only small meals initially.  If you do not have nausea, you may eat larger meals.  Avoid spicy, greasy and heavy food.    General Anesthesia, Adult, Care After  Refer to this sheet in the next few weeks. These instructions provide you with information on caring for yourself after your procedure. Your health care provider may also give you more specific instructions. Your treatment has been planned according to current medical practices, but problems sometimes occur. Call your health care provider if you have any problems or questions after your procedure.  WHAT TO EXPECT AFTER THE PROCEDURE  After the procedure, it is typical to experience:  Sleepiness.  Nausea and vomiting. HOME CARE INSTRUCTIONS  For the first 24 hours after general anesthesia:  Have a responsible person with you.  Do not drive a car. If you are alone, do not take public transportation.  Do not drink alcohol.  Do not take medicine that has not been prescribed by your health care provider.  Do not sign important papers or make important decisions.  You may resume a normal diet and activities as directed by your health care provider.  Change bandages (dressings) as directed.  If you have questions or problems that seem related to general anesthesia, call the hospital and ask for the anesthetist or anesthesiologist on call. SEEK MEDICAL CARE IF:  You have nausea and vomiting that continue the day after anesthesia.  You develop a rash. SEEK IMMEDIATE MEDICAL CARE IF:  You have difficulty breathing.  You have chest pain.  You have any allergic problems. Document Released: 05/20/2000 Document Revised: 10/14/2012 Document Reviewed: 08/27/2012  [facility] Patient Information 2014 [place], [place].

## 2013-08-03 NOTE — OR Nursing (Signed)
Patient clearly stated when interviewed that she was NOT allergic to latex or gloves, she was sensitive to tape.

## 2013-08-03 NOTE — Op Note (Signed)
Laparoscopic Cholecystectomy Procedure Note  Indications: This patient presents with symptomatic gallbladder disease and will undergo laparoscopic cholecystectomy.  Pre-operative Diagnosis: Calculus of gallbladder without mention of cholecystitis or obstruction  Post-operative Diagnosis: Same  Surgeon: Harl Bowie   Assistants: 0  Anesthesia: General endotracheal anesthesia  ASA Class: 3  Procedure Details  The patient was seen again in the Holding Room. The risks, benefits, complications, treatment options, and expected outcomes were discussed with the patient. The possibilities of reaction to medication, pulmonary aspiration, perforation of viscus, bleeding, recurrent infection, finding a normal gallbladder, the need for additional procedures, failure to diagnose a condition, the possible need to convert to an open procedure, and creating a complication requiring transfusion or operation were discussed with the patient. The likelihood of improving the patient's symptoms with return to their baseline status is good.  The patient and/or family concurred with the proposed plan, giving informed consent. The site of surgery properly noted. The patient was taken to Operating Room, identified as TAMSEN REIST and the procedure verified as Laparoscopic Cholecystectomy with Intraoperative Cholangiogram. A Time Out was held and the above information confirmed.  Prior to the induction of general anesthesia, antibiotic prophylaxis was administered. General endotracheal anesthesia was then administered and tolerated well. After the induction, the abdomen was prepped with Chloraprep and draped in sterile fashion. The patient was positioned in the supine position.  Local anesthetic agent was injected into the skin near the umbilicus and an incision made. We dissected down to the abdominal fascia with blunt dissection.  The fascia was incised vertically and we entered the peritoneal cavity bluntly.   A pursestring suture of 0-Vicryl was placed around the fascial opening.  The Hasson cannula was inserted and secured with the stay suture.  Pneumoperitoneum was then created with CO2 and tolerated well without any adverse changes in the patient's vital signs. An 11-mm port was placed in the subxiphoid position.  Two 5-mm ports were placed in the right upper quadrant. All skin incisions were infiltrated with a local anesthetic agent before making the incision and placing the trocars.   We positioned the patient in reverse Trendelenburg, tilted slightly to the patient's left.  The gallbladder was identified, the fundus grasped and retracted cephalad. Adhesions were lysed bluntly and with the electrocautery where indicated, taking care not to injure any adjacent organs or viscus. The infundibulum was grasped and retracted laterally, exposing the peritoneum overlying the triangle of Calot. This was then divided and exposed in a blunt fashion. The cystic duct was clearly identified and bluntly dissected circumferentially. A critical view of the cystic duct and cystic artery was obtained.  The cystic duct was then ligated with clips and divided. The cystic artery was, dissected free, ligated with clips and divided as well.   The gallbladder was dissected from the liver bed in retrograde fashion with the electrocautery. The gallbladder was removed and placed in an Endocatch sac. The liver bed was irrigated and inspected. Hemostasis was achieved with the electrocautery. Copious irrigation was utilized and was repeatedly aspirated until clear.  The gallbladder and Endocatch sac were then removed through the umbilical port site.  The pursestring suture was used to close the umbilical fascia.    We again inspected the right upper quadrant for hemostasis.  Pneumoperitoneum was released as we removed the trocars.  4-0 Monocryl was used to close the skin.   Benzoin, steri-strips, and clean dressings were applied. The  patient was then extubated and brought to the recovery  room in stable condition. Instrument, sponge, and needle counts were correct at closure and at the conclusion of the case.   Findings: Chronic Cholecystitis with Cholelithiasis  Estimated Blood Loss: Minimal         Drains: 0         Specimens: Gallbladder           Complications: None; patient tolerated the procedure well.         Disposition: PACU - hemodynamically stable.         Condition: stable

## 2013-08-03 NOTE — Anesthesia Preprocedure Evaluation (Signed)
Anesthesia Evaluation  Patient identified by MRN, date of birth, ID band Patient awake    Reviewed: Allergy & Precautions, H&P , NPO status , Patient's Chart, lab work & pertinent test results  Airway Mallampati: II TM Distance: >3 FB Neck ROM: Full    Dental  (+) Upper Dentures, Lower Dentures   Pulmonary shortness of breath, neg sleep apnea, COPD COPD inhaler, Recent URI , former smoker,  + rhonchi         Cardiovascular negative cardio ROS  Rhythm:Regular Rate:Normal     Neuro/Psych Seizures -, Well Controlled,  PSYCHIATRIC DISORDERS Anxiety    GI/Hepatic Neg liver ROS, hiatal hernia, GERD-  Medicated and Controlled,  Endo/Other  Hypothyroidism   Renal/GU negative Renal ROS     Musculoskeletal   Abdominal   Peds  Hematology  (+) anemia ,   Anesthesia Other Findings   Reproductive/Obstetrics                           Anesthesia Physical Anesthesia Plan  ASA: III  Anesthesia Plan: General   Post-op Pain Management:    Induction: Intravenous  Airway Management Planned: Oral ETT  Additional Equipment: None  Intra-op Plan:   Post-operative Plan: Extubation in OR  Informed Consent: I have reviewed the patients History and Physical, chart, labs and discussed the procedure including the risks, benefits and alternatives for the proposed anesthesia with the patient or authorized representative who has indicated his/her understanding and acceptance.   Dental advisory given  Plan Discussed with: CRNA and Surgeon  Anesthesia Plan Comments:         Anesthesia Quick Evaluation

## 2013-08-03 NOTE — Anesthesia Procedure Notes (Signed)
Procedure Name: Intubation Date/Time: 08/03/2013 11:30 AM Performed by: Maryland Pink Pre-anesthesia Checklist: Patient identified, Timeout performed, Emergency Drugs available, Suction available and Patient being monitored Patient Re-evaluated:Patient Re-evaluated prior to inductionOxygen Delivery Method: Circle system utilized Preoxygenation: Pre-oxygenation with 100% oxygen Intubation Type: IV induction Ventilation: Mask ventilation without difficulty Laryngoscope Size: Mac and 3 Grade View: Grade I Tube type: Oral Tube size: 7.0 mm Number of attempts: 1 Airway Equipment and Method: Stylet and LTA kit utilized Placement Confirmation: ETT inserted through vocal cords under direct vision,  positive ETCO2 and breath sounds checked- equal and bilateral Secured at: 20 cm Tube secured with: Tape Dental Injury: Teeth and Oropharynx as per pre-operative assessment

## 2013-08-03 NOTE — Transfer of Care (Signed)
Immediate Anesthesia Transfer of Care Note  Patient: Dawn Mercer  Procedure(s) Performed: Procedure(s): LAPAROSCOPIC CHOLECYSTECTOMY (N/A)  Patient Location: PACU  Anesthesia Type:General  Level of Consciousness: awake, alert  and oriented  Airway & Oxygen Therapy: Patient Spontanous Breathing and Patient connected to nasal cannula oxygen  Post-op Assessment: Report given to PACU RN and Post -op Vital signs reviewed and stable  Post vital signs: Reviewed and stable  Complications: No apparent anesthesia complications

## 2013-08-03 NOTE — Interval H&P Note (Signed)
History and Physical Interval Note:no change in H and P  08/03/2013 10:24 AM  [name]  has presented today for surgery, with the diagnosis of cholelithiasis  The various methods of treatment have been discussed with the patient and family. After consideration of risks, benefits and other options for treatment, the patient has consented to  Procedure(s): LAPAROSCOPIC CHOLECYSTECTOMY POSSIBLE IOC (N/A) as a surgical intervention .  The patient's history has been reviewed, patient examined, no change in status, stable for surgery.  I have reviewed the patient's chart and labs.  Questions were answered to the patient's satisfaction.     Dawn Mercer

## 2013-08-03 NOTE — Anesthesia Postprocedure Evaluation (Signed)
  Anesthesia Post-op Note  Patient: Dawn Mercer  Procedure(s) Performed: Procedure(s): LAPAROSCOPIC CHOLECYSTECTOMY (N/A)  Patient Location: PACU  Anesthesia Type:General  Level of Consciousness: awake, alert  and oriented  Airway and Oxygen Therapy: Patient Spontanous Breathing and Patient connected to nasal cannula oxygen  Post-op Pain: mild  Post-op Assessment: Post-op Vital signs reviewed, Patient's Cardiovascular Status Stable, Respiratory Function Stable, Patent Airway, No signs of Nausea or vomiting and Pain level controlled  Post-op Vital Signs: Reviewed and stable  Last Vitals:  Filed Vitals:   08/03/13 1645  BP: 112/56  Pulse: 77  Temp:   Resp: 18    Complications: No apparent anesthesia complications, mild hypoxia compared to baseline post op, improved with IS and albuterol nebs, no evidence of acute process on CXR, patient to be admitted for 23 hr observation

## 2013-08-04 NOTE — Discharge Summary (Signed)
Physician Discharge Summary  Patient ID: Dawn Mercer MRN: [id number] DOB/AGE: October 27, 1945 68 y.o.  Admit date: 08/03/2013 Discharge date: 08/04/2013  Admission Diagnoses:  Discharge Diagnoses:  Active Problems:   Symptomatic cholelithiasis Lung cancer  Discharged Condition: good  Hospital Course: admitted post op for low O2 sats.  Has chronic lung diease give lung cancer, COPD, chest XRT.  No issues from pulmonary status overnight.  Discharged home  Consults: None  Significant Diagnostic Studies:   Treatments: surgery: lap chole  Discharge Exam: Blood pressure 115/57, pulse 77, temperature 97.8 F (36.6 C), temperature source Oral, resp. rate 16, height 5\' 6"  (1.676 m), weight 181 lb (82.1 kg), SpO2 95.00%. General appearance: alert, cooperative and no distress Resp: clear to auscultation bilaterally Incision/Wound: abd soft, dressings dry  Disposition: 01-Home or Self Care     Medication List    STOP taking these medications       Hydrocodone-Acetaminophen 5-300 MG Tabs  Replaced by:  HYDROcodone-acetaminophen 5-325 MG per tablet      TAKE these medications       albuterol 108 (90 BASE) MCG/ACT inhaler  Commonly known as:  PROVENTIL HFA;VENTOLIN HFA  Inhale 1 puff into the lungs every 6 (six) hours as needed for wheezing.     ALPRAZolam 0.5 MG tablet  Commonly known as:  XANAX  Take 0.25 mg by mouth 2 (two) times daily as needed for anxiety.     clidinium-chlordiazePOXIDE 5-2.5 MG per capsule  Commonly known as:  LIBRAX  Take 1 capsule by mouth daily as needed (for upset stomach).     diphenoxylate-atropine 2.5-0.025 MG per tablet  Commonly known as:  LOMOTIL  Take 1 tablet by mouth every Sunday.     esomeprazole 40 MG capsule  Commonly known as:  NEXIUM  Take 40 mg by mouth daily before breakfast.     gabapentin 100 MG capsule  Commonly known as:  NEURONTIN  Take 1 capsule (100 mg total) by mouth 3 (three) times daily.     HYDROcodone-acetaminophen 5-325 MG per tablet  Commonly known as:  NORCO  Take 1-2 tablets by mouth every 6 (six) hours as needed.     levothyroxine 112 MCG tablet  Commonly known as:  SYNTHROID, LEVOTHROID  Take 112 mcg by mouth daily before breakfast.     LORazepam 1 MG tablet  Commonly known as:  ATIVAN  Take 1 mg by mouth once. One tab po 30 min before MRI     ondansetron 4 MG tablet  Commonly known as:  ZOFRAN  Take 4 mg by mouth every 6 (six) hours as needed for nausea.     oxyCODONE-acetaminophen 5-325 MG per tablet  Commonly known as:  PERCOCET  Take 2 tablets by mouth every 4 (four) hours as needed.     prochlorperazine 10 MG tablet  Commonly known as:  COMPAZINE  Take 10 mg by mouth every 6 (six) hours as needed for nausea or vomiting.     sucralfate 1 GM/10ML suspension  Commonly known as:  CARAFATE  Take 1 g by mouth 4 (four) times daily -  with meals and at bedtime.     temazepam 15 MG capsule  Commonly known as:  RESTORIL  Take 1 capsule (15 mg total) by mouth at bedtime as needed for sleep.     Vitamin D-3 5000 UNITS Tabs  Take 5,000 Units by mouth 2 (two) times a week.           Follow-up Information  Follow up with [facility], MD. Schedule an appointment as soon as possible for a visit in 2 weeks.   Specialty:  General Surgery   Contact information:   [address] [place] [address] [phone]       Signed: Harl Bowie 08/04/2013, 6:29 AM

## 2013-08-04 NOTE — Progress Notes (Signed)
Discharge home. Home discharge instructions given, no questions verbalized. 

## 2013-08-04 NOTE — Progress Notes (Signed)
Patient ID: Dawn Mercer, female   DOB: 1946/02/06, 68 y.o.   MRN: [id number]  No complaints Denies SOB o2 sats ok  Discharge home

## 2013-08-05 ENCOUNTER — Encounter (HOSPITAL_COMMUNITY): Payer: Self-pay | Admitting: Surgery

## 2013-08-06 ENCOUNTER — Telehealth: Payer: Self-pay | Admitting: *Deleted

## 2013-08-06 ENCOUNTER — Encounter (HOSPITAL_COMMUNITY): Payer: Medicare Other

## 2013-08-06 ENCOUNTER — Ambulatory Visit (HOSPITAL_COMMUNITY): Admission: RE | Admit: 2013-08-06 | Payer: Medicare Other | Source: Ambulatory Visit

## 2013-08-06 NOTE — Telephone Encounter (Signed)
Called left vm message regarding appt with Dr. Servando Snare on 08/10/13 at 3:15 at [place] office.

## 2013-08-10 ENCOUNTER — Ambulatory Visit (INDEPENDENT_AMBULATORY_CARE_PROVIDER_SITE_OTHER): Payer: Medicare Other | Admitting: Cardiothoracic Surgery

## 2013-08-10 ENCOUNTER — Telehealth: Payer: Self-pay | Admitting: *Deleted

## 2013-08-10 ENCOUNTER — Encounter: Payer: Self-pay | Admitting: Cardiothoracic Surgery

## 2013-08-10 ENCOUNTER — Ambulatory Visit: Payer: Medicare Other | Admitting: Thoracic Surgery (Cardiothoracic Vascular Surgery)

## 2013-08-10 VITALS — BP 122/70 | HR 93 | Resp 16 | Ht 68.0 in | Wt 177.0 lb

## 2013-08-10 DIAGNOSIS — C3410 Malignant neoplasm of upper lobe, unspecified bronchus or lung: Secondary | ICD-10-CM

## 2013-08-10 DIAGNOSIS — J449 Chronic obstructive pulmonary disease, unspecified: Secondary | ICD-10-CM

## 2013-08-10 DIAGNOSIS — J4489 Other specified chronic obstructive pulmonary disease: Secondary | ICD-10-CM

## 2013-08-10 NOTE — Progress Notes (Signed)
[address]       [place] [id number]             [phone]                    Dawn [place] [facility] Medical Record #[id number] Date of Birth: 10/05/1945  Referring: Curt Bears, MD Primary Care: [name], MD  Chief Complaint:    Chief Complaint  Patient presents with  . Lung Cancer    f/u after PET and PFT'S...DISCUSS SURGERY  Malignant neoplasm of upper lobe, bronchus or lung   Primary site: Lung (Right)   Staging method: AJCC 7th Edition   Clinical: Stage IIIA (T1b, N2, M0) signed by Curt Bears, MD on 01/14/2013  3:16 PM   Summary: Stage IIIA (T1b, N2, M0)  History of Present Illness:    Dawn [place] 68 y.o. female is seen in the office  today for consideration of surgical resection of right upper lobe lung nodule. 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 Ebus showed atypical cells in the #7 node, needle biopsy of the right upper lung lesion confirmed squamous cell carcinoma. Patient underwent chemotherapy and radiation therapy.  She underwent chemoradiation with 8 cycles carboplatin and paclitaxel. She received 63 Gy of radiation. Treatment was completed in late January. She then underwent consolidation chemotherapy for another 3 cycles. A recent CT showed a partial response. She tolerated treatment well. She did lose about 20 pounds with the initial treatment, but did not have any weight loss with the second round of chemo. Last week she had urgent cholecystectomy because of increasing gallbladder symptoms. She didn't have some trouble with hypoxia postoperatively but this resolved.   Current Activity/ Functional Status:  Patient is independent with mobility/ambulation, transfers, ADL's, IADL's.   Zubrod Score: At the time of surgery this patient's most appropriate activity status/level should be described as: []     0     Normal activity, no symptoms [x]     1    Restricted in physical strenuous activity but ambulatory, able to do out light work []     2    Ambulatory and capable of self care, unable to do work activities, up and about               >50 % of waking hours                              []     3    Only limited self care, in bed greater than 50% of waking hours []     4    Completely disabled, no self care, confined to bed or chair []     5    Moribund   Past Medical History  Diagnosis Date  . 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 Status post radiation therapy 10/1999  . GERD (gastroesophageal reflux disease)   . Hypothyroidism   . IBS (irritable bowel syndrome)     Hx: of  . COPD (chronic obstructive pulmonary disease)   . Shortness of breath     Hx: of with exertion  . Bronchitis     Hx: of  . Hay fever     Hx: of  . Seizures     Hx: of over 40 years ago  .  H/O hiatal hernia   . History of radiation therapy 01/28/13-03/23/13    63 gray to right upper lobe  . 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 Lung cancer, upper lobe dx'd 12/2012    chemo and XRt complete  . Anxiety     Past Surgical History  Procedure Laterality Date  . Laser ablation  04/2004    for VIN III  . Colonoscopy w/ biopsies and polypectomy      Hx: of  . Tubal ligation    . Video bronchoscopy with endobronchial navigation N/A 12/31/2012    Procedure: VIDEO BRONCHOSCOPY WITH ENDOBRONCHIAL NAVIGATION;  Surgeon: Melrose Nakayama, MD;  Location: [facility];  Service: Thoracic;  Laterality: N/A;  . Video bronchoscopy with endobronchial ultrasound N/A 12/31/2012    Procedure: VIDEO BRONCHOSCOPY WITH ENDOBRONCHIAL ULTRASOUND;  Surgeon: Melrose Nakayama, MD;  Location: [facility];  Service: Thoracic;  Laterality: N/A;  . Cholecystectomy N/A 08/03/2013    Procedure: LAPAROSCOPIC CHOLECYSTECTOMY;  Surgeon: Harl Bowie, MD;  Location: [facility];  Service: General;  Laterality: N/A;     Family History  Problem Relation Age of Onset  . Heart disease Father   . Cervical cancer Sister   . Lung cancer Brother   . Heart disease Brother   . Cancer Brother     small cell lung cancer    History   Social History  . Marital Status: Married    Spouse Name: N/A    Number of Children: N/A  . Years of Education: N/A   Occupational History  . Not on file.   Social History Main Topics  . Smoking status: Former Smoker -- 1.00 packs/day for 34 years    Start date: 02/25/1998    Quit date: 02/25/1998  . Smokeless tobacco: Never Used  . Alcohol Use: No  . Drug Use: No  . Sexual Activity: Not on file   Other Topics Concern  . Not on file   Social History Narrative  . No narrative on file    History  Smoking status  . Former Smoker -- 1.00 packs/day for 34 years  . Start date: 02/25/1998  . Quit date: 02/25/1998  Smokeless tobacco  . Never Used    History  Alcohol Use No     Allergies  Allergen Reactions  . Latex Dermatitis  . Codeine Nausea Only    Current Outpatient Prescriptions  Medication Sig Dispense Refill  . albuterol (PROVENTIL HFA;VENTOLIN HFA) 108 (90 BASE) MCG/ACT inhaler Inhale 1 puff into the lungs every 6 (six) hours as needed for wheezing.      Marland Kitchen ALPRAZolam (XANAX) 0.5 MG tablet Take 0.25 mg by mouth 2 (two) times daily as needed for anxiety.      . Cholecalciferol (VITAMIN D-3) 5000 UNITS TABS Take 5,000 Units by mouth 2 (two) times a week.      . clidinium-chlordiazePOXIDE (LIBRAX) 5-2.5 MG per capsule Take 1 capsule by mouth daily as needed (for upset stomach).      . esomeprazole (NEXIUM) 40 MG capsule Take 40 mg by mouth daily before breakfast.      . HYDROcodone-acetaminophen (NORCO) 5-325 MG per tablet Take 1-2 tablets by mouth every 6 (six) hours as needed.  40 tablet  0  . levothyroxine (SYNTHROID, LEVOTHROID) 112 MCG tablet Take 112 mcg by mouth daily before breakfast.      . sucralfate (CARAFATE) 1 GM/10ML suspension Take 1  g by mouth 4 (four) times daily -  with meals and at bedtime.      . gabapentin (NEURONTIN) 100 MG capsule Take 1 capsule (100 mg total) by mouth 3 (three) times daily.  90 capsule  1   No current facility-administered medications for this visit.     Review of Systems:     Cardiac Review of Systems: Y or N  Chest Pain [ n   ]  Resting SOB [ n  ] Exertional SOB  [ y ]  Orthopnea [ n ]   Pedal Edema [ n  ]    Palpitations [  n] Syncope  [ n ]   Presyncope [ n  ]  General Review of Systems: [Y] = yes [  ]=no Constitional: recent weight change [ n ];  Wt loss over the last 3 months [   ] anorexia Blue.Reese  ]; fatigue [ y ]; nausea [  ]; night sweats [n  ]; fever [ n ]; or chills [ n ];          Dental: poor dentition[  ]; Last Dentist visit:   Eye : blurred vision [  ]; diplopia [   ]; vision changes [  ];  Amaurosis fugax[  ]; Resp: cough [n  ];  wheezing[n  ];  hemoptysis[ n ]; shortness of breath[y  ]; paroxysmal nocturnal dyspnea[  ]; dyspnea on exertion[  ]; or orthopnea[  ];  GI:  gallstones[  ], vomiting[  ];  dysphagia[  ]; melena[  ];  hematochezia [  ]; heartburn[  ];   Hx of  Colonoscopy[  ]; GU: kidney stones [  ]; hematuria[  ];   dysuria [  ];  nocturia[  ];  history of     obstruction [  ]; urinary frequency [  ]             Skin: rash, swelling[  ];, hair loss[  ];  peripheral edema[  ];  or itching[  ]; Musculosketetal: myalgias[  ];  joint swelling[  ];  joint erythema[  ];  joint pain[  ];  back pain[  ];  Heme/Lymph: bruising[  ];  bleeding[  ];  anemia[  ];  Neuro: TIA[  ];  headaches[  ];  stroke[  ];  vertigo[  ];  seizures[  ];   paresthesias[  ];  difficulty walking[  ];  Psych:depression[  ]; anxiety[  ];  Endocrine: diabetes[  ];  thyroid dysfunction[  ];  Immunizations: Flu up to date [?  ]; Pneumococcal up to date [?  ];  Other:  Physical Exam: BP 122/70  Pulse 93  Resp 16  Ht 5\' 8"  (1.727 m)  Wt 177 lb (80.287 kg)  BMI 26.92 kg/m2  SpO2 90%  PHYSICAL  EXAMINATION:  General appearance: alert and cooperative Neurologic: intact Heart: regular rate and rhythm, S1, S2 normal, no murmur, click, rub or gallop Lungs: clear to auscultation bilaterally Abdomen: soft, non-tender; bowel sounds normal; no masses,  no organomegaly Extremities: extremities normal, atraumatic, no cyanosis or edema and Homans sign is negative, no sign of DVT Patient has no cervical or supraclavicular adenopathy  Diagnostic Studies & Laboratory data:     Recent Radiology Findings:   Dg Chest 2 View  07/30/2013   CLINICAL DATA:  Preoperative cholecystectomy; cough and shortness of breast; history of previous lung carcinoma  EXAM: CHEST  2 VIEW  COMPARISON:  Chest CT Jul 12, 2013  FINDINGS: Port-A-Cath tip is at the cavoatrial junction.  No pneumothorax. There is underlying emphysematous change. There is chronic inflammatory type change in the lungs diffusely. There is no frank edema or consolidation. The spiculated area in the right upper lobe seen on recent CT is not well seen on radiographic examination.  Heart size is normal. Pulmonary vascularity reflects underlying emphysema. No adenopathy. No bone lesions.  IMPRESSION: Underlying emphysema with chronic inflammatory type change. A spiculated lesion in the right upper lobe seen on recent CT is not well seen on this study. There is no consolidation. No adenopathy. No pneumothorax.   Electronically Signed   By: Lowella Grip M.D.   On: 07/30/2013 15:02   Ct Chest W Contrast  07/12/2013   CLINICAL DATA:  Lung cancer restaging. Chemotherapy and radiation therapy complete.  EXAM: CT CHEST WITH CONTRAST  TECHNIQUE: Multidetector CT imaging of the chest was performed during intravenous contrast administration.  CONTRAST:  59mL OMNIPAQUE IOHEXOL 300 MG/ML  SOLN  COMPARISON:  04/23/2013 and 11/17/2012.  PET 12/17/2012.  FINDINGS: Right IJ Port-A-Cath terminates in the high right atrium. Heart size normal. No pericardial effusion.  Mediastinal lymph nodes are not enlarged by CT size criteria. No hilar or axillary adenopathy. Atherosclerotic calcification of the arterial vasculature, including involvement of the coronary arteries.  Biapical pleural parenchymal scarring. Moderate centrilobular emphysema. Spiculated nodule in the right upper lobe measures 8 x 12 mm, stable (image 26). Scattered mild peribronchovascular nodularity is unchanged. Mild mosaic attenuation in the lungs, as before. No pleural fluid. Airway is unremarkable.  Incidental imaging of the upper abdomen shows the visualized portion of the liver, gallbladder and right adrenal glands are unremarkable. Left adrenal nodule measures 1.2 x 1.3 cm and 30 Hounsfield units, unchanged from 11/17/2012 and not shown to be hypermetabolic on 44/04/4740. Visualized portions of the kidneys, spleen, pancreas, stomach and bowel are otherwise unremarkable. No upper abdominal adenopathy. No worrisome lytic or sclerotic lesions.  IMPRESSION: 1. Spiculated right upper lobe nodule is stable. 2. Coronary artery calcification. 3. Left adrenal adenoma.   Electronically Signed   By: Lorin Picket M.D.   On: 07/12/2013 15:01   Dg Chest Port 1 View  08/03/2013   CLINICAL DATA:  68 year old female with shortness of Breath status post cholecystectomy. Hypoxia. Initial encounter.  EXAM: PORTABLE CHEST - 1 VIEW  COMPARISON:  07/30/2013 and earlier.  FINDINGS: Portable AP semi upright view at a 1558 hrs. Stable right chest porta cath. Stable cardiac size and mediastinal contours. Visualized tracheal air column is within normal limits. No pneumothorax. Mild crowding of lower lobe markings, otherwise stable increased interstitial opacity which is chronic. No pulmonary edema, pleural effusion or consolidation.  IMPRESSION: Chronic interstitial lung changes. Lower lung volumes now with mild atelectasis.   Electronically Signed   By: Lars Pinks M.D.   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 Before considering surgical resection, we'll obtain MRI of the brain, and full pulmonary function studies. We'll consider repeat PET scan, but before any surgical resection of the small primary tumor mediastinal endoscopy would be considered. Patient currently recovering from her recent gallbladder surgery well we'll plan to see her back next week after the above studies are done.     Grace Isaac MD      [address] [place] [address] Office [phone]   Beeper [phone]  08/10/2013 5:19 PM

## 2013-08-10 NOTE — Telephone Encounter (Signed)
Called and remind pt of appt today with Dr. Servando Snare.  She verbalized understanding of appt time and place.

## 2013-08-11 ENCOUNTER — Other Ambulatory Visit: Payer: Self-pay | Admitting: *Deleted

## 2013-08-11 DIAGNOSIS — C3410 Malignant neoplasm of upper lobe, unspecified bronchus or lung: Secondary | ICD-10-CM

## 2013-08-12 ENCOUNTER — Other Ambulatory Visit: Payer: Self-pay

## 2013-08-12 DIAGNOSIS — D381 Neoplasm of uncertain behavior of trachea, bronchus and lung: Secondary | ICD-10-CM

## 2013-08-19 ENCOUNTER — Ambulatory Visit (HOSPITAL_COMMUNITY)
Admission: RE | Admit: 2013-08-19 | Discharge: 2013-08-19 | Disposition: A | Discharge status: Home / Self Care | Payer: Medicare Other | Source: Ambulatory Visit | Attending: Cardiothoracic Surgery | Admitting: Cardiothoracic Surgery

## 2013-08-19 DIAGNOSIS — C3410 Malignant neoplasm of upper lobe, unspecified bronchus or lung: Secondary | ICD-10-CM

## 2013-08-19 MED ORDER — ALBUTEROL SULFATE (2.5 MG/3ML) 0.083% IN NEBU
2.5000 mg | INHALATION_SOLUTION | Freq: Once | RESPIRATORY_TRACT | Status: AC
  Administered 2013-08-19: 2.5 mg via RESPIRATORY_TRACT

## 2013-08-19 MED ORDER — GADOBENATE DIMEGLUMINE 529 MG/ML IV SOLN
20.0000 mL | Freq: Once | INTRAVENOUS | Status: AC | PRN
  Administered 2013-08-19: 16 mL via INTRAVENOUS

## 2013-08-20 ENCOUNTER — Ambulatory Visit (HOSPITAL_COMMUNITY)
Admission: RE | Admit: 2013-08-20 | Discharge: 2013-08-20 | Disposition: A | Discharge status: Home / Self Care | Payer: Medicare Other | Source: Ambulatory Visit | Attending: Cardiothoracic Surgery | Admitting: Cardiothoracic Surgery

## 2013-08-20 ENCOUNTER — Encounter: Payer: Self-pay | Admitting: Cardiothoracic Surgery

## 2013-08-20 ENCOUNTER — Encounter (HOSPITAL_COMMUNITY): Payer: Self-pay

## 2013-08-20 ENCOUNTER — Ambulatory Visit (INDEPENDENT_AMBULATORY_CARE_PROVIDER_SITE_OTHER): Payer: Medicare Other | Admitting: Cardiothoracic Surgery

## 2013-08-20 VITALS — BP 111/74 | HR 114 | Resp 20 | Ht 68.0 in | Wt 177.0 lb

## 2013-08-20 DIAGNOSIS — D381 Neoplasm of uncertain behavior of trachea, bronchus and lung: Secondary | ICD-10-CM

## 2013-08-20 DIAGNOSIS — C3490 Malignant neoplasm of unspecified part of unspecified bronchus or lung: Secondary | ICD-10-CM | POA: Insufficient documentation

## 2013-08-20 DIAGNOSIS — R599 Enlarged lymph nodes, unspecified: Secondary | ICD-10-CM

## 2013-08-20 DIAGNOSIS — R590 Localized enlarged lymph nodes: Secondary | ICD-10-CM

## 2013-08-20 DIAGNOSIS — C3410 Malignant neoplasm of upper lobe, unspecified bronchus or lung: Secondary | ICD-10-CM

## 2013-08-20 DIAGNOSIS — R911 Solitary pulmonary nodule: Secondary | ICD-10-CM | POA: Insufficient documentation

## 2013-08-20 LAB — GLUCOSE, CAPILLARY: Glucose-Capillary: 93 mg/dL (ref 70–99)

## 2013-08-20 MED ORDER — FLUDEOXYGLUCOSE F - 18 (FDG) INJECTION
10.0000 | Freq: Once | INTRAVENOUS | Status: AC | PRN
  Administered 2013-08-20: 10 via INTRAVENOUS

## 2013-08-23 ENCOUNTER — Ambulatory Visit (HOSPITAL_BASED_OUTPATIENT_CLINIC_OR_DEPARTMENT_OTHER): Payer: Medicare Other

## 2013-08-23 VITALS — BP 110/61 | HR 86 | Temp 97.4°F

## 2013-08-23 DIAGNOSIS — C3410 Malignant neoplasm of upper lobe, unspecified bronchus or lung: Secondary | ICD-10-CM

## 2013-08-23 DIAGNOSIS — Z452 Encounter for adjustment and management of vascular access device: Secondary | ICD-10-CM

## 2013-08-23 DIAGNOSIS — Z95828 Presence of other vascular implants and grafts: Secondary | ICD-10-CM

## 2013-08-23 MED ORDER — HEPARIN SOD (PORK) LOCK FLUSH 100 UNIT/ML IV SOLN
500.0000 [IU] | Freq: Once | INTRAVENOUS | Status: AC
  Administered 2013-08-23: 500 [IU] via INTRAVENOUS
  Filled 2013-08-23: qty 5

## 2013-08-23 MED ORDER — SODIUM CHLORIDE 0.9 % IJ SOLN
10.0000 mL | INTRAMUSCULAR | Status: DC | PRN
  Administered 2013-08-23: 10 mL via INTRAVENOUS
  Filled 2013-08-23: qty 10

## 2013-08-23 NOTE — Progress Notes (Signed)
[address]       [place] [address]             [phone]                    Courtny O Escoto [place] Medical Record #[id number] Date of Birth: 1945-08-06  Referring: Curt Bears, MD Primary Care: [name], MD  Chief Complaint:    Chief Complaint  Patient presents with  . Follow-up    with PET, MRI BRAIN and PFT'S  Malignant neoplasm of upper lobe, bronchus or lung   Primary site: Lung (Right)   Staging method: AJCC 7th Edition   Clinical: Stage IIIA (T1b, N2, M0) signed by Curt Bears, MD on 01/14/2013  3:16 PM   Summary: Stage IIIA (T1b, N2, M0)  History of Present Illness:    Dawn Mercer 68 y.o. female is seen in the office  today for consideration of surgical resection of right upper lobe lung nodule. 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 Ebus showed atypical cells in the #7 node, needle biopsy of the right upper lung lesion confirmed squamous cell carcinoma. Patient underwent chemotherapy and radiation therapy.  She underwent chemoradiation with 8 cycles carboplatin and paclitaxel. She received 63 Gy of radiation. Treatment was completed in late January. She then underwent consolidation chemotherapy for another 3 cycles. A recent CT showed a partial response. She tolerated treatment well. She did lose about 20 pounds with the initial treatment, but did not have any weight loss with the second round of chemo. Last week she had urgent cholecystectomy because of increasing gallbladder symptoms. She didn't have some trouble with hypoxia postoperatively but this resolved.   Current Activity/ Functional Status:  Patient is independent with mobility/ambulation, transfers, ADL's, IADL's.   Zubrod Score: At the time of surgery this patient's most appropriate activity status/level should be described as: []     0    Normal  activity, no symptoms [x]     1    Restricted in physical strenuous activity but ambulatory, able to do out light work []     2    Ambulatory and capable of self care, unable to do work activities, up and about               >50 % of waking hours                              []     3    Only limited self care, in bed greater than 50% of waking hours []     4    Completely disabled, no self care, confined to bed or chair []     5    Moribund   Past Medical History  Diagnosis Date  . 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 Status post radiation therapy 10/1999  . GERD (gastroesophageal reflux disease)   . Hypothyroidism   . IBS (irritable bowel syndrome)     Hx: of  . COPD (chronic obstructive pulmonary disease)   . Shortness of breath     Hx: of with exertion  . Bronchitis     Hx: of  . Hay fever     Hx: of  . Seizures     Hx: of over 40 years ago  . H/O  hiatal hernia   . History of radiation therapy 01/28/13-03/23/13    63 gray to right upper lobe  . 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 Lung cancer, upper lobe dx'd 12/2012    chemo and XRt complete  . Anxiety     Past Surgical History  Procedure Laterality Date  . Laser ablation  04/2004    for VIN III  . Colonoscopy w/ biopsies and polypectomy      Hx: of  . Tubal ligation    . Video bronchoscopy with endobronchial navigation N/A 12/31/2012    Procedure: VIDEO BRONCHOSCOPY WITH ENDOBRONCHIAL NAVIGATION;  Surgeon: Melrose Nakayama, MD;  Location: [facility];  Service: Thoracic;  Laterality: N/A;  . Video bronchoscopy with endobronchial ultrasound N/A 12/31/2012    Procedure: VIDEO BRONCHOSCOPY WITH ENDOBRONCHIAL ULTRASOUND;  Surgeon: Melrose Nakayama, MD;  Location: [facility];  Service: Thoracic;  Laterality: N/A;  . Cholecystectomy N/A 08/03/2013    Procedure: LAPAROSCOPIC CHOLECYSTECTOMY;  Surgeon: Harl Bowie, MD;  Location: [facility];  Service: General;  Laterality: N/A;    Family  History  Problem Relation Age of Onset  . Heart disease Father   . Cervical cancer Sister   . Lung cancer Brother   . Heart disease Brother   . Cancer Brother     small cell lung cancer    History   Social History  . Marital Status: Married    Spouse Name: N/A    Number of Children: N/A  . Years of Education: N/A   Occupational History  . Not on file.   Social History Main Topics  . Smoking status: Former Smoker -- 1.00 packs/day for 34 years    Start date: 02/25/1998    Quit date: 02/25/1998  . Smokeless tobacco: Never Used  . Alcohol Use: No  . Drug Use: No  . Sexual Activity: Not on file   Other Topics Concern  . Not on file   Social History Narrative  . No narrative on file    History  Smoking status  . Former Smoker -- 1.00 packs/day for 34 years  . Start date: 02/25/1998  . Quit date: 02/25/1998  Smokeless tobacco  . Never Used    History  Alcohol Use No     Allergies  Allergen Reactions  . Latex Dermatitis  . Codeine Nausea Only    Current Outpatient Prescriptions  Medication Sig Dispense Refill  . albuterol (PROVENTIL HFA;VENTOLIN HFA) 108 (90 BASE) MCG/ACT inhaler Inhale 1 puff into the lungs every 6 (six) hours as needed for wheezing.      Marland Kitchen ALPRAZolam (XANAX) 0.5 MG tablet Take 0.25 mg by mouth 2 (two) times daily as needed for anxiety.      . Cholecalciferol (VITAMIN D-3) 5000 UNITS TABS Take 5,000 Units by mouth 2 (two) times a week.      . clidinium-chlordiazePOXIDE (LIBRAX) 5-2.5 MG per capsule Take 1 capsule by mouth daily as needed (for upset stomach).      . esomeprazole (NEXIUM) 40 MG capsule Take 40 mg by mouth daily before breakfast.      . gabapentin (NEURONTIN) 100 MG capsule Take 1 capsule (100 mg total) by mouth 3 (three) times daily.  90 capsule  1  . HYDROcodone-acetaminophen (NORCO) 5-325 MG per tablet Take 1-2 tablets by mouth every 6 (six) hours as needed.  40 tablet  0  . levothyroxine (SYNTHROID, LEVOTHROID) 112 MCG  tablet Take 112 mcg by mouth daily  before breakfast.      . ondansetron (ZOFRAN) 4 MG tablet       . oxyCODONE-acetaminophen (PERCOCET/ROXICET) 5-325 MG per tablet       . sucralfate (CARAFATE) 1 GM/10ML suspension Take 1 g by mouth 4 (four) times daily -  with meals and at bedtime.       No current facility-administered medications for this visit.     Review of Systems:     Cardiac Review of Systems: Y or N  Chest Pain [ n   ]  Resting SOB [ n  ] Exertional SOB  [ y ]  Orthopnea [ n ]   Pedal Edema [ n  ]    Palpitations [  n] Syncope  [ n ]   Presyncope [ n  ]  General Review of Systems: [Y] = yes [  ]=no Constitional: recent weight change [ n ];  Wt loss over the last 3 months [   ] anorexia Blue.Reese  ]; fatigue [ y ]; nausea [  ]; night sweats [n  ]; fever [ n ]; or chills [ n ];          Dental: poor dentition[  ]; Last Dentist visit:   Eye : blurred vision [  ]; diplopia [   ]; vision changes [  ];  Amaurosis fugax[  ]; Resp: cough [n  ];  wheezing[n  ];  hemoptysis[ n ]; shortness of breath[y  ]; paroxysmal nocturnal dyspnea[  ]; dyspnea on exertion[  ]; or orthopnea[  ];  GI:  gallstones[  ], vomiting[  ];  dysphagia[  ]; melena[  ];  hematochezia [  ]; heartburn[  ];   Hx of  Colonoscopy[  ]; GU: kidney stones [  ]; hematuria[  ];   dysuria [  ];  nocturia[  ];  history of     obstruction [  ]; urinary frequency [  ]             Skin: rash, swelling[  ];, hair loss[  ];  peripheral edema[  ];  or itching[  ]; Musculosketetal: myalgias[  ];  joint swelling[  ];  joint erythema[  ];  joint pain[  ];  back pain[  ];  Heme/Lymph: bruising[  ];  bleeding[  ];  anemia[  ];  Neuro: TIA[  ];  headaches[  ];  stroke[  ];  vertigo[  ];  seizures[  ];   paresthesias[  ];  difficulty walking[  ];  Psych:depression[  ]; anxiety[  ];  Endocrine: diabetes[  ];  thyroid dysfunction[  ];  Immunizations: Flu up to date [?  ]; Pneumococcal up to date [?  ];  Other:  Physical Exam: BP 111/74  Pulse  114  Resp 20  Ht 5\' 8"  (1.727 m)  Wt 177 lb (80.287 kg)  BMI 26.92 kg/m2  SpO2 91%  PHYSICAL EXAMINATION:  General appearance: alert and cooperative Neurologic: intact Heart: regular rate and rhythm, S1, S2 normal, no murmur, click, rub or gallop Lungs: clear to auscultation bilaterally Abdomen: soft, non-tender; bowel sounds normal; no masses,  no organomegaly Extremities: extremities normal, atraumatic, no cyanosis or edema and Homans sign is negative, no sign of DVT Patient has no cervical or supraclavicular adenopathy  Diagnostic Studies & Laboratory data:     Recent Radiology Findings:   Dg Chest 2 View  07/30/2013   CLINICAL DATA:  Preoperative cholecystectomy; cough and shortness of breast; history of previous  lung carcinoma  EXAM: CHEST  2 VIEW  COMPARISON:  Chest CT Jul 12, 2013  FINDINGS: Port-A-Cath tip is at the cavoatrial junction. No pneumothorax. There is underlying emphysematous change. There is chronic inflammatory type change in the lungs diffusely. There is no frank edema or consolidation. The spiculated area in the right upper lobe seen on recent CT is not well seen on radiographic examination.  Heart size is normal. Pulmonary vascularity reflects underlying emphysema. No adenopathy. No bone lesions.  IMPRESSION: Underlying emphysema with chronic inflammatory type change. A spiculated lesion in the right upper lobe seen on recent CT is not well seen on this study. There is no consolidation. No adenopathy. No pneumothorax.   Electronically Signed   By: Lowella Grip M.D.   On: 07/30/2013 15:02   Ct Chest W Contrast  07/12/2013   CLINICAL DATA:  Lung cancer restaging. Chemotherapy and radiation therapy complete.  EXAM: CT CHEST WITH CONTRAST  TECHNIQUE: Multidetector CT imaging of the chest was performed during intravenous contrast administration.  CONTRAST:  79mL OMNIPAQUE IOHEXOL 300 MG/ML  SOLN  COMPARISON:  04/23/2013 and 11/17/2012.  PET 12/17/2012.  FINDINGS: Right  IJ Port-A-Cath terminates in the high right atrium. Heart size normal. No pericardial effusion. Mediastinal lymph nodes are not enlarged by CT size criteria. No hilar or axillary adenopathy. Atherosclerotic calcification of the arterial vasculature, including involvement of the coronary arteries.  Biapical pleural parenchymal scarring. Moderate centrilobular emphysema. Spiculated nodule in the right upper lobe measures 8 x 12 mm, stable (image 26). Scattered mild peribronchovascular nodularity is unchanged. Mild mosaic attenuation in the lungs, as before. No pleural fluid. Airway is unremarkable.  Incidental imaging of the upper abdomen shows the visualized portion of the liver, gallbladder and right adrenal glands are unremarkable. Left adrenal nodule measures 1.2 x 1.3 cm and 30 Hounsfield units, unchanged from 11/17/2012 and not shown to be hypermetabolic on 21/30/8657. Visualized portions of the kidneys, spleen, pancreas, stomach and bowel are otherwise unremarkable. No upper abdominal adenopathy. No worrisome lytic or sclerotic lesions.  IMPRESSION: 1. Spiculated right upper lobe nodule is stable. 2. Coronary artery calcification. 3. Left adrenal adenoma.   Electronically Signed   By: Lorin Picket M.D.   On: 07/12/2013 15:01   Dg Chest Port 1 View  08/03/2013   CLINICAL DATA:  68 year old female with shortness of Breath status post cholecystectomy. Hypoxia. Initial encounter.  EXAM: PORTABLE CHEST - 1 VIEW  COMPARISON:  07/30/2013 and earlier.  FINDINGS: Portable AP semi upright view at a 1558 hrs. Stable right chest porta cath. Stable cardiac size and mediastinal contours. Visualized tracheal air column is within normal limits. No pneumothorax. Mild crowding of lower lobe markings, otherwise stable increased interstitial opacity which is chronic. No pulmonary edema, pleural effusion or consolidation.  IMPRESSION: Chronic interstitial lung changes. Lower lung volumes now with mild atelectasis.    Electronically Signed   By: Lars Pinks M.D.   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 With here good response  to current treatment and very limited reserve pulmonary wise I am reluctant to  recommend proceeding with surgical resection in the setting  of known IIIa disease.  Patient will return in two weeks , I will discuss case with Radiation oncology as to where the fields were.      Grace Isaac MD      [address] [place] [address] Office [phone]   Beeper [phone]  08/23/2013 5:42 PM

## 2013-08-23 NOTE — Patient Instructions (Signed)

## 2013-08-29 LAB — PULMONARY FUNCTION TEST
DL/VA % pred: 55 %
DL/VA: 2.66 ml/min/mmHg/L
DLCO cor % pred: 29 %
DLCO cor: 7.25 ml/min/mmHg
DLCO unc % pred: 29 %
DLCO unc: 7.25 ml/min/mmHg
FEF 25-75 Post: 0.67 L/sec
FEF 25-75 Pre: 0.54 L/sec
FEF2575-%Change-Post: 24 %
FEF2575-%Pred-Post: 33 %
FEF2575-%Pred-Pre: 26 %
FEV1-%Change-Post: 3 %
FEV1-%Pred-Post: 45 %
FEV1-%Pred-Pre: 43 %
FEV1-Post: 1.07 L
FEV1-Pre: 1.03 L
FEV1FVC-%Change-Post: 6 %
FEV1FVC-%Pred-Pre: 81 %
FEV6-%Change-Post: -3 %
FEV6-%Pred-Post: 53 %
FEV6-%Pred-Pre: 54 %
FEV6-Post: 1.58 L
FEV6-Pre: 1.63 L
FEV6FVC-%Change-Post: 0 %
FEV6FVC-%Pred-Post: 103 %
FEV6FVC-%Pred-Pre: 103 %
FVC-%Change-Post: -2 %
FVC-%Pred-Post: 51 %
FVC-%Pred-Pre: 52 %
FVC-Post: 1.6 L
FVC-Pre: 1.64 L
Post FEV1/FVC ratio: 67 %
Post FEV6/FVC ratio: 100 %
Pre FEV1/FVC ratio: 63 %
Pre FEV6/FVC Ratio: 100 %
RV % pred: 129 %
RV: 2.76 L
TLC % pred: 86 %
TLC: 4.39 L

## 2013-09-03 ENCOUNTER — Ambulatory Visit (INDEPENDENT_AMBULATORY_CARE_PROVIDER_SITE_OTHER): Payer: Medicare Other | Admitting: Cardiothoracic Surgery

## 2013-09-03 ENCOUNTER — Encounter: Payer: Self-pay | Admitting: Cardiothoracic Surgery

## 2013-09-03 VITALS — BP 130/72 | HR 100 | Resp 20 | Ht 68.0 in | Wt 177.0 lb

## 2013-09-03 DIAGNOSIS — C3410 Malignant neoplasm of upper lobe, unspecified bronchus or lung: Secondary | ICD-10-CM

## 2013-09-03 NOTE — Progress Notes (Signed)
Dawn Mercer       [place] [address]             [phone]                    Dawn Mercer Date of Birth: Jan 22, 1946  Referring: Curt Bears, MD Primary Care: [name], MD  Chief Complaint:    Chief Complaint  Patient presents with  . Lung Cancer    2 week f/u  Malignant neoplasm of upper lobe, bronchus or lung   Primary site: Lung (Right)   Staging method: AJCC 7th Edition   Clinical: Stage IIIA (T1b, N2, M0) signed by Curt Bears, MD on 01/14/2013  3:16 PM   Summary: Stage IIIA (T1b, N2, M0)  History of Present Illness:    Dawn Mercer 68 y.o. female is seen in the office  today for consideration of surgical resection of right upper lobe lung nodule. 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 Ebus showed atypical cells in the #7 node, needle biopsy of the right upper lung lesion confirmed squamous cell carcinoma. Patient underwent chemotherapy and radiation therapy.  She underwent chemoradiation with 8 cycles carboplatin and paclitaxel. She received 63 Gy of radiation. Treatment was completed in late January. She then underwent consolidation chemotherapy for another 3 cycles. A recent CT showed a partial response. She tolerated treatment well. She did lose about 20 pounds with the initial treatment, but did not have any weight loss with the second round of chemo. Last week she had urgent cholecystectomy because of increasing gallbladder symptoms. She didn't have some trouble with hypoxia postoperatively but this resolved.   Current Activity/ Functional Status:  Patient is independent with mobility/ambulation, transfers, ADL's, IADL's.   Zubrod Score: At the time of surgery this patient's most appropriate activity status/level should be described as: []     0    Normal activity, no  symptoms [x]     1    Restricted in physical strenuous activity but ambulatory, able to do out light work []     2    Ambulatory and capable of self care, unable to do work activities, up and about               >50 % of waking hours                              []     3    Only limited self care, in bed greater than 50% of waking hours []     4    Completely disabled, no self care, confined to bed or chair []     5    Moribund   Past Medical History  Diagnosis Date  . 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 Status post radiation therapy 10/1999  . GERD (gastroesophageal reflux disease)   . Hypothyroidism   . IBS (irritable bowel syndrome)     Hx: of  . COPD (chronic obstructive pulmonary disease)   . Shortness of breath     Hx: of with exertion  . Bronchitis     Hx: of  . Hay fever     Hx: of  . Seizures     Hx: of over 40 years ago  . H/O hiatal hernia   .  History of radiation therapy 01/28/13-03/23/13    63 gray to right upper lobe  . 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 Lung cancer, upper lobe dx'd 12/2012    chemo and XRt complete  . Anxiety     Past Surgical History  Procedure Laterality Date  . Laser ablation  04/2004    for VIN III  . Colonoscopy w/ biopsies and polypectomy      Hx: of  . Tubal ligation    . Video bronchoscopy with endobronchial navigation N/A 12/31/2012    Procedure: VIDEO BRONCHOSCOPY WITH ENDOBRONCHIAL NAVIGATION;  Surgeon: Melrose Nakayama, MD;  Location: [facility];  Service: Thoracic;  Laterality: N/A;  . Video bronchoscopy with endobronchial ultrasound N/A 12/31/2012    Procedure: VIDEO BRONCHOSCOPY WITH ENDOBRONCHIAL ULTRASOUND;  Surgeon: Melrose Nakayama, MD;  Location: [facility];  Service: Thoracic;  Laterality: N/A;  . Cholecystectomy N/A 08/03/2013    Procedure: LAPAROSCOPIC CHOLECYSTECTOMY;  Surgeon: Harl Bowie, MD;  Location: [facility];  Service: General;  Laterality: N/A;    Family History    Problem Relation Age of Onset  . Heart disease Father   . Cervical cancer Sister   . Lung cancer Brother   . Heart disease Brother   . Cancer Brother     small cell lung cancer    History   Social History  . Marital Status: Married    Spouse Name: N/A    Number of Children: N/A  . Years of Education: N/A   Occupational History  . Not on file.   Social History Main Topics  . Smoking status: Former Smoker -- 1.00 packs/day for 34 years    Start date: 02/25/1998    Quit date: 02/25/1998  . Smokeless tobacco: Never Used  . Alcohol Use: No  . Drug Use: No  . Sexual Activity: Not on file   Other Topics Concern  . Not on file   Social History Narrative  . No narrative on file    History  Smoking status  . Former Smoker -- 1.00 packs/day for 34 years  . Start date: 02/25/1998  . Quit date: 02/25/1998  Smokeless tobacco  . Never Used    History  Alcohol Use No     Allergies  Allergen Reactions  . Latex Dermatitis  . Codeine Nausea Only    Current Outpatient Prescriptions  Medication Sig Dispense Refill  . albuterol (PROVENTIL HFA;VENTOLIN HFA) 108 (90 BASE) MCG/ACT inhaler Inhale 1 puff into the lungs every 6 (six) hours as needed for wheezing.      Marland Kitchen ALPRAZolam (XANAX) 0.5 MG tablet Take 0.25 mg by mouth 2 (two) times daily as needed for anxiety.      . Cholecalciferol (VITAMIN D-3) 5000 UNITS TABS Take 5,000 Units by mouth 2 (two) times a week.      . clidinium-chlordiazePOXIDE (LIBRAX) 5-2.5 MG per capsule Take 1 capsule by mouth daily as needed (for upset stomach).      . esomeprazole (NEXIUM) 40 MG capsule Take 40 mg by mouth daily before breakfast.      . gabapentin (NEURONTIN) 100 MG capsule Take 1 capsule (100 mg total) by mouth 3 (three) times daily.  90 capsule  1  . HYDROcodone-acetaminophen (NORCO) 5-325 MG per tablet Take 1-2 tablets by mouth every 6 (six) hours as needed.  40 tablet  0  . levothyroxine (SYNTHROID, LEVOTHROID) 112 MCG tablet Take  112 mcg by mouth daily before breakfast.      .  ondansetron (ZOFRAN) 4 MG tablet       . oxyCODONE-acetaminophen (PERCOCET/ROXICET) 5-325 MG per tablet       . sucralfate (CARAFATE) 1 GM/10ML suspension Take 1 g by mouth 4 (four) times daily -  with meals and at bedtime.       No current facility-administered medications for this visit.     Review of Systems:     Cardiac Review of Systems: Y or N  Chest Pain [ n   ]  Resting SOB [ n  ] Exertional SOB  [ y ]  Orthopnea [ n ]   Pedal Edema [ n  ]    Palpitations [  n] Syncope  [ n ]   Presyncope [ n  ]  General Review of Systems: [Y] = yes [  ]=no Constitional: recent weight change [ n ];  Wt loss over the last 3 months [   ] anorexia Blue.Reese  ]; fatigue [ y ]; nausea [  ]; night sweats [n  ]; fever [ n ]; or chills [ n ];          Dental: poor dentition[  ]; Last Dentist visit:   Eye : blurred vision [  ]; diplopia [   ]; vision changes [  ];  Amaurosis fugax[  ]; Resp: cough [n  ];  wheezing[n  ];  hemoptysis[ n ]; shortness of breath[y  ]; paroxysmal nocturnal dyspnea[  ]; dyspnea on exertion[  ]; or orthopnea[  ];  GI:  gallstones[  ], vomiting[  ];  dysphagia[  ]; melena[  ];  hematochezia [  ]; heartburn[  ];   Hx of  Colonoscopy[  ]; GU: kidney stones [  ]; hematuria[  ];   dysuria [  ];  nocturia[  ];  history of     obstruction [  ]; urinary frequency [  ]             Skin: rash, swelling[  ];, hair loss[  ];  peripheral edema[  ];  or itching[  ]; Musculosketetal: myalgias[  ];  joint swelling[  ];  joint erythema[  ];  joint pain[  ];  back pain[  ];  Heme/Lymph: bruising[  ];  bleeding[  ];  anemia[  ];  Neuro: TIA[  ];  headaches[  ];  stroke[  ];  vertigo[  ];  seizures[  ];   paresthesias[  ];  difficulty walking[  ];  Psych:depression[  ]; anxiety[  ];  Endocrine: diabetes[  ];  thyroid dysfunction[  ];  Immunizations: Flu up to date [?  ]; Pneumococcal up to date [?  ];  Other:  Physical Exam: BP 130/72  Pulse 100  Resp 20   Ht 5\' 8"  (1.727 m)  Wt 177 lb (80.287 kg)  BMI 26.92 kg/m2  SpO2 %  PHYSICAL EXAMINATION:  General appearance: alert and cooperative Neurologic: intact Heart: regular rate and rhythm, S1, S2 normal, no murmur, click, rub or gallop Lungs: clear to auscultation bilaterally, no wheezing on exam Abdomen: soft, non-tender; bowel sounds normal; no masses,  no organomegaly Extremities: extremities normal, atraumatic, no cyanosis or edema and Homans sign is negative, no sign of DVT Patient has no cervical or supraclavicular adenopathy  Diagnostic Studies & Laboratory data:     Recent Radiology Findings:  Mr Kizzie Fantasia Contrast  08/19/2013   CLINICAL DATA:  Lung cancer staging  EXAM: MRI HEAD WITHOUT AND WITH CONTRAST  TECHNIQUE: Multiplanar,  multiecho pulse sequences of the brain and surrounding structures were obtained without and with intravenous contrast.  CONTRAST:  69mL MULTIHANCE GADOBENATE DIMEGLUMINE 529 MG/ML IV SOLN  COMPARISON:  MRI head 01/20/2013  FINDINGS: Ventricle size is normal. Craniocervical junction is normal. Pituitary normal in size. No skull lesions are identified.  Scattered small white matter hyperintensities are similar to the prior MRI and consistent with chronic microvascular ischemia. No acute infarct.  Negative for hemorrhage or mass. No enhancing lesions identified following contrast administration.  IMPRESSION: No acute abnormality.  Negative for metastatic disease.   Electronically Signed   By: Franchot Gallo M.D.   On: 08/19/2013 16:41   Nm Pet Image Restag (ps) Skull Base To Thigh  08/20/2013   CLINICAL DATA:  Subsequent treatment strategy for lung cancer.  EXAM: NUCLEAR MEDICINE PET SKULL BASE TO THIGH  TECHNIQUE: 10.0 mCi F-18 FDG was injected intravenously. Full-ring PET imaging was performed from the skull base to thigh after the radiotracer. CT data was obtained and used for attenuation correction and anatomic localization.  FASTING BLOOD GLUCOSE:  Value: 93  mg/dl  COMPARISON:  PET-CT 12/17/2012  FINDINGS: NECK  No hypermetabolic lymph nodes in the neck.  CHEST  Hypermetabolic nodule in the right upper lobe is decreased in size and metabolic activity measuring 10 mm compared to 17 mm and with SUV max 2.5 decreased from 27.9. There is interval resolution of metabolic activity associated with mediastinal lymph nodes.  There is mild metabolic activity associated with the esophagus in the subcarinal location which correlates with radiation inflammation in the esophagus. No new pulmonary nodules.  ABDOMEN/PELVIS  No abnormal hypermetabolic activity within the liver, pancreas, adrenal glands, or spleen. No hypermetabolic lymph nodes in the abdomen or pelvis.  SKELETON  No focal hypermetabolic activity to suggest skeletal metastasis. Photopenia within the thoracic spine marrow consistent radiation change. Resolution of metabolic activity in the anterior right rib fracture.  IMPRESSION: 1. Interval reduction in size and metabolic activity of right upper lobe hypermetabolic nodule. 2. Resolution of metabolic activity of the mediastinal lymph nodes. 3. No evidence of new disease. 4. Radiation related activity in the thoracic spine and mid esophagus.   Electronically Signed   By: Suzy Bouchard M.D.   On: 08/20/2013 13:38   Dg Chest 2 View  07/30/2013   CLINICAL DATA:  Preoperative cholecystectomy; cough and shortness of breast; history of previous lung carcinoma  EXAM: CHEST  2 VIEW  COMPARISON:  Chest CT Jul 12, 2013  FINDINGS: Port-A-Cath tip is at the cavoatrial junction. No pneumothorax. There is underlying emphysematous change. There is chronic inflammatory type change in the lungs diffusely. There is no frank edema or consolidation. The spiculated area in the right upper lobe seen on recent CT is not well seen on radiographic examination.  Heart size is normal. Pulmonary vascularity reflects underlying emphysema. No adenopathy. No bone lesions.  IMPRESSION: Underlying  emphysema with chronic inflammatory type change. A spiculated lesion in the right upper lobe seen on recent CT is not well seen on this study. There is no consolidation. No adenopathy. No pneumothorax.   Electronically Signed   By: Lowella Grip M.D.   On: 07/30/2013 15:02   Ct Chest W Contrast  07/12/2013   CLINICAL DATA:  Lung cancer restaging. Chemotherapy and radiation therapy complete.  EXAM: CT CHEST WITH CONTRAST  TECHNIQUE: Multidetector CT imaging of the chest was performed during intravenous contrast administration.  CONTRAST:  4mL OMNIPAQUE IOHEXOL 300 MG/ML  SOLN  COMPARISON:  04/23/2013 and 11/17/2012.  PET 12/17/2012.  FINDINGS: Right IJ Port-A-Cath terminates in the high right atrium. Heart size normal. No pericardial effusion. Mediastinal lymph nodes are not enlarged by CT size criteria. No hilar or axillary adenopathy. Atherosclerotic calcification of the arterial vasculature, including involvement of the coronary arteries.  Biapical pleural parenchymal scarring. Moderate centrilobular emphysema. Spiculated nodule in the right upper lobe measures 8 x 12 mm, stable (image 26). Scattered mild peribronchovascular nodularity is unchanged. Mild mosaic attenuation in the lungs, as before. No pleural fluid. Airway is unremarkable.  Incidental imaging of the upper abdomen shows the visualized portion of the liver, gallbladder and right adrenal glands are unremarkable. Left adrenal nodule measures 1.2 x 1.3 cm and 30 Hounsfield units, unchanged from 11/17/2012 and not shown to be hypermetabolic on 36/64/4034. Visualized portions of the kidneys, spleen, pancreas, stomach and bowel are otherwise unremarkable. No upper abdominal adenopathy. No worrisome lytic or sclerotic lesions.  IMPRESSION: 1. Spiculated right upper lobe nodule is stable. 2. Coronary artery calcification. 3. Left adrenal adenoma.   Electronically Signed   By: Lorin Picket M.D.   On: 07/12/2013 15:01   Dg Chest Port 1  View  08/03/2013   CLINICAL DATA:  68 year old female with shortness of Breath status post cholecystectomy. Hypoxia. Initial encounter.  EXAM: PORTABLE CHEST - 1 VIEW  COMPARISON:  07/30/2013 and earlier.  FINDINGS: Portable AP semi upright view at a 1558 hrs. Stable right chest porta cath. Stable cardiac size and mediastinal contours. Visualized tracheal air column is within normal limits. No pneumothorax. Mild crowding of lower lobe markings, otherwise stable increased interstitial opacity which is chronic. No pulmonary edema, pleural effusion or consolidation.  IMPRESSION: Chronic interstitial lung changes. Lower lung volumes now with mild atelectasis.   Electronically Signed   By: Lars Pinks M.D.   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  Full pulmonary function studies show very limited pulmonary function FEV1 43% and diffusion 29%  I have discussed pros and cons of surgical resection with the patient and husband. With here good response  to current treatment and very limited reserve pulmonary  I would not  recommend proceeding with surgical resection in the setting  of known IIIa disease.        Grace Isaac MD      [place] Mercer,Dawn Mercer Office  [phone]   Beeper [phone]  09/03/2013 4:16 PM

## 2013-10-04 ENCOUNTER — Ambulatory Visit (HOSPITAL_BASED_OUTPATIENT_CLINIC_OR_DEPARTMENT_OTHER): Payer: Medicare Other

## 2013-10-04 ENCOUNTER — Ambulatory Visit (HOSPITAL_COMMUNITY)
Admission: RE | Admit: 2013-10-04 | Discharge: 2013-10-04 | Disposition: A | Discharge status: Home / Self Care | Payer: Medicare Other | Source: Ambulatory Visit | Attending: Internal Medicine | Admitting: Internal Medicine

## 2013-10-04 ENCOUNTER — Other Ambulatory Visit (HOSPITAL_BASED_OUTPATIENT_CLINIC_OR_DEPARTMENT_OTHER): Payer: Medicare Other

## 2013-10-04 DIAGNOSIS — Z923 Personal history of irradiation: Secondary | ICD-10-CM | POA: Insufficient documentation

## 2013-10-04 DIAGNOSIS — Z9221 Personal history of antineoplastic chemotherapy: Secondary | ICD-10-CM | POA: Diagnosis not present

## 2013-10-04 DIAGNOSIS — C3410 Malignant neoplasm of upper lobe, unspecified bronchus or lung: Secondary | ICD-10-CM

## 2013-10-04 DIAGNOSIS — E278 Other specified disorders of adrenal gland: Secondary | ICD-10-CM | POA: Diagnosis not present

## 2013-10-04 DIAGNOSIS — Z95828 Presence of other vascular implants and grafts: Secondary | ICD-10-CM

## 2013-10-04 DIAGNOSIS — R911 Solitary pulmonary nodule: Secondary | ICD-10-CM | POA: Diagnosis not present

## 2013-10-04 DIAGNOSIS — Z452 Encounter for adjustment and management of vascular access device: Secondary | ICD-10-CM

## 2013-10-04 DIAGNOSIS — C3490 Malignant neoplasm of unspecified part of unspecified bronchus or lung: Secondary | ICD-10-CM | POA: Insufficient documentation

## 2013-10-04 DIAGNOSIS — J438 Other emphysema: Secondary | ICD-10-CM | POA: Diagnosis not present

## 2013-10-04 LAB — CBC WITH DIFFERENTIAL/PLATELET
BASO%: 0.3 % (ref 0.0–2.0)
BASOS ABS: 0 10*3/uL (ref 0.0–0.1)
EOS ABS: 0.1 10*3/uL (ref 0.0–0.5)
EOS%: 3.2 % (ref 0.0–7.0)
HEMATOCRIT: 35.4 % (ref 34.8–46.6)
HEMOGLOBIN: 11.2 g/dL — AB (ref 11.6–15.9)
LYMPH%: 24.7 % (ref 14.0–49.7)
MCH: 30.2 pg (ref 25.1–34.0)
MCHC: 31.6 g/dL (ref 31.5–36.0)
MCV: 95.4 fL (ref 79.5–101.0)
MONO#: 0.2 10*3/uL (ref 0.1–0.9)
MONO%: 6 % (ref 0.0–14.0)
NEUT#: 2.3 10*3/uL (ref 1.5–6.5)
NEUT%: 65.8 % (ref 38.4–76.8)
Platelets: 151 10*3/uL (ref 145–400)
RBC: 3.71 10*6/uL (ref 3.70–5.45)
RDW: 15.4 % — ABNORMAL HIGH (ref 11.2–14.5)
WBC: 3.5 10*3/uL — ABNORMAL LOW (ref 3.9–10.3)
lymph#: 0.9 10*3/uL (ref 0.9–3.3)

## 2013-10-04 LAB — COMPREHENSIVE METABOLIC PANEL
ALBUMIN: 3.4 g/dL — AB (ref 3.5–5.2)
ALT: 9 U/L (ref 0–35)
AST: 16 U/L (ref 0–37)
Alkaline Phosphatase: 107 U/L (ref 39–117)
BUN: 13 mg/dL (ref 6–23)
CO2: 26 mEq/L (ref 19–32)
CREATININE: 0.81 mg/dL (ref 0.50–1.10)
Calcium: 9.4 mg/dL (ref 8.4–10.5)
Chloride: 101 mEq/L (ref 96–112)
GLUCOSE: 86 mg/dL (ref 70–99)
POTASSIUM: 4.5 meq/L (ref 3.5–5.3)
Sodium: 139 mEq/L (ref 135–145)
Total Bilirubin: 0.6 mg/dL (ref 0.2–1.2)
Total Protein: 7.6 g/dL (ref 6.0–8.3)

## 2013-10-04 MED ORDER — IOHEXOL 300 MG/ML  SOLN
80.0000 mL | Freq: Once | INTRAMUSCULAR | Status: AC | PRN
  Administered 2013-10-04: 80 mL via INTRAVENOUS

## 2013-10-04 MED ORDER — HEPARIN SOD (PORK) LOCK FLUSH 100 UNIT/ML IV SOLN
500.0000 [IU] | Freq: Once | INTRAVENOUS | Status: AC
Start: 2013-10-04 — End: 2013-10-04
  Administered 2013-10-04: 500 [IU] via INTRAVENOUS
  Filled 2013-10-04: qty 5

## 2013-10-04 MED ORDER — SODIUM CHLORIDE 0.9 % IJ SOLN
10.0000 mL | INTRAMUSCULAR | Status: DC | PRN
  Administered 2013-10-04: 10 mL via INTRAVENOUS
  Filled 2013-10-04: qty 10

## 2013-10-04 NOTE — Patient Instructions (Signed)

## 2013-10-11 ENCOUNTER — Telehealth: Payer: Self-pay | Admitting: Internal Medicine

## 2013-10-11 ENCOUNTER — Encounter: Payer: Self-pay | Admitting: Internal Medicine

## 2013-10-11 ENCOUNTER — Ambulatory Visit (HOSPITAL_BASED_OUTPATIENT_CLINIC_OR_DEPARTMENT_OTHER): Payer: Medicare Other | Admitting: Internal Medicine

## 2013-10-11 VITALS — BP 110/61 | HR 100 | Temp 98.1°F | Resp 18 | Ht 68.0 in | Wt 172.1 lb

## 2013-10-11 DIAGNOSIS — C3410 Malignant neoplasm of upper lobe, unspecified bronchus or lung: Secondary | ICD-10-CM

## 2013-10-11 NOTE — Progress Notes (Signed)
[facility] [phone]   [phone]  OFFICE PROGRESS NOTE  [name], MD [phone] [address], [address] [place] [address]  DIAGNOSIS: Stage IIIA (T1b., N2, M0) non-small cell lung cancer, squamous cell carcinoma presented with right upper lobe lung nodule in addition to mediastinal lymphadenopathy diagnosed in November of 2014.  PRIOR THERAPY:  1) Concurrent chemoradiation with weekly carboplatin for AUC of 2 and paclitaxel 45 mg/M2, status post 8 cycles, last dose was given 03/15/2013 with partial response. 2) Consolidation chemotherapy with carboplatin for AUC of 5 and paclitaxel 175 mg/M2 every 3 weeks with Neulasta support, status post 3 cycles. First dose 05/03/2013.   CURRENT THERAPY: Observation.  CHEMOTHERAPY INTENT: Control/curative  CURRENT # OF CHEMOTHERAPY CYCLES: 0  CURRENT ANTIEMETICS: Zofran, dexamethasone and Compazine  CURRENT SMOKING STATUS: Former smoker.  ORAL CHEMOTHERAPY AND CONSENT: None  CURRENT BISPHOSPHONATES USE: None  PAIN MANAGEMENT: 0/10  NARCOTICS INDUCED CONSTIPATION: None  LIVING WILL AND CODE STATUS: Full code   INTERVAL HISTORY: QUANTISHA MARSICANO 68 y.o. female returns to the clinic today for followup visit accompanied by her husband. She has been on observation for the last few months with no significant complaints. She denied having any significant chest pain, shortness of breath, cough or hemoptysis. She denied having any nausea or vomiting. She has no significant weight loss or night sweats. She had repeat CT scan of the chest performed recently and she is here for evaluation and discussion of her scan results.  MEDICAL HISTORY: Past Medical History  Diagnosis Date  . 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 Status post radiation therapy 10/1999  . GERD (gastroesophageal reflux disease)   . Hypothyroidism   . IBS (irritable bowel syndrome)     Hx: of  . COPD  (chronic obstructive pulmonary disease)   . Shortness of breath     Hx: of with exertion  . Bronchitis     Hx: of  . Hay fever     Hx: of  . Seizures     Hx: of over 40 years ago  . H/O hiatal hernia   . History of radiation therapy 01/28/13-03/23/13    63 gray to right upper lobe  . 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 Lung cancer, upper lobe dx'd 12/2012    chemo and XRt complete  . Anxiety     ALLERGIES:  is allergic to latex and codeine.  MEDICATIONS:  Current Outpatient Prescriptions  Medication Sig Dispense Refill  . albuterol (PROVENTIL HFA;VENTOLIN HFA) 108 (90 BASE) MCG/ACT inhaler Inhale 1 puff into the lungs every 6 (six) hours as needed for wheezing.      Marland Kitchen ALPRAZolam (XANAX) 0.5 MG tablet Take 0.25 mg by mouth 2 (two) times daily as needed for anxiety.      . Cholecalciferol (VITAMIN D-3) 5000 UNITS TABS Take 5,000 Units by mouth 2 (two) times a week.      . clidinium-chlordiazePOXIDE (LIBRAX) 5-2.5 MG per capsule Take 1 capsule by mouth daily as needed (for upset stomach).      . esomeprazole (NEXIUM) 40 MG capsule Take 40 mg by mouth daily before breakfast.      . gabapentin (NEURONTIN) 100 MG capsule Take 1 capsule (100 mg total) by mouth 3 (three) times daily.  90 capsule  1  . HYDROcodone-acetaminophen (NORCO) 5-325 MG per tablet Take 1-2 tablets by mouth every 6 (six) hours as needed.  40 tablet  0  . levothyroxine (SYNTHROID, LEVOTHROID) 112 MCG tablet Take 112 mcg by mouth daily before breakfast.      . ondansetron (ZOFRAN) 4 MG tablet       . sucralfate (CARAFATE) 1 GM/10ML suspension Take 1 g by mouth 4 (four) times daily -  with meals and at bedtime.       No current facility-administered medications for this visit.    SURGICAL HISTORY:  Past Surgical History  Procedure Laterality Date  . Laser ablation  04/2004    for VIN III  . Colonoscopy w/ biopsies and polypectomy      Hx: of  . Tubal ligation    . Video bronchoscopy  with endobronchial navigation N/A 12/31/2012    Procedure: VIDEO BRONCHOSCOPY WITH ENDOBRONCHIAL NAVIGATION;  Surgeon: Melrose Nakayama, MD;  Location: [facility];  Service: Thoracic;  Laterality: N/A;  . Video bronchoscopy with endobronchial ultrasound N/A 12/31/2012    Procedure: VIDEO BRONCHOSCOPY WITH ENDOBRONCHIAL ULTRASOUND;  Surgeon: Melrose Nakayama, MD;  Location: [facility];  Service: Thoracic;  Laterality: N/A;  . Cholecystectomy N/A 08/03/2013    Procedure: LAPAROSCOPIC CHOLECYSTECTOMY;  Surgeon: Harl Bowie, MD;  Location: [facility];  Service: General;  Laterality: N/A;    REVIEW OF SYSTEMS:  Constitutional: negative Eyes: negative Ears, nose, mouth, throat, and face: negative Respiratory: negative Cardiovascular: negative Gastrointestinal: negative Genitourinary:negative Integument/breast: negative Hematologic/lymphatic: negative Musculoskeletal:negative Neurological: negative Behavioral/Psych: negative Endocrine: negative Allergic/Immunologic: negative   PHYSICAL EXAMINATION: General appearance: alert, cooperative and no distress Head: Normocephalic, without obvious abnormality, atraumatic Neck: no adenopathy, no JVD, supple, symmetrical, trachea midline and thyroid not enlarged, symmetric, no tenderness/mass/nodules Lymph nodes: Cervical, supraclavicular, and axillary nodes normal. Resp: clear to auscultation bilaterally Back: symmetric, no curvature. ROM normal. No CVA tenderness. Cardio: regular rate and rhythm, S1, S2 normal, no murmur, click, rub or gallop GI: soft, non-tender; bowel sounds normal; no masses,  no organomegaly Extremities: extremities normal, atraumatic, no cyanosis or edema Neurologic: Alert and oriented X 3, normal strength and tone. Normal symmetric reflexes. Normal coordination and gait  ECOG PERFORMANCE STATUS: 1 - Symptomatic but completely ambulatory  Blood pressure 110/61, pulse 100, temperature 98.1 F (36.7 C), temperature source Oral,  resp. rate 18, height 5\' 8"  (1.727 m), weight 172 lb 1.6 oz (78.064 kg), SpO2 85.00%.  LABORATORY DATA: Lab Results  Component Value Date   WBC 3.5* 10/04/2013   HGB 11.2* 10/04/2013   HCT 35.4 10/04/2013   MCV 95.4 10/04/2013   PLT 151 10/04/2013      Chemistry      Component Value Date/Time   NA 139 10/04/2013 1051   NA 143 07/12/2013 1036   K 4.5 10/04/2013 1051   K 4.4 07/12/2013 1036   CL 101 10/04/2013 1051   CO2 26 10/04/2013 1051   CO2 25 07/12/2013 1036   BUN 13 10/04/2013 1051   BUN 11.3 07/12/2013 1036   CREATININE 0.81 10/04/2013 1051   CREATININE 0.8 07/12/2013 1036      Component Value Date/Time   CALCIUM 9.4 10/04/2013 1051   CALCIUM 9.4 07/12/2013 1036   ALKPHOS 107 10/04/2013 1051   ALKPHOS 144 07/12/2013 1036   AST 16 10/04/2013 1051   AST 16 07/12/2013 1036   ALT 9 10/04/2013 1051   ALT 25 07/12/2013 1036   BILITOT 0.6 10/04/2013 1051   BILITOT 0.82 07/12/2013 1036       RADIOGRAPHIC STUDIES: Ct Chest W Contrast  10/04/2013   CLINICAL DATA:  Lung  cancer restaging. Chemotherapy and radiation complete.  EXAM: CT CHEST WITH CONTRAST  TECHNIQUE: Multidetector CT imaging of the chest was performed during intravenous contrast administration.  CONTRAST:  70mL OMNIPAQUE IOHEXOL 300 MG/ML  SOLN  COMPARISON:  PET CT 08/20/2013.  Chest CT 07/12/2013.  FINDINGS: Right chest wall Port-A-Cath is in place with the tip near the cavoatrial junction. No mediastinal, hilar, or axillary adenopathy. Stable small subcarinal lymph node measuring 7 mm in short axis diameter. Heart is normal size. Aorta is normal caliber.  Moderate centrilobular emphysema. Spiculated right upper lobe pulmonary nodule measures 9 mm in diameter compare with 10 mm previously. Tenting of the adjacent pleura. No new pulmonary nodules or pleural effusions.  Imaging into the upper abdomen shows no acute findings. 13 mm nodular area within the left adrenal gland is stable since prior PET CT when this measured low-density and is  therefore most compatible with adenoma. Prior cholecystectomy.  IMPRESSION: Stable or slight further decrease in the size of the spiculated right upper lobe pulmonary nodule, 9 mm compared with 10 mm previously. Adjacent pleural tag/thickening. This is stable.  Moderate to severe centrilobular emphysema.   Electronically Signed   By: Rolm Baptise M.D.   On: 10/04/2013 13:02   ASSESSMENT AND PLAN: This is a very pleasant 68 years old white female recently diagnosed with a stage IIIA non-small cell lung cancer currently undergoing concurrent chemoradiation with weekly carboplatin and paclitaxel status post 8 weekly treatment followed by consolidation chemotherapy with carboplatin and paclitaxel status post 3 cycles. She tolerated her last cycle of chemotherapy fairly well with no significant adverse effects. Her recent CT scan of the chest showed no evidence for disease progression. I discussed the scan results with the patient and her husband. I recommended for her to continue on observation. I would see the patient back for followup visit in 3 months after repeating CT scan of the chest. She was advised to call immediately if she has any concerning symptoms in the interval.  The patient voices understanding of current disease status and treatment options and is in agreement with the current care plan.  All questions were answered. The patient knows to call the clinic with any problems, questions or concerns. We can certainly see the patient much sooner if necessary.  Disclaimer: This note was dictated with voice recognition software. Similar sounding words can inadvertently be transcribed and may not be corrected upon review.

## 2013-10-11 NOTE — Telephone Encounter (Signed)
Pt confirmed labs/ov per 08/17 POF, gave pt AVS.....KJ °

## 2013-10-14 ENCOUNTER — Ambulatory Visit
Admission: RE | Admit: 2013-10-14 | Discharge: 2013-10-14 | Disposition: A | Discharge status: Home / Self Care | Payer: Medicare Other | Source: Ambulatory Visit | Attending: Radiation Oncology | Admitting: Radiation Oncology

## 2013-10-14 ENCOUNTER — Encounter: Payer: Self-pay | Admitting: Radiation Oncology

## 2013-10-14 VITALS — BP 125/85 | HR 82 | Temp 98.1°F | Ht 68.0 in | Wt 172.7 lb

## 2013-10-14 DIAGNOSIS — C3410 Malignant neoplasm of upper lobe, unspecified bronchus or lung: Secondary | ICD-10-CM

## 2013-10-14 MED ORDER — HYDROCODONE-ACETAMINOPHEN 5-325 MG PO TABS: 1.0000 | ORAL_TABLET | Freq: Four times a day (QID) | ORAL | Status: DC | PRN

## 2013-10-14 MED ORDER — ALPRAZOLAM 0.5 MG PO TABS: 0.2500 mg | ORAL_TABLET | Freq: Two times a day (BID) | ORAL | Status: DC | PRN

## 2013-10-14 NOTE — Progress Notes (Signed)
Radiation Oncology         [phone]) [phone] ________________________________  Name: KYNNEDY CARRENO MRN: [id number]  Date: 10/14/2013  DOB: [birth year]  Follow-Up Visit Note  CC: [name], MD  Antony Blackbird, MD  Diagnosis:   : Stage IIIA (T1b., N2, M0) non-small cell lung cancer, squamous cell carcinoma presented with right upper lobe lung nodule  Interval Since Last Radiation:  7  months  Narrative:  The patient returns today for routine follow-up.  She is doing well at this time. She has mild pain in the right lateral chest area which she takes a half a tablet of hydrocodone on occasion. She denies any hemoptysis or breathing problems. She continues on an inhaler. She occasionally will wake up at night with coughing episode but clears after she uses her inhaler. She underwent a recent chest CT scan which is documented below without any new problems and further shrinkage of her right upper lobe lesion. She denies any new bony pain headaches dizziness or blurred vision.                              ALLERGIES:  is allergic to latex and codeine.  Meds: Current Outpatient Prescriptions  Medication Sig Dispense Refill  . albuterol (PROVENTIL HFA;VENTOLIN HFA) 108 (90 BASE) MCG/ACT inhaler Inhale 1 puff into the lungs every 6 (six) hours as needed for wheezing.      Marland Kitchen ALPRAZolam (XANAX) 0.5 MG tablet Take 0.25 mg by mouth 2 (two) times daily as needed for anxiety.      . Cholecalciferol (VITAMIN D-3) 5000 UNITS TABS Take 5,000 Units by mouth 2 (two) times a week.      . esomeprazole (NEXIUM) 40 MG capsule Take 40 mg by mouth daily before breakfast.      . gabapentin (NEURONTIN) 100 MG capsule Take 1 capsule (100 mg total) by mouth 3 (three) times daily.  90 capsule  1  . HYDROcodone-acetaminophen (NORCO) 5-325 MG per tablet Take 1-2 tablets by mouth every 6 (six) hours as needed.  40 tablet  0  . levothyroxine (SYNTHROID, LEVOTHROID) 112 MCG tablet Take 112 mcg by mouth daily before  breakfast.      . sucralfate (CARAFATE) 1 GM/10ML suspension Take 1 g by mouth 4 (four) times daily -  with meals and at bedtime.      . clidinium-chlordiazePOXIDE (LIBRAX) 5-2.5 MG per capsule Take 1 capsule by mouth daily as needed (for upset stomach).      . ondansetron (ZOFRAN) 4 MG tablet        No current facility-administered medications for this encounter.    Physical Findings: The patient is in no acute distress. Patient is alert and oriented.  height is 5\' 8"  (1.727 m) and weight is 172 lb 11.2 oz (78.336 kg). Her oral temperature is 98.1 F (36.7 C). Her blood pressure is 125/85 and her pulse is 82. Her oxygen saturation is 93%. .  No palpable supraclavicular or axillary adenopathy. The lungs are clear to auscultation. The heart has regular rhythm and rate. No wheezing is noted.  Lab Findings: Lab Results  Component Value Date   WBC 3.5* 10/04/2013   HGB 11.2* 10/04/2013   HCT 35.4 10/04/2013   MCV 95.4 10/04/2013   PLT 151 10/04/2013      Radiographic Findings: Ct Chest W Contrast  10/04/2013   CLINICAL DATA:  Lung cancer restaging. Chemotherapy and radiation complete.  EXAM: CT CHEST  WITH CONTRAST  TECHNIQUE: Multidetector CT imaging of the chest was performed during intravenous contrast administration.  CONTRAST:  60mL OMNIPAQUE IOHEXOL 300 MG/ML  SOLN  COMPARISON:  PET CT 08/20/2013.  Chest CT 07/12/2013.  FINDINGS: Right chest wall Port-A-Cath is in place with the tip near the cavoatrial junction. No mediastinal, hilar, or axillary adenopathy. Stable small subcarinal lymph node measuring 7 mm in short axis diameter. Heart is normal size. Aorta is normal caliber.  Moderate centrilobular emphysema. Spiculated right upper lobe pulmonary nodule measures 9 mm in diameter compare with 10 mm previously. Tenting of the adjacent pleura. No new pulmonary nodules or pleural effusions.  Imaging into the upper abdomen shows no acute findings. 13 mm nodular area within the left adrenal gland  is stable since prior PET CT when this measured low-density and is therefore most compatible with adenoma. Prior cholecystectomy.  IMPRESSION: Stable or slight further decrease in the size of the spiculated right upper lobe pulmonary nodule, 9 mm compared with 10 mm previously. Adjacent pleural tag/thickening. This is stable.  Moderate to severe centrilobular emphysema.   Electronically Signed   By: Rolm Baptise M.D.   On: 10/04/2013 13:02    Impression:  Clinically stable with recent chest CT scan showing no new problems with continued shrinkage of the right upper lobe spiculated lesion.  Plan:  Routine followup in late November after her CT scan.  ____________________________________ Blair Promise, MD

## 2013-10-14 NOTE — Progress Notes (Signed)
Dawn Mercer here for follow up to her right lung.  She reports that she continues to having aching pain in her right side.  She rates it at a 3/10.  She is asking for a refill on hydrocodone.  She report having a cough at night with occasional white sputum.  She reports shortness of breath and uses albuterol as needed.  Her oxygen saturation on arrival was 88% and went up to 93 percent with deep breathing.  She denies having a sore throat or issues swallowing.  She reports feeling weak.

## 2013-11-15 ENCOUNTER — Ambulatory Visit (HOSPITAL_BASED_OUTPATIENT_CLINIC_OR_DEPARTMENT_OTHER): Payer: Medicare Other

## 2013-11-15 VITALS — BP 103/80 | HR 90 | Temp 97.8°F

## 2013-11-15 DIAGNOSIS — Z452 Encounter for adjustment and management of vascular access device: Secondary | ICD-10-CM

## 2013-11-15 DIAGNOSIS — C3410 Malignant neoplasm of upper lobe, unspecified bronchus or lung: Secondary | ICD-10-CM

## 2013-11-15 DIAGNOSIS — Z95828 Presence of other vascular implants and grafts: Secondary | ICD-10-CM

## 2013-11-15 MED ORDER — SODIUM CHLORIDE 0.9 % IJ SOLN
10.0000 mL | INTRAMUSCULAR | Status: DC | PRN
  Administered 2013-11-15: 10 mL via INTRAVENOUS
  Filled 2013-11-15: qty 10

## 2013-11-15 MED ORDER — HEPARIN SOD (PORK) LOCK FLUSH 100 UNIT/ML IV SOLN
500.0000 [IU] | Freq: Once | INTRAVENOUS | Status: AC
Start: 2013-11-15 — End: 2013-11-15
  Administered 2013-11-15: 500 [IU] via INTRAVENOUS
  Filled 2013-11-15: qty 5

## 2013-11-15 NOTE — Patient Instructions (Signed)

## 2013-11-18 ENCOUNTER — Telehealth: Payer: Self-pay | Admitting: *Deleted

## 2013-11-18 NOTE — Telephone Encounter (Signed)
Patient called.  She wanted to update me on her latest CT Chest scan.  I listened as she explained her delight with her recent scan.  It was so great to hear her news.  She is set up for follow up in Nov and she is aware.

## 2013-11-30 ENCOUNTER — Encounter (HOSPITAL_COMMUNITY): Payer: Self-pay | Admitting: Emergency Medicine

## 2013-11-30 ENCOUNTER — Emergency Department (HOSPITAL_COMMUNITY)
Admission: EM | Admit: 2013-11-30 | Discharge: 2013-11-30 | Disposition: A | Discharge status: Home / Self Care | Payer: Medicare Other | Attending: Emergency Medicine | Admitting: Emergency Medicine

## 2013-11-30 DIAGNOSIS — T361X5A Adverse effect of cephalosporins and other beta-lactam antibiotics, initial encounter: Secondary | ICD-10-CM | POA: Diagnosis not present

## 2013-11-30 DIAGNOSIS — Z91040 Latex allergy status: Secondary | ICD-10-CM | POA: Insufficient documentation

## 2013-11-30 DIAGNOSIS — J441 Chronic obstructive pulmonary disease with (acute) exacerbation: Secondary | ICD-10-CM | POA: Insufficient documentation

## 2013-11-30 DIAGNOSIS — K219 Gastro-esophageal reflux disease without esophagitis: Secondary | ICD-10-CM | POA: Diagnosis not present

## 2013-11-30 DIAGNOSIS — L299 Pruritus, unspecified: Secondary | ICD-10-CM | POA: Diagnosis present

## 2013-11-30 DIAGNOSIS — E039 Hypothyroidism, unspecified: Secondary | ICD-10-CM | POA: Diagnosis not present

## 2013-11-30 DIAGNOSIS — Z8541 Personal history of malignant neoplasm of cervix uteri: Secondary | ICD-10-CM | POA: Insufficient documentation

## 2013-11-30 DIAGNOSIS — Z79899 Other long term (current) drug therapy: Secondary | ICD-10-CM | POA: Insufficient documentation

## 2013-11-30 DIAGNOSIS — F419 Anxiety disorder, unspecified: Secondary | ICD-10-CM | POA: Insufficient documentation

## 2013-11-30 DIAGNOSIS — Z923 Personal history of irradiation: Secondary | ICD-10-CM | POA: Diagnosis not present

## 2013-11-30 DIAGNOSIS — T7840XA Allergy, unspecified, initial encounter: Secondary | ICD-10-CM

## 2013-11-30 DIAGNOSIS — Z85118 Personal history of other malignant neoplasm of bronchus and lung: Secondary | ICD-10-CM | POA: Insufficient documentation

## 2013-11-30 DIAGNOSIS — G40909 Epilepsy, unspecified, not intractable, without status epilepticus: Secondary | ICD-10-CM | POA: Diagnosis not present

## 2013-11-30 MED ORDER — DIPHENHYDRAMINE HCL 25 MG PO CAPS
25.0000 mg | ORAL_CAPSULE | Freq: Once | ORAL | Status: DC
  Filled 2013-11-30: qty 1

## 2013-11-30 MED ORDER — PREDNISONE 50 MG PO TABS
60.0000 mg | ORAL_TABLET | Freq: Once | ORAL | Status: AC
  Administered 2013-11-30: 60 mg via ORAL
  Filled 2013-11-30 (×2): qty 1

## 2013-11-30 MED ORDER — DIPHENHYDRAMINE HCL 25 MG PO CAPS
25.0000 mg | ORAL_CAPSULE | Freq: Once | ORAL | Status: AC
  Administered 2013-11-30: 25 mg via ORAL
  Filled 2013-11-30: qty 1

## 2013-11-30 NOTE — ED Notes (Signed)
Dr Zammit at bedside. 

## 2013-11-30 NOTE — ED Notes (Signed)
Pt. Stating "I am no longer itching, I am feeling better and am ready to go home" Dr. Dewayne Hatch notified.

## 2013-11-30 NOTE — ED Notes (Signed)
Dr. Roderic Palau aware of pt. O2 85%. Pt. Still to be discharged.

## 2013-11-30 NOTE — ED Notes (Signed)
Per verbal order from Dr. Roderic Palau, pt. Given one Benadryl 25mg  capsule to take when she gets home. Pt. Verbalized understanding of use of medication.

## 2013-11-30 NOTE — ED Provider Notes (Signed)
CSN: [id number]     Arrival date & time 11/30/13  2115 History  This chart was scribed for Maudry Diego, MD by Delphia Grates, ED Scribe. This patient was seen in room [facility]05/[facility] and the patient's care was started at 9:32 PM.      Chief Complaint  Patient presents with  . Allergic Reaction    Patient is a 68 y.o. female presenting with allergic reaction. The history is provided by the patient. No language interpreter was used.  Allergic Reaction Presenting symptoms: itching   Presenting symptoms: no difficulty breathing, no difficulty swallowing, no rash and no swelling   Itching:    Location:  Arm   Severity:  Moderate   Onset quality:  Sudden   Duration:  1 day   Timing:  Constant   Progression:  Unchanged Severity:  Moderate Prior allergic episodes:  No prior episodes Context: food (tomatotes) and medications (Keflex)   Relieved by:  Nothing Ineffective treatments:  None tried   HPI Comments: Dawn Mercer is a 68 y.o. female who presents to the Emergency Department for an allergic reaction that began last night. Patient was feeling fine prior to going to bed last night, when she began to itch (particularly on her bilateral arms) in the middle of the night. Patient states that the only thing she has eaten was tomatoes and suspects she may be allergic. Patient states she has SOB, but this is due to chemotherapy and radiation for lung cancer. Patient also states she has taken Keflex for approximately 1 week. She has not taken Benadryl for symptom relief. She denies any rashes. Patient has history of COPD, bronchitis, and anxiety.  Past Medical History  Diagnosis Date  . 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 Status post radiation therapy 10/1999  . GERD (gastroesophageal reflux disease)   . Hypothyroidism   . IBS (irritable bowel syndrome)     Hx: of  . COPD (chronic obstructive pulmonary disease)   . Shortness of breath     Hx: of with exertion  .  Bronchitis     Hx: of  . Hay fever     Hx: of  . Seizures     Hx: of over 40 years ago  . H/O hiatal hernia   . History of radiation therapy 01/28/13-03/23/13    63 gray to right upper lobe  . 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 Lung cancer, upper lobe dx'd 12/2012    chemo and XRt complete  . Anxiety    Past Surgical History  Procedure Laterality Date  . Laser ablation  04/2004    for VIN III  . Colonoscopy w/ biopsies and polypectomy      Hx: of  . Tubal ligation    . Video bronchoscopy with endobronchial navigation N/A 12/31/2012    Procedure: VIDEO BRONCHOSCOPY WITH ENDOBRONCHIAL NAVIGATION;  Surgeon: Melrose Nakayama, MD;  Location: [name];  Service: Thoracic;  Laterality: N/A;  . Video bronchoscopy with endobronchial ultrasound N/A 12/31/2012    Procedure: VIDEO BRONCHOSCOPY WITH ENDOBRONCHIAL ULTRASOUND;  Surgeon: Melrose Nakayama, MD;  Location: [facility];  Service: Thoracic;  Laterality: N/A;  . Cholecystectomy N/A 08/03/2013    Procedure: LAPAROSCOPIC CHOLECYSTECTOMY;  Surgeon: Harl Bowie, MD;  Location: [facility];  Service: General;  Laterality: N/A;   Family History  Problem Relation Age of Onset  . Heart disease Father   . Cervical cancer Sister   . Lung  cancer Brother   . Heart disease Brother   . Cancer Brother     small cell lung cancer   History  Substance Use Topics  . Smoking status: Former Smoker -- 1.00 packs/day for 34 years    Start date: 02/25/1998    Quit date: 02/25/1998  . Smokeless tobacco: Never Used  . Alcohol Use: No   OB History   Grav Para Term Preterm Abortions TAB SAB Ect Mult Living                 Review of Systems  Constitutional: Negative for appetite change and fatigue.  HENT: Negative for congestion, ear discharge, sinus pressure and trouble swallowing.   Eyes: Negative for discharge.  Respiratory: Negative for cough.   Cardiovascular: Negative for chest pain.  Gastrointestinal: Negative for  abdominal pain and diarrhea.  Genitourinary: Negative for frequency and hematuria.  Musculoskeletal: Negative for back pain.  Skin: Positive for itching. Negative for rash.  Neurological: Negative for seizures and headaches.  Psychiatric/Behavioral: Negative for hallucinations.      Allergies  Latex and Codeine  Home Medications   Prior to Admission medications   Medication Sig Start Date End Date Taking? Authorizing Provider  albuterol (PROVENTIL HFA;VENTOLIN HFA) 108 (90 BASE) MCG/ACT inhaler Inhale 1 puff into the lungs every 6 (six) hours as needed for wheezing.    Historical Provider, MD  ALPRAZolam [name]) 0.5 MG tablet Take 0.5 tablets (0.25 mg total) by mouth 2 (two) times daily as needed for anxiety. 10/14/13   Blair Promise, MD  Cholecalciferol (VITAMIN D-3) 5000 UNITS TABS Take 5,000 Units by mouth 2 (two) times a week.    Historical Provider, MD  clidinium-chlordiazePOXIDE (LIBRAX) 5-2.5 MG per capsule Take 1 capsule by mouth daily as needed (for upset stomach).    Historical Provider, MD  esomeprazole (NEXIUM) 40 MG capsule Take 40 mg by mouth daily before breakfast.    Historical Provider, MD  gabapentin (NEURONTIN) 100 MG capsule Take 1 capsule (100 mg total) by mouth 3 (three) times daily. 06/30/13   Curt Bears, MD  HYDROcodone-acetaminophen (NORCO) 5-325 MG per tablet Take 1-2 tablets by mouth every 6 (six) hours as needed. 10/14/13   Blair Promise, MD  levothyroxine (SYNTHROID, LEVOTHROID) 112 MCG tablet Take 112 mcg by mouth daily before breakfast.    Historical Provider, MD  ondansetron (ZOFRAN) 4 MG tablet  07/05/13   Historical Provider, MD  sucralfate (CARAFATE) 1 GM/10ML suspension Take 1 g by mouth 4 (four) times daily -  with meals and at bedtime. 03/16/13   Blair Promise, MD   Triage Vitals: BP 104/66  Pulse 107  Temp(Src) 97.7 F (36.5 C)  Resp 28  Ht 5\' 6"  (1.676 m)  Wt 169 lb (76.658 kg)  BMI 27.29 kg/m2  SpO2 90%  Physical Exam  Constitutional:  She is oriented to person, place, and time. She appears well-developed.  HENT:  Head: Normocephalic.  Eyes: Conjunctivae and EOM are normal. No scleral icterus.  Neck: Neck supple. No thyromegaly present.  Cardiovascular: Normal rate and regular rhythm.  Exam reveals no gallop and no friction rub.   No murmur heard. Pulmonary/Chest: No stridor. She has wheezes. She has no rales. She exhibits no tenderness.  Mild wheezing bilaterally.  Abdominal: She exhibits no distension. There is no tenderness. There is no rebound.  Musculoskeletal: Normal range of motion. She exhibits no edema.  Lymphadenopathy:    She has no cervical adenopathy.  Neurological: She is oriented  to person, place, and time. She exhibits normal muscle tone. Coordination normal.  Skin: No rash noted. No erythema.  Psychiatric: She has a normal mood and affect. Her behavior is normal.    ED Course  Procedures (including critical care time)  DIAGNOSTIC STUDIES: Oxygen Saturation is 90% on room air, adequate by my interpretation.    COORDINATION OF CARE: At 2137 Discussed treatment plan with patient which includes prednisone, Benadryl, and observation. Patient agrees.   Labs Review Labs Reviewed - No data to display  Imaging Review No results found.   EKG Interpretation None      MDM   Final diagnoses:  None   Allergic reaction  The chart was scribed for me under my direct supervision.  I personally performed the history, physical, and medical decision making and all procedures in the evaluation of this patient..'   Maudry Diego, MD 11/30/13 [phone]

## 2013-11-30 NOTE — Discharge Instructions (Signed)
Follow up with your md if not improving.   Stop the antibiotics you are taking.  Take benadryl 25mg  every 4 hours for rash or itching

## 2013-11-30 NOTE — ED Notes (Signed)
Pt. Reports eating a large amount of tomatoes over the past several days and ate some tonight. Pt. Reports that she thinks she might now be allergic. Pt. C/o generalized itching. Pt. Denies rash. Pt. Reports taking 1/2 a xanax and 1/2 hydrocodone prior to arrival.

## 2013-11-30 NOTE — ED Notes (Signed)
I am itching all over per pt. Having some trouble breathing. May be allergic to tomatoes. I have been taking keflex for about a week per pt. Just finished chemo and radiation per pt.

## 2013-12-07 ENCOUNTER — Telehealth: Payer: Self-pay | Admitting: Oncology

## 2013-12-07 NOTE — Telephone Encounter (Addendum)
Dawn Mercer called and said she had been short of breath.  She said her oxygen saturation has been running at 84% when she has doctor's appointments.  She said she has been waking up at night and thinks it is due to shortness of breath.  She has using an albuterol inhaler but does not have home oxygen.  She also reports a frequent cough with green sputum.  She is wondering if she needs home oxygen.  She is not running a temperature.    Called her back and advised her that Dr. Sondra Come would like to see her tomorrow.  Transferred her to the scheduler.

## 2013-12-08 ENCOUNTER — Encounter: Payer: Self-pay | Admitting: Radiation Oncology

## 2013-12-08 ENCOUNTER — Ambulatory Visit
Admission: RE | Admit: 2013-12-08 | Discharge: 2013-12-08 | Disposition: A | Discharge status: Home / Self Care | Payer: Medicare Other | Source: Ambulatory Visit | Attending: Radiation Oncology | Admitting: Radiation Oncology

## 2013-12-08 VITALS — Temp 98.2°F | Resp 20 | Ht 66.0 in | Wt 168.3 lb

## 2013-12-08 DIAGNOSIS — C539 Malignant neoplasm of cervix uteri, unspecified: Secondary | ICD-10-CM

## 2013-12-08 DIAGNOSIS — C3411 Malignant neoplasm of upper lobe, right bronchus or lung: Secondary | ICD-10-CM

## 2013-12-08 MED ORDER — METHYLPREDNISOLONE (PAK) 4 MG PO TABS: ORAL_TABLET | ORAL | Status: DC

## 2013-12-08 MED ORDER — ALPRAZOLAM 0.5 MG PO TABS: 0.2500 mg | ORAL_TABLET | Freq: Two times a day (BID) | ORAL | Status: DC | PRN

## 2013-12-08 MED ORDER — HYDROCODONE-ACETAMINOPHEN 5-325 MG PO TABS: 1.0000 | ORAL_TABLET | Freq: Four times a day (QID) | ORAL | Status: DC | PRN

## 2013-12-08 MED ORDER — LEVOFLOXACIN 500 MG PO TABS: 500.0000 mg | ORAL_TABLET | Freq: Every day | ORAL | Status: DC

## 2013-12-08 NOTE — Progress Notes (Signed)
Radiation Oncology         [phone] ________________________________  Name: Dawn Mercer MRN: [id number]  Date: 12/08/2013  DOB: 1945/05/02  Follow-Up Visit Note  CC: [name], MD  Aura Dials, MD    ICD-9-CM ICD-10-CM  1. Cervix cancer 180.9 C53.9  2. Malignant neoplasm of upper lobe of right lung 162.3 C34.11    Diagnosis: Stage IIIA (T1b., N2, M0) non-small cell lung cancer, squamous cell carcinoma presented with right upper lobe lung nodule    Interval Since Last Radiation:  9  months  Narrative:  The patient requested to be seen today. She's had worsening dyspnea on exertion. She denies any hemoptysis or pain within the chest area. She denies any dizziness or headaches. Patient does report grade sputum production with coughing                           ALLERGIES:  is allergic to latex and codeine.  Meds: Current Outpatient Prescriptions  Medication Sig Dispense Refill  . albuterol (PROVENTIL HFA;VENTOLIN HFA) 108 (90 BASE) MCG/ACT inhaler Inhale 1 puff into the lungs every 6 (six) hours as needed for wheezing.      Marland Kitchen ALPRAZolam (XANAX) 0.5 MG tablet Take 0.5 tablets (0.25 mg total) by mouth 2 (two) times daily as needed for anxiety.  30 tablet  0  . Cholecalciferol (VITAMIN D-3) 5000 UNITS TABS Take 5,000 Units by mouth 2 (two) times a week.      . clidinium-chlordiazePOXIDE (LIBRAX) 5-2.5 MG per capsule Take 1 capsule by mouth daily as needed (for upset stomach).      . esomeprazole (NEXIUM) 40 MG capsule Take 40 mg by mouth daily before breakfast.      . HYDROcodone-acetaminophen (NORCO) 5-325 MG per tablet Take 1-2 tablets by mouth every 6 (six) hours as needed.  40 tablet  0  . levothyroxine (SYNTHROID, LEVOTHROID) 112 MCG tablet Take 112 mcg by mouth daily before breakfast.      . Multiple Vitamins-Minerals (MULTIVITAMIN ADULTS 50+ PO) Take by mouth.      . sucralfate (CARAFATE) 1 GM/10ML suspension Take 1 g by mouth 4 (four) times daily -   with meals and at bedtime.      . gabapentin (NEURONTIN) 100 MG capsule Take 1 capsule (100 mg total) by mouth 3 (three) times daily.  90 capsule  1  . levofloxacin (LEVAQUIN) 500 MG tablet Take 1 tablet (500 mg total) by mouth daily.  7 tablet  0  . methylPREDNIsolone (MEDROL DOSPACK) 4 MG tablet follow package directions  21 tablet  0  . ondansetron (ZOFRAN) 4 MG tablet Take 4 mg by mouth every 8 (eight) hours as needed for nausea or vomiting.        No current facility-administered medications for this encounter.    Physical Findings: The patient is in no acute distress. Patient is alert and oriented.  height is 5\' 6"  (1.676 m) and weight is 168 lb 4.8 oz (76.34 kg). Her oral temperature is 98.2 F (36.8 C). Her respiration is 20 and oxygen saturation is 84%. .  Oxygen saturation after sitting improved to 91%. The lungs are clear with breath sounds in general distant. The heart has a regular rhythm and rate. Peripheral pulses are strong and steady. No palpable supraclavicular or axillary adenopathy  Lab Findings: Lab Results  Component Value Date   WBC 3.5* 10/04/2013   HGB 11.2* 10/04/2013   HCT 35.4 10/04/2013  MCV 95.4 10/04/2013   PLT 151 10/04/2013    Radiographic Findings: No results found.  Impression:  Stage IIIA (T1b., N2, M0) non-small cell lung cancer, squamous cell carcinoma presented with right upper lobe lung nodule. The patient has likely developed acute exacerbation of her COPD. She may have bronchitis with her Green sputum production.   Plan:  Medrol Dosepak, Levaquin. Patient also had refill on her Xanax and hydrocodone. Patient will call if she is not better by Monday in terms of her breathing in which case I would order a chest x-ray or chest CT scan. The patient is scheduled for a chest CT scan next month as part of her lung cancer followup.  ____________________________________ Blair Promise, MD

## 2013-12-08 NOTE — Progress Notes (Addendum)
Dawn Mercer denies pain.  She is taking half a norco tablet in the morning at night for her upper back pain.  She is requesting a refill.  She also needs a refill of xanax.  She reports worsening of her shortness of breath especially with activity.  She reports that she wakes up a night and is not able to breath.  She notices that she can't breath laying flat.  Her oxygen saturation on arrival was 84% and went up to 93% with rest.  After ambulating 250 feet, her oxygen saturation went down to 75%.  She was placed on 2 l of oxygen via nasal canula.  She reports a cough with green sputum that she says has gotten better.   She is using her albuterol inhaler.

## 2013-12-14 ENCOUNTER — Telehealth: Payer: Self-pay | Admitting: Oncology

## 2013-12-14 NOTE — Telephone Encounter (Signed)
Rosi left a message and said her cough is better.  She said to tell Dr. Sondra Come that she is feeling a lot better.

## 2014-01-10 ENCOUNTER — Ambulatory Visit (HOSPITAL_COMMUNITY)
Admission: RE | Admit: 2014-01-10 | Discharge: 2014-01-10 | Disposition: A | Discharge status: Home / Self Care | Payer: Medicare Other | Source: Ambulatory Visit | Attending: Internal Medicine | Admitting: Internal Medicine

## 2014-01-10 ENCOUNTER — Other Ambulatory Visit (HOSPITAL_BASED_OUTPATIENT_CLINIC_OR_DEPARTMENT_OTHER): Payer: Medicare Other

## 2014-01-10 ENCOUNTER — Encounter (HOSPITAL_COMMUNITY): Payer: Self-pay

## 2014-01-10 DIAGNOSIS — Z9049 Acquired absence of other specified parts of digestive tract: Secondary | ICD-10-CM | POA: Diagnosis not present

## 2014-01-10 DIAGNOSIS — C3410 Malignant neoplasm of upper lobe, unspecified bronchus or lung: Secondary | ICD-10-CM

## 2014-01-10 DIAGNOSIS — R911 Solitary pulmonary nodule: Secondary | ICD-10-CM | POA: Diagnosis not present

## 2014-01-10 DIAGNOSIS — C3411 Malignant neoplasm of upper lobe, right bronchus or lung: Secondary | ICD-10-CM | POA: Insufficient documentation

## 2014-01-10 DIAGNOSIS — I2510 Atherosclerotic heart disease of native coronary artery without angina pectoris: Secondary | ICD-10-CM | POA: Diagnosis not present

## 2014-01-10 DIAGNOSIS — J432 Centrilobular emphysema: Secondary | ICD-10-CM | POA: Diagnosis not present

## 2014-01-10 DIAGNOSIS — Z9221 Personal history of antineoplastic chemotherapy: Secondary | ICD-10-CM | POA: Insufficient documentation

## 2014-01-10 DIAGNOSIS — I700 Atherosclerosis of aorta: Secondary | ICD-10-CM | POA: Diagnosis not present

## 2014-01-10 DIAGNOSIS — J438 Other emphysema: Secondary | ICD-10-CM | POA: Insufficient documentation

## 2014-01-10 LAB — COMPREHENSIVE METABOLIC PANEL (CC13)
ALK PHOS: 98 U/L (ref 40–150)
ALT: 10 U/L (ref 0–55)
AST: 14 U/L (ref 5–34)
Albumin: 3.5 g/dL (ref 3.5–5.0)
Anion Gap: 7 mEq/L (ref 3–11)
BILIRUBIN TOTAL: 0.6 mg/dL (ref 0.20–1.20)
BUN: 13.9 mg/dL (ref 7.0–26.0)
CO2: 29 mEq/L (ref 22–29)
Calcium: 9.7 mg/dL (ref 8.4–10.4)
Chloride: 102 mEq/L (ref 98–109)
Creatinine: 1 mg/dL (ref 0.6–1.1)
Glucose: 87 mg/dl (ref 70–140)
Potassium: 4.3 mEq/L (ref 3.5–5.1)
SODIUM: 138 meq/L (ref 136–145)
TOTAL PROTEIN: 7.6 g/dL (ref 6.4–8.3)

## 2014-01-10 LAB — CBC WITH DIFFERENTIAL/PLATELET
BASO%: 0.2 % (ref 0.0–2.0)
Basophils Absolute: 0 10*3/uL (ref 0.0–0.1)
EOS%: 2.2 % (ref 0.0–7.0)
Eosinophils Absolute: 0.1 10*3/uL (ref 0.0–0.5)
HCT: 37.6 % (ref 34.8–46.6)
HGB: 12 g/dL (ref 11.6–15.9)
LYMPH%: 21.3 % (ref 14.0–49.7)
MCH: 29.3 pg (ref 25.1–34.0)
MCHC: 31.9 g/dL (ref 31.5–36.0)
MCV: 91.7 fL (ref 79.5–101.0)
MONO#: 0.2 10*3/uL (ref 0.1–0.9)
MONO%: 5.3 % (ref 0.0–14.0)
NEUT#: 3.2 10*3/uL (ref 1.5–6.5)
NEUT%: 71 % (ref 38.4–76.8)
Platelets: 181 10*3/uL (ref 145–400)
RBC: 4.1 10*6/uL (ref 3.70–5.45)
RDW: 16.6 % — ABNORMAL HIGH (ref 11.2–14.5)
WBC: 4.5 10*3/uL (ref 3.9–10.3)
lymph#: 1 10*3/uL (ref 0.9–3.3)

## 2014-01-10 MED ORDER — IOHEXOL 300 MG/ML  SOLN
80.0000 mL | Freq: Once | INTRAMUSCULAR | Status: AC | PRN
  Administered 2014-01-10: 80 mL via INTRAVENOUS

## 2014-01-17 ENCOUNTER — Telehealth: Payer: Self-pay | Admitting: Internal Medicine

## 2014-01-17 ENCOUNTER — Encounter: Payer: Self-pay | Admitting: Internal Medicine

## 2014-01-17 ENCOUNTER — Ambulatory Visit (HOSPITAL_BASED_OUTPATIENT_CLINIC_OR_DEPARTMENT_OTHER): Payer: Medicare Other | Admitting: Internal Medicine

## 2014-01-17 VITALS — BP 97/74 | HR 74 | Temp 97.6°F | Resp 18 | Ht 66.0 in | Wt 166.4 lb

## 2014-01-17 DIAGNOSIS — C3411 Malignant neoplasm of upper lobe, right bronchus or lung: Secondary | ICD-10-CM

## 2014-01-17 NOTE — Telephone Encounter (Signed)
gv adn printeda ppt sched and avs for pt for Feb 2016

## 2014-01-17 NOTE — Progress Notes (Signed)
[facility] [phone]   [phone]  OFFICE PROGRESS NOTE  [name], MD [phone] [address], [address] [place] [address]  DIAGNOSIS: Stage IIIA (T1b., N2, M0) non-small cell lung cancer, squamous cell carcinoma presented with right upper lobe lung nodule in addition to mediastinal lymphadenopathy diagnosed in November of 2014.  PRIOR THERAPY:  1) Concurrent chemoradiation with weekly carboplatin for AUC of 2 and paclitaxel 45 mg/M2, status post 8 cycles, last dose was given 03/15/2013 with partial response. 2) Consolidation chemotherapy with carboplatin for AUC of 5 and paclitaxel 175 mg/M2 every 3 weeks with Neulasta support, status post 3 cycles. First dose 05/03/2013. Last dose was given 06/14/2013.  CURRENT THERAPY: Observation.  CHEMOTHERAPY INTENT: Control/curative  CURRENT # OF CHEMOTHERAPY CYCLES: 0  CURRENT ANTIEMETICS: Zofran, dexamethasone and Compazine  CURRENT SMOKING STATUS: Former smoker.  ORAL CHEMOTHERAPY AND CONSENT: None  CURRENT BISPHOSPHONATES USE: None  PAIN MANAGEMENT: 0/10  NARCOTICS INDUCED CONSTIPATION: None  LIVING WILL AND CODE STATUS: Full code   INTERVAL HISTORY: Dawn Mercer 67 y.o. female returns to the clinic today for followup visit accompanied by her husband. She has been on observation for the last 6 months with no significant complaints. She denied having any significant chest pain, shortness of breath, cough or hemoptysis. She denied having any nausea or vomiting. She has no significant weight loss or night sweats. She had repeat CT scan of the chest performed recently and she is here for evaluation and discussion of her scan results.  MEDICAL HISTORY: Past Medical History  Diagnosis Date  . 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 Status post radiation therapy 10/1999  . GERD (gastroesophageal reflux disease)   . Hypothyroidism   . IBS (irritable bowel syndrome)      Hx: of  . COPD (chronic obstructive pulmonary disease)   . Shortness of breath     Hx: of with exertion  . Bronchitis     Hx: of  . Hay fever     Hx: of  . Seizures     Hx: of over 40 years ago  . H/O hiatal hernia   . History of radiation therapy 01/28/13-03/23/13    63 gray to right upper lobe  . 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 Lung cancer, upper lobe dx'd 12/2012    chemo and XRt complete  . Anxiety     ALLERGIES:  is allergic to latex and codeine.  MEDICATIONS:  Current Outpatient Prescriptions  Medication Sig Dispense Refill  . albuterol (PROVENTIL HFA;VENTOLIN HFA) 108 (90 BASE) MCG/ACT inhaler Inhale 1 puff into the lungs every 6 (six) hours as needed for wheezing.    Marland Kitchen ALPRAZolam (XANAX) 0.5 MG tablet Take 0.5 tablets (0.25 mg total) by mouth 2 (two) times daily as needed for anxiety. 30 tablet 0  . Cholecalciferol (VITAMIN D-3) 5000 UNITS TABS Take 5,000 Units by mouth 2 (two) times a week.    . clidinium-chlordiazePOXIDE (LIBRAX) 5-2.5 MG per capsule Take 1 capsule by mouth daily as needed (for upset stomach).    . esomeprazole (NEXIUM) 40 MG capsule Take 40 mg by mouth daily before breakfast.    . gabapentin (NEURONTIN) 100 MG capsule Take 1 capsule (100 mg total) by mouth 3 (three) times daily. 90 capsule 1  . HYDROcodone-acetaminophen (NORCO) 5-325 MG per tablet Take 1-2 tablets by mouth every 6 (six) hours as needed. 40 tablet 0  .  levofloxacin (LEVAQUIN) 500 MG tablet Take 1 tablet (500 mg total) by mouth daily. 7 tablet 0  . levothyroxine (SYNTHROID, LEVOTHROID) 112 MCG tablet Take 112 mcg by mouth daily before breakfast.    . methylPREDNIsolone (MEDROL DOSPACK) 4 MG tablet follow package directions 21 tablet 0  . Multiple Vitamins-Minerals (MULTIVITAMIN ADULTS 50+ PO) Take by mouth.    . ondansetron (ZOFRAN) 4 MG tablet Take 4 mg by mouth every 8 (eight) hours as needed for nausea or vomiting.     . sucralfate (CARAFATE) 1 GM/10ML  suspension Take 1 g by mouth 4 (four) times daily -  with meals and at bedtime.     No current facility-administered medications for this visit.    SURGICAL HISTORY:  Past Surgical History  Procedure Laterality Date  . Laser ablation  04/2004    for VIN III  . Colonoscopy w/ biopsies and polypectomy      Hx: of  . Tubal ligation    . Video bronchoscopy with endobronchial navigation N/A 12/31/2012    Procedure: VIDEO BRONCHOSCOPY WITH ENDOBRONCHIAL NAVIGATION;  Surgeon: Melrose Nakayama, MD;  Location: [facility];  Service: Thoracic;  Laterality: N/A;  . Video bronchoscopy with endobronchial ultrasound N/A 12/31/2012    Procedure: VIDEO BRONCHOSCOPY WITH ENDOBRONCHIAL ULTRASOUND;  Surgeon: Melrose Nakayama, MD;  Location: [facility];  Service: Thoracic;  Laterality: N/A;  . Cholecystectomy N/A 08/03/2013    Procedure: LAPAROSCOPIC CHOLECYSTECTOMY;  Surgeon: Harl Bowie, MD;  Location: [facility];  Service: General;  Laterality: N/A;    REVIEW OF SYSTEMS:  Constitutional: negative Eyes: negative Ears, nose, mouth, throat, and face: negative Respiratory: negative Cardiovascular: negative Gastrointestinal: negative Genitourinary:negative Integument/breast: negative Hematologic/lymphatic: negative Musculoskeletal:negative Neurological: negative Behavioral/Psych: negative Endocrine: negative Allergic/Immunologic: negative   PHYSICAL EXAMINATION: General appearance: alert, cooperative and no distress Head: Normocephalic, without obvious abnormality, atraumatic Neck: no adenopathy, no JVD, supple, symmetrical, trachea midline and thyroid not enlarged, symmetric, no tenderness/mass/nodules Lymph nodes: Cervical, supraclavicular, and axillary nodes normal. Resp: clear to auscultation bilaterally Back: symmetric, no curvature. ROM normal. No CVA tenderness. Cardio: regular rate and rhythm, S1, S2 normal, no murmur, click, rub or gallop GI: soft, non-tender; bowel sounds normal; no masses,   no organomegaly Extremities: extremities normal, atraumatic, no cyanosis or edema Neurologic: Alert and oriented X 3, normal strength and tone. Normal symmetric reflexes. Normal coordination and gait  ECOG PERFORMANCE STATUS: 1 - Symptomatic but completely ambulatory  Blood pressure 97/74, pulse 74, temperature 97.6 F (36.4 C), temperature source Oral, resp. rate 18, height 5\' 6"  (1.676 m), weight 166 lb 6.4 oz (75.479 kg), SpO2 90 %.  LABORATORY DATA: Lab Results  Component Value Date   WBC 4.5 01/10/2014   HGB 12.0 01/10/2014   HCT 37.6 01/10/2014   MCV 91.7 01/10/2014   PLT 181 01/10/2014      Chemistry      Component Value Date/Time   NA 138 01/10/2014 1252   NA 139 10/04/2013 1051   K 4.3 01/10/2014 1252   K 4.5 10/04/2013 1051   CL 101 10/04/2013 1051   CO2 29 01/10/2014 1252   CO2 26 10/04/2013 1051   BUN 13.9 01/10/2014 1252   BUN 13 10/04/2013 1051   CREATININE 1.0 01/10/2014 1252   CREATININE 0.81 10/04/2013 1051      Component Value Date/Time   CALCIUM 9.7 01/10/2014 1252   CALCIUM 9.4 10/04/2013 1051   ALKPHOS 98 01/10/2014 1252   ALKPHOS 107 10/04/2013 1051   AST 14 01/10/2014 1252  AST 16 10/04/2013 1051   ALT 10 01/10/2014 1252   ALT 9 10/04/2013 1051   BILITOT 0.60 01/10/2014 1252   BILITOT 0.6 10/04/2013 1051       RADIOGRAPHIC STUDIES: Ct Chest W Contrast  01/10/2014   CLINICAL DATA:  68 year old female with history of right upper lobe lung cancer diagnosed in November 2014 status post chemotherapy which is now complete.  EXAM: CT CHEST WITH CONTRAST  TECHNIQUE: Multidetector CT imaging of the chest was performed during intravenous contrast administration.  CONTRAST:  73mL OMNIPAQUE IOHEXOL 300 MG/ML  SOLN  COMPARISON:  Chest CT 10/04/2013.  FINDINGS: Mediastinum: Heart size is normal. There is a small amount of pericardial fluid and/or thickening anteriorly, unlikely to be of any hemodynamic significance at this time. No associated pericardial  calcification. There is atherosclerosis of the thoracic aorta, the great vessels of the mediastinum and the coronary arteries, including calcified atherosclerotic plaque in the left main, left anterior descending and right coronary arteries. 7 mm short axis subcarinal lymph node again noted (nonspecific). No pathologically enlarged mediastinal or hilar lymph nodes. Esophagus is unremarkable in appearance. Right internal jugular single-lumen porta cath with tip terminating in the right atrium.  Lungs/Pleura: Previously noted right upper lobe pulmonary nodule is unchanged in size, currently measuring 9 x 8 mm (image 22 of series 5). This is associated with overlying pleural retraction (unchanged). Innumerable tiny 1-3 mm pulmonary nodules are again scattered throughout the lungs bilaterally, presumably areas of mucoid impaction within terminal bronchioles. No larger more suspicious appearing pulmonary nodules or masses are otherwise noted. No acute consolidative airspace disease. No pleural effusions. Mild diffuse bronchial wall thickening with moderate centrilobular and mild paraseptal emphysema.  Upper Abdomen: Extensive atherosclerosis. Status post cholecystectomy.  Musculoskeletal: Images 24 and 25 of series 5 demonstrate areas of sclerosis in the anterolateral aspect of the right fourth rib immediately adjacent to the treated right upper lobe lesion. There is a subtle offset in this region, suggestive of a nondisplaced fracture. There are no other aggressive appearing lytic or blastic lesions noted in the visualized portions of the skeleton.  IMPRESSION: 1. Stable appearance of the right upper lobe pulmonary nodule, which currently measures 9 x 8 mm. 2. Osseous changes in the adjacent antrerolateral right fourth rib suspicious for post radiation osteitis/osteonecrosis, potentially with a subtle nondisplaced pathologic fracture. Attention on followup studies is recommended. 3. Innumerable tiny 1-3 mm pulmonary  nodules scattered throughout the lungs bilaterally appear similar to prior examinations, presumably benign areas of mucoid impaction within terminal bronchioles. 4. Mild diffuse bronchial wall thickening with moderate centrilobular and mild paraseptal emphysema. 5. Atherosclerosis, including left main and 2 vessel coronary artery disease. Please note that although the presence of coronary artery calcium documents the presence of coronary artery disease, the severity of this disease and any potential stenosis cannot be assessed on this non-gated CT examination. Assessment for potential risk factor modification, dietary therapy or pharmacologic therapy may be warranted, if clinically indicated. 6. Additional incidental findings, as above.   Electronically Signed   By: Vinnie Langton M.D.   On: 01/10/2014 16:53   ASSESSMENT AND PLAN: This is a very pleasant 68 years old white female recently diagnosed with a stage IIIA non-small cell lung cancer currently undergoing concurrent chemoradiation with weekly carboplatin and paclitaxel status post 8 weekly treatment followed by consolidation chemotherapy with carboplatin and paclitaxel status post 3 cycles.  She has been on observation for the last 6 months and feeling very well. Her recent CT  scan of the chest showed no evidence for disease progression. I discussed the scan results with the patient and her husband. I recommended for her to continue on observation. I would see the patient back for followup visit in 3 months after repeating CT scan of the chest. She was advised to call immediately if she has any concerning symptoms in the interval.  The patient voices understanding of current disease status and treatment options and is in agreement with the current care plan.  All questions were answered. The patient knows to call the clinic with any problems, questions or concerns. We can certainly see the patient much sooner if necessary.  Disclaimer: This  note was dictated with voice recognition software. Similar sounding words can inadvertently be transcribed and may not be corrected upon review.

## 2014-01-27 ENCOUNTER — Encounter: Payer: Self-pay | Admitting: Radiation Oncology

## 2014-01-27 ENCOUNTER — Encounter: Payer: Self-pay | Admitting: *Deleted

## 2014-01-27 ENCOUNTER — Telehealth: Payer: Self-pay | Admitting: Oncology

## 2014-01-27 ENCOUNTER — Ambulatory Visit
Admission: RE | Admit: 2014-01-27 | Discharge: 2014-01-27 | Disposition: A | Discharge status: Home / Self Care | Payer: Medicare Other | Source: Ambulatory Visit | Attending: Radiation Oncology | Admitting: Radiation Oncology

## 2014-01-27 ENCOUNTER — Other Ambulatory Visit: Payer: Self-pay | Admitting: *Deleted

## 2014-01-27 ENCOUNTER — Telehealth: Payer: Self-pay | Admitting: Internal Medicine

## 2014-01-27 VITALS — BP 105/71 | HR 75 | Temp 97.8°F | Resp 20 | Ht 66.0 in | Wt 169.3 lb

## 2014-01-27 DIAGNOSIS — C3411 Malignant neoplasm of upper lobe, right bronchus or lung: Secondary | ICD-10-CM

## 2014-01-27 MED ORDER — HYDROCODONE-ACETAMINOPHEN 5-325 MG PO TABS: 1.0000 | ORAL_TABLET | Freq: Four times a day (QID) | ORAL | Status: DC | PRN

## 2014-01-27 MED ORDER — ALPRAZOLAM 0.5 MG PO TABS: 0.2500 mg | ORAL_TABLET | Freq: Two times a day (BID) | ORAL | Status: DC | PRN

## 2014-01-27 NOTE — Progress Notes (Signed)
Radiation Oncology         [phone] ________________________________  Name: Dawn Mercer MRN: [id number]  Date: 01/27/2014  DOB: 05/23/1945  Follow-Up Visit Note  CC: [name], MD  Aura Dials, MD    ICD-9-CM ICD-10-CM   1. Malignant neoplasm of upper lobe of right lung 162.3 C34.11    Diagnosis: Stage IIIA (T1b., N2, M0) non-small cell lung cancer, squamous cell carcinoma presenting with right upper lobe lung nodule   Interval Since Last Radiation:  10  months  Narrative:  The patient returns today for routine follow-up.  She is doing much better after being placed on Levaquin for presumed bronchitis. Patient denies any pain in the chest area or hemoptysis. She did undergo recent chest CT scan which showed no tumor progression.  She continues to have chronic pain related to her arthritis and I've refilled her hydrocodone for this issue.                               ALLERGIES:  is allergic to latex and codeine.  Meds: Current Outpatient Prescriptions  Medication Sig Dispense Refill  . albuterol (PROVENTIL HFA;VENTOLIN HFA) 108 (90 BASE) MCG/ACT inhaler Inhale 1 puff into the lungs every 6 (six) hours as needed for wheezing.    Marland Kitchen ALPRAZolam (XANAX) 0.5 MG tablet Take 0.5 tablets (0.25 mg total) by mouth 2 (two) times daily as needed for anxiety. 150 tablet 0  . Cholecalciferol (VITAMIN D-3) 5000 UNITS TABS Take 5,000 Units by mouth 2 (two) times a week.    . esomeprazole (NEXIUM) 40 MG capsule Take 40 mg by mouth daily before breakfast.    . HYDROcodone-acetaminophen (NORCO) 5-325 MG per tablet Take 1 tablet by mouth every 6 (six) hours as needed. 150 tablet 0  . levothyroxine (SYNTHROID, LEVOTHROID) 112 MCG tablet Take 112 mcg by mouth daily before breakfast.    . Multiple Vitamins-Minerals (MULTIVITAMIN ADULTS 50+ PO) Take by mouth.    . sucralfate (CARAFATE) 1 GM/10ML suspension Take 1 g by mouth 4 (four) times daily -  with meals and at bedtime.    .  gabapentin (NEURONTIN) 100 MG capsule Take 1 capsule (100 mg total) by mouth 3 (three) times daily. (Patient not taking: Reported on 01/27/2014) 90 capsule 1  . levofloxacin (LEVAQUIN) 500 MG tablet Take 1 tablet (500 mg total) by mouth daily. (Patient not taking: Reported on 01/27/2014) 7 tablet 0  . methylPREDNIsolone (MEDROL DOSPACK) 4 MG tablet follow package directions (Patient not taking: Reported on 01/27/2014) 21 tablet 0   No current facility-administered medications for this encounter.    Physical Findings: The patient is in no acute distress. Patient is alert and oriented.  height is 5\' 6"  (1.676 m) and weight is 169 lb 4.8 oz (76.794 kg). Her oral temperature is 97.8 F (36.6 C). Her blood pressure is 105/71 and her pulse is 75. Her respiration is 20 and oxygen saturation is 94%. .  No palpable subclavicular or axillary adenopathy. The lungs are clear to auscultation. The heart has regular rhythm and rate.  Lab Findings: Lab Results  Component Value Date   WBC 4.5 01/10/2014   HGB 12.0 01/10/2014   HCT 37.6 01/10/2014   MCV 91.7 01/10/2014   PLT 181 01/10/2014    Radiographic Findings: Ct Chest W Contrast  01/10/2014   CLINICAL DATA:  68 year old female with history of right upper lobe lung cancer diagnosed in November 2014 status  post chemotherapy which is now complete.  EXAM: CT CHEST WITH CONTRAST  TECHNIQUE: Multidetector CT imaging of the chest was performed during intravenous contrast administration.  CONTRAST:  62mL OMNIPAQUE IOHEXOL 300 MG/ML  SOLN  COMPARISON:  Chest CT 10/04/2013.  FINDINGS: Mediastinum: Heart size is normal. There is a small amount of pericardial fluid and/or thickening anteriorly, unlikely to be of any hemodynamic significance at this time. No associated pericardial calcification. There is atherosclerosis of the thoracic aorta, the great vessels of the mediastinum and the coronary arteries, including calcified atherosclerotic plaque in the left main,  left anterior descending and right coronary arteries. 7 mm short axis subcarinal lymph node again noted (nonspecific). No pathologically enlarged mediastinal or hilar lymph nodes. Esophagus is unremarkable in appearance. Right internal jugular single-lumen porta cath with tip terminating in the right atrium.  Lungs/Pleura: Previously noted right upper lobe pulmonary nodule is unchanged in size, currently measuring 9 x 8 mm (image 22 of series 5). This is associated with overlying pleural retraction (unchanged). Innumerable tiny 1-3 mm pulmonary nodules are again scattered throughout the lungs bilaterally, presumably areas of mucoid impaction within terminal bronchioles. No larger more suspicious appearing pulmonary nodules or masses are otherwise noted. No acute consolidative airspace disease. No pleural effusions. Mild diffuse bronchial wall thickening with moderate centrilobular and mild paraseptal emphysema.  Upper Abdomen: Extensive atherosclerosis. Status post cholecystectomy.  Musculoskeletal: Images 24 and 25 of series 5 demonstrate areas of sclerosis in the anterolateral aspect of the right fourth rib immediately adjacent to the treated right upper lobe lesion. There is a subtle offset in this region, suggestive of a nondisplaced fracture. There are no other aggressive appearing lytic or blastic lesions noted in the visualized portions of the skeleton.  IMPRESSION: 1. Stable appearance of the right upper lobe pulmonary nodule, which currently measures 9 x 8 mm. 2. Osseous changes in the adjacent antrerolateral right fourth rib suspicious for post radiation osteitis/osteonecrosis, potentially with a subtle nondisplaced pathologic fracture. Attention on followup studies is recommended. 3. Innumerable tiny 1-3 mm pulmonary nodules scattered throughout the lungs bilaterally appear similar to prior examinations, presumably benign areas of mucoid impaction within terminal bronchioles. 4. Mild diffuse bronchial  wall thickening with moderate centrilobular and mild paraseptal emphysema. 5. Atherosclerosis, including left main and 2 vessel coronary artery disease. Please note that although the presence of coronary artery calcium documents the presence of coronary artery disease, the severity of this disease and any potential stenosis cannot be assessed on this non-gated CT examination. Assessment for potential risk factor modification, dietary therapy or pharmacologic therapy may be warranted, if clinically indicated. 6. Additional incidental findings, as above.   Electronically Signed   By: Vinnie Langton M.D.   On: 01/10/2014 16:53    Impression:  No evidence recurrence on recent CT scan and clinical exam today  Plan:  Routine followup in between medical oncology followups.   ____________________________________ Blair Promise, MD

## 2014-01-27 NOTE — Telephone Encounter (Signed)
[place] called to make sure they quantity (150 tablets) was correct on the script for Xanax.  Per Dr. Sondra Come it is correct.  [place] back and advised them the quantity is correct.  They verbalized agreement.

## 2014-01-27 NOTE — Telephone Encounter (Signed)
per pof ...mailed pt avs and letter

## 2014-01-27 NOTE — [facility] Oncology Navigator Note (Unsigned)
Saw patient at [facility] today.  She had a question about her port being flushed.  I discussed with Dr. Julien Nordmann and Simon Rhein RN.  Colletta [place] with put in a treatment onc schedule for her to get flush and to get her appt mailed to her per patients request.

## 2014-01-27 NOTE — Progress Notes (Signed)
Dawn Mercer here for follow up after treatment for lung cancer.  She denies pain.  She reports her cough went away after taking Levaquin and prednisone but returned a couple weeks after stopping them.  She reports the cough is nonproductive.  She reports an occasional sore throat and takes Carafate as needed.  She reports her shortness of breath with activity is better.  She is wondering anxiety is causing it.  She is requesting a refill on xanax and hydrocodone/acetaminophen.  She reports taking the Norco 1/2 tablet in the am and pm.  She reports her appetite is good.  She reports her energy level is OK but she does get tired easily.

## 2014-03-07 ENCOUNTER — Ambulatory Visit (HOSPITAL_BASED_OUTPATIENT_CLINIC_OR_DEPARTMENT_OTHER): Payer: Medicare Other

## 2014-03-07 VITALS — BP 120/53 | HR 90 | Temp 97.8°F

## 2014-03-07 DIAGNOSIS — C3411 Malignant neoplasm of upper lobe, right bronchus or lung: Secondary | ICD-10-CM

## 2014-03-07 DIAGNOSIS — Z452 Encounter for adjustment and management of vascular access device: Secondary | ICD-10-CM

## 2014-03-07 DIAGNOSIS — Z95828 Presence of other vascular implants and grafts: Secondary | ICD-10-CM

## 2014-03-07 MED ORDER — HEPARIN SOD (PORK) LOCK FLUSH 100 UNIT/ML IV SOLN
500.0000 [IU] | Freq: Once | INTRAVENOUS | Status: AC
  Administered 2014-03-07: 500 [IU] via INTRAVENOUS
  Filled 2014-03-07: qty 5

## 2014-03-07 MED ORDER — SODIUM CHLORIDE 0.9 % IJ SOLN
10.0000 mL | INTRAMUSCULAR | Status: DC | PRN
  Administered 2014-03-07: 10 mL via INTRAVENOUS
  Filled 2014-03-07: qty 10

## 2014-03-07 NOTE — Patient Instructions (Signed)

## 2014-04-18 ENCOUNTER — Other Ambulatory Visit: Payer: Medicare Other

## 2014-04-18 ENCOUNTER — Other Ambulatory Visit (HOSPITAL_BASED_OUTPATIENT_CLINIC_OR_DEPARTMENT_OTHER): Payer: Medicare Other

## 2014-04-18 ENCOUNTER — Encounter (HOSPITAL_COMMUNITY): Payer: Self-pay

## 2014-04-18 ENCOUNTER — Ambulatory Visit (HOSPITAL_COMMUNITY)
Admission: RE | Admit: 2014-04-18 | Discharge: 2014-04-18 | Disposition: A | Discharge status: Home / Self Care | Payer: Medicare Other | Source: Ambulatory Visit | Attending: Internal Medicine | Admitting: Internal Medicine

## 2014-04-18 DIAGNOSIS — D3502 Benign neoplasm of left adrenal gland: Secondary | ICD-10-CM | POA: Diagnosis not present

## 2014-04-18 DIAGNOSIS — J449 Chronic obstructive pulmonary disease, unspecified: Secondary | ICD-10-CM | POA: Insufficient documentation

## 2014-04-18 DIAGNOSIS — R0602 Shortness of breath: Secondary | ICD-10-CM | POA: Diagnosis not present

## 2014-04-18 DIAGNOSIS — Z08 Encounter for follow-up examination after completed treatment for malignant neoplasm: Secondary | ICD-10-CM | POA: Diagnosis present

## 2014-04-18 DIAGNOSIS — Z923 Personal history of irradiation: Secondary | ICD-10-CM | POA: Diagnosis not present

## 2014-04-18 DIAGNOSIS — C3411 Malignant neoplasm of upper lobe, right bronchus or lung: Secondary | ICD-10-CM

## 2014-04-18 DIAGNOSIS — J432 Centrilobular emphysema: Secondary | ICD-10-CM | POA: Insufficient documentation

## 2014-04-18 DIAGNOSIS — Z9221 Personal history of antineoplastic chemotherapy: Secondary | ICD-10-CM | POA: Insufficient documentation

## 2014-04-18 DIAGNOSIS — C3490 Malignant neoplasm of unspecified part of unspecified bronchus or lung: Secondary | ICD-10-CM | POA: Insufficient documentation

## 2014-04-18 LAB — COMPREHENSIVE METABOLIC PANEL (CC13)
ALK PHOS: 108 U/L (ref 40–150)
ALT: 7 U/L (ref 0–55)
ANION GAP: 9 meq/L (ref 3–11)
AST: 16 U/L (ref 5–34)
Albumin: 3.4 g/dL — ABNORMAL LOW (ref 3.5–5.0)
BUN: 12.7 mg/dL (ref 7.0–26.0)
CHLORIDE: 104 meq/L (ref 98–109)
CO2: 28 meq/L (ref 22–29)
Calcium: 9.5 mg/dL (ref 8.4–10.4)
Creatinine: 0.9 mg/dL (ref 0.6–1.1)
EGFR: 64 mL/min/{1.73_m2} — ABNORMAL LOW (ref >90)
GLUCOSE: 94 mg/dL (ref 70–140)
POTASSIUM: 4.3 meq/L (ref 3.5–5.1)
Sodium: 141 mEq/L (ref 136–145)
TOTAL PROTEIN: 7.4 g/dL (ref 6.4–8.3)
Total Bilirubin: 0.73 mg/dL (ref 0.20–1.20)

## 2014-04-18 LAB — CBC WITH DIFFERENTIAL/PLATELET
BASO%: 0.6 % (ref 0.0–2.0)
Basophils Absolute: 0 10*3/uL (ref 0.0–0.1)
EOS%: 2.6 % (ref 0.0–7.0)
Eosinophils Absolute: 0.1 10*3/uL (ref 0.0–0.5)
HCT: 40.9 % (ref 34.8–46.6)
HGB: 12.9 g/dL (ref 11.6–15.9)
LYMPH#: 0.8 10*3/uL — AB (ref 0.9–3.3)
LYMPH%: 15.8 % (ref 14.0–49.7)
MCH: 28.5 pg (ref 25.1–34.0)
MCHC: 31.6 g/dL (ref 31.5–36.0)
MCV: 90.1 fL (ref 79.5–101.0)
MONO#: 0.4 10*3/uL (ref 0.1–0.9)
MONO%: 7.7 % (ref 0.0–14.0)
NEUT#: 3.7 10*3/uL (ref 1.5–6.5)
NEUT%: 73.3 % (ref 38.4–76.8)
Platelets: 193 10*3/uL (ref 145–400)
RBC: 4.54 10*6/uL (ref 3.70–5.45)
RDW: 16.9 % — AB (ref 11.2–14.5)
WBC: 5 10*3/uL (ref 3.9–10.3)

## 2014-04-18 MED ORDER — IOHEXOL 300 MG/ML  SOLN
80.0000 mL | Freq: Once | INTRAMUSCULAR | Status: AC | PRN
  Administered 2014-04-18: 80 mL via INTRAVENOUS

## 2014-04-25 ENCOUNTER — Other Ambulatory Visit: Payer: Self-pay | Admitting: Medical Oncology

## 2014-04-25 ENCOUNTER — Ambulatory Visit (HOSPITAL_BASED_OUTPATIENT_CLINIC_OR_DEPARTMENT_OTHER): Payer: Medicare Other | Admitting: Internal Medicine

## 2014-04-25 ENCOUNTER — Encounter: Payer: Self-pay | Admitting: Internal Medicine

## 2014-04-25 ENCOUNTER — Telehealth: Payer: Self-pay | Admitting: Internal Medicine

## 2014-04-25 VITALS — BP 114/63 | HR 73 | Temp 97.9°F | Resp 18 | Ht 66.0 in | Wt 167.6 lb

## 2014-04-25 DIAGNOSIS — E039 Hypothyroidism, unspecified: Secondary | ICD-10-CM

## 2014-04-25 DIAGNOSIS — J449 Chronic obstructive pulmonary disease, unspecified: Secondary | ICD-10-CM

## 2014-04-25 DIAGNOSIS — Z23 Encounter for immunization: Secondary | ICD-10-CM

## 2014-04-25 DIAGNOSIS — C3411 Malignant neoplasm of upper lobe, right bronchus or lung: Secondary | ICD-10-CM

## 2014-04-25 MED ORDER — INFLUENZA VAC SPLIT QUAD 0.5 ML IM SUSY
0.5000 mL | PREFILLED_SYRINGE | Freq: Once | INTRAMUSCULAR | Status: AC
  Administered 2014-04-25: 0.5 mL via INTRAMUSCULAR
  Filled 2014-04-25: qty 0.5

## 2014-04-25 NOTE — Progress Notes (Signed)
[place] [phone]   [phone]  OFFICE PROGRESS NOTE  [name], MD [phone] [address] [place] [address]  DIAGNOSIS: Stage IIIA (T1b., N2, M0) non-small cell lung cancer, squamous cell carcinoma presented with right upper lobe lung nodule in addition to mediastinal lymphadenopathy diagnosed in November of 2014.  PRIOR THERAPY:  1) Concurrent chemoradiation with weekly carboplatin for AUC of 2 and paclitaxel 45 mg/M2, status post 8 cycles, last dose was given 03/15/2013 with partial response. 2) Consolidation chemotherapy with carboplatin for AUC of 5 and paclitaxel 175 mg/M2 every 3 weeks with Neulasta support, status post 3 cycles. First dose 05/03/2013. Last dose was given 06/14/2013.  CURRENT THERAPY: Observation.  CHEMOTHERAPY INTENT: Control/curative  CURRENT # OF CHEMOTHERAPY CYCLES: 0  CURRENT ANTIEMETICS: Zofran, dexamethasone and Compazine  CURRENT SMOKING STATUS: Former smoker.  ORAL CHEMOTHERAPY AND CONSENT: None  CURRENT BISPHOSPHONATES USE: None  PAIN MANAGEMENT: 0/10  NARCOTICS INDUCED CONSTIPATION: None  LIVING WILL AND CODE STATUS: Full code   INTERVAL HISTORY: Dawn Mercer 69 y.o. female returns to the clinic today for followup visit. She has been on observation for the last 9 months with no significant complaints. The patient is feeling very well today. She denied having any significant chest pain, shortness of breath, cough or hemoptysis. She denied having any nausea or vomiting. She has no significant weight loss or night sweats. She had repeat CT scan of the chest performed recently and she is here for evaluation and discussion of her scan results.  MEDICAL HISTORY: Past Medical History  Diagnosis Date  . 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 Status post radiation therapy 10/1999  . GERD (gastroesophageal reflux disease)   . Hypothyroidism   . IBS (irritable  bowel syndrome)     Hx: of  . COPD (chronic obstructive pulmonary disease)   . Shortness of breath     Hx: of with exertion  . Bronchitis     Hx: of  . Hay fever     Hx: of  . Seizures     Hx: of over 40 years ago  . H/O hiatal hernia   . History of radiation therapy 01/28/13-03/23/13    63 gray to right upper lobe  . 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 Lung cancer, upper lobe dx'd 12/2012    chemo and XRt complete  . Anxiety     ALLERGIES:  is allergic to latex and codeine.  MEDICATIONS:  Current Outpatient Prescriptions  Medication Sig Dispense Refill  . Cholecalciferol (VITAMIN D-3) 5000 UNITS TABS Take 5,000 Units by mouth 2 (two) times a week.    . esomeprazole (NEXIUM) 40 MG capsule Take 40 mg by mouth daily before breakfast.    . HYDROcodone-acetaminophen (NORCO) 5-325 MG per tablet Take 1 tablet by mouth every 6 (six) hours as needed. 150 tablet 0  . levothyroxine (SYNTHROID, LEVOTHROID) 112 MCG tablet Take 112 mcg by mouth daily before breakfast.    . Multiple Vitamins-Minerals (MULTIVITAMIN ADULTS 50+ PO) Take by mouth.    . sucralfate (CARAFATE) 1 GM/10ML suspension Take 1 g by mouth 4 (four) times daily -  with meals and at bedtime.    Marland Kitchen albuterol (PROVENTIL HFA;VENTOLIN HFA) 108 (90 BASE) MCG/ACT inhaler Inhale 1 puff into the lungs every 6 (six) hours as needed for wheezing.    Marland Kitchen ALPRAZolam (XANAX) 0.5 MG tablet Take 0.5 tablets (0.25 mg  total) by mouth 2 (two) times daily as needed for anxiety. (Patient not taking: Reported on 04/25/2014) 150 tablet 0  . gabapentin (NEURONTIN) 100 MG capsule Take 1 capsule (100 mg total) by mouth 3 (three) times daily. (Patient not taking: Reported on 01/27/2014) 90 capsule 1   No current facility-administered medications for this visit.    SURGICAL HISTORY:  Past Surgical History  Procedure Laterality Date  . Laser ablation  04/2004    for VIN III  . Colonoscopy w/ biopsies and polypectomy      Hx: of    . Tubal ligation    . Video bronchoscopy with endobronchial navigation N/A 12/31/2012    Procedure: VIDEO BRONCHOSCOPY WITH ENDOBRONCHIAL NAVIGATION;  Surgeon: Melrose Nakayama, MD;  Location: [place] [facility];  Service: Thoracic;  Laterality: N/A;  . Video bronchoscopy with endobronchial ultrasound N/A 12/31/2012    Procedure: VIDEO BRONCHOSCOPY WITH ENDOBRONCHIAL ULTRASOUND;  Surgeon: Melrose Nakayama, MD;  Location: [place];  Service: Thoracic;  Laterality: N/A;  . Cholecystectomy N/A 08/03/2013    Procedure: LAPAROSCOPIC CHOLECYSTECTOMY;  Surgeon: Harl Bowie, MD;  Location: [place];  Service: General;  Laterality: N/A;    REVIEW OF SYSTEMS:  Constitutional: negative Eyes: negative Ears, nose, mouth, throat, and face: negative Respiratory: negative Cardiovascular: negative Gastrointestinal: negative Genitourinary:negative Integument/breast: negative Hematologic/lymphatic: negative Musculoskeletal:negative Neurological: negative Behavioral/Psych: negative Endocrine: negative Allergic/Immunologic: negative   PHYSICAL EXAMINATION: General appearance: alert, cooperative and no distress Head: Normocephalic, without obvious abnormality, atraumatic Neck: no adenopathy, no JVD, supple, symmetrical, trachea midline and thyroid not enlarged, symmetric, no tenderness/mass/nodules Lymph nodes: Cervical, supraclavicular, and axillary nodes normal. Resp: clear to auscultation bilaterally Back: symmetric, no curvature. ROM normal. No CVA tenderness. Cardio: regular rate and rhythm, S1, S2 normal, no murmur, click, rub or gallop GI: soft, non-tender; bowel sounds normal; no masses,  no organomegaly Extremities: extremities normal, atraumatic, no cyanosis or edema Neurologic: Alert and oriented X 3, normal strength and tone. Normal symmetric reflexes. Normal coordination and gait  ECOG PERFORMANCE STATUS: 1 - Symptomatic but completely ambulatory  Blood pressure 114/63, pulse 73, temperature  97.9 F (36.6 C), temperature source Oral, resp. rate 18, height 5\' 6"  (1.676 m), weight 167 lb 9.6 oz (76.023 kg), SpO2 90 %.  LABORATORY DATA: Lab Results  Component Value Date   WBC 5.0 04/18/2014   HGB 12.9 04/18/2014   HCT 40.9 04/18/2014   MCV 90.1 04/18/2014   PLT 193 04/18/2014      Chemistry      Component Value Date/Time   NA 141 04/18/2014 0921   NA 139 10/04/2013 1051   K 4.3 04/18/2014 0921   K 4.5 10/04/2013 1051   CL 101 10/04/2013 1051   CO2 28 04/18/2014 0921   CO2 26 10/04/2013 1051   BUN 12.7 04/18/2014 0921   BUN 13 10/04/2013 1051   CREATININE 0.9 04/18/2014 0921   CREATININE 0.81 10/04/2013 1051      Component Value Date/Time   CALCIUM 9.5 04/18/2014 0921   CALCIUM 9.4 10/04/2013 1051   ALKPHOS 108 04/18/2014 0921   ALKPHOS 107 10/04/2013 1051   AST 16 04/18/2014 0921   AST 16 10/04/2013 1051   ALT 7 04/18/2014 0921   ALT 9 10/04/2013 1051   BILITOT 0.73 04/18/2014 0921   BILITOT 0.6 10/04/2013 1051       RADIOGRAPHIC STUDIES: Ct Chest W Contrast  04/18/2014   CLINICAL DATA:  Lung cancer diagnosed 11/14. Chemotherapy and radiation therapy complete. Right upper lobe primary. COPD  and shortness of breath. Restaging.  EXAM: CT CHEST WITH CONTRAST  TECHNIQUE: Multidetector CT imaging of the chest was performed during intravenous contrast administration.  CONTRAST:  36mL OMNIPAQUE IOHEXOL 300 MG/ML  SOLN  COMPARISON:  01/10/2014  FINDINGS: Mediastinum/Nodes: No supraclavicular adenopathy. A right-sided Port-A-Cath which terminates at the cavoatrial junction or high right atrium. Advanced aortic and branch vessel atherosclerosis. Normal heart size, without pericardial effusion. Mild lipomatous hypertrophy of the interatrial septum. Multivessel coronary artery atherosclerosis. No central pulmonary embolism, on this non-dedicated study. No mediastinal or hilar adenopathy. Subtle air-fluid level in the thoracic esophagus on image 14.  Lungs/Pleura: Minimal  right pleural thickening is similar. No pleural fluid.  Moderate centrilobular emphysema. Mosaic attenuation of the lung bases is slightly increased and felt to be related to mosaic profusion of air trapping.  Peripheral right upper lobe pulmonary nodule measures 8 x 9 mm on image 20 and is unchanged. Similar adjacent pleural-parenchymal thickening.  The previously described 1-3 mm pulmonary nodules scattered throughout both lungs are improved to resolved. No new or enlarging nodule identified. No lobar consolidation.  Upper abdomen: Normal imaged portions of the liver, spleen, pancreas, stomach, right adrenal gland, and kidneys. Left adrenal nodularity at 1.0 cm has been similar back to 12/17/2012, favoring a benign adenoma. Advanced abdominal aortic and branch vessel atherosclerosis.  Musculoskeletal: Sclerosis about the anterior right fourth rib is increased and likely related healing fracture.  IMPRESSION: 1. No change in size of a spiculated right upper lobe pulmonary nodule. 2. No evidence of the adenopathy to suggest progressive disease. 3. Centrilobular emphysema. Tiny pulmonary nodules detailed on the prior exam are improved to resolved. 4. Healing anterior lateral right fourth rib fracture. 5.  Atherosclerosis, including within the coronary arteries. 6. Left adrenal adenoma. 7. Esophageal air fluid level suggests dysmotility or gastroesophageal reflux.   Electronically Signed   By: Abigail Miyamoto M.D.   On: 04/18/2014 12:05   ASSESSMENT AND PLAN: This is a very pleasant 69 years old white female recently diagnosed with a stage IIIA non-small cell lung cancer currently undergoing concurrent chemoradiation with weekly carboplatin and paclitaxel status post 8 weekly treatment followed by consolidation chemotherapy with carboplatin and paclitaxel status post 3 cycles.  She has been on observation for the last 9 months and feeling very well. The recent CT scan of the chest showed no evidence for disease  progression. I discussed the scan results with the patient today. She was advised to call immediately if she has any concerning symptoms in the interval. The patient voices understanding of current disease status and treatment options and is in agreement with the current care plan.  All questions were answered. The patient knows to call the clinic with any problems, questions or concerns. We can certainly see the patient much sooner if necessary.  Disclaimer: This note was dictated with voice recognition software. Similar sounding words can inadvertently be transcribed and may not be corrected upon review.

## 2014-04-25 NOTE — Progress Notes (Signed)
[place]

## 2014-04-25 NOTE — Telephone Encounter (Signed)
gv and printed appt sched and avs for pt for March thru June

## 2014-05-13 ENCOUNTER — Telehealth: Payer: Self-pay | Admitting: Internal Medicine

## 2014-05-13 NOTE — Telephone Encounter (Signed)
returned call and spoke with patient and r/s flushes....pt ok adn aware....mailed pt appt sches per request

## 2014-05-30 ENCOUNTER — Ambulatory Visit (HOSPITAL_BASED_OUTPATIENT_CLINIC_OR_DEPARTMENT_OTHER): Payer: Medicare Other

## 2014-05-30 VITALS — BP 110/58 | HR 84 | Temp 97.7°F

## 2014-05-30 DIAGNOSIS — Z452 Encounter for adjustment and management of vascular access device: Secondary | ICD-10-CM | POA: Diagnosis not present

## 2014-05-30 DIAGNOSIS — C3411 Malignant neoplasm of upper lobe, right bronchus or lung: Secondary | ICD-10-CM

## 2014-05-30 DIAGNOSIS — Z95828 Presence of other vascular implants and grafts: Secondary | ICD-10-CM

## 2014-05-30 MED ORDER — SODIUM CHLORIDE 0.9 % IJ SOLN
10.0000 mL | INTRAMUSCULAR | Status: DC | PRN
  Administered 2014-05-30: 10 mL via INTRAVENOUS
  Filled 2014-05-30: qty 10

## 2014-05-30 MED ORDER — HEPARIN SOD (PORK) LOCK FLUSH 100 UNIT/ML IV SOLN
500.0000 [IU] | Freq: Once | INTRAVENOUS | Status: AC
  Administered 2014-05-30: 500 [IU] via INTRAVENOUS
  Filled 2014-05-30: qty 5

## 2014-05-30 NOTE — Patient Instructions (Signed)

## 2014-06-09 ENCOUNTER — Encounter: Payer: Self-pay | Admitting: Radiation Oncology

## 2014-06-09 ENCOUNTER — Ambulatory Visit
Admission: RE | Admit: 2014-06-09 | Discharge: 2014-06-09 | Disposition: A | Discharge status: Home / Self Care | Payer: Medicare Other | Source: Ambulatory Visit | Attending: Radiation Oncology | Admitting: Radiation Oncology

## 2014-06-09 VITALS — BP 102/54 | HR 88 | Temp 98.5°F | Resp 20 | Ht 66.0 in | Wt 169.9 lb

## 2014-06-09 DIAGNOSIS — C3411 Malignant neoplasm of upper lobe, right bronchus or lung: Secondary | ICD-10-CM

## 2014-06-09 MED ORDER — ALPRAZOLAM 0.5 MG PO TABS: 0.2500 mg | ORAL_TABLET | Freq: Two times a day (BID) | ORAL | Status: DC | PRN

## 2014-06-09 MED ORDER — LEVOFLOXACIN 500 MG PO TABS: 500.0000 mg | ORAL_TABLET | Freq: Every day | ORAL | Status: DC

## 2014-06-09 NOTE — Progress Notes (Signed)
Radiation Oncology         [id number]) [phone] ________________________________  Name: Dawn Mercer MRN: [id number]  Date: 06/09/2014  DOB: September 17, 1945  Follow-Up Visit Note  CC: [name], MD  Antony Blackbird, MD  Diagnosis: Stage IIIA (T1b., N2, M0) non-small cell lung cancer, squamous cell carcinoma presenting with right upper lobe lung nodule   Interval Since Last Radiation:  15  months  Narrative:  The patient returns today for routine follow-up. s/p radiation to Right upper lung 12./4/14-/27/15, appetite good, still coughing up greenish phlegm states that's been going on for months, no nausea, energy level poor, pain in right shoulder past 3 weeks she believes she pulled muscle when reaching back to get her Bible in back seat of her car,"it popped" will go to her Primary MD. Takes 1-2 tabs of hydrocodone daily.  ALLERGIES:  is allergic to latex and codeine.  Meds: Current Outpatient Prescriptions  Medication Sig Dispense Refill  . albuterol (PROVENTIL HFA;VENTOLIN HFA) 108 (90 BASE) MCG/ACT inhaler Inhale 1 puff into the lungs every 6 (six) hours as needed for wheezing.    . Cholecalciferol (VITAMIN D-3) 5000 UNITS TABS Take 5,000 Units by mouth 2 (two) times a week.    . esomeprazole (NEXIUM) 40 MG capsule Take 40 mg by mouth daily before breakfast.    . HYDROcodone-acetaminophen (NORCO) 5-325 MG per tablet Take 1 tablet by mouth every 6 (six) hours as needed. 150 tablet 0  . levothyroxine (SYNTHROID, LEVOTHROID) 112 MCG tablet Take 112 mcg by mouth daily before breakfast.    . Multiple Vitamins-Minerals (MULTIVITAMIN ADULTS 50+ PO) Take by mouth.    Marland Kitchen omeprazole (PRILOSEC) 40 MG capsule Take 40 mg by mouth daily.    Marland Kitchen ALPRAZolam (XANAX) 0.5 MG tablet Take 0.5 tablets (0.25 mg total) by mouth 2 (two) times daily as needed for anxiety. (Patient not taking: Reported on 04/25/2014) 150 tablet 0  . gabapentin (NEURONTIN) 100 MG capsule Take 1 capsule (100 mg total) by mouth 3  (three) times daily. (Patient not taking: Reported on 01/27/2014) 90 capsule 1  . sucralfate (CARAFATE) 1 GM/10ML suspension Take 1 g by mouth 4 (four) times daily -  with meals and at bedtime.     No current facility-administered medications for this encounter.    Physical Findings: The patient is in no acute distress. Patient is alert and oriented.  height is 5\' 6"  (1.676 m) and weight is 169 lb 14.4 oz (77.066 kg). Her oral temperature is 98.5 F (36.9 C). Her blood pressure is 102/54 and her pulse is 88. Her respiration is 20 and oxygen saturation is 95%. .  No palpable subclavicular or axillary adenopathy. The lungs are clear to auscultation. The heart has regular rhythm and rate. Throat clear.  Lab Findings: Lab Results  Component Value Date   WBC 5.0 04/18/2014   HGB 12.9 04/18/2014   HCT 40.9 04/18/2014   MCV 90.1 04/18/2014   PLT 193 04/18/2014    Radiographic Findings: No results found.  Impression:  No evidence recurrence on recent CT scan and clinical exam today  Plan:  Routine followup in between medical oncology followups. Prescribed Levaquin for probable bronchitis/early pneumonia and refill xanax.  This document serves as a record of services personally performed by Gery Pray, MD. It was created on his behalf by Pearlie Oyster, a trained medical scribe. The creation of this record is based on the scribe's personal observations and the provider's statements to them. This document has been checked and  approved by the attending provider.     ____________________________________

## 2014-06-09 NOTE — Progress Notes (Addendum)
Follow up  S/p radiation to Right upper lung 12./4/15-/27/16, appetite good, still coughing up greenish phelgm states that's been going on for months, no nausea, energy level poor, pain in right shoulder past 3 weeks she believes she pulled muscle when reaching back to get  Her Bible in back seat of her car,it popped" will go to her Primary MD 10:19 AM  BP 102/54 mmHg  Pulse 88  Temp(Src) 98.5 F (36.9 C) (Oral)  Resp 20  Ht 5\' 6"  (1.676 m)  Wt 169 lb 14.4 oz (77.066 kg)  BMI 27.44 kg/m2  SpO2 95%  Wt Readings from Last 3 Encounters:  04/25/14 167 lb 9.6 oz (76.023 kg)  01/17/14 166 lb 6.4 oz (75.479 kg)  11/30/13 169 lb (76.658 kg)

## 2014-06-14 ENCOUNTER — Other Ambulatory Visit: Payer: Self-pay | Admitting: Internal Medicine

## 2014-06-14 DIAGNOSIS — C3411 Malignant neoplasm of upper lobe, right bronchus or lung: Secondary | ICD-10-CM

## 2014-06-16 ENCOUNTER — Ambulatory Visit: Payer: Medicare Other | Admitting: Radiation Oncology

## 2014-06-27 ENCOUNTER — Telehealth: Payer: Self-pay | Admitting: *Deleted

## 2014-06-27 NOTE — Telephone Encounter (Signed)
Called patient back, informed her Dr. Sondra Come out all week, suggested she try mucinex, for cough,  clariton or zyrtec for runny nose, drink plenty fluids, if not better in a couple days or fever call primary MD 4:11 PM

## 2014-06-27 NOTE — Telephone Encounter (Signed)
Patient called requesting to speak with Elmo Putt, RN, or Dr. Sondra Come, she has another cold given to her by her husband, coughing and runny nose, wants a refill on her levaquin , infomred her that Delia Chimes is out of the office until Wednesday and Dr. Sondra Come out of office will in basket MD and Delia Chimes 4:07 PM

## 2014-07-11 ENCOUNTER — Ambulatory Visit (HOSPITAL_BASED_OUTPATIENT_CLINIC_OR_DEPARTMENT_OTHER): Payer: Medicare Other

## 2014-07-11 DIAGNOSIS — C3411 Malignant neoplasm of upper lobe, right bronchus or lung: Secondary | ICD-10-CM | POA: Diagnosis not present

## 2014-07-11 DIAGNOSIS — Z452 Encounter for adjustment and management of vascular access device: Secondary | ICD-10-CM | POA: Diagnosis not present

## 2014-07-11 DIAGNOSIS — Z95828 Presence of other vascular implants and grafts: Secondary | ICD-10-CM

## 2014-07-11 MED ORDER — HEPARIN SOD (PORK) LOCK FLUSH 100 UNIT/ML IV SOLN
500.0000 [IU] | Freq: Once | INTRAVENOUS | Status: AC
  Administered 2014-07-11: 500 [IU] via INTRAVENOUS
  Filled 2014-07-11: qty 5

## 2014-07-11 MED ORDER — SODIUM CHLORIDE 0.9 % IJ SOLN
10.0000 mL | INTRAMUSCULAR | Status: DC | PRN
  Administered 2014-07-11: 10 mL via INTRAVENOUS
  Filled 2014-07-11: qty 10

## 2014-07-11 NOTE — Patient Instructions (Signed)

## 2014-07-15 ENCOUNTER — Telehealth: Payer: Self-pay | Admitting: *Deleted

## 2014-07-15 NOTE — Telephone Encounter (Signed)
Oncology Nurse Navigator Documentation  Oncology Nurse Navigator Flowsheets 07/15/2014  Navigator Encounter Type 6 month  Patient Visit Type Follow-up.  Called patient today to follow up.  She is on observation.  I was unable to leave a vm message  Treatment Phase Other  Support Groups/Services Friends and Family  Time Spent with Patient 5

## 2014-08-01 ENCOUNTER — Telehealth: Payer: Self-pay | Admitting: Internal Medicine

## 2014-08-01 NOTE — Telephone Encounter (Signed)
pt called to confirm appts...done....pt ok and aware

## 2014-08-22 ENCOUNTER — Other Ambulatory Visit (HOSPITAL_BASED_OUTPATIENT_CLINIC_OR_DEPARTMENT_OTHER): Payer: Medicare Other

## 2014-08-22 ENCOUNTER — Ambulatory Visit (HOSPITAL_COMMUNITY)
Admission: RE | Admit: 2014-08-22 | Discharge: 2014-08-22 | Disposition: A | Discharge status: Home / Self Care | Payer: Medicare Other | Source: Ambulatory Visit | Attending: Internal Medicine | Admitting: Internal Medicine

## 2014-08-22 ENCOUNTER — Other Ambulatory Visit: Payer: Self-pay

## 2014-08-22 ENCOUNTER — Ambulatory Visit (HOSPITAL_BASED_OUTPATIENT_CLINIC_OR_DEPARTMENT_OTHER): Payer: Medicare Other

## 2014-08-22 DIAGNOSIS — Z08 Encounter for follow-up examination after completed treatment for malignant neoplasm: Secondary | ICD-10-CM | POA: Diagnosis not present

## 2014-08-22 DIAGNOSIS — C3411 Malignant neoplasm of upper lobe, right bronchus or lung: Secondary | ICD-10-CM

## 2014-08-22 DIAGNOSIS — Z452 Encounter for adjustment and management of vascular access device: Secondary | ICD-10-CM

## 2014-08-22 DIAGNOSIS — J439 Emphysema, unspecified: Secondary | ICD-10-CM | POA: Diagnosis not present

## 2014-08-22 DIAGNOSIS — D3502 Benign neoplasm of left adrenal gland: Secondary | ICD-10-CM | POA: Diagnosis not present

## 2014-08-22 DIAGNOSIS — Z95828 Presence of other vascular implants and grafts: Secondary | ICD-10-CM

## 2014-08-22 LAB — CBC WITH DIFFERENTIAL/PLATELET
BASO%: 0.6 % (ref 0.0–2.0)
BASOS ABS: 0 10*3/uL (ref 0.0–0.1)
EOS ABS: 0.1 10*3/uL (ref 0.0–0.5)
EOS%: 1.5 % (ref 0.0–7.0)
HEMATOCRIT: 40.6 % (ref 34.8–46.6)
HEMOGLOBIN: 13.2 g/dL (ref 11.6–15.9)
LYMPH%: 17.7 % (ref 14.0–49.7)
MCH: 28.1 pg (ref 25.1–34.0)
MCHC: 32.5 g/dL (ref 31.5–36.0)
MCV: 86.3 fL (ref 79.5–101.0)
MONO#: 0.6 10*3/uL (ref 0.1–0.9)
MONO%: 8.8 % (ref 0.0–14.0)
NEUT#: 5.3 10*3/uL (ref 1.5–6.5)
NEUT%: 71.4 % (ref 38.4–76.8)
PLATELETS: 169 10*3/uL (ref 145–400)
RBC: 4.7 10*6/uL (ref 3.70–5.45)
RDW: 17.3 % — ABNORMAL HIGH (ref 11.2–14.5)
WBC: 7.4 10*3/uL (ref 3.9–10.3)
lymph#: 1.3 10*3/uL (ref 0.9–3.3)

## 2014-08-22 LAB — COMPREHENSIVE METABOLIC PANEL (CC13)
ALBUMIN: 3.2 g/dL — AB (ref 3.5–5.0)
ALK PHOS: 107 U/L (ref 40–150)
ALT: 15 U/L (ref 0–55)
ANION GAP: 7 meq/L (ref 3–11)
AST: 15 U/L (ref 5–34)
BUN: 13.1 mg/dL (ref 7.0–26.0)
CALCIUM: 9.2 mg/dL (ref 8.4–10.4)
CO2: 29 meq/L (ref 22–29)
Chloride: 103 mEq/L (ref 98–109)
Creatinine: 0.8 mg/dL (ref 0.6–1.1)
EGFR: 79 mL/min/{1.73_m2} — ABNORMAL LOW (ref >90)
GLUCOSE: 86 mg/dL (ref 70–140)
POTASSIUM: 4.3 meq/L (ref 3.5–5.1)
SODIUM: 139 meq/L (ref 136–145)
TOTAL PROTEIN: 6.8 g/dL (ref 6.4–8.3)
Total Bilirubin: 0.72 mg/dL (ref 0.20–1.20)

## 2014-08-22 MED ORDER — IOHEXOL 300 MG/ML  SOLN
75.0000 mL | Freq: Once | INTRAMUSCULAR | Status: AC | PRN
  Administered 2014-08-22: 75 mL via INTRAVENOUS

## 2014-08-22 MED ORDER — SODIUM CHLORIDE 0.9 % IJ SOLN
10.0000 mL | INTRAMUSCULAR | Status: DC | PRN
  Administered 2014-08-22: 10 mL via INTRAVENOUS
  Filled 2014-08-22: qty 10

## 2014-08-22 NOTE — Patient Instructions (Signed)
Please have Port-A-cath decaccessed after CT scan today.   Implanted [facility] Guide An implanted port is a type of central line that is placed under the skin. Central lines are used to provide IV access when treatment or nutrition needs to be given through a person's veins. Implanted ports are used for long-term IV access. An implanted port may be placed because:   You need IV medicine that would be irritating to the small veins in your hands or arms.   You need long-term IV medicines, such as antibiotics.   You need IV nutrition for a long period.   You need frequent blood draws for lab tests.   You need dialysis.  Implanted ports are usually placed in the chest area, but they can also be placed in the upper arm, the abdomen, or the leg. An implanted port has two main parts:   Reservoir. The reservoir is round and will appear as a small, raised area under your skin. The reservoir is the part where a needle is inserted to give medicines or draw blood.   Catheter. The catheter is a thin, flexible tube that extends from the reservoir. The catheter is placed into a large vein. Medicine that is inserted into the reservoir goes into the catheter and then into the vein.  HOW WILL I CARE FOR MY INCISION SITE? Do not get the incision site wet. Bathe or shower as directed by your health care provider.  HOW IS MY PORT ACCESSED? Special steps must be taken to access the port:   Before the port is accessed, a numbing cream can be placed on the skin. This helps numb the skin over the port site.   Your health care provider uses a sterile technique to access the port.  Your health care provider must put on a mask and sterile gloves.  The skin over your port is cleaned carefully with an antiseptic and allowed to dry.  The port is gently pinched between sterile gloves, and a needle is inserted into the port.  Only "non-coring" port needles should be used to access the port. Once the port is  accessed, a blood return should be checked. This helps ensure that the port is in the vein and is not clogged.   If your port needs to remain accessed for a constant infusion, a clear (transparent) bandage will be placed over the needle site. The bandage and needle will need to be changed every week, or as directed by your health care provider.   Keep the bandage covering the needle clean and dry. Do not get it wet. Follow your health care provider's instructions on how to take a shower or bath while the port is accessed.   If your port does not need to stay accessed, no bandage is needed over the port.  WHAT IS FLUSHING? Flushing helps keep the port from getting clogged. Follow your health care provider's instructions on how and when to flush the port. Ports are usually flushed with saline solution or a medicine called heparin. The need for flushing will depend on how the port is used.   If the port is used for intermittent medicines or blood draws, the port will need to be flushed:   After medicines have been given.   After blood has been drawn.   As part of routine maintenance.   If a constant infusion is running, the port may not need to be flushed.  HOW LONG WILL MY PORT STAY IMPLANTED? The port  can stay in for as long as your health care provider thinks it is needed. When it is time for the port to come out, surgery will be done to remove it. The procedure is similar to the one performed when the port was put in.  WHEN SHOULD I SEEK IMMEDIATE MEDICAL CARE? When you have an implanted port, you should seek immediate medical care if:   You notice a bad smell coming from the incision site.   You have swelling, redness, or drainage at the incision site.   You have more swelling or pain at the port site or the surrounding area.   You have a fever that is not controlled with medicine. Document Released: 02/11/2005 Document Revised: 12/02/2012 Document Reviewed:  10/19/2012 [facility] Patient Information 2015 [place], [place]. This information is not intended to replace advice given to you by your health care provider. Make sure you discuss any questions you have with your health care provider.

## 2014-08-30 ENCOUNTER — Ambulatory Visit (HOSPITAL_BASED_OUTPATIENT_CLINIC_OR_DEPARTMENT_OTHER): Payer: Medicare Other | Admitting: Internal Medicine

## 2014-08-30 ENCOUNTER — Telehealth: Payer: Self-pay | Admitting: Internal Medicine

## 2014-08-30 ENCOUNTER — Encounter: Payer: Self-pay | Admitting: Internal Medicine

## 2014-08-30 VITALS — BP 117/52 | HR 69 | Temp 98.5°F | Resp 18 | Ht 66.0 in | Wt 178.3 lb

## 2014-08-30 DIAGNOSIS — G620 Drug-induced polyneuropathy: Secondary | ICD-10-CM

## 2014-08-30 DIAGNOSIS — C3411 Malignant neoplasm of upper lobe, right bronchus or lung: Secondary | ICD-10-CM | POA: Diagnosis not present

## 2014-08-30 DIAGNOSIS — G622 Polyneuropathy due to other toxic agents: Secondary | ICD-10-CM | POA: Diagnosis not present

## 2014-08-30 DIAGNOSIS — T451X5A Adverse effect of antineoplastic and immunosuppressive drugs, initial encounter: Secondary | ICD-10-CM

## 2014-08-30 MED ORDER — GABAPENTIN 100 MG PO CAPS: 100.0000 mg | ORAL_CAPSULE | Freq: Three times a day (TID) | ORAL | Status: DC

## 2014-08-30 NOTE — Progress Notes (Signed)
[place] [phone]   [phone]  OFFICE PROGRESS NOTE  [name], MD [phone] [address], [place] [address]  DIAGNOSIS: Stage IIIA (T1b., N2, M0) non-small cell lung cancer, squamous cell carcinoma presented with right upper lobe lung nodule in addition to mediastinal lymphadenopathy diagnosed in November of 2014.  PRIOR THERAPY:  1) Concurrent chemoradiation with weekly carboplatin for AUC of 2 and paclitaxel 45 mg/M2, status post 8 cycles, last dose was given 03/15/2013 with partial response. 2) Consolidation chemotherapy with carboplatin for AUC of 5 and paclitaxel 175 mg/M2 every 3 weeks with Neulasta support, status post 3 cycles. First dose 05/03/2013. Last dose was given 06/14/2013.  CURRENT THERAPY: Observation.  CHEMOTHERAPY INTENT: Control/curative  CURRENT # OF CHEMOTHERAPY CYCLES: 0  CURRENT ANTIEMETICS: Zofran, dexamethasone and Compazine  CURRENT SMOKING STATUS: Former smoker.  ORAL CHEMOTHERAPY AND CONSENT: None  CURRENT BISPHOSPHONATES USE: None  PAIN MANAGEMENT: 0/10  NARCOTICS INDUCED CONSTIPATION: None  LIVING WILL AND CODE STATUS: Full code   INTERVAL HISTORY: Dawn Mercer 69 y.o. female returns to the clinic today for followup visit accompanied by her husband. She has been on observation for the last 12 months with no significant complaints. The patient is feeling very well today. She denied having any significant chest pain, shortness of breath, cough or hemoptysis. She denied having any nausea or vomiting. She has no significant weight loss or night sweats. She had repeat CT scan of the chest performed recently and she is here for evaluation and discussion of her scan results.  MEDICAL HISTORY: Past Medical History  Diagnosis Date  . 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 Status post radiation therapy 10/1999  . GERD (gastroesophageal reflux disease)   .  Hypothyroidism   . IBS (irritable bowel syndrome)     Hx: of  . COPD (chronic obstructive pulmonary disease)   . Shortness of breath     Hx: of with exertion  . Bronchitis     Hx: of  . Hay fever     Hx: of  . Seizures     Hx: of over 40 years ago  . H/O hiatal hernia   . History of radiation therapy 01/28/13-03/23/13    63 gray to right upper lobe  . 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 Lung cancer, upper lobe dx'd 12/2012    chemo and XRt complete  . Anxiety     ALLERGIES:  is allergic to latex and codeine.  MEDICATIONS:  Current Outpatient Prescriptions  Medication Sig Dispense Refill  . albuterol (PROVENTIL HFA;VENTOLIN HFA) 108 (90 BASE) MCG/ACT inhaler Inhale 1 puff into the lungs every 6 (six) hours as needed for wheezing.    Marland Kitchen ALPRAZolam (XANAX) 0.5 MG tablet Take 0.5 tablets (0.25 mg total) by mouth 2 (two) times daily as needed for anxiety. 90 tablet 0  . Cholecalciferol (VITAMIN D-3) 5000 UNITS TABS Take 5,000 Units by mouth 2 (two) times a week.    . esomeprazole (NEXIUM) 40 MG capsule Take 40 mg by mouth daily before breakfast.    . gabapentin (NEURONTIN) 100 MG capsule Take 1 capsule (100 mg total) by mouth 3 (three) times daily. (Patient not taking: Reported on 01/27/2014) 90 capsule 1  . HYDROcodone-acetaminophen (NORCO) 5-325 MG per tablet Take 1 tablet by mouth every 6 (six) hours as needed. 150 tablet 0  . levofloxacin (LEVAQUIN) 500 MG tablet Take 1 tablet (  500 mg total) by mouth daily. 7 tablet 0  . levothyroxine (SYNTHROID, LEVOTHROID) 112 MCG tablet Take 112 mcg by mouth daily before breakfast.    . Multiple Vitamins-Minerals (MULTIVITAMIN ADULTS 50+ PO) Take by mouth.    Marland Kitchen omeprazole (PRILOSEC) 40 MG capsule Take 40 mg by mouth daily.    . sucralfate (CARAFATE) 1 GM/10ML suspension Take 1 g by mouth 4 (four) times daily -  with meals and at bedtime.     No current facility-administered medications for this visit.    SURGICAL  HISTORY:  Past Surgical History  Procedure Laterality Date  . Laser ablation  04/2004    for VIN III  . Colonoscopy w/ biopsies and polypectomy      Hx: of  . Tubal ligation    . Video bronchoscopy with endobronchial navigation N/A 12/31/2012    Procedure: VIDEO BRONCHOSCOPY WITH ENDOBRONCHIAL NAVIGATION;  Surgeon: Melrose Nakayama, MD;  Location: [facility];  Service: Thoracic;  Laterality: N/A;  . Video bronchoscopy with endobronchial ultrasound N/A 12/31/2012    Procedure: VIDEO BRONCHOSCOPY WITH ENDOBRONCHIAL ULTRASOUND;  Surgeon: Melrose Nakayama, MD;  Location: [facility];  Service: Thoracic;  Laterality: N/A;  . Cholecystectomy N/A 08/03/2013    Procedure: LAPAROSCOPIC CHOLECYSTECTOMY;  Surgeon: Harl Bowie, MD;  Location: [facility];  Service: General;  Laterality: N/A;    REVIEW OF SYSTEMS:  Constitutional: negative Eyes: negative Ears, nose, mouth, throat, and face: negative Respiratory: negative Cardiovascular: negative Gastrointestinal: negative Genitourinary:negative Integument/breast: negative Hematologic/lymphatic: negative Musculoskeletal:negative Neurological: negative Behavioral/Psych: negative Endocrine: negative Allergic/Immunologic: negative   PHYSICAL EXAMINATION: General appearance: alert, cooperative and no distress Head: Normocephalic, without obvious abnormality, atraumatic Neck: no adenopathy, no JVD, supple, symmetrical, trachea midline and thyroid not enlarged, symmetric, no tenderness/mass/nodules Lymph nodes: Cervical, supraclavicular, and axillary nodes normal. Resp: clear to auscultation bilaterally Back: symmetric, no curvature. ROM normal. No CVA tenderness. Cardio: regular rate and rhythm, S1, S2 normal, no murmur, click, rub or gallop GI: soft, non-tender; bowel sounds normal; no masses,  no organomegaly Extremities: extremities normal, atraumatic, no cyanosis or edema Neurologic: Alert and oriented X 3, normal strength and tone. Normal  symmetric reflexes. Normal coordination and gait  ECOG PERFORMANCE STATUS: 1 - Symptomatic but completely ambulatory  Blood pressure 117/52, pulse 69, temperature 98.5 F (36.9 C), temperature source Oral, resp. rate 18, height '5\' 6"'$  (1.676 m), weight 178 lb 4.8 oz (80.876 kg), SpO2 93 %.  LABORATORY DATA: Lab Results  Component Value Date   WBC 7.4 08/22/2014   HGB 13.2 08/22/2014   HCT 40.6 08/22/2014   MCV 86.3 08/22/2014   PLT 169 08/22/2014      Chemistry      Component Value Date/Time   NA 139 08/22/2014 1006   NA 139 10/04/2013 1051   K 4.3 08/22/2014 1006   K 4.5 10/04/2013 1051   CL 101 10/04/2013 1051   CO2 29 08/22/2014 1006   CO2 26 10/04/2013 1051   BUN 13.1 08/22/2014 1006   BUN 13 10/04/2013 1051   CREATININE 0.8 08/22/2014 1006   CREATININE 0.81 10/04/2013 1051      Component Value Date/Time   CALCIUM 9.2 08/22/2014 1006   CALCIUM 9.4 10/04/2013 1051   ALKPHOS 107 08/22/2014 1006   ALKPHOS 107 10/04/2013 1051   AST 15 08/22/2014 1006   AST 16 10/04/2013 1051   ALT 15 08/22/2014 1006   ALT 9 10/04/2013 1051   BILITOT 0.72 08/22/2014 1006   BILITOT 0.6 10/04/2013 1051  RADIOGRAPHIC STUDIES: Ct Chest W Contrast  08/22/2014   CLINICAL DATA:  Right upper lobe lung cancer followup  EXAM: CT CHEST WITH CONTRAST  TECHNIQUE: Multidetector CT imaging of the chest was performed during intravenous contrast administration.  CONTRAST:  93m OMNIPAQUE IOHEXOL 300 MG/ML  SOLN  COMPARISON:  04/18/2014  FINDINGS: Mediastinum: The heart size is normal. No pericardial effusion. There is aortic atherosclerosis noted. Calcification at the bifurcation of the left main coronary artery noted. The trachea is patent and appears midline. Normal appearance of the esophagus.  Lungs/Pleura: There is no pleural effusion. Advanced changes of centrilobular and paraseptal emphysema noted. Right upper lobe pulmonary nodule measures 0.9 cm, image 19/ series 6. Previously 0.9 cm. No  new pulmonary nodules identified.  Upper Abdomen: There is no suspicious liver abnormality. Previous cholecystectomy. No biliary dilatation. The visualized portions of the pancreas and spleen are normal. Stable left adrenal nodule measuring 1.4 x 1.0 cm  Musculoskeletal: No aggressive lytic or sclerotic bone lesions. Sclerosis involving the anterior right fourth rib is again noted and is likely related to healing fracture.  IMPRESSION: 1. No significant change in size of spiculated right upper lobe pulmonary nodule. 2. No new or progressive disease identified. 3. Aortic atherosclerosis 4. Emphysema. 5. Stable left adrenal adenoma.   Electronically Signed   By: TKerby MoorsM.D.   On: 08/22/2014 12:40   ASSESSMENT AND PLAN: This is a very pleasant 69years old white female recently diagnosed with a stage IIIA non-small cell lung cancer currently undergoing concurrent chemoradiation with weekly carboplatin and paclitaxel status post 8 weekly treatment followed by consolidation chemotherapy with carboplatin and paclitaxel status post 3 cycles.  She has been on observation for the last 12 months and feeling very well. The recent CT scan of the chest showed no evidence for disease progression. I discussed the scan results with the patient today. I will see her back for follow-up visit in 4 months with repeat CT scan of the chest. She will continue to have Port-A-Cath flush every 2 months. She was advised to call immediately if she has any concerning symptoms in the interval. The patient voices understanding of current disease status and treatment options and is in agreement with the current care plan.  All questions were answered. The patient knows to call the clinic with any problems, questions or concerns. We can certainly see the patient much sooner if necessary.  Disclaimer: This note was dictated with voice recognition software. Similar sounding words can inadvertently be transcribed and may not be  corrected upon review.

## 2014-08-30 NOTE — Telephone Encounter (Signed)
Pt confirmed labs/ov per 07/05 POF, gave pt AVS and Calendar.... KJ

## 2014-10-03 ENCOUNTER — Ambulatory Visit (HOSPITAL_BASED_OUTPATIENT_CLINIC_OR_DEPARTMENT_OTHER): Payer: Medicare Other

## 2014-10-03 VITALS — BP 108/64 | HR 66 | Temp 98.4°F | Resp 16

## 2014-10-03 DIAGNOSIS — C3411 Malignant neoplasm of upper lobe, right bronchus or lung: Secondary | ICD-10-CM | POA: Diagnosis not present

## 2014-10-03 DIAGNOSIS — Z452 Encounter for adjustment and management of vascular access device: Secondary | ICD-10-CM

## 2014-10-03 DIAGNOSIS — Z95828 Presence of other vascular implants and grafts: Secondary | ICD-10-CM

## 2014-10-03 MED ORDER — HEPARIN SOD (PORK) LOCK FLUSH 100 UNIT/ML IV SOLN
500.0000 [IU] | Freq: Once | INTRAVENOUS | Status: AC
  Administered 2014-10-03: 500 [IU] via INTRAVENOUS
  Filled 2014-10-03: qty 5

## 2014-10-03 MED ORDER — SODIUM CHLORIDE 0.9 % IJ SOLN
10.0000 mL | INTRAMUSCULAR | Status: DC | PRN
  Administered 2014-10-03: 10 mL via INTRAVENOUS
  Filled 2014-10-03: qty 10

## 2014-10-03 NOTE — Patient Instructions (Signed)

## 2014-10-12 ENCOUNTER — Encounter: Payer: Self-pay | Admitting: Radiation Oncology

## 2014-10-12 ENCOUNTER — Ambulatory Visit
Admission: RE | Admit: 2014-10-12 | Discharge: 2014-10-12 | Disposition: A | Discharge status: Home / Self Care | Payer: Medicare Other | Source: Ambulatory Visit | Attending: Radiation Oncology | Admitting: Radiation Oncology

## 2014-10-12 VITALS — BP 119/74 | HR 69 | Temp 98.0°F | Resp 20 | Ht 66.0 in | Wt 180.8 lb

## 2014-10-12 DIAGNOSIS — C3411 Malignant neoplasm of upper lobe, right bronchus or lung: Secondary | ICD-10-CM

## 2014-10-12 MED ORDER — ALPRAZOLAM 0.5 MG PO TABS: 0.2500 mg | ORAL_TABLET | Freq: Two times a day (BID) | ORAL | Status: DC | PRN

## 2014-10-12 MED ORDER — HYDROCODONE-ACETAMINOPHEN 5-325 MG PO TABS: 1.0000 | ORAL_TABLET | Freq: Four times a day (QID) | ORAL | Status: DC | PRN

## 2014-10-12 NOTE — Progress Notes (Signed)
Dawn Mercer here for follow up.  She reports her breathing is better and has not had to use her inhaler in about a month.  She reports an occasional cough with green sputum.  She denies hemoptysis.  She denies trouble swallowing   She is requesting a refill on xanax and norco.  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  She is taking 2-3 norco per day.  She reports her energy level is a little better.  She reports she can walk a little further now.  BP 119/74 mmHg  Pulse 69  Temp(Src) 98 F (36.7 C) (Oral)  Resp 20  Ht '5\' 6"'$  (1.676 m)  Wt 180 lb 12.8 oz (82.01 kg)  BMI 29.20 kg/m2  SpO2 95%

## 2014-10-12 NOTE — Progress Notes (Signed)
Radiation Oncology         [phone]) [phone] ________________________________  Name: Dawn Mercer MRN: [id number]  Date: 10/12/2014  DOB: [birth year]    Follow-Up Visit Note  CC: [name], MD  Antony Blackbird, MD  Diagnosis: Stage IIIA (T1b., N2, M0) non-small cell lung cancer, squamous cell carcinoma presenting with right upper lobe lung nodule  Interval Since Last Radiation:  18  months  Narrative:  The patient returns today for routine follow-up. She reports her breathing is better and has not had to use her inhaler in about a month. She reports an occasional cough with green sputum. She denies hemoptysis. She denies trouble swallowing She is requesting a refill on xanax and norco. 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 She is taking 2-3 norco per day, sometimes none on other days.  She reports her energy level is a little better. She reports she can walk a little further now. She denies chest pain. She denies vaginal bleeding, headaches, dizziness, and hemoptysis. She reports weight gain. She is considering changing PCPs.    ALLERGIES:  is allergic to latex and codeine.  Meds: Current Outpatient Prescriptions  Medication Sig Dispense Refill  . ALPRAZolam (XANAX) 0.5 MG tablet Take 0.5 tablets (0.25 mg total) by mouth 2 (two) times daily as needed for anxiety. 90 tablet 0  . Cholecalciferol (VITAMIN D-3) 5000 UNITS TABS Take 5,000 Units by mouth 2 (two) times a week.    . DEXILANT 60 MG capsule     . gabapentin (NEURONTIN) 100 MG capsule Take 1 capsule (100 mg total) by mouth 3 (three) times daily. 90 capsule 1  . HYDROcodone-acetaminophen (NORCO) 5-325 MG per tablet Take 1 tablet by mouth every 6 (six) hours as needed. 150 tablet 0  . levothyroxine (SYNTHROID, LEVOTHROID) 112 MCG tablet Take 112 mcg by mouth daily before breakfast.    . albuterol (PROVENTIL HFA;VENTOLIN HFA) 108 (90 BASE) MCG/ACT inhaler Inhale 1 puff into the lungs  every 6 (six) hours as needed for wheezing.    Marland Kitchen esomeprazole (NEXIUM) 40 MG capsule Take 40 mg by mouth daily before breakfast.    . Multiple Vitamins-Minerals (MULTIVITAMIN ADULTS 50+ PO) Take by mouth.    Marland Kitchen omeprazole (PRILOSEC) 40 MG capsule Take 40 mg by mouth daily.     No current facility-administered medications for this encounter.    Physical Findings: The patient is in no acute distress. Patient is alert and oriented.  height is '5\' 6"'$  (1.676 m) and weight is 180 lb 12.8 oz (82.01 kg). Her oral temperature is 98 F (36.7 C). Her blood pressure is 119/74 and her pulse is 69. Her respiration is 20 and oxygen saturation is 95%. .  No palpable supraclavicular or axillary adenopathy. The lungs are clear to auscultation. The heart has regular rhythm and rate. Throat clear. Mild wheezing noted on the left side.   Lab Findings: Lab Results  Component Value Date   WBC 7.4 08/22/2014   HGB 13.2 08/22/2014   HCT 40.6 08/22/2014   MCV 86.3 08/22/2014   PLT 169 08/22/2014    Radiographic Findings: No results found.  Impression:  No evidence recurrence on recent CT scan and clinical exam today.   Plan:  I have refilled her norco and xanax today. She will follow up with medical oncology in November with a repeat CAT scan scheduled. Follow up in radiation oncology in January.   This document serves as a record of  services personally performed by Gery Pray, MD. It was created on his behalf by Arlyce Harman, a trained medical scribe. The creation of this record is based on the scribe's personal observations and the provider's statements to them. This document has been checked and approved by the attending provider.   -----------------------------------  Blair Promise, PhD, MD

## 2014-11-14 ENCOUNTER — Ambulatory Visit (HOSPITAL_BASED_OUTPATIENT_CLINIC_OR_DEPARTMENT_OTHER): Payer: Medicare Other

## 2014-11-14 DIAGNOSIS — C3411 Malignant neoplasm of upper lobe, right bronchus or lung: Secondary | ICD-10-CM | POA: Diagnosis not present

## 2014-11-14 DIAGNOSIS — Z452 Encounter for adjustment and management of vascular access device: Secondary | ICD-10-CM

## 2014-11-14 DIAGNOSIS — Z95828 Presence of other vascular implants and grafts: Secondary | ICD-10-CM

## 2014-11-14 MED ORDER — SODIUM CHLORIDE 0.9 % IJ SOLN
10.0000 mL | INTRAMUSCULAR | Status: DC | PRN
  Administered 2014-11-14: 10 mL via INTRAVENOUS
  Filled 2014-11-14: qty 10

## 2014-11-14 MED ORDER — HEPARIN SOD (PORK) LOCK FLUSH 100 UNIT/ML IV SOLN
500.0000 [IU] | Freq: Once | INTRAVENOUS | Status: AC
  Administered 2014-11-14: 500 [IU] via INTRAVENOUS
  Filled 2014-11-14: qty 5

## 2014-11-14 NOTE — Patient Instructions (Signed)

## 2014-12-21 ENCOUNTER — Other Ambulatory Visit: Payer: Self-pay | Admitting: Gastroenterology

## 2014-12-28 ENCOUNTER — Other Ambulatory Visit (HOSPITAL_BASED_OUTPATIENT_CLINIC_OR_DEPARTMENT_OTHER): Payer: Medicare Other

## 2014-12-28 ENCOUNTER — Other Ambulatory Visit: Payer: Self-pay | Admitting: Medical Oncology

## 2014-12-28 ENCOUNTER — Ambulatory Visit (HOSPITAL_BASED_OUTPATIENT_CLINIC_OR_DEPARTMENT_OTHER): Payer: Medicare Other

## 2014-12-28 ENCOUNTER — Ambulatory Visit (HOSPITAL_COMMUNITY)
Admission: RE | Admit: 2014-12-28 | Discharge: 2014-12-28 | Disposition: A | Discharge status: Home / Self Care | Payer: Medicare Other | Source: Ambulatory Visit | Attending: Internal Medicine | Admitting: Internal Medicine

## 2014-12-28 ENCOUNTER — Encounter (HOSPITAL_COMMUNITY): Payer: Self-pay

## 2014-12-28 DIAGNOSIS — C3410 Malignant neoplasm of upper lobe, unspecified bronchus or lung: Secondary | ICD-10-CM

## 2014-12-28 DIAGNOSIS — J439 Emphysema, unspecified: Secondary | ICD-10-CM | POA: Insufficient documentation

## 2014-12-28 DIAGNOSIS — Z452 Encounter for adjustment and management of vascular access device: Secondary | ICD-10-CM

## 2014-12-28 DIAGNOSIS — I700 Atherosclerosis of aorta: Secondary | ICD-10-CM | POA: Insufficient documentation

## 2014-12-28 DIAGNOSIS — I2510 Atherosclerotic heart disease of native coronary artery without angina pectoris: Secondary | ICD-10-CM | POA: Diagnosis not present

## 2014-12-28 DIAGNOSIS — K760 Fatty (change of) liver, not elsewhere classified: Secondary | ICD-10-CM | POA: Diagnosis not present

## 2014-12-28 DIAGNOSIS — Z95828 Presence of other vascular implants and grafts: Secondary | ICD-10-CM

## 2014-12-28 DIAGNOSIS — R590 Localized enlarged lymph nodes: Secondary | ICD-10-CM | POA: Diagnosis not present

## 2014-12-28 DIAGNOSIS — C3411 Malignant neoplasm of upper lobe, right bronchus or lung: Secondary | ICD-10-CM

## 2014-12-28 DIAGNOSIS — D3502 Benign neoplasm of left adrenal gland: Secondary | ICD-10-CM | POA: Diagnosis not present

## 2014-12-28 LAB — COMPREHENSIVE METABOLIC PANEL (CC13)
ALT: 10 U/L (ref 0–55)
ANION GAP: 7 meq/L (ref 3–11)
AST: 14 U/L (ref 5–34)
Albumin: 3.3 g/dL — ABNORMAL LOW (ref 3.5–5.0)
Alkaline Phosphatase: 113 U/L (ref 40–150)
BILIRUBIN TOTAL: 0.75 mg/dL (ref 0.20–1.20)
BUN: 9.4 mg/dL (ref 7.0–26.0)
CALCIUM: 9.5 mg/dL (ref 8.4–10.4)
CHLORIDE: 104 meq/L (ref 98–109)
CO2: 28 mEq/L (ref 22–29)
CREATININE: 0.8 mg/dL (ref 0.6–1.1)
EGFR: 82 mL/min/{1.73_m2} — ABNORMAL LOW (ref >90)
Glucose: 88 mg/dl (ref 70–140)
Potassium: 4.3 mEq/L (ref 3.5–5.1)
Sodium: 139 mEq/L (ref 136–145)
Total Protein: 7.1 g/dL (ref 6.4–8.3)

## 2014-12-28 LAB — CBC WITH DIFFERENTIAL/PLATELET
BASO%: 0.5 % (ref 0.0–2.0)
Basophils Absolute: 0 10*3/uL (ref 0.0–0.1)
EOS ABS: 0.2 10*3/uL (ref 0.0–0.5)
EOS%: 3.2 % (ref 0.0–7.0)
HEMATOCRIT: 42.5 % (ref 34.8–46.6)
HEMOGLOBIN: 13.9 g/dL (ref 11.6–15.9)
LYMPH%: 18.9 % (ref 14.0–49.7)
MCH: 28.8 pg (ref 25.1–34.0)
MCHC: 32.8 g/dL (ref 31.5–36.0)
MCV: 87.7 fL (ref 79.5–101.0)
MONO#: 0.6 10*3/uL (ref 0.1–0.9)
MONO%: 8.6 % (ref 0.0–14.0)
NEUT%: 68.8 % (ref 38.4–76.8)
NEUTROS ABS: 4.8 10*3/uL (ref 1.5–6.5)
PLATELETS: 223 10*3/uL (ref 145–400)
RBC: 4.85 10*6/uL (ref 3.70–5.45)
RDW: 15.2 % — AB (ref 11.2–14.5)
WBC: 7 10*3/uL (ref 3.9–10.3)
lymph#: 1.3 10*3/uL (ref 0.9–3.3)

## 2014-12-28 MED ORDER — SODIUM CHLORIDE 0.9 % IJ SOLN
10.0000 mL | INTRAMUSCULAR | Status: DC | PRN
  Administered 2014-12-28: 10 mL via INTRAVENOUS
  Filled 2014-12-28: qty 10

## 2014-12-28 MED ORDER — IOHEXOL 300 MG/ML  SOLN
75.0000 mL | Freq: Once | INTRAMUSCULAR | Status: AC | PRN
  Administered 2014-12-28: 75 mL via INTRAVENOUS

## 2014-12-28 NOTE — Patient Instructions (Addendum)
Patient to have port needle de-accessed after CT scan today 12/28/14, before leaving [facility].   Implanted [facility] Guide An implanted port is a type of central line that is placed under the skin. Central lines are used to provide IV access when treatment or nutrition needs to be given through a person's veins. Implanted ports are used for long-term IV access. An implanted port may be placed because:   You need IV medicine that would be irritating to the small veins in your hands or arms.   You need long-term IV medicines, such as antibiotics.   You need IV nutrition for a long period.   You need frequent blood draws for lab tests.   You need dialysis.  Implanted ports are usually placed in the chest area, but they can also be placed in the upper arm, the abdomen, or the leg. An implanted port has two main parts:   Reservoir. The reservoir is round and will appear as a small, raised area under your skin. The reservoir is the part where a needle is inserted to give medicines or draw blood.   Catheter. The catheter is a thin, flexible tube that extends from the reservoir. The catheter is placed into a large vein. Medicine that is inserted into the reservoir goes into the catheter and then into the vein.  HOW WILL I CARE FOR MY INCISION SITE? Do not get the incision site wet. Bathe or shower as directed by your health care provider.  HOW IS MY PORT ACCESSED? Special steps must be taken to access the port:   Before the port is accessed, a numbing cream can be placed on the skin. This helps numb the skin over the port site.   Your health care provider uses a sterile technique to access the port.  Your health care provider must put on a mask and sterile gloves.  The skin over your port is cleaned carefully with an antiseptic and allowed to dry.  The port is gently pinched between sterile gloves, and a needle is inserted into the port.  Only "non-coring" port  needles should be used to access the port. Once the port is accessed, a blood return should be checked. This helps ensure that the port is in the vein and is not clogged.   If your port needs to remain accessed for a constant infusion, a clear (transparent) bandage will be placed over the needle site. The bandage and needle will need to be changed every week, or as directed by your health care provider.   Keep the bandage covering the needle clean and dry. Do not get it wet. Follow your health care provider's instructions on how to take a shower or bath while the port is accessed.   If your port does not need to stay accessed, no bandage is needed over the port.  WHAT IS FLUSHING? Flushing helps keep the port from getting clogged. Follow your health care provider's instructions on how and when to flush the port. Ports are usually flushed with saline solution or a medicine called heparin. The need for flushing will depend on how the port is used.   If the port is used for intermittent medicines or blood draws, the port will need to be flushed:   After medicines have been given.   After blood has been drawn.   As part of routine maintenance.   If a constant infusion is running, the port may not need to be flushed.  HOW LONG WILL MY PORT STAY IMPLANTED? The port can stay in for as long as your health care provider thinks it is needed. When it is time for the port to come out, surgery will be done to remove it. The procedure is similar to the one performed when the port was put in.  WHEN SHOULD I SEEK IMMEDIATE MEDICAL CARE? When you have an implanted port, you should seek immediate medical care if:   You notice a bad smell coming from the incision site.   You have swelling, redness, or drainage at the incision site.   You have more swelling or pain at the port site or the surrounding area.   You have a fever that is not controlled with medicine.   This information is not  intended to replace advice given to you by your health care provider. Make sure you discuss any questions you have with your health care provider.   Document Released: 02/11/2005 Document Revised: 12/02/2012 Document Reviewed: 10/19/2012 Elsevier Interactive Patient Education [employer].

## 2015-01-04 ENCOUNTER — Encounter: Payer: Self-pay | Admitting: Internal Medicine

## 2015-01-04 ENCOUNTER — Ambulatory Visit (HOSPITAL_BASED_OUTPATIENT_CLINIC_OR_DEPARTMENT_OTHER): Payer: Medicare Other | Admitting: Internal Medicine

## 2015-01-04 ENCOUNTER — Encounter: Payer: Self-pay | Admitting: *Deleted

## 2015-01-04 ENCOUNTER — Telehealth: Payer: Self-pay | Admitting: Internal Medicine

## 2015-01-04 VITALS — BP 110/65 | HR 66 | Temp 97.6°F | Resp 17 | Ht 66.0 in | Wt 183.1 lb

## 2015-01-04 DIAGNOSIS — J209 Acute bronchitis, unspecified: Secondary | ICD-10-CM | POA: Diagnosis not present

## 2015-01-04 DIAGNOSIS — Z85118 Personal history of other malignant neoplasm of bronchus and lung: Secondary | ICD-10-CM

## 2015-01-04 DIAGNOSIS — C3411 Malignant neoplasm of upper lobe, right bronchus or lung: Secondary | ICD-10-CM

## 2015-01-04 MED ORDER — AZITHROMYCIN 250 MG PO TABS: ORAL_TABLET | ORAL | Status: DC

## 2015-01-04 NOTE — Telephone Encounter (Signed)
per pof to sch pt appt-gave pt copy of avs-adv central sch will call to sch trmt °

## 2015-01-04 NOTE — Progress Notes (Signed)
[place] [phone]   [phone]  OFFICE PROGRESS NOTE  [name], MD [phone] [address], [address] [place] [address]  DIAGNOSIS: Stage IIIA (T1b., N2, M0) non-small cell lung cancer, squamous cell carcinoma presented with right upper lobe lung nodule in addition to mediastinal lymphadenopathy diagnosed in November of 2014.  PRIOR THERAPY:  1) Concurrent chemoradiation with weekly carboplatin for AUC of 2 and paclitaxel 45 mg/M2, status post 8 cycles, last dose was given 03/15/2013 with partial response. 2) Consolidation chemotherapy with carboplatin for AUC of 5 and paclitaxel 175 mg/M2 every 3 weeks with Neulasta support, status post 3 cycles. First dose 05/03/2013. Last dose was given 06/14/2013.  CURRENT THERAPY: Observation.  CHEMOTHERAPY INTENT: Control/curative  CURRENT # OF CHEMOTHERAPY CYCLES: 0  CURRENT ANTIEMETICS: Zofran, dexamethasone and Compazine  CURRENT SMOKING STATUS: Former smoker.  ORAL CHEMOTHERAPY AND CONSENT: None  CURRENT BISPHOSPHONATES USE: None  PAIN MANAGEMENT: 0/10  NARCOTICS INDUCED CONSTIPATION: None  LIVING WILL AND CODE STATUS: Full code   INTERVAL HISTORY: Dawn Mercer 70 y.o. female returns to the clinic today for followup visit accompanied by her husband. She has been on observation for the last 16 months with no significant complaints. The patient has flulike symptoms that started last week but no significant fever or chills. She denied having any significant chest pain, shortness of breath, but has mild cough cough with no hemoptysis. She denied having any nausea or vomiting. She has no significant weight loss or night sweats. She had repeat CT scan of the chest performed recently and she is here for evaluation and discussion of her scan results.  MEDICAL HISTORY: Past Medical History  Diagnosis Date  . 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 Status post  radiation therapy 10/1999  . GERD (gastroesophageal reflux disease)   . Hypothyroidism   . IBS (irritable bowel syndrome)     Hx: of  . COPD (chronic obstructive pulmonary disease) ([place])   . Shortness of breath     Hx: of with exertion  . Bronchitis     Hx: of  . Hay fever     Hx: of  . Seizures ([place])     Hx: of over 40 years ago  . H/O hiatal hernia   . History of radiation therapy 01/28/13-03/23/13    63 gray to right upper lobe  . 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 Lung cancer, upper lobe ([place]) dx'd 12/2012    chemo and XRt complete  . Anxiety     ALLERGIES:  is allergic to latex and codeine.  MEDICATIONS:  Current Outpatient Prescriptions  Medication Sig Dispense Refill  . albuterol (PROVENTIL HFA;VENTOLIN HFA) 108 (90 BASE) MCG/ACT inhaler Inhale 1 puff into the lungs every 6 (six) hours as needed for wheezing.    Marland Kitchen ALPRAZolam (XANAX) 0.5 MG tablet Take 0.5 tablets (0.25 mg total) by mouth 2 (two) times daily as needed for anxiety. 90 tablet 0  . Cholecalciferol (VITAMIN D-3) 5000 UNITS TABS Take 5,000 Units by mouth 2 (two) times a week.    . DEXILANT 60 MG capsule     . gabapentin (NEURONTIN) 100 MG capsule Take 1 capsule (100 mg total) by mouth 3 (three) times daily. 90 capsule 1  . HYDROcodone-acetaminophen (NORCO) 5-325 MG per tablet Take 1 tablet by mouth every 6 (six) hours as needed. 150 tablet 0  . levothyroxine (SYNTHROID, LEVOTHROID) 112 MCG  tablet Take 112 mcg by mouth daily before breakfast.    . Multiple Vitamins-Minerals (MULTIVITAMIN ADULTS 50+ PO) Take by mouth.    . esomeprazole (NEXIUM) 40 MG capsule Take 40 mg by mouth daily before breakfast.    . omeprazole (PRILOSEC) 40 MG capsule Take 40 mg by mouth daily.     No current facility-administered medications for this visit.    SURGICAL HISTORY:  Past Surgical History  Procedure Laterality Date  . Laser ablation  04/2004    for VIN III  . Colonoscopy w/ biopsies and  polypectomy      Hx: of  . Tubal ligation    . Video bronchoscopy with endobronchial navigation N/A 12/31/2012    Procedure: VIDEO BRONCHOSCOPY WITH ENDOBRONCHIAL NAVIGATION;  Surgeon: Melrose Nakayama, MD;  Location: [facility];  Service: Thoracic;  Laterality: N/A;  . Video bronchoscopy with endobronchial ultrasound N/A 12/31/2012    Procedure: VIDEO BRONCHOSCOPY WITH ENDOBRONCHIAL ULTRASOUND;  Surgeon: Melrose Nakayama, MD;  Location: [facility];  Service: Thoracic;  Laterality: N/A;  . Cholecystectomy N/A 08/03/2013    Procedure: LAPAROSCOPIC CHOLECYSTECTOMY;  Surgeon: Harl Bowie, MD;  Location: [facility];  Service: General;  Laterality: N/A;    REVIEW OF SYSTEMS:  A comprehensive review of systems was negative except for: Respiratory: positive for cough   PHYSICAL EXAMINATION: General appearance: alert, cooperative and no distress Head: Normocephalic, without obvious abnormality, atraumatic Neck: no adenopathy, no JVD, supple, symmetrical, trachea midline and thyroid not enlarged, symmetric, no tenderness/mass/nodules Lymph nodes: Cervical, supraclavicular, and axillary nodes normal. Resp: clear to auscultation bilaterally Back: symmetric, no curvature. ROM normal. No CVA tenderness. Cardio: regular rate and rhythm, S1, S2 normal, no murmur, click, rub or gallop GI: soft, non-tender; bowel sounds normal; no masses,  no organomegaly Extremities: extremities normal, atraumatic, no cyanosis or edema Neurologic: Alert and oriented X 3, normal strength and tone. Normal symmetric reflexes. Normal coordination and gait  ECOG PERFORMANCE STATUS: 1 - Symptomatic but completely ambulatory  Blood pressure 110/65, pulse 66, temperature 97.6 F (36.4 C), temperature source Oral, resp. rate 17, height '5\' 6"'$  (1.676 m), weight 183 lb 1.6 oz (83.054 kg), SpO2 94 %.  LABORATORY DATA: Lab Results  Component Value Date   WBC 7.0 12/28/2014   HGB 13.9 12/28/2014   HCT 42.5 12/28/2014   MCV 87.7  12/28/2014   PLT 223 12/28/2014      Chemistry      Component Value Date/Time   NA 139 12/28/2014 1009   NA 139 10/04/2013 1051   K 4.3 12/28/2014 1009   K 4.5 10/04/2013 1051   CL 101 10/04/2013 1051   CO2 28 12/28/2014 1009   CO2 26 10/04/2013 1051   BUN 9.4 12/28/2014 1009   BUN 13 10/04/2013 1051   CREATININE 0.8 12/28/2014 1009   CREATININE 0.81 10/04/2013 1051      Component Value Date/Time   CALCIUM 9.5 12/28/2014 1009   CALCIUM 9.4 10/04/2013 1051   ALKPHOS 113 12/28/2014 1009   ALKPHOS 107 10/04/2013 1051   AST 14 12/28/2014 1009   AST 16 10/04/2013 1051   ALT 10 12/28/2014 1009   ALT 9 10/04/2013 1051   BILITOT 0.75 12/28/2014 1009   BILITOT 0.6 10/04/2013 1051       RADIOGRAPHIC STUDIES: Ct Chest W Contrast  12/28/2014  CLINICAL DATA:  Restaging lung cancer. EXAM: CT CHEST WITH CONTRAST TECHNIQUE: Multidetector CT imaging of the chest was performed during intravenous contrast administration. CONTRAST:  72m OMNIPAQUE IOHEXOL  300 MG/ML  SOLN COMPARISON:  Multiple prior chest CTs. The most recent is 08/22/2014. FINDINGS: Mediastinum/Nodes: No breast masses, supraclavicular or axillary lymphadenopathy. A right-sided Port-A-Cath is stable. The heart is normal in size. No pericardial effusion. Stable prominent pericardial and epicardial fat. Stable coronary artery calcifications and advanced aortic calcifications. No dissection or focal aneurysm. Slight interval enlargement of right suprahilar and infrahilar lymph nodes. 8 mm node on image number 22 measured approximately 6.5 mm on the previous CT. 10 mm short axis subcarinal node on image 26 previously measured 8 mm. 10.5 mm Right infrahilar lymph node on image number 29 previously measured 5 mm. 7 mm infrahilar lymph node on image 30 previously measured 6 mm. 9.5 infrahilar lymph node on the left on image number 30 previously measured 6.5 mm. Lungs/Pleura: Stable severe/advanced emphysematous changes with pulmonary  scarring. The right upper lobe pulmonary nodule is stable at 9 mm. Adjacent pleural thickening and tenting is unchanged. No new/ acute pulmonary process. No findings to suggest pulmonary metastatic disease. No pleural effusion. Upper abdomen: Stable diffuse fatty infiltration of the liver but no focal hepatic lesions. Stable low-attenuation left adrenal gland lesion likely benign adenoma. Musculoskeletal: No significant osseous findings. IMPRESSION: 1. Stable right upper lobe pulmonary lesion and adjacent pleural thickening. 2. Stable advanced emphysematous changes and pulmonary scarring. No findings for pulmonary metastatic disease. 3. Interval slight enlargement of hilar and mediastinal lymph nodes as detailed above. PET-CT may be helpful to exclude recurrent tumor. 4. Stable left adrenal gland adenoma. Electronically Signed   By: Marijo Sanes M.D.   On: 12/28/2014 12:44   ASSESSMENT AND PLAN: This is a very pleasant 69 years old white female recently diagnosed with a stage IIIA non-small cell lung cancer currently undergoing concurrent chemoradiation with weekly carboplatin and paclitaxel status post 8 weekly treatment followed by consolidation chemotherapy with carboplatin and paclitaxel status post 3 cycles.  She has been on observation for the last 16 months and feeling very well. The recent CT scan of the chest showed no evidence for disease progression except for a slightly enlarging hilar and mediastinal lymph nodes. I discussed the scan results with the patient today. I will see her back for follow-up visit in 3 months with repeat CT scan of the chest for further evaluation of the mediastinal lymphadenopathy. She will continue to have Port-A-Cath flush every 2 months.  For the flu symptoms and acute bronchitis, I will start the patient on Z Pak. She was advised to call immediately if she has any concerning symptoms in the interval. The patient voices understanding of current disease status and  treatment options and is in agreement with the current care plan.  All questions were answered. The patient knows to call the clinic with any problems, questions or concerns. We can certainly see the patient much sooner if necessary.  Disclaimer: This note was dictated with voice recognition software. Similar sounding words can inadvertently be transcribed and may not be corrected upon review.

## 2015-01-04 NOTE — Progress Notes (Signed)
Oncology Nurse Navigator Documentation  Oncology Nurse Navigator Flowsheets 01/04/2015  Navigator Encounter Type Clinic/MDC/spoke with patient and husband today at [facility].  She is doing well.  She stated her last scan showed some increase in size of lung mass.  She is going to have further studies.  I listened as she explained.  She stated she may have to have more chemo but she was ok with that.  I asked that she call me if needed.   Patient Visit Type Follow-up  Treatment Phase Other/Observation   Barriers/Navigation Needs Education  Education Other  Support Groups/Services -  Time Spent with Patient 30

## 2015-01-05 ENCOUNTER — Telehealth: Payer: Self-pay

## 2015-01-05 NOTE — Telephone Encounter (Signed)
Last CT showed a little slight enlargement of lymph node. Pt was asking if this is from the prior tumor or what. Explained it is not definitive if it is reccurence or another cause for the lymphadenopathy. Dr Julien Nordmann is choosing to take a wait and watch approach. Encourage pt not to fret but to call us if there are any changes. Also to keep her appts in February.

## 2015-02-15 ENCOUNTER — Telehealth: Payer: Self-pay | Admitting: *Deleted

## 2015-02-15 NOTE — Telephone Encounter (Signed)
Oncology Nurse Navigator Documentation  Oncology Nurse Navigator Flowsheets 02/15/2015  Navigator Encounter Type Telephone/Patient called.  She stated she was having left rib pain.  I updated Dr. Julien Nordmann.  He stated she can come to see Jenny Reichmann NP for symptom.  I called and spoke with her.  She stated she would rather go see PCP instead. She will see him tomorrow.  I asked that she follow up with me if needed.  She stated she would.    Patient Visit Type Follow-up  Treatment Phase Other  Interventions Coordination of Care  Time Spent with Patient 30

## 2015-02-15 NOTE — Telephone Encounter (Signed)
Patient called requesting she speak with Lung navigator.  Call transferred to [phone].  Received voicemail.

## 2015-03-01 ENCOUNTER — Ambulatory Visit (HOSPITAL_BASED_OUTPATIENT_CLINIC_OR_DEPARTMENT_OTHER): Payer: Medicare Other

## 2015-03-01 DIAGNOSIS — Z452 Encounter for adjustment and management of vascular access device: Secondary | ICD-10-CM | POA: Diagnosis not present

## 2015-03-01 DIAGNOSIS — Z95828 Presence of other vascular implants and grafts: Secondary | ICD-10-CM

## 2015-03-01 DIAGNOSIS — Z85118 Personal history of other malignant neoplasm of bronchus and lung: Secondary | ICD-10-CM | POA: Diagnosis not present

## 2015-03-01 MED ORDER — SODIUM CHLORIDE 0.9 % IJ SOLN
10.0000 mL | INTRAMUSCULAR | Status: DC | PRN
  Administered 2015-03-01: 10 mL via INTRAVENOUS
  Filled 2015-03-01: qty 10

## 2015-03-01 MED ORDER — HEPARIN SOD (PORK) LOCK FLUSH 100 UNIT/ML IV SOLN
500.0000 [IU] | Freq: Once | INTRAVENOUS | Status: AC
  Administered 2015-03-01: 500 [IU] via INTRAVENOUS
  Filled 2015-03-01: qty 5

## 2015-03-01 NOTE — Patient Instructions (Signed)

## 2015-03-16 ENCOUNTER — Encounter: Payer: Self-pay | Admitting: Radiation Oncology

## 2015-03-16 ENCOUNTER — Ambulatory Visit
Admission: RE | Admit: 2015-03-16 | Discharge: 2015-03-16 | Disposition: A | Discharge status: Home / Self Care | Payer: Medicare Other | Source: Ambulatory Visit | Attending: Radiation Oncology | Admitting: Radiation Oncology

## 2015-03-16 VITALS — BP 111/70 | HR 76 | Temp 98.3°F | Resp 24 | Ht 66.0 in | Wt 184.6 lb

## 2015-03-16 DIAGNOSIS — C3411 Malignant neoplasm of upper lobe, right bronchus or lung: Secondary | ICD-10-CM

## 2015-03-16 MED ORDER — ALPRAZOLAM 0.5 MG PO TABS: 0.2500 mg | ORAL_TABLET | Freq: Two times a day (BID) | ORAL | Status: DC | PRN

## 2015-03-16 MED ORDER — HYDROCODONE-ACETAMINOPHEN 5-325 MG PO TABS: 1.0000 | ORAL_TABLET | Freq: Four times a day (QID) | ORAL | Status: DC | PRN

## 2015-03-16 NOTE — Progress Notes (Signed)
Radiation Oncology         [phone]) [phone] ________________________________  Name: Dawn Mercer MRN: [id number]  Date: 03/16/2015  DOB: 22-Dec-1945    Follow-Up Visit Note  CC: [name], MD  Antony Blackbird, MD  Diagnosis: Stage IIIA (T1b., N2, M0) non-small cell lung cancer, squamous cell carcinoma presenting with right upper lobe lung nodule  Interval Since Last Radiation:  2 years.  January 28, 2013 through March 23, 2013: Right upper lobe primary site and mediastinal area, 63 gray in 35 fractions  10/1999: Early stage cervical cancer. She underwent definitive treatment with radiation as well as intracavitary brachytherapy treatment using cesium under my direction at [place]. She was followed for several years with no recurrence.  Narrative:  The patient returns today for routine follow-up. CT scan of the chest on 12/28/14 showed a stable right upper lobe pulmonary lesion and adjacent pleural thickening, stable emphysematous changes and pulmonary scarring, a stable left adrenal gland adenoma, and interval slight enlargement of hilar and mediastinal lymph nodes. She denies pain. She does report falling 2 weeks ago and said she bruised her left side. She reports shortness of breath with acitivty. Her oxygen saturation on arrival was 75%. With deep breathing her O2 saturation went up to 91%. Denies chest pain. She reports that she has a cold and will start taking a z-pack today. She has a productive coughing with green sputum. She denies difficulty swallowing, a sore throat, or hemoptysis. She is requested a refill on hydrocodone and xanax. She takes the hydrocodone for hip pain. She reports fatigue. Dr. Ephriam Jenkins will be her PCP from now on since her current PCP will be retiring. Her pharmacy is the [facility] at [address], [place], [place] [address], phone number [phone].  ALLERGIES:  is allergic to latex and codeine.  Meds: Current Outpatient Prescriptions  Medication  Sig Dispense Refill  . albuterol (PROVENTIL HFA;VENTOLIN HFA) 108 (90 BASE) MCG/ACT inhaler Inhale 1 puff into the lungs every 6 (six) hours as needed for wheezing.    Marland Kitchen ALPRAZolam (XANAX) 0.5 MG tablet Take 0.5 tablets (0.25 mg total) by mouth 2 (two) times daily as needed for anxiety. 90 tablet 0  . Cholecalciferol (VITAMIN D-3) 5000 UNITS TABS Take 5,000 Units by mouth 2 (two) times a week.    . DEXILANT 60 MG capsule     . gabapentin (NEURONTIN) 100 MG capsule Take 1 capsule (100 mg total) by mouth 3 (three) times daily. 90 capsule 1  . HYDROcodone-acetaminophen (NORCO) 5-325 MG tablet Take 1 tablet by mouth every 6 (six) hours as needed. 150 tablet 0  . levothyroxine (SYNTHROID, LEVOTHROID) 112 MCG tablet Take 112 mcg by mouth daily before breakfast.    . Multiple Vitamins-Minerals (MULTIVITAMIN ADULTS 50+ PO) Take by mouth.    Marland Kitchen azithromycin (ZITHROMAX Z-PAK) 250 MG tablet Use as instructed (Patient not taking: Reported on 03/16/2015) 6 each 0   No current facility-administered medications for this encounter.    Physical Findings: The patient is in no acute distress. Patient is alert and oriented.  height is '5\' 6"'$  (1.676 m) and weight is 184 lb 9.6 oz (83.734 kg). Her oral temperature is 98.3 F (36.8 C). Her blood pressure is 111/70 and her pulse is 76. Her respiration is 24 and oxygen saturation is 91%. .  No palpable supraclavicular or axillary adenopathy. The lungs are clear to auscultation. The heart has regular rhythm and rate.  Lab Findings: Lab Results  Component Value Date  WBC 7.0 12/28/2014   HGB 13.9 12/28/2014   HCT 42.5 12/28/2014   MCV 87.7 12/28/2014   PLT 223 12/28/2014    Radiographic Findings: No results found.  Impression:  No evidence recurrence on recent CT scan and clinical exam today.   Plan: I have refilled her hydrocodone and xanax. She will see Dr. Julien Nordmann next month for blood work and another chest CT scan. She will follow up with me in 4  months.  -----------------------------------  Blair Promise, PhD, MD  This document serves as a record of services personally performed by Gery Pray, MD. It was created on his behalf by Darcus Austin, a trained medical scribe. The creation of this record is based on the scribe's personal observations and the provider's statements to them. This document has been checked and approved by the attending provider.

## 2015-03-16 NOTE — Progress Notes (Signed)
Dawn Mercer here for follow up.  She denies pain.  She does report falling 2 weeks ago and said she bruised her left side.  She reports having shortness of breath with acitivty.  Her oxygen saturation on arrival was 75%.  With deep breathing her sat went up to 91%.  She reports that she has a cold and will start taking a z pack today.  She has been coughing with green sputum.  She denies trouble swallowing or a sore throat.  She is requested a refill on hydrocodone and xanax.  She reports having fatigue.  BP 111/70 mmHg  Pulse 76  Temp(Src) 98.3 F (36.8 C) (Oral)  Resp 24  Ht '5\' 6"'$  (1.676 m)  Wt 184 lb 9.6 oz (83.734 kg)  BMI 29.81 kg/m2  SpO2 91%

## 2015-04-05 ENCOUNTER — Ambulatory Visit (HOSPITAL_BASED_OUTPATIENT_CLINIC_OR_DEPARTMENT_OTHER): Payer: Medicare Other | Admitting: Lab

## 2015-04-05 ENCOUNTER — Ambulatory Visit (HOSPITAL_BASED_OUTPATIENT_CLINIC_OR_DEPARTMENT_OTHER): Payer: Medicare Other

## 2015-04-05 ENCOUNTER — Other Ambulatory Visit: Payer: Medicare Other

## 2015-04-05 ENCOUNTER — Ambulatory Visit (HOSPITAL_COMMUNITY)
Admission: RE | Admit: 2015-04-05 | Discharge: 2015-04-05 | Disposition: A | Discharge status: Home / Self Care | Payer: Medicare Other | Source: Ambulatory Visit | Attending: Internal Medicine | Admitting: Internal Medicine

## 2015-04-05 ENCOUNTER — Other Ambulatory Visit: Payer: Self-pay

## 2015-04-05 ENCOUNTER — Encounter (HOSPITAL_COMMUNITY): Payer: Self-pay

## 2015-04-05 DIAGNOSIS — C3411 Malignant neoplasm of upper lobe, right bronchus or lung: Secondary | ICD-10-CM

## 2015-04-05 DIAGNOSIS — Z452 Encounter for adjustment and management of vascular access device: Secondary | ICD-10-CM | POA: Diagnosis not present

## 2015-04-05 DIAGNOSIS — D3502 Benign neoplasm of left adrenal gland: Secondary | ICD-10-CM | POA: Diagnosis not present

## 2015-04-05 DIAGNOSIS — Z95828 Presence of other vascular implants and grafts: Secondary | ICD-10-CM

## 2015-04-05 LAB — CBC WITH DIFFERENTIAL/PLATELET
BASO%: 0.8 % (ref 0.0–2.0)
Basophils Absolute: 0.1 10*3/uL (ref 0.0–0.1)
EOS ABS: 0.2 10*3/uL (ref 0.0–0.5)
EOS%: 2.4 % (ref 0.0–7.0)
HCT: 42.9 % (ref 34.8–46.6)
HGB: 13.8 g/dL (ref 11.6–15.9)
LYMPH%: 22.7 % (ref 14.0–49.7)
MCH: 27.9 pg (ref 25.1–34.0)
MCHC: 32.2 g/dL (ref 31.5–36.0)
MCV: 86.6 fL (ref 79.5–101.0)
MONO#: 0.6 10*3/uL (ref 0.1–0.9)
MONO%: 9 % (ref 0.0–14.0)
NEUT%: 65.1 % (ref 38.4–76.8)
NEUTROS ABS: 4.4 10*3/uL (ref 1.5–6.5)
Platelets: 198 10*3/uL (ref 145–400)
RBC: 4.95 10*6/uL (ref 3.70–5.45)
RDW: 16.6 % — AB (ref 11.2–14.5)
WBC: 6.7 10*3/uL (ref 3.9–10.3)
lymph#: 1.5 10*3/uL (ref 0.9–3.3)

## 2015-04-05 LAB — COMPREHENSIVE METABOLIC PANEL
ALBUMIN: 3.4 g/dL — AB (ref 3.5–5.0)
ALT: 11 U/L (ref 0–55)
ANION GAP: 10 meq/L (ref 3–11)
AST: 15 U/L (ref 5–34)
Alkaline Phosphatase: 111 U/L (ref 40–150)
BILIRUBIN TOTAL: 0.97 mg/dL (ref 0.20–1.20)
BUN: 11.9 mg/dL (ref 7.0–26.0)
CALCIUM: 9.3 mg/dL (ref 8.4–10.4)
CHLORIDE: 104 meq/L (ref 98–109)
CO2: 25 mEq/L (ref 22–29)
CREATININE: 0.8 mg/dL (ref 0.6–1.1)
EGFR: 78 mL/min/{1.73_m2} — ABNORMAL LOW (ref >90)
Glucose: 91 mg/dl (ref 70–140)
Potassium: 4.2 mEq/L (ref 3.5–5.1)
Sodium: 139 mEq/L (ref 136–145)
TOTAL PROTEIN: 7.4 g/dL (ref 6.4–8.3)

## 2015-04-05 MED ORDER — IOHEXOL 300 MG/ML  SOLN
75.0000 mL | Freq: Once | INTRAMUSCULAR | Status: AC | PRN
  Administered 2015-04-05: 75 mL via INTRAVENOUS

## 2015-04-05 MED ORDER — SODIUM CHLORIDE 0.9% FLUSH
10.0000 mL | INTRAVENOUS | Status: DC | PRN
  Administered 2015-04-05: 10 mL via INTRAVENOUS
  Filled 2015-04-05: qty 10

## 2015-04-05 NOTE — Patient Instructions (Signed)

## 2015-04-12 ENCOUNTER — Encounter: Payer: Self-pay | Admitting: *Deleted

## 2015-04-12 ENCOUNTER — Telehealth: Payer: Self-pay | Admitting: Internal Medicine

## 2015-04-12 ENCOUNTER — Encounter: Payer: Self-pay | Admitting: Internal Medicine

## 2015-04-12 ENCOUNTER — Ambulatory Visit (HOSPITAL_BASED_OUTPATIENT_CLINIC_OR_DEPARTMENT_OTHER): Payer: Medicare Other | Admitting: Internal Medicine

## 2015-04-12 VITALS — BP 118/57 | HR 106 | Temp 97.9°F | Resp 18 | Ht 66.0 in | Wt 187.7 lb

## 2015-04-12 DIAGNOSIS — C3411 Malignant neoplasm of upper lobe, right bronchus or lung: Secondary | ICD-10-CM

## 2015-04-12 DIAGNOSIS — G620 Drug-induced polyneuropathy: Secondary | ICD-10-CM

## 2015-04-12 DIAGNOSIS — T451X5A Adverse effect of antineoplastic and immunosuppressive drugs, initial encounter: Principal | ICD-10-CM

## 2015-04-12 DIAGNOSIS — Z85118 Personal history of other malignant neoplasm of bronchus and lung: Secondary | ICD-10-CM

## 2015-04-12 MED ORDER — GABAPENTIN 100 MG PO CAPS: 100.0000 mg | ORAL_CAPSULE | Freq: Three times a day (TID) | ORAL | Status: DC

## 2015-04-12 NOTE — Telephone Encounter (Signed)
Pt confirmed labs/ov/flush per 02/15 POF, gave pt AVS and Calendar.... KJ

## 2015-04-12 NOTE — Addendum Note (Signed)
Addended by: Ardeen Garland on: 04/12/2015 01:21 PM   Modules accepted: Orders, Medications

## 2015-04-12 NOTE — Progress Notes (Signed)
[facility] [phone]   [phone]  OFFICE PROGRESS NOTE  [name], MD [id number] [address]. [address], [address] [place] [address]  DIAGNOSIS: Stage IIIA (T1b., N2, M0) non-small cell lung cancer, squamous cell carcinoma presented with right upper lobe lung nodule in addition to mediastinal lymphadenopathy diagnosed in November of 2014.  PRIOR THERAPY:  1) Concurrent chemoradiation with weekly carboplatin for AUC of 2 and paclitaxel 45 mg/M2, status post 8 cycles, last dose was given 03/15/2013 with partial response. 2) Consolidation chemotherapy with carboplatin for AUC of 5 and paclitaxel 175 mg/M2 every 3 weeks with Neulasta support, status post 3 cycles. First dose 05/03/2013. Last dose was given 06/14/2013.  CURRENT THERAPY: Observation.  CHEMOTHERAPY INTENT: Control/curative  CURRENT # OF CHEMOTHERAPY CYCLES: 0  CURRENT ANTIEMETICS: Zofran, dexamethasone and Compazine  CURRENT SMOKING STATUS: Former smoker.  ORAL CHEMOTHERAPY AND CONSENT: None  CURRENT BISPHOSPHONATES USE: None  PAIN MANAGEMENT: 0/10  NARCOTICS INDUCED CONSTIPATION: None  LIVING WILL AND CODE STATUS: Full code   INTERVAL HISTORY: Dawn Mercer 70 y.o. female returns to the clinic today for followup visit accompanied by her husband. She has been on observation for almost 23 months with no significant complaints. She denied having any significant chest pain, shortness of breath, cough or hemoptysis. She denied having any nausea or vomiting. She has no fever or chills. She has no significant weight loss or night sweats. She had repeat CT scan of the chest performed recently and she is here for evaluation and discussion of her scan results.  MEDICAL HISTORY: Past Medical History  Diagnosis Date  . 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 Status post radiation therapy 10/1999  . GERD (gastroesophageal reflux disease)   . Hypothyroidism   . IBS  (irritable bowel syndrome)     Hx: of  . COPD (chronic obstructive pulmonary disease) ([facility])   . Shortness of breath     Hx: of with exertion  . Bronchitis     Hx: of  . Hay fever     Hx: of  . Seizures ([place])     Hx: of over 40 years ago  . H/O hiatal hernia   . History of radiation therapy 01/28/13-03/23/13    63 gray to right upper lobe  . 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 Lung cancer, upper lobe ([place]) dx'd 12/2012    chemo and XRt complete  . Anxiety     ALLERGIES:  is allergic to latex and codeine.  MEDICATIONS:  Current Outpatient Prescriptions  Medication Sig Dispense Refill  . albuterol (PROVENTIL HFA;VENTOLIN HFA) 108 (90 BASE) MCG/ACT inhaler Inhale 1 puff into the lungs every 6 (six) hours as needed for wheezing.    Marland Kitchen ALPRAZolam (XANAX) 0.5 MG tablet Take 0.5 tablets (0.25 mg total) by mouth 2 (two) times daily as needed for anxiety. 90 tablet 0  . azithromycin (ZITHROMAX Z-PAK) 250 MG tablet Use as instructed (Patient not taking: Reported on 03/16/2015) 6 each 0  . Cholecalciferol (VITAMIN D-3) 5000 UNITS TABS Take 5,000 Units by mouth 2 (two) times a week.    . DEXILANT 60 MG capsule     . gabapentin (NEURONTIN) 100 MG capsule Take 1 capsule (100 mg total) by mouth 3 (three) times daily. 90 capsule 1  . HYDROcodone-acetaminophen (NORCO) 5-325 MG tablet Take 1 tablet by mouth every 6 (six) hours as needed. 150 tablet 0  . levothyroxine (  SYNTHROID, LEVOTHROID) 112 MCG tablet Take 112 mcg by mouth daily before breakfast.    . Multiple Vitamins-Minerals (MULTIVITAMIN ADULTS 50+ PO) Take by mouth.     No current facility-administered medications for this visit.    SURGICAL HISTORY:  Past Surgical History  Procedure Laterality Date  . Laser ablation  04/2004    for VIN III  . Colonoscopy w/ biopsies and polypectomy      Hx: of  . Tubal ligation    . Video bronchoscopy with endobronchial navigation N/A 12/31/2012    Procedure: VIDEO  BRONCHOSCOPY WITH ENDOBRONCHIAL NAVIGATION;  Surgeon: Melrose Nakayama, MD;  Location: [facility];  Service: Thoracic;  Laterality: N/A;  . Video bronchoscopy with endobronchial ultrasound N/A 12/31/2012    Procedure: VIDEO BRONCHOSCOPY WITH ENDOBRONCHIAL ULTRASOUND;  Surgeon: Melrose Nakayama, MD;  Location: [facility];  Service: Thoracic;  Laterality: N/A;  . Cholecystectomy N/A 08/03/2013    Procedure: LAPAROSCOPIC CHOLECYSTECTOMY;  Surgeon: Harl Bowie, MD;  Location: [facility];  Service: General;  Laterality: N/A;    REVIEW OF SYSTEMS:  A comprehensive review of systems was negative.   PHYSICAL EXAMINATION: General appearance: alert, cooperative and no distress Head: Normocephalic, without obvious abnormality, atraumatic Neck: no adenopathy, no JVD, supple, symmetrical, trachea midline and thyroid not enlarged, symmetric, no tenderness/mass/nodules Lymph nodes: Cervical, supraclavicular, and axillary nodes normal. Resp: clear to auscultation bilaterally Back: symmetric, no curvature. ROM normal. No CVA tenderness. Cardio: regular rate and rhythm, S1, S2 normal, no murmur, click, rub or gallop GI: soft, non-tender; bowel sounds normal; no masses,  no organomegaly Extremities: extremities normal, atraumatic, no cyanosis or edema Neurologic: Alert and oriented X 3, normal strength and tone. Normal symmetric reflexes. Normal coordination and gait  ECOG PERFORMANCE STATUS: 1 - Symptomatic but completely ambulatory  Blood pressure 118/57, pulse 106, temperature 97.9 F (36.6 C), temperature source Oral, resp. rate 18, height '5\' 6"'$  (1.676 m), weight 187 lb 11.2 oz (85.14 kg), SpO2 87 %.  LABORATORY DATA: Lab Results  Component Value Date   WBC 6.7 04/05/2015   HGB 13.8 04/05/2015   HCT 42.9 04/05/2015   MCV 86.6 04/05/2015   PLT 198 04/05/2015      Chemistry      Component Value Date/Time   NA 139 04/05/2015 0935   NA 139 10/04/2013 1051   K 4.2 04/05/2015 0935   K 4.5 10/04/2013  1051   CL 101 10/04/2013 1051   CO2 25 04/05/2015 0935   CO2 26 10/04/2013 1051   BUN 11.9 04/05/2015 0935   BUN 13 10/04/2013 1051   CREATININE 0.8 04/05/2015 0935   CREATININE 0.81 10/04/2013 1051      Component Value Date/Time   CALCIUM 9.3 04/05/2015 0935   CALCIUM 9.4 10/04/2013 1051   ALKPHOS 111 04/05/2015 0935   ALKPHOS 107 10/04/2013 1051   AST 15 04/05/2015 0935   AST 16 10/04/2013 1051   ALT 11 04/05/2015 0935   ALT 9 10/04/2013 1051   BILITOT 0.97 04/05/2015 0935   BILITOT 0.6 10/04/2013 1051       RADIOGRAPHIC STUDIES: Ct Chest W Contrast  04/05/2015  CLINICAL DATA:  Lung cancer, chemotherapy and radiation therapy complete. EXAM: CT CHEST WITH CONTRAST TECHNIQUE: Multidetector CT imaging of the chest was performed during intravenous contrast administration. CONTRAST:  38m OMNIPAQUE IOHEXOL 300 MG/ML  SOLN COMPARISON:  12/28/2014 and PET 08/20/2013. FINDINGS: Mediastinum/Nodes: Right IJ Port-A-Cath terminates in the right atrium. No pathologically enlarged mediastinal, hilar or axillary lymph nodes. Coronary  artery calcification. Heart size normal. No pericardial effusion. Lungs/Pleura: Biapical pleural parenchymal scarring. Moderate centrilobular emphysema with bullous components on the left. Right upper lobe nodule measures 7 mm (series 5, image 15), stable when remeasured on the prior exam. Surrounding architectural distortion in the right upper lobe, presumably treatment related. Mosaic attenuation throughout the lungs bilaterally. No pleural fluid. No pleural fluid. Airway is unremarkable. Upper abdomen: 5 mm low-attenuation lesion in the left hepatic lobe is too small to characterize. Cholecystectomy. Right adrenal gland is unremarkable. 1.6 cm nodule in the left adrenal gland is unchanged and was fluid in density on 08/20/2013. Visualized portions of the kidneys, spleen, pancreas unremarkable. Tiny hiatal hernia. No upper abdominal adenopathy. Musculoskeletal: No  worrisome lytic or sclerotic lesions. Old right rib fractures or radiation change in the anterolateral right ribs. IMPRESSION: 1. Right upper lobe nodule is stable. Architectural distortion in the surrounding right lung is presumably treatment related. 2. Left adrenal adenoma. Electronically Signed   By: Lorin Picket M.D.   On: 04/05/2015 13:31   ASSESSMENT AND PLAN: This is a very pleasant 70 years old white female recently diagnosed with a stage IIIA non-small cell lung cancer currently undergoing concurrent chemoradiation with weekly carboplatin and paclitaxel status post 8 weekly treatment followed by consolidation chemotherapy with carboplatin and paclitaxel status post 3 cycles.  She has been on observation for almost 2 years and is feeling very well. The recent CT scan of the chest showed no evidence for disease progression. I will see her back for follow-up visit in 6 months with repeat CT scan of the chest. She will continue to have Port-A-Cath flush every 2 months.  She was advised to call immediately if she has any concerning symptoms in the interval. The patient voices understanding of current disease status and treatment options and is in agreement with the current care plan.  All questions were answered. The patient knows to call the clinic with any problems, questions or concerns. We can certainly see the patient much sooner if necessary.  Disclaimer: This note was dictated with voice recognition software. Similar sounding words can inadvertently be transcribed and may not be corrected upon review.

## 2015-04-12 NOTE — Progress Notes (Signed)
Oncology Nurse Navigator Documentation  Oncology Nurse Navigator Flowsheets 04/12/2015  Navigator Encounter Type Clinic/MDC  Patient Visit Type Follow-up/I spoke with patient and husband today.  They are both doing well. She received news from Dr. Julien Nordmann today that her scan looks good.  They are both so happy about the news.  No barriers identified at this time   Treatment Phase (No Data)  Barriers/Navigation Needs No barriers at this time  Time Spent with Patient 30

## 2015-04-26 ENCOUNTER — Ambulatory Visit (HOSPITAL_BASED_OUTPATIENT_CLINIC_OR_DEPARTMENT_OTHER): Payer: Medicare Other

## 2015-04-26 VITALS — BP 113/58 | HR 92 | Temp 98.6°F | Resp 20

## 2015-04-26 DIAGNOSIS — Z452 Encounter for adjustment and management of vascular access device: Secondary | ICD-10-CM

## 2015-04-26 DIAGNOSIS — C3411 Malignant neoplasm of upper lobe, right bronchus or lung: Secondary | ICD-10-CM

## 2015-04-26 DIAGNOSIS — Z95828 Presence of other vascular implants and grafts: Secondary | ICD-10-CM

## 2015-04-26 MED ORDER — HEPARIN SOD (PORK) LOCK FLUSH 100 UNIT/ML IV SOLN
500.0000 [IU] | Freq: Once | INTRAVENOUS | Status: AC
  Administered 2015-04-26: 500 [IU] via INTRAVENOUS
  Filled 2015-04-26: qty 5

## 2015-04-26 MED ORDER — SODIUM CHLORIDE 0.9% FLUSH
10.0000 mL | INTRAVENOUS | Status: DC | PRN
  Administered 2015-04-26: 10 mL via INTRAVENOUS
  Filled 2015-04-26: qty 10

## 2015-04-26 NOTE — Progress Notes (Signed)
Pt's O2 Sats at 86, after starting at 58, spoke to pt about contacting her pulmonologist or Dr Sheryn Bison for a home evaluation for O2.  Pt verbalized understanding.

## 2015-04-26 NOTE — Patient Instructions (Signed)

## 2015-05-15 ENCOUNTER — Institutional Professional Consult (permissible substitution): Payer: Medicare Other | Admitting: Pulmonary Disease

## 2015-05-17 ENCOUNTER — Ambulatory Visit (HOSPITAL_BASED_OUTPATIENT_CLINIC_OR_DEPARTMENT_OTHER): Payer: Medicare Other

## 2015-05-17 VITALS — BP 107/63 | HR 74 | Temp 97.7°F | Resp 20

## 2015-05-17 DIAGNOSIS — Z85118 Personal history of other malignant neoplasm of bronchus and lung: Secondary | ICD-10-CM

## 2015-05-17 DIAGNOSIS — Z452 Encounter for adjustment and management of vascular access device: Secondary | ICD-10-CM | POA: Diagnosis not present

## 2015-05-17 DIAGNOSIS — Z95828 Presence of other vascular implants and grafts: Secondary | ICD-10-CM

## 2015-05-17 MED ORDER — SODIUM CHLORIDE 0.9% FLUSH
10.0000 mL | INTRAVENOUS | Status: DC | PRN
  Administered 2015-05-17: 10 mL via INTRAVENOUS
  Filled 2015-05-17: qty 10

## 2015-05-17 MED ORDER — HEPARIN SOD (PORK) LOCK FLUSH 100 UNIT/ML IV SOLN
500.0000 [IU] | Freq: Once | INTRAVENOUS | Status: AC
  Administered 2015-05-17: 500 [IU] via INTRAVENOUS
  Filled 2015-05-17: qty 5

## 2015-05-17 NOTE — Patient Instructions (Signed)

## 2015-05-31 ENCOUNTER — Telehealth: Payer: Self-pay | Admitting: Pulmonary Disease

## 2015-05-31 ENCOUNTER — Ambulatory Visit (INDEPENDENT_AMBULATORY_CARE_PROVIDER_SITE_OTHER): Payer: Medicare Other | Admitting: Pulmonary Disease

## 2015-05-31 ENCOUNTER — Encounter: Payer: Self-pay | Admitting: Pulmonary Disease

## 2015-05-31 ENCOUNTER — Ambulatory Visit (INDEPENDENT_AMBULATORY_CARE_PROVIDER_SITE_OTHER)
Admission: RE | Admit: 2015-05-31 | Discharge: 2015-05-31 | Disposition: A | Discharge status: Home / Self Care | Payer: Medicare Other | Source: Ambulatory Visit | Attending: Pulmonary Disease | Admitting: Pulmonary Disease

## 2015-05-31 VITALS — BP 130/78 | HR 69 | Temp 97.8°F | Ht 66.0 in | Wt 188.4 lb

## 2015-05-31 DIAGNOSIS — J449 Chronic obstructive pulmonary disease, unspecified: Secondary | ICD-10-CM | POA: Diagnosis not present

## 2015-05-31 DIAGNOSIS — C3411 Malignant neoplasm of upper lobe, right bronchus or lung: Secondary | ICD-10-CM

## 2015-05-31 DIAGNOSIS — R0600 Dyspnea, unspecified: Secondary | ICD-10-CM

## 2015-05-31 DIAGNOSIS — Z23 Encounter for immunization: Secondary | ICD-10-CM | POA: Diagnosis not present

## 2015-05-31 DIAGNOSIS — J9611 Chronic respiratory failure with hypoxia: Secondary | ICD-10-CM | POA: Diagnosis not present

## 2015-05-31 MED ORDER — LEVALBUTEROL HCL 0.63 MG/3ML IN NEBU
0.6300 mg | INHALATION_SOLUTION | Freq: Once | RESPIRATORY_TRACT | Status: AC
Start: 2015-05-31 — End: 2015-05-31
  Administered 2015-05-31: 0.63 mg via RESPIRATORY_TRACT

## 2015-05-31 MED ORDER — PREDNISONE 20 MG PO TABS: ORAL_TABLET | ORAL | Status: DC

## 2015-05-31 MED ORDER — IPRATROPIUM-ALBUTEROL 0.5-2.5 (3) MG/3ML IN SOLN: 3.0000 mL | Freq: Three times a day (TID) | RESPIRATORY_TRACT | Status: DC

## 2015-05-31 NOTE — Telephone Encounter (Signed)
Yes, rx was faxed & confirmation received. Nothing further needed.

## 2015-05-31 NOTE — Telephone Encounter (Signed)
All prescript for meds have to come through cdr fa# [phone] wants order refaxed per lynn.Hillery Hunter

## 2015-05-31 NOTE — Telephone Encounter (Signed)
Spoke with Jeani Hawking at [facility]  She states needing Duoneb rx faxed to the number listed below  Per Joellen Jersey, rx was given to Sherri to [facility], has this been done?

## 2015-05-31 NOTE — Progress Notes (Signed)
Subjective:     Patient ID: Dawn Mercer, female   DOB: 07/02/1945, 70 y.o.   MRN: [id number]  HPI ~  May 31, 2015:  Initial pulmonary evaluation by SN>   46 y/o WF, referred by Dr.C Melinda Crutch, [place] at [facility], for a pulmonary work-up due to dyspnea & hypoxemia>     Dawn Mercer is an ex-smoker and was diagnosed w/ Squamous cell lung cancer in 2014 after presenting w/ right sided CWP and a CXR showed a right lung mass;  CT showed a 1.7cm RUL mass w/ some hilar & mediastinal adenopathy that were all PET pos;  She had a navigational bronch w/ EBUS bxs of the nodes per [facility] 12/2012 => atypical cells but not suffic for diagnosis; subsequest TTBx by DrYamagata was pos for Squamous cell lung cancer;  DrMohammed has supervised her treatment for this Stage IIIa non-small cell tumor- starting w/ chemoradiation w/ carboplatin & paclitaxel while receiving XRT- 63gray in 35 fractions completed 03/23/13;  This was followed by consolidation chemotherapy for 3 more cycles (last dose 06/14/13); f/u CT Chest showed a partial response;  She was then seen back by the thoracic surgeons to consider poss surgical resection of the RUL;  MRI brain was neg for signs of metastatic dis;  Repeat PET scan showed decr size of the RUL mass and resolution of the metabolic activity in the mediastinal nodes, no new dis;  PFTs showed COPD Stage-3 COPD w/ DLCO reduced at 29%;  They did not recommend proceeding w/ surgical resection...     She has continued to f/u w/ DrMohammed> Last seen 04/12/15>  Note reviewed, she remains on observation x 58yr now, CT Chest 04/05/15 showed norm heart size, right sided port-a-cath, no pathologically enlarged nodes, biapical pleuroparenchymal scarring & mod centrilobular emphysema w/ bullous dis on the left, 751mRUL nodule- stable, left adrenal adenoma- see full report; f/u planned in 66m58mo  Smoking Hx>  She is an ex-smoker having started age 67,19moked for 35 yrs up to 1ppd, quit in 2000 w/ diagnosis  of cervical cancer...  Pulmonary Hx>  She denies hx of signif lung problems- never told to have asthma, rare bronchitic infections even when smoking, never had pneumonia, no known TB or exposure...   Medical Hx>     Hypothyroid, HH/ GERD/ IBS, Anxiety...  She also has a hx of early stage cervical cancer- treated by DrKinard w/ XRT & intracavitary brachytherapy using cesium, no recurrence in continued follow up...  Family Hx>  One brother had small cell lung cancer, no other lung dis in the family...  Occup Hx>  She denies known exposure to asbestos, silica dust, other inorganic or organic dusts, etc...  Current Meds>  O2 at 2-3L/min;  AlbuterolHFA prn;  Dexilant60,  Neurontin100Tid;  Vicodin5 Q6H prn, Synthroid112, Xanax0.5-1/2 Bid prn;  VitD 5000u...   PET scan 12/17/12 showed 2m80mL hypermet nodule, underlying mod centrilob & paraseptal emophysema, hypermet precarinal & right hilar adenopathy, brachytherapy seeds seen in region of cervix, healing fx R 6th Rib  Last CXR 07/30/13>  Port-a-cath on right, underlying COPD/emphysema, RUL nodule difficult to see on CXR...   Prev Full PFTs 08/19/13>  FVC=1.64 (52%), FEV1=1.03 (43%), %1sec=63, mid-flows reduced at 26% predicted; post bronchodil FEV1 improved 3%;  TLC=4.39 (86%);  DLCO=29% and DL/VA=55...  Last CT Chest 04/05/15>  Port-a-cath on right, norm heart size & coronary calcif, no enlarged nodes, biapical pleuro-parenchymal scarring R>L w/ 7mm 366m nodule (stable), mod centrilob emphysema w/ bullae on left,  architectural distortion in RUL, cholecystectomy, left adrenal adenoma...   Last Labs 03/2015>  CBC- wnl;  Chems- ok x Alb=3.4 EXAM shows Afeb, VSS, O2sat=94% on 3LO2; Wt=188#, 5'6"Tall, BMI=30;  HEENT- nasalO2, mallampati1;  Chest- bibasilar crackles, few scat rhonchi, no wheezing;  Heart- RR w/o m/r/g;  Abd- protuberant soft nontender;  Ext- w/o c/c/e.   CXR 05/31/15> port-a-cath on right, borderline heart size, COPD w/ scarring RUL  likely due to XRT, no acute changes on left...  Spirometry 05/31/15>  FVC=1.35 (43%), FEV1=0.88 (36%), %1sec=65, mid-flows reduced at 16% predicted => c/w mod airflow obstruction and GOLD Stage3 COPD...  IMP >>     COPD/ Emphysema>  GOLD Stage 3 COPD/ Emphysema    Hx Stage 3A non-small cell lung cancer (squamous cell ca) Dx 12/2012- treated by DrMohamed & DrKinard w/ chemoradiation followed by consolidation chemotherapy; TSurg declined resection & she has been on observation since 05/2013     Ex cigarette smoker>  She quit smoking in 2000    MEDICAL issues>   Hypothyroid, HH/ GERD/ IBS, hx cervical cancer, Anxiety... Followed by Dawn Mercer.  PLAN >>     Dawn Mercer should benefit from a more aggressive bronchodilator regimen- in this regard she indicated that cost would be a factor so we decided to start Rx w/ NEBULIZER using DUONEB Tid, and PREDNISONE '20mg'$ => '10mg'$  over the next month;  She will also try MUCINEX '600mg'$ - 2Bid, w/ fluids and use her rescue inhaler as needed... We will plan a recheck in 64mo    Past Medical History  Diagnosis Date  . 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 Status post radiation therapy 10/1999  . GERD (gastroesophageal reflux disease)   . Hypothyroidism   . IBS (irritable bowel syndrome)     Hx: of  . COPD (chronic obstructive pulmonary disease) ([place]   . Shortness of breath     Hx: of with exertion  . Bronchitis     Hx: of  . Hay fever     Hx: of  . Seizures ([place]     Hx: of over 40 years ago  . H/O hiatal hernia   . History of radiation therapy 01/28/13-03/23/13    63 gray to right upper lobe  . Cervical cancer ([place] 10/1999    Stage IB adenosquamous carcinoma  . Lung cancer, [place] lobe ([place] dx'd 12/2012    chemo and XRt complete  . Anxiety     Past Surgical History  Procedure Laterality Date  . Laser ablation  04/2004    for VIN III  . Colonoscopy w/ biopsies and polypectomy      Hx: of  . Tubal ligation    . Video bronchoscopy with  endobronchial navigation N/A 12/31/2012    Procedure: VIDEO BRONCHOSCOPY WITH ENDOBRONCHIAL NAVIGATION;  Surgeon: SMelrose Nakayama MD;  [facility]  Service: Thoracic;  Laterality: N/A;  . Video bronchoscopy with endobronchial ultrasound N/A 12/31/2012    Procedure: VIDEO BRONCHOSCOPY WITH ENDOBRONCHIAL ULTRASOUND;  Surgeon: SMelrose Nakayama MD;  [place] [facility]  Service: Thoracic;  Laterality: N/A;  . Cholecystectomy N/A 08/03/2013    Procedure: LAPAROSCOPIC CHOLECYSTECTOMY;  Surgeon: DHarl Bowie MD;  [place]  Service: General;  Laterality: N/A;    Outpatient Encounter Prescriptions as of 05/31/2015  Medication Sig  . albuterol (PROVENTIL HFA;VENTOLIN HFA) 108 (90 BASE) MCG/ACT inhaler Inhale 1 puff into the lungs every 6 (six) hours as needed for wheezing.  .Marland KitchenALPRAZolam (XANAX) 0.5 MG tablet Take 0.5  tablets (0.25 mg total) by mouth 2 (two) times daily as needed for anxiety.  . Cholecalciferol (VITAMIN D-3) 5000 UNITS TABS Take 5,000 Units by mouth 2 (two) times a week.  . DEXILANT 60 MG capsule Take 60 mg by mouth daily as needed.   . gabapentin (NEURONTIN) 100 MG capsule Take 1 capsule (100 mg total) by mouth 3 (three) times daily.  Marland Kitchen HYDROcodone-acetaminophen (NORCO) 5-325 MG tablet Take 1 tablet by mouth every 6 (six) hours as needed.  Marland Kitchen levothyroxine (SYNTHROID, LEVOTHROID) 112 MCG tablet Take 112 mcg by mouth daily before breakfast.  . OXYGEN Inhale into the lungs. 3 lpm with exertion  . [DISCONTINUED] Multiple Vitamins-Minerals (MULTIVITAMIN ADULTS 50+ PO) Take by mouth. Reported on 05/31/2015    Allergies  Allergen Reactions  . Latex Dermatitis  . Codeine Nausea Only    Immunization History  Administered Date(s) Administered  . Influenza,inj,Quad PF,36+ Mos 02/15/2013, 04/25/2014  . Pneumococcal Polysaccharide-23 11/20/1999  She has had the yearly Flu vaccine from PCP... Pt given PREVNAR-13 shot 05/31/15...   Family History  Problem Relation Age of  Onset  . Heart disease Father   . Cervical cancer Sister   . Lung cancer Brother   . Heart disease Brother   . Cancer Brother     small cell lung cancer    Social History   Social History  . Marital Status: Married    Spouse Name: N/A  . Number of Children: N/A  . Years of Education: N/A   Occupational History  . Retired    Social History Main Topics  . Smoking status: Former Smoker -- 1.00 packs/day for 15 years    Quit date: 02/25/1998  . Smokeless tobacco: Never Used  . Alcohol Use: No  . Drug Use: No  . Sexual Activity: Not on file   Other Topics Concern  . Not on file   Social History Narrative    Current Medications, Allergies, Past Medical History, Past Surgical History, Family History, and Social History were reviewed in [employer] record.   Review of Systems             All symptoms NEG except where BOLDED >>  Constitutional:  F/C/S, fatigue, anorexia, unexpected weight change. HEENT:  HA, visual changes, hearing loss, earache, nasal symptoms, sore throat, mouth sores, hoarseness. Resp:  cough, sputum, hemoptysis; SOB, tightness, wheezing. Cardio:  CP, palpit, DOE, orthopnea, edema. GI:  N/V/D/C, blood in stool; reflux, abd pain, distention, gas. GU:  dysuria, freq, urgency, hematuria, flank pain, voiding difficulty. MS:  joint pain, swelling, tenderness, decr ROM; neck pain, back pain, etc. Neuro:  HA, tremors, seizures, dizziness, syncope, weakness, numbness, gait abn. Skin:  suspicious lesions or skin rash. Heme:  adenopathy, bruising, bleeding. Psyche:  confusion, agitation, sleep disturbance, hallucinations, anxiety, depression suicidal.   Objective:   Physical Exam       Vital Signs:  Reviewed...  General:  WD, WN, 70 y/o WF in NAD; alert & oriented; pleasant & cooperative... HEENT:  [place]/AT; Conjunctiva- pink, Sclera- nonicteric, EOM-wnl, PERRLA, EACs-clear, TMs-wnl; NOSE-clear; THROAT-clear & wnl. Neck:  Supple w/ fair  ROM; no JVD; normal carotid impulses w/o bruits; no thyromegaly or nodules palpated; no lymphadenopathy. Chest:  Bibasilar rales and rhonchi, w/o wheezing or signs of consolidation... Heart:  Regular Rhythm; norm S1 & S2 without murmurs, rubs, or gallops detected. Abdomen:  Soft & nontender- no guarding or rebound; normal bowel sounds; no organomegaly or masses palpated. Ext:  decrROM; without deformities or  arthritic changes; no varicose veins, +venous insuffic, tr edema;  Pulses intact w/o bruits. Neuro:  CNs II-XII intact; motor testing normal; sensory testing normal; gait normal & balance OK. Derm:  No lesions noted; no rash etc. Lymph:  No cervical, supraclavicular, axillary, or inguinal adenopathy palpated.   Assessment:      IMP >>     COPD/ Emphysema>  GOLD Stage 3 COPD/ Emphysema    Hx Stage 3A non-small cell lung cancer (squamous cell ca) Dx 12/2012- treated by DrMohamed & DrKinard w/ chemoradiation followed by consolidation chemotherapy; TSurg declined resection & she has been on observation since 05/2013     Ex cigarette smoker>  She quit smoking in 2000    MEDICAL issues>   Hypothyroid, HH/ GERD/ IBS, hx cervical cancer, Anxiety... Followed by Dawn Mercer.  PLAN >>     Laylynn should benefit from a more aggressive bronchodilator regimen- in this regard she indicated that cost would be a factor so we decided to start Rx w/ NEBULIZER using DUONEB Tid, and PREDNISONE '20mg'$ => '10mg'$  over the next month;  She will also try MUCINEX '600mg'$ - 2Bid, w/ fluids and use her rescue inhaler as needed... We will plan a recheck in 11mo     Plan:     Patient's Medications  New Prescriptions   IPRATROPIUM-ALBUTEROL (DUONEB) 0.5-2.5 (3) MG/3ML SOLN    Take 3 mLs by nebulization 3 (three) times daily.   PREDNISONE (DELTASONE) 20 MG TABLET    Take 1 tab QAM x 2 weeks, 1/2tab QAM until next OV  Previous Medications   ALBUTEROL (PROVENTIL HFA;VENTOLIN HFA) 108 (90 BASE) MCG/ACT INHALER    Inhale 1 puff into  the lungs every 6 (six) hours as needed for wheezing.   ALPRAZOLAM (XANAX) 0.5 MG TABLET    Take 0.5 tablets (0.25 mg total) by mouth 2 (two) times daily as needed for anxiety.   CHOLECALCIFEROL (VITAMIN D-3) 5000 UNITS TABS    Take 5,000 Units by mouth 2 (two) times a week.   DEXILANT 60 MG CAPSULE    Take 60 mg by mouth daily as needed.    GABAPENTIN (NEURONTIN) 100 MG CAPSULE    Take 1 capsule (100 mg total) by mouth 3 (three) times daily.   HYDROCODONE-ACETAMINOPHEN (NORCO) 5-325 MG TABLET    Take 1 tablet by mouth every 6 (six) hours as needed.   LEVOTHYROXINE (SYNTHROID, LEVOTHROID) 112 MCG TABLET    Take 112 mcg by mouth daily before breakfast.   OXYGEN    Inhale into the lungs. 3 lpm with exertion  Modified Medications   No medications on file  Discontinued Medications   MULTIPLE VITAMINS-MINERALS (MULTIVITAMIN ADULTS 50+ PO)    Take by mouth. Reported on 05/31/2015

## 2015-05-31 NOTE — Patient Instructions (Signed)
Caila-- it was nice meeting you today...  Today we reviewed your extensive history, checked a follow up CXR & Spirometry breathing test...    We will contact you w/ the results when available...   We decided to start therapy for your COPD & lung inflammation with the following>    1)  NEBULIZER w/ DUONEB medication- use in NEB three times daily every day...    2)  Start PREDNISONE '20mg'$  tabs as follows>       Start w/ one tab each AM for 2 weeks...       Then decrease to 1/2 tab each AM til return office visit in about 1 month...  OK to continue using the ALBUTEROL rescue inhaler as needed...  Add-in OTC MUCINEX '600mg'$  tabs- take 2 tabs twice daily w/ lots of fluids...  Continue your OXYGEN at 2-3 L/min by nasal cannula  Call for any questions...  Let's plan a follow up visit in about 1 month, sooner if needed for breathing problems.Marland KitchenMarland Kitchen

## 2015-06-01 ENCOUNTER — Telehealth: Payer: Self-pay | Admitting: Pulmonary Disease

## 2015-06-01 NOTE — Telephone Encounter (Signed)
Called and spoke to [place], with [facility]. Dawn Mercer states they have not received the Duoneb rx. Pt was seen by SN on 05/31/2015 and rx was printed and referral placed.   PCC's please advise if this has been sent. Thanks.

## 2015-06-01 NOTE — Progress Notes (Signed)
Quick Note:  lmtcb for pt. ______ 

## 2015-06-01 NOTE — Telephone Encounter (Signed)
Yesterday I mistakenly faxed Rx to [facility] office instead of to pharmacy.  Confirmation was received from fax that I sent.  I kept the Rx.  I just faxed it to [facility].  I called the office & answering service answered.  I told them I just faxed the Rx to the pharmacy but it was faxed to their office yesterday.  Nothing further needed.

## 2015-06-06 ENCOUNTER — Telehealth: Payer: Self-pay | Admitting: Pulmonary Disease

## 2015-06-06 NOTE — Telephone Encounter (Signed)
Patient returned call, CB [phone].  States does not need a call back about the msg unless we need to tell her something.

## 2015-06-06 NOTE — Telephone Encounter (Signed)
Attempted to contact pt. No answer, no option to leave a message. Will try back.  

## 2015-06-06 NOTE — Telephone Encounter (Signed)
Spoke with pt, aware that I will let Dr Lenna Gilford know that she tolerated the 1/2 vial Albuterol better than one whole dose. (previous telephone note) Aware that we will call if anything further needed.  Just FYI for SN. thanks

## 2015-06-06 NOTE — Telephone Encounter (Signed)
Per SN: confirm that it's the duoneb, then tell her to use 1/2 vial in machine TID.  Thanks.  Called spoke with patient who reported that she begins to feel jittery directly after taking a Duoneb neb treatment and the symptoms generally last about 59mns.  She reports this with every neb treatment.  Advised pt to try 1/2 vial as recommended by SN above and pt is okay with this recommendation and voiced her understanding.  She will call back if this does not help her symptoms.  Nothing further needed; will sign off.

## 2015-06-06 NOTE — Telephone Encounter (Signed)
Last ov with SN on 05/31/15      Patient Instructions       Dawn Mercer-- it was nice meeting you today...  Today we reviewed your extensive history, checked a follow up CXR & Spirometry breathing test...    We will contact you w/ the results when available...    We decided to start therapy for your COPD & lung inflammation with the following>    1)  NEBULIZER w/ DUONEB medication- use in NEB three times daily every day...    2)  Start PREDNISONE '20mg'$  tabs as follows>       Start w/ one tab each AM for 2 weeks...       Then decrease to 1/2 tab each AM til return office visit in about 1 month...  OK to continue using the ALBUTEROL rescue inhaler as needed...  Add-in OTC MUCINEX '600mg'$  tabs- take 2 tabs twice daily w/ lots of fluids...  Continue your OXYGEN at 2-3 L/min by nasal cannula  Call for any questions...  Let's plan a follow up visit in about 1 month, sooner if needed for breathing problems...   Called and spoke with pt. She states that she was started on Duoneb at her last ov with SN. She states that [facility] brought out the neb machine and medication on 06/05/15. She c/o that since starting the medication on 06/06/15 she has the jitters and is unable to keep taking this medication. She is requesting a message be sent to [facility] for an alternative medication. I explained to her that I would send the message to SN and would contact her once we received SN's recs. She voiced understanding and had no further questions.   Allergies  Allergen Reactions  . Latex Dermatitis  . Codeine Nausea Only     Current outpatient prescriptions:  .  albuterol (PROVENTIL HFA;VENTOLIN HFA) 108 (90 BASE) MCG/ACT inhaler, Inhale 1 puff into the lungs every 6 (six) hours as needed for wheezing., Disp: , Rfl:  .  ALPRAZolam (XANAX) 0.5 MG tablet, Take 0.5 tablets (0.25 mg total) by mouth 2 (two) times daily as needed for anxiety., Disp: 90 tablet, Rfl: 0 .  Cholecalciferol (VITAMIN D-3) 5000 UNITS TABS, Take 5,000  Units by mouth 2 (two) times a week., Disp: , Rfl:  .  DEXILANT 60 MG capsule, Take 60 mg by mouth daily as needed. , Disp: , Rfl:  .  gabapentin (NEURONTIN) 100 MG capsule, Take 1 capsule (100 mg total) by mouth 3 (three) times daily., Disp: 270 capsule, Rfl: 1 .  HYDROcodone-acetaminophen (NORCO) 5-325 MG tablet, Take 1 tablet by mouth every 6 (six) hours as needed., Disp: 150 tablet, Rfl: 0 .  ipratropium-albuterol (DUONEB) 0.5-2.5 (3) MG/3ML SOLN, Take 3 mLs by nebulization 3 (three) times daily., Disp: 360 mL, Rfl: 11 .  levothyroxine (SYNTHROID, LEVOTHROID) 112 MCG tablet, Take 112 mcg by mouth daily before breakfast., Disp: , Rfl:  .  OXYGEN, Inhale into the lungs. 3 lpm with exertion, Disp: , Rfl:  .  predniSONE (DELTASONE) 20 MG tablet, Take 1 tab QAM x 2 weeks, 1/2tab QAM until next OV, Disp: 30 tablet, Rfl: 0

## 2015-06-07 ENCOUNTER — Telehealth: Payer: Self-pay | Admitting: Pulmonary Disease

## 2015-06-07 NOTE — Telephone Encounter (Signed)
Spoke with pt. States that Duoneb is making her "jittery." She was unaware that this would make her feel this way. In the past her PCP told her that she could not use albuterol HFA because of this side effect. Wants to know if SN wants her to continue this or change to something else.  Allergies  Allergen Reactions  . Latex Dermatitis  . Codeine Nausea Only    SN - please advise. Thanks.

## 2015-06-07 NOTE — Telephone Encounter (Signed)
I had already spoken to pt yesterday 4.11.17 and advised her to take 1/2 vial TID as recommended by SN.  Pt stated that the jitteriness is better on 1/2 vial TID > they are not "as bad" and only last for about 10-15 mins where they lasted for approx 30 mins on a full vial.  Pt stated that she can "live with it" if she needs to but would like to know if there are any alternatives.  Pt is okay with a call back tomorrow.  Dr Lenna Gilford please advise, thank you.

## 2015-06-08 ENCOUNTER — Telehealth: Payer: Self-pay | Admitting: Pulmonary Disease

## 2015-06-08 MED ORDER — LEVALBUTEROL HCL 0.63 MG/3ML IN NEBU: 0.6300 mg | INHALATION_SOLUTION | Freq: Three times a day (TID) | RESPIRATORY_TRACT | Status: DC

## 2015-06-08 MED ORDER — UMECLIDINIUM BROMIDE 62.5 MCG/INH IN AEPB: 1.0000 | INHALATION_SPRAY | Freq: Every day | RESPIRATORY_TRACT | Status: DC

## 2015-06-08 NOTE — Telephone Encounter (Signed)
Spoke with pt and gave SN's recommendations for medication changes. Pt agreed. Rx's sent to [facility]. Pt states she will call and let us know how she does with new meds.

## 2015-06-08 NOTE — Telephone Encounter (Signed)
Rx signed by SN and placed in [facility] lookat

## 2015-06-08 NOTE — Telephone Encounter (Signed)
Per SN: other alternatives are all more expensive.  Option to change Duoneb to Xopenex 0.'63mg'$  TID and add Incruse 1 puff QD.  Thanks.

## 2015-06-08 NOTE — Telephone Encounter (Signed)
Spoke with pt and she states that she has changed her mind about starting the new medications that SN had recommended. She would like to wait until her next OV to discuss with him. Called [facility] and canceled rx for Incruse. Rx for Xopenex had not been sent. Nothing further needed.

## 2015-06-08 NOTE — Telephone Encounter (Signed)
Kathlee Nations @ [facility] wanted to clarify that pt could use portable oxygen device. Per order this should be fine. Nothing further needed.

## 2015-06-23 ENCOUNTER — Telehealth: Payer: Self-pay | Admitting: Pulmonary Disease

## 2015-06-23 DIAGNOSIS — J449 Chronic obstructive pulmonary disease, unspecified: Secondary | ICD-10-CM

## 2015-06-23 MED ORDER — ALBUTEROL SULFATE (2.5 MG/3ML) 0.083% IN NEBU: 2.5000 mg | INHALATION_SOLUTION | Freq: Four times a day (QID) | RESPIRATORY_TRACT | Status: DC | PRN

## 2015-06-23 NOTE — Telephone Encounter (Signed)
[phone], pt cb

## 2015-06-23 NOTE — Telephone Encounter (Signed)
Patient states when she came in to see Dr. Lenna Gilford, the nurse advised her that her insurance will not cover Nebulizer.  The nurse told patient that she would have to use [facility] to get nebulizer.  Patient said she called [facility] and they told her that they would cover the nebulizer and that all she would have to pay for her Neb and O2 is $25 per month.  Whereas, if she uses [facility], she would have to pay $25 per month to [facility] and another $25 per month to [facility] for her O2.  She wants to have the order for Neb and meds sent to [facility] and wants to D/C order through [facility].  Judeen Hammans, do I need to add another order or can this be sent from the original order Joellen Jersey put in? Please advise.

## 2015-06-23 NOTE — Telephone Encounter (Signed)
lmtcb x1 for pt. 

## 2015-06-23 NOTE — Telephone Encounter (Signed)
The patient has Dawn Mercer - can not bill [facility] Medicare insurance. [facility] told her they can file the insurance for the nebulizer but she probably didn't tell them what insurance she has or they didn't discuss the meds with her.  They can not do her medication.

## 2015-06-23 NOTE — Telephone Encounter (Signed)
Patient notified of Sherry's message.  Patient says that she is going to contact [facility] and [facility] to see if she can get the Albuterol medication cheaper.  She wants to get the Neb machine from [facility] since she gets her Oxygen through them, she doesn't want to pay another DME company for the [place] if she can get the medication elsewhere for cheaper.  Patient states she will call back to advise [place] of her decision as to what she wants to do about her Neb/Albuterol.    Awaiting call back from patient.

## 2015-06-23 NOTE — Telephone Encounter (Signed)
Spoke with pt. She would like for her neb solution to go to [place] [facility]. Order for neb machine needs to go [facility]. All orders have been placed. Nothing further was needed.

## 2015-06-27 ENCOUNTER — Other Ambulatory Visit: Payer: Self-pay

## 2015-06-27 ENCOUNTER — Telehealth: Payer: Self-pay | Admitting: Pulmonary Disease

## 2015-06-27 NOTE — Telephone Encounter (Signed)
Patient is currently using [facility] and [facility].  Patient states she wants to keep all equipment with [facility] and does not want to use [facility] at all.   Patient states that she contacted [facility] and they are picking up their equipment.  She said to mark our chart that she is only using [facility] for her O2, Neb and Neb meds from this day forth. Patient states that she does not need anything at this time.  She will contact our office if she needs anything. Nothing further needed.

## 2015-06-28 ENCOUNTER — Ambulatory Visit (HOSPITAL_BASED_OUTPATIENT_CLINIC_OR_DEPARTMENT_OTHER): Payer: Medicare Other

## 2015-06-28 DIAGNOSIS — Z85118 Personal history of other malignant neoplasm of bronchus and lung: Secondary | ICD-10-CM | POA: Diagnosis not present

## 2015-06-28 DIAGNOSIS — Z452 Encounter for adjustment and management of vascular access device: Secondary | ICD-10-CM | POA: Diagnosis not present

## 2015-06-28 DIAGNOSIS — C3411 Malignant neoplasm of upper lobe, right bronchus or lung: Secondary | ICD-10-CM

## 2015-06-28 MED ORDER — SODIUM CHLORIDE 0.9 % IJ SOLN
10.0000 mL | INTRAMUSCULAR | Status: DC | PRN
  Administered 2015-06-28: 10 mL via INTRAVENOUS
  Filled 2015-06-28: qty 10

## 2015-06-28 MED ORDER — HEPARIN SOD (PORK) LOCK FLUSH 100 UNIT/ML IV SOLN
500.0000 [IU] | Freq: Once | INTRAVENOUS | Status: AC | PRN
  Administered 2015-06-28: 500 [IU] via INTRAVENOUS
  Filled 2015-06-28: qty 5

## 2015-07-03 ENCOUNTER — Telehealth: Payer: Self-pay | Admitting: Pulmonary Disease

## 2015-07-03 NOTE — Telephone Encounter (Signed)
Spoke with pt and given appt with Dr Lenna Gilford 07/05/15 at 9:30.

## 2015-07-05 ENCOUNTER — Ambulatory Visit (INDEPENDENT_AMBULATORY_CARE_PROVIDER_SITE_OTHER): Payer: Medicare Other | Admitting: Pulmonary Disease

## 2015-07-05 ENCOUNTER — Encounter: Payer: Self-pay | Admitting: Pulmonary Disease

## 2015-07-05 VITALS — BP 124/74 | HR 88 | Temp 97.2°F | Ht 66.0 in | Wt 194.5 lb

## 2015-07-05 DIAGNOSIS — J9611 Chronic respiratory failure with hypoxia: Secondary | ICD-10-CM | POA: Diagnosis not present

## 2015-07-05 DIAGNOSIS — C3411 Malignant neoplasm of upper lobe, right bronchus or lung: Secondary | ICD-10-CM | POA: Diagnosis not present

## 2015-07-05 DIAGNOSIS — J449 Chronic obstructive pulmonary disease, unspecified: Secondary | ICD-10-CM

## 2015-07-05 MED ORDER — UMECLIDINIUM BROMIDE 62.5 MCG/INH IN AEPB: 1.0000 | INHALATION_SPRAY | Freq: Every day | RESPIRATORY_TRACT | Status: DC

## 2015-07-05 MED ORDER — FLUTICASONE FUROATE-VILANTEROL 100-25 MCG/INH IN AEPB: 1.0000 | INHALATION_SPRAY | Freq: Every day | RESPIRATORY_TRACT | Status: DC

## 2015-07-05 NOTE — Patient Instructions (Addendum)
Today we updated your med list in our EPIC system...    We discussed MEDS for your COPD...    Please call your Medicare part D plan for a copy of their [place] & take this to all your doctor appts...  We will try 2 breathing meds>  Apply to the drug company Pt ASSISTANCE PROGRAM to [place] one inhalation daily...    INCRUSE one inhalation daily...  We discussed continuing PREDNISONE '10mg'$  daily each AM during the month of May...    And decreasing it to '5mg'$  per day (1/2 of the '10mg'$  tab each AM) starting in June (as long as you breathing is stable) & stay on this til return OV in August...  Finally we did a brief WALK test today & your Oxygen dropped after 1 Lap around the nurses station...    This means that you need to use your O2 w/ any & all activity!!!  We will also order an OVERNIGHT OXIMETRY test to see if you NEED THE OXYGEN at night too...    We will contact you w/ the results when available...    Call for any questions...  Let's plan a follow up visit in 80mo sooner if needed for problems..Marland KitchenMarland Kitchen

## 2015-07-06 ENCOUNTER — Encounter: Payer: Self-pay | Admitting: Pulmonary Disease

## 2015-07-06 NOTE — Progress Notes (Signed)
Subjective:     Patient ID: Dawn Mercer, female   DOB: January 06, 1946, 70 y.o.   MRN: [id number]  HPI  ~  May 31, 2015:  Initial pulmonary evaluation by SN>   71 y/o WF, referred by Dr.C Melinda Crutch, [facility] at [facility], for a pulmonary work-up due to dyspnea & hypoxemia>     Dawn Mercer is an ex-smoker and was diagnosed w/ Squamous cell lung cancer in 2014 after presenting w/ right sided CWP and a CXR showed a right lung mass;  CT showed a 1.7cm RUL mass w/ some hilar & mediastinal adenopathy that were all PET pos;  She had a navigational bronch w/ EBUS bxs of the nodes per [place] 12/2012 => atypical cells but not suffic for diagnosis; subsequest TTBx by DrYamagata was pos for Squamous cell lung cancer;  DrMohammed has supervised her treatment for this Stage IIIa non-small cell tumor- starting w/ chemoradiation w/ carboplatin & paclitaxel while receiving XRT- 63gray in 35 fractions completed 03/23/13;  This was followed by consolidation chemotherapy for 3 more cycles (last dose 06/14/13); f/u CT Chest showed a partial response;  She was then seen back by the thoracic surgeons to consider poss surgical resection of the RUL;  MRI brain was neg for signs of metastatic dis;  Repeat PET scan showed decr size of the RUL mass and resolution of the metabolic activity in the mediastinal nodes, no new dis;  PFTs showed COPD Stage-3 COPD w/ DLCO reduced at 29%;  They did not recommend proceeding w/ surgical resection...     She has continued to f/u w/ DrMohammed> Last seen 04/12/15>  Note reviewed, she remains on observation x 37yr now, CT Chest 04/05/15 showed norm heart size, right sided port-a-cath, no pathologically enlarged nodes, biapical pleuroparenchymal scarring & mod centrilobular emphysema w/ bullous dis on the left, 745mRUL nodule- stable, left adrenal adenoma- see full report; f/u planned in 52m59mo  Smoking Hx>  She is an ex-smoker having started age 89,852moked for 35 yrs up to 1ppd, quit in 2000 w/  diagnosis of cervical cancer...  Pulmonary Hx>  She denies hx of signif lung problems- never told to have asthma, rare bronchitic infections even when smoking, never had pneumonia, no known TB or exposure...   Medical Hx>     Hypothyroid, HH/ GERD/ IBS, Anxiety...  She also has a hx of early stage cervical cancer- treated by DrKinard w/ XRT & intracavitary brachytherapy using cesium, no recurrence in continued follow up...  Family Hx>  One brother had small cell lung cancer, no other lung dis in the family...  Occup Hx>  She denies known exposure to asbestos, silica dust, other inorganic or organic dusts, etc...  Current Meds>  O2 at 2-3L/min;  AlbuterolHFA prn;  Dexilant60,  Neurontin100Tid;  Vicodin5 Q6H prn, Synthroid112, Xanax0.5-1/2 Bid prn;  VitD 5000u...  PET scan 12/17/12 showed 54m57mL hypermet nodule, underlying mod centrilob & paraseptal emophysema, hypermet precarinal & right hilar adenopathy, brachytherapy seeds seen in region of cervix, healing fx R 6th Rib  Last CXR 07/30/13>  Port-a-cath on right, underlying COPD/emphysema, RUL nodule difficult to see on CXR...   Prev Full PFTs 08/19/13>  FVC=1.64 (52%), FEV1=1.03 (43%), %1sec=63, mid-flows reduced at 26% predicted; post bronchodil FEV1 improved 3%;  TLC=4.39 (86%);  DLCO=29% and DL/VA=55...  Last CT Chest 04/05/15>  Port-a-cath on right, norm heart size & coronary calcif, no enlarged nodes, biapical pleuro-parenchymal scarring R>L w/ 7mm 22m nodule (stable), mod centrilob emphysema w/ bullae on left,  architectural distortion in RUL, cholecystectomy, left adrenal adenoma...   Last Labs 03/2015>  CBC- wnl;  Chems- ok x Alb=3.4 EXAM shows Afeb, VSS, O2sat=94% on 3LO2; Wt=188#, 5'6"Tall, BMI=30;  HEENT- nasalO2, mallampati1;  Chest- bibasilar crackles, few scat rhonchi, no wheezing;  Heart- RR w/o m/r/g;  Abd- protuberant soft nontender;  Ext- w/o c/c/e.   CXR 05/31/15> port-a-cath on right, borderline heart size, COPD w/ scarring  RUL likely due to XRT, no acute changes on left...  Spirometry 05/31/15>  FVC=1.35 (43%), FEV1=0.88 (36%), %1sec=65, mid-flows reduced at 16% predicted => c/w mod airflow obstruction and GOLD Stage3 COPD... IMP >>     COPD/ Emphysema>  GOLD Stage 3 COPD/ Emphysema    Hx hypoxemic resp failure> on 2L/min O2    Hx Stage 3A non-small cell lung cancer (squamous cell ca) Dx 12/2012- treated by DrMohamed & DrKinard w/ chemoradiation followed by consolidation chemotherapy; TSurg declined resection & she has been on observation since 05/2013     Ex cigarette smoker>  She quit smoking in 2000    MEDICAL issues>   Hypothyroid, HH/ GERD/ IBS, hx cervical cancer, Anxiety... Followed by [employer]. PLAN >>     Dawn Mercer should benefit from a more aggressive bronchodilator regimen- in this regard she indicated that cost would be a factor so we decided to start Rx w/ NEBULIZER using DUONEB Tid, and PREDNISONE '20mg'$ => '10mg'$  over the next month;  She will also try MUCINEX '600mg'$ - 2Bid, w/ fluids and use her rescue inhaler as needed... We will plan a recheck in 23mo   ~  Jul 05, 2015:  161moOV w/ SN>  [age]/o WF seen 05/31/15 for pulm consult due to dyspnea- she is an ex-smoker w/ severe COPD/emphysema & hx RUL (staged IIIa) squamous cell lung cancer (12/2012) treated w/ chemoRx & XRT- followed by drMohammed on observation for the past 2y41yr We maximized her bronchodil regimen w/ DUONEB tid, Pred20=>'10mg'$ /d, Mucinex600-2Bid, ProairHFA rescue as needed (cost was a huge factor in defining her meds); she is also on O2 at 2L/min pulse dose from her POC...  She returns today noting sl jittery on the NEBs & she has decr the dose to 1/2 vial & deals w/ it (but only using this prn); Pred really helps she says; she is only using the O2 "prn" too;  She says she's breathing OK, min cough, sm amt clear sput, no hemoptysis, denies any change in SOB or CP EXAM shows Afeb, VSS, O2sat=92% on RA; Wt=195# (up7#);  HEENT- nasalO2, mallampati1;  Chest-  bibasilar crackles, few scat rhonchi, no wheezing;  Heart- RR w/o m/r/g;  Abd- protuberant soft nontender;  Ext- w/o c/c/e.   Ambulatory O2sat>  O2sat=92% on RA at rest w/ pulse=96/min;  She ambulated just 1 Lap w/ drop in O2sat to 77% w/ pulse=101/min... IMP/PLAN>>  Dawn Mercer NOT using her NEBs or Oxygen- says she prefers inhalers and we will order BREO one inhalation daily + INCRUSE one inhalation daily; stay on Pred '10mg'$ /d & try to wean to '5mg'$ /d if able; I stressed the severity of her COPD/emphysema and the NEED for regular medication & OXYGEN w/ all activities and Qhs... ADDENDUM>>  ONO on RA performed 07/09/15>  She slept for 7H, spent 5H20m60m88%sat, ans 40mi53m0% w/ nadir O2sat=72%... She is rec to use her Home O2 24/7 w/ 2L/min at rest and 3L/min w/ exercise...    Past Medical History  Diagnosis Date  . 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 Status  post radiation therapy 10/1999  . GERD (gastroesophageal reflux disease)   . Hypothyroidism   . IBS (irritable bowel syndrome)     Hx: of  . COPD (chronic obstructive pulmonary disease) ([place])   . Shortness of breath     Hx: of with exertion  . Bronchitis     Hx: of  . Hay fever     Hx: of  . Seizures ([place])     Hx: of over 40 years ago  . H/O hiatal hernia   . History of radiation therapy 01/28/13-03/23/13    63 gray to right upper lobe  . 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 Lung cancer, upper lobe ([place]) dx'd 12/2012    chemo and XRt complete  . Anxiety     Past Surgical History  Procedure Laterality Date  . Laser ablation  04/2004    for VIN III  . Colonoscopy w/ biopsies and polypectomy      Hx: of  . Tubal ligation    . Video bronchoscopy with endobronchial navigation N/A 12/31/2012    Procedure: VIDEO BRONCHOSCOPY WITH ENDOBRONCHIAL NAVIGATION;  Surgeon: Melrose Nakayama, MD;  Location: [place];  Service: Thoracic;  Laterality: N/A;  . Video bronchoscopy with  endobronchial ultrasound N/A 12/31/2012    Procedure: VIDEO BRONCHOSCOPY WITH ENDOBRONCHIAL ULTRASOUND;  Surgeon: Melrose Nakayama, MD;  Location: [facility];  Service: Thoracic;  Laterality: N/A;  . Cholecystectomy N/A 08/03/2013    Procedure: LAPAROSCOPIC CHOLECYSTECTOMY;  Surgeon: Harl Bowie, MD;  Location: [place];  Service: General;  Laterality: N/A;    Outpatient Encounter Prescriptions as of 05/31/2015  Medication Sig  . albuterol (PROVENTIL HFA;VENTOLIN HFA) 108 (90 BASE) MCG/ACT inhaler Inhale 1 puff into the lungs every 6 (six) hours as needed for wheezing.  Marland Kitchen ALPRAZolam (XANAX) 0.5 MG tablet Take 0.5 tablets (0.25 mg total) by mouth 2 (two) times daily as needed for anxiety.  . Cholecalciferol (VITAMIN D-3) 5000 UNITS TABS Take 5,000 Units by mouth 2 (two) times a week.  . DEXILANT 60 MG capsule Take 60 mg by mouth daily as needed.   . gabapentin (NEURONTIN) 100 MG capsule Take 1 capsule (100 mg total) by mouth 3 (three) times daily.  Marland Kitchen HYDROcodone-acetaminophen (NORCO) 5-325 MG tablet Take 1 tablet by mouth every 6 (six) hours as needed.  Marland Kitchen levothyroxine (SYNTHROID, LEVOTHROID) 112 MCG tablet Take 112 mcg by mouth daily before breakfast.  . OXYGEN Inhale into the lungs. 3 lpm with exertion  . [DISCONTINUED] Multiple Vitamins-Minerals (MULTIVITAMIN ADULTS 50+ PO) Take by mouth. Reported on 05/31/2015    Allergies  Allergen Reactions  . Latex Dermatitis  . Codeine Nausea Only    Immunization History  Administered Date(s) Administered  . Influenza,inj,Quad PF,36+ Mos 02/15/2013, 04/25/2014  . Pneumococcal Conjugate-13 05/31/2015  . Pneumococcal Polysaccharide-23 11/20/1999  She has had the yearly Flu vaccine from PCP... Pt given PREVNAR-13 shot 05/31/15...   Family History  Problem Relation Age of Onset  . Heart disease Father   . Cervical cancer Sister   . Lung cancer Brother   . Heart disease Brother   . Cancer Brother     small cell lung cancer    Social History    Social History  . Marital Status: Married    Spouse Name: N/A  . Number of Children: N/A  . Years of Education: N/A   Occupational History  . Retired    Social History Main Topics  . Smoking  status: Former Smoker -- 1.00 packs/day for 15 years    Quit date: 02/25/1998  . Smokeless tobacco: Never Used  . Alcohol Use: No  . Drug Use: No  . Sexual Activity: Not on file   Other Topics Concern  . Not on file   Social History Narrative    Current Medications, Allergies, Past Medical History, Past Surgical History, Family History, and Social History were reviewed in [employer] record.   Review of Systems             All symptoms NEG except where BOLDED >>  Constitutional:  F/C/S, fatigue, anorexia, unexpected weight change. HEENT:  HA, visual changes, hearing loss, earache, nasal symptoms, sore throat, mouth sores, hoarseness. Resp:  cough, sputum, hemoptysis; SOB, tightness, wheezing. Cardio:  CP, palpit, DOE, orthopnea, edema. GI:  N/V/D/C, blood in stool; reflux, abd pain, distention, gas. GU:  dysuria, freq, urgency, hematuria, flank pain, voiding difficulty. MS:  joint pain, swelling, tenderness, decr ROM; neck pain, back pain, etc. Neuro:  HA, tremors, seizures, dizziness, syncope, weakness, numbness, gait abn. Skin:  suspicious lesions or skin rash. Heme:  adenopathy, bruising, bleeding. Psyche:  confusion, agitation, sleep disturbance, hallucinations, anxiety, depression suicidal.   Objective:   Physical Exam       Vital Signs:  Reviewed...   General:  WD, WN, 69 y/o WF in NAD; alert & oriented; pleasant & cooperative... HEENT:  [place]/AT; Conjunctiva- pink, Sclera- nonicteric, EOM-wnl, PERRLA, EACs-clear, TMs-wnl; NOSE-clear; THROAT-clear & wnl.  Neck:  Supple w/ fair ROM; no JVD; normal carotid impulses w/o bruits; no thyromegaly or nodules palpated; no lymphadenopathy.  Chest:  Bibasilar rales and rhonchi, w/o wheezing or signs of  consolidation... Heart:  Regular Rhythm; norm S1 & S2 without murmurs, rubs, or gallops detected. Abdomen:  Soft & nontender- no guarding or rebound; normal bowel sounds; no organomegaly or masses palpated. Ext:  decrROM; without deformities or arthritic changes; no varicose veins, +venous insuffic, tr edema;  Pulses intact w/o bruits. Neuro:  CNs II-XII intact; motor testing normal; sensory testing normal; gait normal & balance OK. Derm:  No lesions noted; no rash etc. Lymph:  No cervical, supraclavicular, axillary, or inguinal adenopathy palpated.   Assessment:      IMP >>     COPD/ Emphysema>  GOLD Stage 3 COPD/ Emphysema    Hx Stage 3A non-small cell lung cancer (squamous cell ca) Dx 12/2012- treated by DrMohamed & DrKinard w/ chemoradiation followed by consolidation chemotherapy; TSurg declined resection & she has been on observation since 05/2013     Ex cigarette smoker>  She quit smoking in 2000    MEDICAL issues>   Hypothyroid, HH/ GERD/ IBS, hx cervical cancer, Anxiety... Followed by [employer].  PLAN >>  05/31/15>   Dawn Mercer should benefit from a more aggressive bronchodilator regimen- in this regard she indicated that cost would be a factor so we decided to start Rx w/ NEBULIZER using DUONEB Tid, and PREDNISONE '20mg'$ => '10mg'$  over the next month;  She will also try MUCINEX '600mg'$ - 2Bid, w/ fluids and use her rescue inhaler as needed... We will plan a recheck in 33mo 07/05/15>   Dawn Mercer NOT using her NEBs or Oxygen- says she prefers inhalers and we will order BREO one inhalation daily + INCRUSE one inhalation daily; stay on Pred '10mg'$ /d & try to wean to '5mg'$ /d if able; I stressed the severity of her COPD/emphysema and the NEED for regular medication & OXYGEN w/ all activities and Qhs..Marland KitchenMarland Kitchen  Plan:     Patient's Medications  New Prescriptions   FLUTICASONE FUROATE-VILANTEROL (BREO ELLIPTA) 100-25 MCG/INH AEPB    Inhale 1 puff into the lungs daily.   UMECLIDINIUM BROMIDE (INCRUSE ELLIPTA) 62.5  MCG/INH AEPB    Inhale 1 puff into the lungs daily.  Previous Medications   ALBUTEROL (PROVENTIL HFA;VENTOLIN HFA) 108 (90 BASE) MCG/ACT INHALER    Inhale 1 puff into the lungs every 6 (six) hours as needed for wheezing.   ALBUTEROL (PROVENTIL) (2.5 MG/3ML) 0.083% NEBULIZER SOLUTION    Take 3 mLs (2.5 mg total) by nebulization every 6 (six) hours as needed for wheezing or shortness of breath.   ALPRAZOLAM (XANAX) 0.5 MG TABLET    Take 0.5 tablets (0.25 mg total) by mouth 2 (two) times daily as needed for anxiety.   CHOLECALCIFEROL (VITAMIN D-3) 5000 UNITS TABS    Take 5,000 Units by mouth 2 (two) times a week.   DEXILANT 60 MG CAPSULE    Take 60 mg by mouth daily as needed.    GABAPENTIN (NEURONTIN) 100 MG CAPSULE    Take 1 capsule (100 mg total) by mouth 3 (three) times daily.   HYDROCODONE-ACETAMINOPHEN (NORCO) 5-325 MG TABLET    Take 1 tablet by mouth every 6 (six) hours as needed.   LEVOTHYROXINE (SYNTHROID, LEVOTHROID) 112 MCG TABLET    Take 112 mcg by mouth daily before breakfast.   OXYGEN    Inhale into the lungs. 3 lpm with exertion   PREDNISONE (DELTASONE) 20 MG TABLET    Take 1 tab QAM x 2 weeks, 1/2tab QAM until next OV  Modified Medications   No medications on file  Discontinued Medications   No medications on file

## 2015-07-12 ENCOUNTER — Telehealth: Payer: Self-pay | Admitting: Pulmonary Disease

## 2015-07-12 DIAGNOSIS — J9611 Chronic respiratory failure with hypoxia: Secondary | ICD-10-CM

## 2015-07-12 DIAGNOSIS — J449 Chronic obstructive pulmonary disease, unspecified: Secondary | ICD-10-CM

## 2015-07-12 NOTE — Telephone Encounter (Signed)
Per SN >> ONO shows severe desaturation at night. Pt will need to start 2L/min at night time and 3L/min with exertion. Needs to wear 24/7 to protect her heart.  Attempted to contact pt. No answer. Voicemail has not been set up. Will try back.

## 2015-07-12 NOTE — Telephone Encounter (Signed)
Spoke with pt. She is aware of her ONO results. Order will be sent to [facility] to update her oxygen orders. Nothing further was needed.

## 2015-07-13 ENCOUNTER — Telehealth: Payer: Self-pay | Admitting: Pulmonary Disease

## 2015-07-13 MED ORDER — PREDNISONE 10 MG PO TABS: ORAL_TABLET | ORAL | Status: DC

## 2015-07-13 NOTE — Telephone Encounter (Signed)
Spoke with pt and clarified instructions for Prednisone dosing per Dr Lenna Gilford.  Pt verbalized understanding.  Rx for Prednisone sent to pharmacy.

## 2015-07-20 ENCOUNTER — Ambulatory Visit
Admission: RE | Admit: 2015-07-20 | Discharge: 2015-07-20 | Disposition: A | Discharge status: Home / Self Care | Payer: Medicare Other | Source: Ambulatory Visit | Attending: Radiation Oncology | Admitting: Radiation Oncology

## 2015-07-20 ENCOUNTER — Encounter: Payer: Self-pay | Admitting: Radiation Oncology

## 2015-07-20 VITALS — BP 132/75 | HR 83 | Temp 98.3°F | Resp 18 | Ht 66.0 in | Wt 196.5 lb

## 2015-07-20 DIAGNOSIS — Z8541 Personal history of malignant neoplasm of cervix uteri: Secondary | ICD-10-CM | POA: Diagnosis not present

## 2015-07-20 DIAGNOSIS — C3410 Malignant neoplasm of upper lobe, unspecified bronchus or lung: Secondary | ICD-10-CM | POA: Diagnosis present

## 2015-07-20 DIAGNOSIS — C3411 Malignant neoplasm of upper lobe, right bronchus or lung: Secondary | ICD-10-CM

## 2015-07-20 DIAGNOSIS — R911 Solitary pulmonary nodule: Secondary | ICD-10-CM | POA: Insufficient documentation

## 2015-07-20 DIAGNOSIS — Z923 Personal history of irradiation: Secondary | ICD-10-CM | POA: Diagnosis not present

## 2015-07-20 DIAGNOSIS — R0602 Shortness of breath: Secondary | ICD-10-CM | POA: Diagnosis not present

## 2015-07-20 MED ORDER — ALPRAZOLAM 0.5 MG PO TABS: 0.2500 mg | ORAL_TABLET | Freq: Two times a day (BID) | ORAL | Status: DC | PRN

## 2015-07-20 MED ORDER — HYDROCODONE-ACETAMINOPHEN 5-325 MG PO TABS: 1.0000 | ORAL_TABLET | Freq: Four times a day (QID) | ORAL | Status: DC | PRN

## 2015-07-20 NOTE — Progress Notes (Signed)
Radiation Oncology         [id number]) [phone] ________________________________  Name: Dawn Mercer MRN: [id number]  Date: 07/20/2015  DOB: 10-22-1945    Follow-Up Visit Note  CC: Harle Battiest, MD  Antony Blackbird, MD  Diagnosis: Stage IIIA (T1b., N2, M0) non-small cell lung cancer, squamous cell carcinoma presenting with right upper lobe lung nodule  Radiation Treatment Dates:-  -January 28, 2013 through March 23, 2013: Right upper lobe primary site and mediastinal area, 63 gray in 35 fractions.   -September 2001: Early stage cervical cancer. She underwent definitive treatment with radiation as well as intracavitary brachytherapy treatment using cesium under my direction at [facility]. She was followed for several years with no recurrence.  Narrative: Dawn Mercer here for follow up. She denies having pain. She reports shortness of breath with activity. She has oxygen that she is supposed to wear at all times but she has only been using it with activity. She denies having a cough or hemoptysis. She is taking prednisone 10 mg daily. She reports her energy level is OK. Denies pain in the chest. Uses a nebulizer. Takes hydrocodone 1x/day. Last chest CT scan showed no evidence of progressive disease.  ALLERGIES:  is allergic to latex and codeine.  Meds: Current Outpatient Prescriptions  Medication Sig Dispense Refill  . albuterol (PROVENTIL HFA;VENTOLIN HFA) 108 (90 BASE) MCG/ACT inhaler Inhale 1 puff into the lungs every 6 (six) hours as needed for wheezing.    Marland Kitchen albuterol (PROVENTIL) (2.5 MG/3ML) 0.083% nebulizer solution Take 3 mLs (2.5 mg total) by nebulization every 6 (six) hours as needed for wheezing or shortness of breath. 150 mL 11  . ALPRAZolam (XANAX) 0.5 MG tablet Take 0.5 tablets (0.25 mg total) by mouth 2 (two) times daily as needed for anxiety. 90 tablet 0  . Cholecalciferol (VITAMIN D-3) 5000 UNITS TABS Take 5,000 Units by mouth 2 (two) times a week.    . DEXILANT 60 MG  capsule Take 60 mg by mouth daily as needed.     . gabapentin (NEURONTIN) 100 MG capsule Take 1 capsule (100 mg total) by mouth 3 (three) times daily. 270 capsule 1  . HYDROcodone-acetaminophen (NORCO) 5-325 MG tablet Take 1 tablet by mouth every 6 (six) hours as needed. 150 tablet 0  . levothyroxine (SYNTHROID, LEVOTHROID) 112 MCG tablet Take 112 mcg by mouth daily before breakfast.    . OXYGEN Inhale into the lungs. 3 lpm with exertion    . predniSONE (DELTASONE) 10 MG tablet Take as directed 45 tablet 0  . fluticasone furoate-vilanterol (BREO ELLIPTA) 100-25 MCG/INH AEPB Inhale 1 puff into the lungs daily. (Patient not taking: Reported on 07/20/2015) 28 each 5  . umeclidinium bromide (INCRUSE ELLIPTA) 62.5 MCG/INH AEPB Inhale 1 puff into the lungs daily. (Patient not taking: Reported on 07/20/2015) 30 each 5   No current facility-administered medications for this encounter.    Physical Findings: The patient is in no acute distress. Patient is alert and oriented.  height is '5\' 6"'$  (1.676 m) and weight is 196 lb 8 oz (89.132 kg). Her oral temperature is 98.3 F (36.8 C). Her blood pressure is 132/75 and her pulse is 83. Her respiration is 18 and oxygen saturation is 96%. .  No palpable supraclavicular or axillary adenopathy. The lungs are clear to auscultation. The heart has regular rhythm and rate.   Lab Findings: Lab Results  Component Value Date   WBC 6.7 04/05/2015   HGB 13.8 04/05/2015   HCT 42.9 04/05/2015  MCV 86.6 04/05/2015   PLT 198 04/05/2015    Radiographic Findings: No results found.  Impression: The patient is recovering well from the effects of radiation. No evidence of recurrence on clinical exam today. She was recommended by Dr.Nadel to be on oxygen at all times, however the patient does not feel she needs it that often and uses it as needed.  Plan: She has a repeat CT scan in August with subsequent Dr.Mohamed follow up. I refilled her hydrocodone and Xanax. Follow up  in [place] in November.     -----------------------------------  Blair Promise, PhD, MD  This document serves as a record of services personally performed by Gery Pray, MD. It was created on his behalf by Derek Mound, a trained medical scribe. The creation of this record is based on the scribe's personal observations and the provider's statements to them. This document has been checked and approved by the attending provider.

## 2015-07-20 NOTE — Progress Notes (Addendum)
Dawn Mercer here for follow up.  She denies having pain.  She reports shortness of breath with activity.  She has oxygen that she is supposed to wear at all times but she has only been using it with activity.  She denies having a cough or hemoptysis.  She is taking prednisone 10 mg daily.  She reports her energy level is OK.  BP 132/75 mmHg  Pulse 83  Temp(Src) 98.3 F (36.8 C) (Oral)  Resp 18  Ht '5\' 6"'$  (1.676 m)  Wt 196 lb 8 oz (89.132 kg)  BMI 31.73 kg/m2  SpO2 96%    Wt Readings from Last 3 Encounters:  07/20/15 196 lb 8 oz (89.132 kg)  07/05/15 194 lb 8 oz (88.225 kg)  05/31/15 188 lb 6.4 oz (85.458 kg)

## 2015-07-21 NOTE — Addendum Note (Signed)
Encounter addended by: Jacqulyn Liner, RN on: 07/21/2015  7:47 AM<BR>     Documentation filed: Charges VN

## 2015-07-26 ENCOUNTER — Encounter: Payer: Self-pay | Admitting: Pulmonary Disease

## 2015-08-09 ENCOUNTER — Telehealth: Payer: Self-pay | Admitting: Pulmonary Disease

## 2015-08-09 ENCOUNTER — Ambulatory Visit (HOSPITAL_BASED_OUTPATIENT_CLINIC_OR_DEPARTMENT_OTHER): Payer: Medicare Other

## 2015-08-09 VITALS — BP 121/59 | HR 83 | Temp 98.2°F | Resp 20

## 2015-08-09 DIAGNOSIS — Z85118 Personal history of other malignant neoplasm of bronchus and lung: Secondary | ICD-10-CM | POA: Diagnosis not present

## 2015-08-09 DIAGNOSIS — Z452 Encounter for adjustment and management of vascular access device: Secondary | ICD-10-CM | POA: Diagnosis not present

## 2015-08-09 DIAGNOSIS — C3411 Malignant neoplasm of upper lobe, right bronchus or lung: Secondary | ICD-10-CM

## 2015-08-09 MED ORDER — HEPARIN SOD (PORK) LOCK FLUSH 100 UNIT/ML IV SOLN
500.0000 [IU] | Freq: Once | INTRAVENOUS | Status: AC | PRN
  Administered 2015-08-09: 500 [IU] via INTRAVENOUS
  Filled 2015-08-09: qty 5

## 2015-08-09 MED ORDER — SODIUM CHLORIDE 0.9 % IJ SOLN
10.0000 mL | INTRAMUSCULAR | Status: DC | PRN
  Administered 2015-08-09: 10 mL via INTRAVENOUS
  Filled 2015-08-09: qty 10

## 2015-08-09 NOTE — Telephone Encounter (Signed)
LMTCB

## 2015-08-09 NOTE — Patient Instructions (Signed)

## 2015-08-10 NOTE — Telephone Encounter (Signed)
Spoke with pt, states that she would like to cancel her large home concentrator and just wants to use her POC and nebulizer.  Pt states she cannot afford oxygen and is only using her POC with exertion such as going to the store.  Pt uses [facility].    SN please advise if you're ok with d/c'ing pt's home 02 system.  Thanks!

## 2015-08-10 NOTE — Telephone Encounter (Signed)
[phone], pt  Dawn Mercer

## 2015-08-10 NOTE — Telephone Encounter (Signed)
Attempted to contact pt. No answer, no option to leave a message. Will try back.  

## 2015-08-10 NOTE — Telephone Encounter (Signed)
Per SN-  Pt is at a great risk of MI or stroke if she goes without 02 based on ONO testing and walk test in office.  Pt can wear 02 with exertion and qhs and go without it at rest and with light exertion, but SN is not comfortable d/cing 02 at this time.   Spoke with pt, aware of recs.  Nothing further needed.

## 2015-08-17 ENCOUNTER — Emergency Department (HOSPITAL_COMMUNITY)
Admission: EM | Admit: 2015-08-17 | Discharge: 2015-08-17 | Disposition: A | Discharge status: Home / Self Care | Payer: Medicare Other | Attending: Emergency Medicine | Admitting: Emergency Medicine

## 2015-08-17 ENCOUNTER — Emergency Department (HOSPITAL_COMMUNITY): Payer: Medicare Other

## 2015-08-17 ENCOUNTER — Other Ambulatory Visit: Payer: Self-pay

## 2015-08-17 ENCOUNTER — Encounter (HOSPITAL_COMMUNITY): Payer: Self-pay | Admitting: Emergency Medicine

## 2015-08-17 DIAGNOSIS — Z87891 Personal history of nicotine dependence: Secondary | ICD-10-CM | POA: Insufficient documentation

## 2015-08-17 DIAGNOSIS — Z7982 Long term (current) use of aspirin: Secondary | ICD-10-CM | POA: Diagnosis not present

## 2015-08-17 DIAGNOSIS — Z85118 Personal history of other malignant neoplasm of bronchus and lung: Secondary | ICD-10-CM | POA: Insufficient documentation

## 2015-08-17 DIAGNOSIS — Z79899 Other long term (current) drug therapy: Secondary | ICD-10-CM | POA: Insufficient documentation

## 2015-08-17 DIAGNOSIS — R590 Localized enlarged lymph nodes: Secondary | ICD-10-CM

## 2015-08-17 DIAGNOSIS — R0789 Other chest pain: Secondary | ICD-10-CM | POA: Diagnosis present

## 2015-08-17 DIAGNOSIS — J449 Chronic obstructive pulmonary disease, unspecified: Secondary | ICD-10-CM | POA: Diagnosis not present

## 2015-08-17 DIAGNOSIS — Z8541 Personal history of malignant neoplasm of cervix uteri: Secondary | ICD-10-CM | POA: Insufficient documentation

## 2015-08-17 DIAGNOSIS — R599 Enlarged lymph nodes, unspecified: Secondary | ICD-10-CM | POA: Diagnosis not present

## 2015-08-17 DIAGNOSIS — E039 Hypothyroidism, unspecified: Secondary | ICD-10-CM | POA: Diagnosis not present

## 2015-08-17 LAB — BASIC METABOLIC PANEL
Anion gap: 8 (ref 5–15)
BUN: 8 mg/dL (ref 6–20)
CALCIUM: 8.8 mg/dL — AB (ref 8.9–10.3)
CHLORIDE: 105 mmol/L (ref 101–111)
CO2: 25 mmol/L (ref 22–32)
CREATININE: 0.8 mg/dL (ref 0.44–1.00)
GFR calc Af Amer: >60 mL/min (ref >60)
Glucose, Bld: 125 mg/dL — ABNORMAL HIGH (ref 65–99)
Potassium: 3.9 mmol/L (ref 3.5–5.1)
SODIUM: 138 mmol/L (ref 135–145)

## 2015-08-17 LAB — CBC WITH DIFFERENTIAL/PLATELET
BASOS ABS: 0 10*3/uL (ref 0.0–0.1)
BASOS PCT: 0 %
EOS ABS: 0.2 10*3/uL (ref 0.0–0.7)
Eosinophils Relative: 2 %
HCT: 42.7 % (ref 36.0–46.0)
HEMOGLOBIN: 13.7 g/dL (ref 12.0–15.0)
Lymphocytes Relative: 22 %
Lymphs Abs: 1.7 10*3/uL (ref 0.7–4.0)
MCH: 29 pg (ref 26.0–34.0)
MCHC: 32.1 g/dL (ref 30.0–36.0)
MCV: 90.5 fL (ref 78.0–100.0)
MONOS PCT: 6 %
Monocytes Absolute: 0.5 10*3/uL (ref 0.1–1.0)
NEUTROS ABS: 5.2 10*3/uL (ref 1.7–7.7)
NEUTROS PCT: 70 %
PLATELETS: 224 10*3/uL (ref 150–400)
RBC: 4.72 MIL/uL (ref 3.87–5.11)
RDW: 16 % — AB (ref 11.5–15.5)
WBC: 7.6 10*3/uL (ref 4.0–10.5)

## 2015-08-17 LAB — TROPONIN I: Troponin I: <0.03 ng/mL (ref <0.031)

## 2015-08-17 LAB — D-DIMER, QUANTITATIVE: D-Dimer, Quant: 1.54 ug/mL-FEU — ABNORMAL HIGH (ref 0.00–0.50)

## 2015-08-17 MED ORDER — IOPAMIDOL (ISOVUE-370) INJECTION 76%
100.0000 mL | Freq: Once | INTRAVENOUS | Status: AC | PRN
  Administered 2015-08-17: 100 mL via INTRAVENOUS

## 2015-08-17 NOTE — Discharge Instructions (Signed)
Your CT chest does not show blood clot. There are some enlarged lymph nodes and thickening of the lining of the right lung. Given your history of lung cancer you will need to talk to your oncologist about further workup of this to exclude cancer.   Return for worsening symptoms, including worsening pain, difficulty breathing, passing out, or any other symptoms concerning to you.   Nonspecific Chest Pain  Chest pain can be caused by many different conditions. There is always a chance that your pain could be related to something serious, such as a heart attack or a blood clot in your lungs. Chest pain can also be caused by conditions that are not life-threatening. If you have chest pain, it is very important to follow up with your health care provider. CAUSES  Chest pain can be caused by:  Heartburn.  Pneumonia or bronchitis.  Anxiety or stress.  Inflammation around your heart (pericarditis) or lung (pleuritis or pleurisy).  A blood clot in your lung.  A collapsed lung (pneumothorax). It can develop suddenly on its own (spontaneous pneumothorax) or from trauma to the chest.  Shingles infection (varicella-zoster virus).  Heart attack.  Damage to the bones, muscles, and cartilage that make up your chest wall. This can include:  Bruised bones due to injury.  Strained muscles or cartilage due to frequent or repeated coughing or overwork.  Fracture to one or more ribs.  Sore cartilage due to inflammation (costochondritis). RISK FACTORS  Risk factors for chest pain may include:  Activities that increase your risk for trauma or injury to your chest.  Respiratory infections or conditions that cause frequent coughing.  Medical conditions or overeating that can cause heartburn.  Heart disease or family history of heart disease.  Conditions or health behaviors that increase your risk of developing a blood clot.  Having had chicken pox (varicella zoster). SIGNS AND SYMPTOMS Chest  pain can feel like:  Burning or tingling on the surface of your chest or deep in your chest.  Crushing, pressure, aching, or squeezing pain.  Dull or sharp pain that is worse when you move, cough, or take a deep breath.  Pain that is also felt in your back, neck, shoulder, or arm, or pain that spreads to any of these areas. Your chest pain may come and go, or it may stay constant. DIAGNOSIS Lab tests or other studies may be needed to find the cause of your pain. Your health care provider may have you take a test called an ambulatory ECG (electrocardiogram). An ECG records your heartbeat patterns at the time the test is performed. You may also have other tests, such as:  Transthoracic echocardiogram (TTE). During echocardiography, sound waves are used to create a picture of all of the heart structures and to look at how blood flows through your heart.  Transesophageal echocardiogram (TEE).This is a more advanced imaging test that obtains images from inside your body. It allows your health care provider to see your heart in finer detail.  Cardiac monitoring. This allows your health care provider to monitor your heart rate and rhythm in real time.  Holter monitor. This is a portable device that records your heartbeat and can help to diagnose abnormal heartbeats. It allows your health care provider to track your heart activity for several days, if needed.  Stress tests. These can be done through exercise or by taking medicine that makes your heart beat more quickly.  Blood tests.  Imaging tests. TREATMENT  Your treatment depends on  what is causing your chest pain. Treatment may include:  Medicines. These may include:  Acid blockers for heartburn.  Anti-inflammatory medicine.  Pain medicine for inflammatory conditions.  Antibiotic medicine, if an infection is present.  Medicines to dissolve blood clots.  Medicines to treat coronary artery disease.  Supportive care for  conditions that do not require medicines. This may include:  Resting.  Applying heat or cold packs to injured areas.  Limiting activities until pain decreases. HOME CARE INSTRUCTIONS  If you were prescribed an antibiotic medicine, finish it all even if you start to feel better.  Avoid any activities that bring on chest pain.  Do not use any tobacco products, including cigarettes, chewing tobacco, or electronic cigarettes. If you need help quitting, ask your health care provider.  Do not drink alcohol.  Take medicines only as directed by your health care provider.  Keep all follow-up visits as directed by your health care provider. This is important. This includes any further testing if your chest pain does not go away.  If heartburn is the cause for your chest pain, you may be told to keep your head raised (elevated) while sleeping. This reduces the chance that acid will go from your stomach into your esophagus.  Make lifestyle changes as directed by your health care provider. These may include:  Getting regular exercise. Ask your health care provider to suggest some activities that are safe for you.  Eating a heart-healthy diet. A registered dietitian can help you to learn healthy eating options.  Maintaining a healthy weight.  Managing diabetes, if necessary.  Reducing stress. SEEK MEDICAL CARE IF:  Your chest pain does not go away after treatment.  You have a rash with blisters on your chest.  You have a fever. SEEK IMMEDIATE MEDICAL CARE IF:   Your chest pain is worse.  You have an increasing cough, or you cough up blood.  You have severe abdominal pain.  You have severe weakness.  You faint.  You have chills.  You have sudden, unexplained chest discomfort.  You have sudden, unexplained discomfort in your arms, back, neck, or jaw.  You have shortness of breath at any time.  You suddenly start to sweat, or your skin gets clammy.  You feel nauseous or  you vomit.  You suddenly feel light-headed or dizzy.  Your heart begins to beat quickly, or it feels like it is skipping beats. These symptoms may represent a serious problem that is an emergency. Do not wait to see if the symptoms will go away. Get medical help right away. Call your local emergency services (911 in the [place] Do not drive yourself to the hospital.   This information is not intended to replace advice given to you by your health care provider. Make sure you discuss any questions you have with your health care provider.   Document Released: 11/21/2004 Document Revised: 03/04/2014 Document Reviewed: 09/17/2013 Elsevier Interactive Patient Education [employer].

## 2015-08-17 NOTE — ED Notes (Signed)
Pt c/o non-radiating chest tightness that began yesterday. Pt wears 2L oxygen at home due to scarring in RT lobe from lung cancer. Pt denies any worsening SOB, productive cough, or fevers.

## 2015-08-17 NOTE — ED Provider Notes (Signed)
CSN: [id number]     Arrival date & time 08/17/15  1057 History   First MD Initiated Contact with Patient 08/17/15 1130     Chief Complaint  Patient presents with  . Chest Pain     (Consider location/radiation/quality/duration/timing/severity/associated sxs/prior Treatment) HPI 70 year old female who presents with chest tightness. History of COPD on 2L home oxygen and History of stage III squamous cell lung cancer, status post chemotherapy radiation, now under observation. Reports 3 days of constant chest tightness. Laying down makes it better. No fall, heavy lifting or exertional. Pain stable with activity such as walking, not worsened. No cough, congestion, sore throat. Reports baseline shortness of breath. More noticeable when she takes a deep breath in. No LE edema or pain. No prior history of PE or DVT.   Past Medical History  Diagnosis Date  . 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 Status post radiation therapy 10/1999  . GERD (gastroesophageal reflux disease)   . Hypothyroidism   . IBS (irritable bowel syndrome)     Hx: of  . COPD (chronic obstructive pulmonary disease) ([place])   . Shortness of breath     Hx: of with exertion  . Bronchitis     Hx: of  . Hay fever     Hx: of  . Seizures ([place])     Hx: of over 40 years ago  . H/O hiatal hernia   . History of radiation therapy 01/28/13-03/23/13    64 gray to right upper lobe  . 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 Lung cancer, upper lobe ([place]) dx'd 12/2012    chemo and XRt complete  . Anxiety    Past Surgical History  Procedure Laterality Date  . Laser ablation  04/2004    for VIN III  . Colonoscopy w/ biopsies and polypectomy      Hx: of  . Tubal ligation    . Video bronchoscopy with endobronchial navigation N/A 12/31/2012    Procedure: VIDEO BRONCHOSCOPY WITH ENDOBRONCHIAL NAVIGATION;  Surgeon: Melrose Nakayama, MD;  Location: [facility];  Service: Thoracic;  Laterality:  N/A;  . Video bronchoscopy with endobronchial ultrasound N/A 12/31/2012    Procedure: VIDEO BRONCHOSCOPY WITH ENDOBRONCHIAL ULTRASOUND;  Surgeon: Melrose Nakayama, MD;  Location: [facility];  Service: Thoracic;  Laterality: N/A;  . Cholecystectomy N/A 08/03/2013    Procedure: LAPAROSCOPIC CHOLECYSTECTOMY;  Surgeon: Harl Bowie, MD;  Location: [facility];  Service: General;  Laterality: N/A;   Family History  Problem Relation Age of Onset  . Heart disease Father   . Cervical cancer Sister   . Lung cancer Brother   . Heart disease Brother   . Cancer Brother     small cell lung cancer   Social History  Substance Use Topics  . Smoking status: Former Smoker -- 1.00 packs/day for 15 years    Quit date: 02/25/1998  . Smokeless tobacco: Never Used  . Alcohol Use: No   OB History    No data available     Review of Systems 10/14 systems reviewed and are negative other than those stated in the HPI    Allergies  Latex and Codeine  Home Medications   Prior to Admission medications   Medication Sig Start Date End Date Taking? Authorizing Provider  albuterol (PROVENTIL HFA;VENTOLIN HFA) 108 (90 BASE) MCG/ACT inhaler Inhale 1 puff into the lungs every 6 (six) hours as needed for wheezing.   Yes Historical  Provider, MD  albuterol (PROVENTIL) (2.5 MG/3ML) 0.083% nebulizer solution Take 3 mLs (2.5 mg total) by nebulization every 6 (six) hours as needed for wheezing or shortness of breath. 06/23/15  Yes Noralee Space, MD  ALPRAZolam [name]) 0.5 MG tablet Take 0.5 tablets (0.25 mg total) by mouth 2 (two) times daily as needed for anxiety. 07/20/15  Yes Gery Pray, MD  aspirin 325 MG tablet Take 325 mg by mouth once.   Yes Historical Provider, MD  Cholecalciferol (VITAMIN D-3) 5000 UNITS TABS Take 5,000 Units by mouth 2 (two) times a week.   Yes Historical Provider, MD  DEXILANT 60 MG capsule Take 60 mg by mouth daily as needed.  09/09/14  Yes Historical Provider, MD  gabapentin (NEURONTIN) 100 MG  capsule Take 1 capsule (100 mg total) by mouth 3 (three) times daily. Patient taking differently: Take 100 mg by mouth daily.  04/12/15  Yes Curt Bears, MD  HYDROcodone-acetaminophen (NORCO) 5-325 MG tablet Take 1 tablet by mouth every 6 (six) hours as needed. 07/20/15  Yes Gery Pray, MD  levothyroxine (SYNTHROID, LEVOTHROID) 112 MCG tablet Take 112 mcg by mouth daily before breakfast.   Yes Historical Provider, MD  predniSONE (DELTASONE) 10 MG tablet Take as directed Patient taking differently: Take 5 mg by mouth daily with breakfast. Take as directed 07/13/15  Yes Noralee Space, MD  fluticasone furoate-vilanterol (BREO ELLIPTA) 100-25 MCG/INH AEPB Inhale 1 puff into the lungs daily. Patient not taking: Reported on 07/20/2015 07/05/15   Noralee Space, MD  OXYGEN Inhale into the lungs. 3 lpm with exertion    Historical Provider, MD  umeclidinium bromide (INCRUSE ELLIPTA) 62.5 MCG/INH AEPB Inhale 1 puff into the lungs daily. Patient not taking: Reported on 07/20/2015 07/05/15   Noralee Space, MD   BP 116/66 mmHg  Pulse 71  Temp(Src) 97.5 F (36.4 C) (Oral)  Resp 23  Ht '5\' 6"'$  (1.676 m)  Wt 187 lb (84.823 kg)  BMI 30.20 kg/m2  SpO2 97% Physical Exam Physical Exam  Nursing note and vitals reviewed. Constitutional: Well developed, well nourished, non-toxic, and in no acute distress Head: Normocephalic and atraumatic.  Mouth/Throat: Oropharynx is clear and moist.  Neck: Normal range of motion. Neck supple.  Cardiovascular: Normal rate and regular rhythm.   Pulmonary/Chest: Effort normal and breath sounds normal. No wheezing rales or rhonchi. Abdominal: Soft. There is no tenderness. There is no rebound and no guarding.  Musculoskeletal: Normal range of motion.  Neurological: Alert, no facial droop, fluent speech, moves all extremities symmetrically Skin: Skin is warm and dry.  Psychiatric: Cooperative  ED Course  Procedures (including critical care time) Labs Review Labs Reviewed   CBC WITH DIFFERENTIAL/PLATELET - Abnormal; Notable for the following:    RDW 16.0 (*)    All other components within normal limits  BASIC METABOLIC PANEL - Abnormal; Notable for the following:    Glucose, Bld 125 (*)    Calcium 8.8 (*)    All other components within normal limits  D-DIMER, QUANTITATIVE (NOT AT [facility]) - Abnormal; Notable for the following:    D-Dimer, Quant 1.54 (*)    All other components within normal limits  TROPONIN I  TROPONIN I    Imaging Review Dg Chest 2 View  08/17/2015  CLINICAL DATA:  Patient with chest tightness and cough for 3 days. EXAM: CHEST  2 VIEW COMPARISON:  Chest radiograph 05/31/2015. FINDINGS: Right anterior chest wall Port-A-Cath is present with tip projecting over the superior vena cava. Stable  enlarged cardiac and mediastinal contours. Re- demonstrated postradiation changes right upper lobe. Left apical pleural parenchymal thickening. Eventration right hemidiaphragm. Thoracic spine degenerative changes. IMPRESSION: No acute cardiopulmonary process. Post radiation changes right upper lobe. Electronically Signed   By: Lovey Newcomer M.D.   On: 08/17/2015 12:30   Ct Angio Chest Pe W/cm &/or Wo Cm  08/17/2015  CLINICAL DATA:  Chest tightness which began yesterday EXAM: CT ANGIOGRAPHY CHEST WITH CONTRAST TECHNIQUE: Multidetector CT imaging of the chest was performed using the standard protocol during bolus administration of intravenous contrast. Multiplanar CT image reconstructions and MIPs were obtained to evaluate the vascular anatomy. CONTRAST:  100 cc of Isovue 370 COMPARISON:  2/ 8/17 FINDINGS: Mediastinum/Lymph Nodes: Normal heart size. There is no pericardial effusion identified. The trachea remains patent. Mild circumferential wall thickening of the mid esophagus at the level of the carina is identified. The right perihilar lung mass measures 2.8 x 2.5 x 2.6 cm, image number 61 of series 4. On the previous examination this mesh 2.5 x 1.8 x 0.6 cm. Right  hilar lymph node measures 11 mm, image 78 of series 4. Previously 6 mm. There is a left hilar lymph node which measures 9 mm, image 76 of series 4. Previously this measured the same. The main pulmonary artery appears patent. No lobar or segmental pulmonary artery filling defects identified. Right perihilar increased soft tissue encases and narrows the no focal filling defects identified to suggest acute pulmonary embolus. Right upper lobar pulmonary artery and right upper lobe bronchi. Lungs/Pleura: Increase in loculated pleural fluid and pleural nodularity involving the right lung. Index nodule within the right upper lung zone measures 8 mm, image 54 of series 6. Previously 7 mm. Advanced changes of centrilobular and paraseptal emphysema identified. IA mosaic attenuation pattern is noted throughout both lungs. Diffuse pulmonary parenchymal scarring is identified involving the right middle lobe and right upper lobe. Upper abdomen: The adrenal glands are unremarkable. The visualized portions of the liver in, spleen and pancreas are negative. The visualized portions of the pancreas and spleen and adrenal glands are normal. Musculoskeletal: No aggressive lytic or sclerotic bone lesions. Review of the MIP images confirms the above findings. IMPRESSION: 1. No evidence for acute pulmonary embolus. 2. Increased in right perihilar soft tissue and right hilar adenopathy. Suspicious for progression of disease. Additionally, there is increased and pleural thickening overlying the right lung. Findings may reflect pleural spread of tumor. 3. Advanced changes of emphysema. Electronically Signed   By: Kerby Moors M.D.   On: 08/17/2015 15:47   I have personally reviewed and evaluated these images and lab results as part of my medical decision-making.   EKG Interpretation None      MDM   Final diagnoses:  Chest tightness  Hilar adenopathy    70 year old female who presents with ongoing chest tightness over the  past 3 days. She is well-appearing with normal vital signs. On home 2 L oxygen with normal work of breathing. Lung exam is clear. Presentation atypical for that of ACS, she is low risk with heart score of 3. Serial troponins are negative and serial EKGs without dynamic changes. Pain also seems atypical for that of PE, but given her recent history of lung cancer d-dimer was sent for screening. This is positive and she underwent CT angio of the chest. There is no evidence of PE or infiltrate. She does have right hilar adenopathy with pleural thickening that is concerning for potential progression of her tumor. I have discussed  these findings with the patient, who will follow up closely with her oncologist who is currently observing her to discuss need for further work-up. Strict return and follow-up instructions reviewed. She expressed understanding of all discharge instructions and felt comfortable with the plan of care.     Forde Dandy, MD 08/17/15 [phone]

## 2015-08-18 ENCOUNTER — Telehealth: Payer: Self-pay | Admitting: Internal Medicine

## 2015-08-18 NOTE — Telephone Encounter (Signed)
pt called to sched appt...done..pt ok and aware to see pt

## 2015-08-24 ENCOUNTER — Ambulatory Visit (HOSPITAL_BASED_OUTPATIENT_CLINIC_OR_DEPARTMENT_OTHER): Payer: Medicare Other | Admitting: Internal Medicine

## 2015-08-24 ENCOUNTER — Telehealth: Payer: Self-pay | Admitting: Internal Medicine

## 2015-08-24 ENCOUNTER — Encounter: Payer: Self-pay | Admitting: Internal Medicine

## 2015-08-24 VITALS — BP 133/52 | HR 70 | Temp 98.0°F | Resp 19 | Ht 66.0 in | Wt 197.5 lb

## 2015-08-24 DIAGNOSIS — Z85118 Personal history of other malignant neoplasm of bronchus and lung: Secondary | ICD-10-CM | POA: Diagnosis not present

## 2015-08-24 DIAGNOSIS — R0602 Shortness of breath: Secondary | ICD-10-CM

## 2015-08-24 DIAGNOSIS — R05 Cough: Secondary | ICD-10-CM | POA: Diagnosis not present

## 2015-08-24 DIAGNOSIS — C3411 Malignant neoplasm of upper lobe, right bronchus or lung: Secondary | ICD-10-CM

## 2015-08-24 MED ORDER — LEVOFLOXACIN 500 MG PO TABS: 500.0000 mg | ORAL_TABLET | Freq: Every day | ORAL | Status: DC

## 2015-08-24 NOTE — Progress Notes (Signed)
[place] [phone]   [phone]) [phone]  OFFICE PROGRESS NOTE  ROSS,ALLeN, MD [place] [id number]  DIAGNOSIS: Stage IIIA (T1b., N2, M0) non-small cell lung cancer, squamous cell carcinoma presented with right upper lobe lung nodule in addition to mediastinal lymphadenopathy diagnosed in November of 2014.  PRIOR THERAPY:  1) Concurrent chemoradiation with weekly carboplatin for AUC of 2 and paclitaxel 45 mg/M2, status post 8 cycles, last dose was given 03/15/2013 with partial response. 2) Consolidation chemotherapy with carboplatin for AUC of 5 and paclitaxel 175 mg/M2 every 3 weeks with Neulasta support, status post 3 cycles. First dose 05/03/2013. Last dose was given 06/14/2013.  CURRENT THERAPY: Observation.  CHEMOTHERAPY INTENT: Control/curative  CURRENT # OF CHEMOTHERAPY CYCLES: 0  CURRENT ANTIEMETICS: Zofran, dexamethasone and Compazine  CURRENT SMOKING STATUS: Former smoker.  ORAL CHEMOTHERAPY AND CONSENT: None  CURRENT BISPHOSPHONATES USE: None  PAIN MANAGEMENT: 0/10  NARCOTICS INDUCED CONSTIPATION: None  LIVING WILL AND CODE STATUS: Full code   INTERVAL HISTORY: Dawn Mercer 70 y.o. female returns to the clinic today for followup visit accompanied by her husband. She has been on observation for more than 2 years with no significant complaints except recently she had central chest pressure as well as shortness breath and mild cough productive of yellowish sputum. She had cardiac workup performed at the emergency department at [facility] that was unremarkable. During her evaluation she had repeat CT angiogram of the chest performed which was concerning for some disease progression. She is here today for evaluation and recommendation regarding her condition. She denied having any nausea or vomiting. She has no fever or chills. She has no significant weight loss or night sweats.   MEDICAL  HISTORY: Past Medical History  Diagnosis Date  . 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 Status post radiation therapy 10/1999  . GERD (gastroesophageal reflux disease)   . Hypothyroidism   . IBS (irritable bowel syndrome)     Hx: of  . COPD (chronic obstructive pulmonary disease) ([place])   . Shortness of breath     Hx: of with exertion  . Bronchitis     Hx: of  . Hay fever     Hx: of  . Seizures ([place])     Hx: of over 40 years ago  . H/O hiatal hernia   . History of radiation therapy 01/28/13-03/23/13    63 gray to right upper lobe  . 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 Lung cancer, upper lobe ([place]) dx'd 12/2012    chemo and XRt complete  . Anxiety     ALLERGIES:  is allergic to latex and codeine.  MEDICATIONS:  Current Outpatient Prescriptions  Medication Sig Dispense Refill  . albuterol (PROVENTIL HFA;VENTOLIN HFA) 108 (90 BASE) MCG/ACT inhaler Inhale 1 puff into the lungs every 6 (six) hours as needed for wheezing.    Marland Kitchen albuterol (PROVENTIL) (2.5 MG/3ML) 0.083% nebulizer solution Take 3 mLs (2.5 mg total) by nebulization every 6 (six) hours as needed for wheezing or shortness of breath. 150 mL 11  . ALPRAZolam (XANAX) 0.5 MG tablet Take 0.5 tablets (0.25 mg total) by mouth 2 (two) times daily as needed for anxiety. 90 tablet 0  . aspirin 325 MG tablet Take 325 mg by mouth once.    . Cholecalciferol (VITAMIN D-3) 5000 UNITS TABS Take 5,000 Units by mouth 2 (two) times a week.    Marland Kitchen  gabapentin (NEURONTIN) 100 MG capsule Take 1 capsule (100 mg total) by mouth 3 (three) times daily. (Patient taking differently: Take 100 mg by mouth daily. ) 270 capsule 1  . HYDROcodone-acetaminophen (NORCO) 5-325 MG tablet Take 1 tablet by mouth every 6 (six) hours as needed. 150 tablet 0  . levothyroxine (SYNTHROID, LEVOTHROID) 112 MCG tablet Take 112 mcg by mouth daily before breakfast.    . OXYGEN Inhale into the lungs. 3 lpm with exertion    .  predniSONE (DELTASONE) 10 MG tablet Take as directed (Patient taking differently: Take 5 mg by mouth daily with breakfast. Take as directed) 45 tablet 0   No current facility-administered medications for this visit.    SURGICAL HISTORY:  Past Surgical History  Procedure Laterality Date  . Laser ablation  04/2004    for VIN III  . Colonoscopy w/ biopsies and polypectomy      Hx: of  . Tubal ligation    . Video bronchoscopy with endobronchial navigation N/A 12/31/2012    Procedure: VIDEO BRONCHOSCOPY WITH ENDOBRONCHIAL NAVIGATION;  Surgeon: Melrose Nakayama, MD;  Location: [place];  Service: Thoracic;  Laterality: N/A;  . Video bronchoscopy with endobronchial ultrasound N/A 12/31/2012    Procedure: VIDEO BRONCHOSCOPY WITH ENDOBRONCHIAL ULTRASOUND;  Surgeon: Melrose Nakayama, MD;  Location: [place];  Service: Thoracic;  Laterality: N/A;  . Cholecystectomy N/A 08/03/2013    Procedure: LAPAROSCOPIC CHOLECYSTECTOMY;  Surgeon: Harl Bowie, MD;  Location: [place];  Service: General;  Laterality: N/A;    REVIEW OF SYSTEMS:  A comprehensive review of systems was negative except for: Respiratory: positive for cough, dyspnea on exertion, pleurisy/chest pain and sputum   PHYSICAL EXAMINATION: General appearance: alert, cooperative and no distress Head: Normocephalic, without obvious abnormality, atraumatic Neck: no adenopathy, no JVD, supple, symmetrical, trachea midline and thyroid not enlarged, symmetric, no tenderness/mass/nodules Lymph nodes: Cervical, supraclavicular, and axillary nodes normal. Resp: clear to auscultation bilaterally Back: symmetric, no curvature. ROM normal. No CVA tenderness. Cardio: regular rate and rhythm, S1, S2 normal, no murmur, click, rub or gallop GI: soft, non-tender; bowel sounds normal; no masses,  no organomegaly Extremities: extremities normal, atraumatic, no cyanosis or edema Neurologic: Alert and oriented X 3, normal strength and tone. Normal symmetric  reflexes. Normal coordination and gait  ECOG PERFORMANCE STATUS: 1 - Symptomatic but completely ambulatory  Blood pressure 133/52, pulse 70, temperature 98 F (36.7 C), temperature source Oral, resp. rate 19, height '5\' 6"'$  (1.676 m), weight 197 lb 8 oz (89.585 kg), SpO2 98 %.  LABORATORY DATA: Lab Results  Component Value Date   WBC 7.6 08/17/2015   HGB 13.7 08/17/2015   HCT 42.7 08/17/2015   MCV 90.5 08/17/2015   PLT 224 08/17/2015      Chemistry      Component Value Date/Time   NA 138 08/17/2015 1110   NA 139 04/05/2015 0935   K 3.9 08/17/2015 1110   K 4.2 04/05/2015 0935   CL 105 08/17/2015 1110   CO2 25 08/17/2015 1110   CO2 25 04/05/2015 0935   BUN 8 08/17/2015 1110   BUN 11.9 04/05/2015 0935   CREATININE 0.80 08/17/2015 1110   CREATININE 0.8 04/05/2015 0935      Component Value Date/Time   CALCIUM 8.8* 08/17/2015 1110   CALCIUM 9.3 04/05/2015 0935   ALKPHOS 111 04/05/2015 0935   ALKPHOS 107 10/04/2013 1051   AST 15 04/05/2015 0935   AST 16 10/04/2013 1051   ALT 11 04/05/2015 0935  ALT 9 10/04/2013 1051   BILITOT 0.97 04/05/2015 0935   BILITOT 0.6 10/04/2013 1051       RADIOGRAPHIC STUDIES: Dg Chest 2 View  08/17/2015  CLINICAL DATA:  Patient with chest tightness and cough for 3 days. EXAM: CHEST  2 VIEW COMPARISON:  Chest radiograph 05/31/2015. FINDINGS: Right anterior chest wall Port-A-Cath is present with tip projecting over the superior vena cava. Stable enlarged cardiac and mediastinal contours. Re- demonstrated postradiation changes right upper lobe. Left apical pleural parenchymal thickening. Eventration right hemidiaphragm. Thoracic spine degenerative changes. IMPRESSION: No acute cardiopulmonary process. Post radiation changes right upper lobe. Electronically Signed   By: Lovey Newcomer M.D.   On: 08/17/2015 12:30   Ct Angio Chest Pe W/cm &/or Wo Cm  08/17/2015  CLINICAL DATA:  Chest tightness which began yesterday EXAM: CT ANGIOGRAPHY CHEST WITH  CONTRAST TECHNIQUE: Multidetector CT imaging of the chest was performed using the standard protocol during bolus administration of intravenous contrast. Multiplanar CT image reconstructions and MIPs were obtained to evaluate the vascular anatomy. CONTRAST:  100 cc of Isovue 370 COMPARISON:  2/ 8/17 FINDINGS: Mediastinum/Lymph Nodes: Normal heart size. There is no pericardial effusion identified. The trachea remains patent. Mild circumferential wall thickening of the mid esophagus at the level of the carina is identified. The right perihilar lung mass measures 2.8 x 2.5 x 2.6 cm, image number 61 of series 4. On the previous examination this mesh 2.5 x 1.8 x 0.6 cm. Right hilar lymph node measures 11 mm, image 78 of series 4. Previously 6 mm. There is a left hilar lymph node which measures 9 mm, image 76 of series 4. Previously this measured the same. The main pulmonary artery appears patent. No lobar or segmental pulmonary artery filling defects identified. Right perihilar increased soft tissue encases and narrows the no focal filling defects identified to suggest acute pulmonary embolus. Right upper lobar pulmonary artery and right upper lobe bronchi. Lungs/Pleura: Increase in loculated pleural fluid and pleural nodularity involving the right lung. Index nodule within the right upper lung zone measures 8 mm, image 54 of series 6. Previously 7 mm. Advanced changes of centrilobular and paraseptal emphysema identified. IA mosaic attenuation pattern is noted throughout both lungs. Diffuse pulmonary parenchymal scarring is identified involving the right middle lobe and right upper lobe. Upper abdomen: The adrenal glands are unremarkable. The visualized portions of the liver in, spleen and pancreas are negative. The visualized portions of the pancreas and spleen and adrenal glands are normal. Musculoskeletal: No aggressive lytic or sclerotic bone lesions. Review of the MIP images confirms the above findings. IMPRESSION:  1. No evidence for acute pulmonary embolus. 2. Increased in right perihilar soft tissue and right hilar adenopathy. Suspicious for progression of disease. Additionally, there is increased and pleural thickening overlying the right lung. Findings may reflect pleural spread of tumor. 3. Advanced changes of emphysema. Electronically Signed   By: Kerby Moors M.D.   On: 08/17/2015 15:47   ASSESSMENT AND PLAN: This is a very pleasant 70 years old white female recently diagnosed with a stage IIIA non-small cell lung cancer currently undergoing concurrent chemoradiation with weekly carboplatin and paclitaxel status post 8 weekly treatment followed by consolidation chemotherapy with carboplatin and paclitaxel status post 3 cycles.  She has been on observation for more than 2 yearsl. She has been complaining of chest pressure as well as shortness breath and cough productive of yellowish sputum recently. CT angiogram of the chest performed on 08/17/2015 showed increase in the  right perihilar soft tissue and right hilar adenopathy suspicious for disease progression. I personally reviewed the images and discussed the scan results with the patient and her husband. I recommended for her to have a PET scan for further evaluation of her disease and to rule out any disease recurrence or progression. In the meantime, I will start the patient on Levaquin 500 mg by mouth daily for the next 7 days to treat any inflammatory process. She will continue to have Port-A-Cath flush every 2 months.  The patient would come back for follow-up visit in 3 weeks for reevaluation and discussion of the PET scan results and further recommendation regarding her condition. She was advised to call immediately if she has any concerning symptoms in the interval. The patient voices understanding of current disease status and treatment options and is in agreement with the current care plan.  All questions were answered. The patient knows to  call the clinic with any problems, questions or concerns. We can certainly see the patient much sooner if necessary.  Disclaimer: This note was dictated with voice recognition software. Similar sounding words can inadvertently be transcribed and may not be corrected upon review.

## 2015-08-24 NOTE — Telephone Encounter (Signed)
per pof to sch pt appt-gave pt copy of avs °

## 2015-08-30 ENCOUNTER — Telehealth: Payer: Self-pay | Admitting: *Deleted

## 2015-08-30 NOTE — Telephone Encounter (Signed)
TC from patient stating that hthe pain she has been experiencing in her mid chest and mid back continues as does the shortness of breath. Patient saw Dr. Julien Nordmann on 08/24/15 with the same symptoms. Pt is asking for pain medication. Reviewed medication list. Pt does have hydrocodone/apap 5/325 available at home. Discussed this with her. She has only been taking 1/2 tablet once or twice a day, no more than that. Educated patient on taking the pain medication as ordered: 1 tablet every 6 hours as needed and optimizing that medication before changing to something else. Pt voiced understanding and will take one tablet  When she gets done with this phone call. Also pt has 02 available at home but only uses it occasionally at night. Advised pt she could use it during the day if needed. Pt voiced understanding. Also advised pt that she should go to ED if patient becomes more short of breath.  Advised to call back in the morning if pain is unchanged.  Again pt voiced understanding.  Pt asked about the scheduling of her PET scan. She has not heard from a scheduler about that.

## 2015-09-05 ENCOUNTER — Other Ambulatory Visit: Payer: Self-pay | Admitting: Medical Oncology

## 2015-09-05 ENCOUNTER — Telehealth: Payer: Self-pay | Admitting: Medical Oncology

## 2015-09-05 ENCOUNTER — Other Ambulatory Visit: Payer: Self-pay | Admitting: Internal Medicine

## 2015-09-05 DIAGNOSIS — C3411 Malignant neoplasm of upper lobe, right bronchus or lung: Secondary | ICD-10-CM

## 2015-09-05 NOTE — Telephone Encounter (Signed)
Pt concerned of claustrophobia for PET scan . I instructed pt to bring her xanax with her and staff will show her scanner and she can decide during the hour prep if she wants to take her xanax

## 2015-09-06 ENCOUNTER — Ambulatory Visit (HOSPITAL_COMMUNITY)
Admission: RE | Admit: 2015-09-06 | Discharge: 2015-09-06 | Disposition: A | Discharge status: Home / Self Care | Payer: Medicare Other | Source: Ambulatory Visit | Attending: Internal Medicine | Admitting: Internal Medicine

## 2015-09-06 DIAGNOSIS — R937 Abnormal findings on diagnostic imaging of other parts of musculoskeletal system: Secondary | ICD-10-CM | POA: Insufficient documentation

## 2015-09-06 DIAGNOSIS — C3411 Malignant neoplasm of upper lobe, right bronchus or lung: Secondary | ICD-10-CM | POA: Insufficient documentation

## 2015-09-06 DIAGNOSIS — Z923 Personal history of irradiation: Secondary | ICD-10-CM | POA: Diagnosis not present

## 2015-09-06 LAB — GLUCOSE, CAPILLARY: GLUCOSE-CAPILLARY: 96 mg/dL (ref 65–99)

## 2015-09-06 MED ORDER — FLUDEOXYGLUCOSE F - 18 (FDG) INJECTION
10.4900 | Freq: Once | INTRAVENOUS | Status: AC | PRN
  Administered 2015-09-06: 10.49 via INTRAVENOUS

## 2015-09-13 ENCOUNTER — Telehealth: Payer: Self-pay | Admitting: Medical Oncology

## 2015-09-13 NOTE — Telephone Encounter (Signed)
Doesn't look like her pain is cancer related. She may need to check with her primary care physician. She can use ibuprofen as needed.

## 2015-09-13 NOTE — Telephone Encounter (Signed)
Asking about results of PET scan and needs medication for back and chest pain. She is taking hydrocodone. 1 every 6 hours prn but not effective. Heat does help some but not all the time.

## 2015-09-14 NOTE — Telephone Encounter (Signed)
Pt.notified

## 2015-09-18 ENCOUNTER — Ambulatory Visit (HOSPITAL_BASED_OUTPATIENT_CLINIC_OR_DEPARTMENT_OTHER): Payer: Medicare Other

## 2015-09-18 ENCOUNTER — Telehealth: Payer: Self-pay | Admitting: Internal Medicine

## 2015-09-18 ENCOUNTER — Ambulatory Visit (HOSPITAL_BASED_OUTPATIENT_CLINIC_OR_DEPARTMENT_OTHER): Payer: Medicare Other | Admitting: Internal Medicine

## 2015-09-18 ENCOUNTER — Encounter: Payer: Self-pay | Admitting: Internal Medicine

## 2015-09-18 VITALS — BP 122/71 | HR 100 | Temp 97.5°F | Resp 20 | Ht 66.0 in | Wt 198.3 lb

## 2015-09-18 DIAGNOSIS — Z452 Encounter for adjustment and management of vascular access device: Secondary | ICD-10-CM

## 2015-09-18 DIAGNOSIS — Z85118 Personal history of other malignant neoplasm of bronchus and lung: Secondary | ICD-10-CM

## 2015-09-18 DIAGNOSIS — Z95828 Presence of other vascular implants and grafts: Secondary | ICD-10-CM

## 2015-09-18 DIAGNOSIS — M899 Disorder of bone, unspecified: Secondary | ICD-10-CM

## 2015-09-18 DIAGNOSIS — C3411 Malignant neoplasm of upper lobe, right bronchus or lung: Secondary | ICD-10-CM

## 2015-09-18 MED ORDER — SODIUM CHLORIDE 0.9% FLUSH
10.0000 mL | INTRAVENOUS | Status: DC | PRN
  Administered 2015-09-18: 10 mL via INTRAVENOUS
  Filled 2015-09-18: qty 10

## 2015-09-18 MED ORDER — HEPARIN SOD (PORK) LOCK FLUSH 100 UNIT/ML IV SOLN
500.0000 [IU] | Freq: Once | INTRAVENOUS | Status: AC
  Administered 2015-09-18: 500 [IU] via INTRAVENOUS
  Filled 2015-09-18: qty 5

## 2015-09-18 NOTE — Telephone Encounter (Signed)
Gave pt cal & avs °

## 2015-09-18 NOTE — Progress Notes (Signed)
[place] [phone]   [phone]  OFFICE PROGRESS NOTE  ROSS,ALLeN, MD [place] [address]  DIAGNOSIS: Stage IIIA (T1b., N2, M0) non-small cell lung cancer, squamous cell carcinoma presented with right upper lobe lung nodule in addition to mediastinal lymphadenopathy diagnosed in November of 2014.  PRIOR THERAPY:  1) Concurrent chemoradiation with weekly carboplatin for AUC of 2 and paclitaxel 45 mg/M2, status post 8 cycles, last dose was given 03/15/2013 with partial response. 2) Consolidation chemotherapy with carboplatin for AUC of 5 and paclitaxel 175 mg/M2 every 3 weeks with Neulasta support, status post 3 cycles. First dose 05/03/2013. Last dose was given 06/14/2013.  CURRENT THERAPY: Observation.  CHEMOTHERAPY INTENT: Control/curative  CURRENT # OF CHEMOTHERAPY CYCLES: 0  CURRENT ANTIEMETICS: Zofran, dexamethasone and Compazine  CURRENT SMOKING STATUS: Former smoker.  ORAL CHEMOTHERAPY AND CONSENT: None  CURRENT BISPHOSPHONATES USE: None  PAIN MANAGEMENT: 0/10  NARCOTICS INDUCED CONSTIPATION: None  LIVING WILL AND CODE STATUS: Full code   INTERVAL HISTORY: Dawn Mercer 70 y.o. female returns to the clinic today for followup visit accompanied by her husband. She continues to complain of increasing pain on the chest and upper back. She is currently on Vicodin for pain management. She has shortness of breath and currently on home oxygen. She continues to have mild cough with no hemoptysis. She denied having any nausea or vomiting. She has no fever or chills. She has no significant weight loss or night sweats. She had a PET scan performed recently for evaluation of her condition and to rule out disease recurrence and the patient is here today for evaluation and discussion of her PET scan results.  MEDICAL HISTORY: Past Medical History:  Diagnosis Date  . Anxiety   . Bronchitis    Hx: of    . 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 COPD (chronic obstructive pulmonary disease) ([place])   . GERD (gastroesophageal reflux disease)   . H/O hiatal hernia   . Hay fever    Hx: of  . History of radiation therapy 01/28/13-03/23/13   63 gray to right upper lobe  . Hypothyroidism   . IBS (irritable bowel syndrome)    Hx: of  . Lung cancer, upper lobe ([place]) dx'd 12/2012   chemo and XRt complete  . Seizures ([place] [facility])    Hx: of over 40 years ago  . Shortness of breath    Hx: of with exertion  . Status post radiation therapy 10/1999  . 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  MEDICATIONS:  Current Outpatient Prescriptions  Medication Sig Dispense Refill  . albuterol (PROVENTIL HFA;VENTOLIN HFA) 108 (90 BASE) MCG/ACT inhaler Inhale 1 puff into the lungs every 6 (six) hours as needed for wheezing.    Marland Kitchen albuterol (PROVENTIL) (2.5 MG/3ML) 0.083% nebulizer solution Take 3 mLs (2.5 mg total) by nebulization every 6 (six) hours as needed for wheezing or shortness of breath. 150 mL 11  . ALPRAZolam (XANAX) 0.5 MG tablet Take 0.5 tablets (0.25 mg total) by mouth 2 (two) times daily as needed for anxiety. 90 tablet 0  . aspirin 325 MG tablet Take 325 mg by mouth once.    . Cholecalciferol (VITAMIN D-3) 5000 UNITS TABS Take 5,000 Units by mouth 2 (two) times a week.    . gabapentin (NEURONTIN) 100 MG capsule Take 1 capsule (100 mg  total) by mouth 3 (three) times daily. (Patient taking differently: Take 100 mg by mouth daily. ) 270 capsule 1  . HYDROcodone-acetaminophen (NORCO) 5-325 MG tablet Take 1 tablet by mouth every 6 (six) hours as needed. 150 tablet 0  . levofloxacin (LEVAQUIN) 500 MG tablet Take 1 tablet (500 mg total) by mouth daily. 7 tablet 0  . levothyroxine (SYNTHROID, LEVOTHROID) 112 MCG tablet Take 112 mcg by mouth daily before breakfast.    . OXYGEN Inhale into the lungs. 3 lpm with exertion    .  predniSONE (DELTASONE) 10 MG tablet Take as directed (Patient taking differently: Take 5 mg by mouth daily with breakfast. Take as directed) 45 tablet 0   No current facility-administered medications for this visit.     SURGICAL HISTORY:  Past Surgical History:  Procedure Laterality Date  . CHOLECYSTECTOMY N/A 08/03/2013   Procedure: LAPAROSCOPIC CHOLECYSTECTOMY;  Surgeon: Harl Bowie, MD;  Location: [facility];  Service: General;  Laterality: N/A;  . COLONOSCOPY W/ BIOPSIES AND POLYPECTOMY     Hx: of  . LASER ABLATION  04/2004   for VIN III  . TUBAL LIGATION    . VIDEO BRONCHOSCOPY WITH ENDOBRONCHIAL NAVIGATION N/A 12/31/2012   Procedure: VIDEO BRONCHOSCOPY WITH ENDOBRONCHIAL NAVIGATION;  Surgeon: Melrose Nakayama, MD;  Location: [facility];  Service: Thoracic;  Laterality: N/A;  . VIDEO BRONCHOSCOPY WITH ENDOBRONCHIAL ULTRASOUND N/A 12/31/2012   Procedure: VIDEO BRONCHOSCOPY WITH ENDOBRONCHIAL ULTRASOUND;  Surgeon: Melrose Nakayama, MD;  Location: [facility];  Service: Thoracic;  Laterality: N/A;    REVIEW OF SYSTEMS:  A comprehensive review of systems was negative except for: Constitutional: positive for fatigue Respiratory: positive for cough, dyspnea on exertion, pleurisy/chest pain and sputum Musculoskeletal: positive for back pain   PHYSICAL EXAMINATION: General appearance: alert, cooperative and no distress Head: Normocephalic, without obvious abnormality, atraumatic Neck: no adenopathy, no JVD, supple, symmetrical, trachea midline and thyroid not enlarged, symmetric, no tenderness/mass/nodules Lymph nodes: Cervical, supraclavicular, and axillary nodes normal. Resp: clear to auscultation bilaterally Back: symmetric, no curvature. ROM normal. No CVA tenderness. Cardio: regular rate and rhythm, S1, S2 normal, no murmur, click, rub or gallop GI: soft, non-tender; bowel sounds normal; no masses,  no organomegaly Extremities: extremities normal, atraumatic, no cyanosis or  edema Neurologic: Alert and oriented X 3, normal strength and tone. Normal symmetric reflexes. Normal coordination and gait  ECOG PERFORMANCE STATUS: 1 - Symptomatic but completely ambulatory  Blood pressure 122/71, pulse 100, temperature 97.5 F (36.4 C), temperature source Oral, resp. rate 20, height '5\' 6"'$  (1.676 m), weight 89.9 kg (198 lb 4.8 oz), SpO2 95 %.  LABORATORY DATA: Lab Results  Component Value Date   WBC 7.6 08/17/2015   HGB 13.7 08/17/2015   HCT 42.7 08/17/2015   MCV 90.5 08/17/2015   PLT 224 08/17/2015      Chemistry      Component Value Date/Time   NA 138 08/17/2015 1110   NA 139 04/05/2015 0935   K 3.9 08/17/2015 1110   K 4.2 04/05/2015 0935   CL 105 08/17/2015 1110   CO2 25 08/17/2015 1110   CO2 25 04/05/2015 0935   BUN 8 08/17/2015 1110   BUN 11.9 04/05/2015 0935   CREATININE 0.80 08/17/2015 1110   CREATININE 0.8 04/05/2015 0935      Component Value Date/Time   CALCIUM 8.8 (L) 08/17/2015 1110   CALCIUM 9.3 04/05/2015 0935   ALKPHOS 111 04/05/2015 0935   AST 15 04/05/2015 0935   ALT 11 04/05/2015  0935   BILITOT 0.97 04/05/2015 0935       RADIOGRAPHIC STUDIES: Nm Pet Image Restag (ps) Skull Base To Thigh  Result Date: 09/06/2015 CLINICAL DATA:  Subsequent treatment strategy for lung carcinoma. RIGHT upper lobe carcinoma. EXAM: NUCLEAR MEDICINE PET SKULL BASE TO THIGH TECHNIQUE: 10.5 mCi F-18 FDG was injected intravenously. Full-ring PET imaging was performed from the skull base to thigh after the radiotracer. CT data was obtained and used for attenuation correction and anatomic localization. FASTING BLOOD GLUCOSE:  Value: 96 mg/dl COMPARISON:  PET-CT 08/20/2013 FINDINGS: NECK No hypermetabolic lymph nodes in the neck. CHEST RIGHT perihilar thickening with moderate metabolic activity (SUV max equal 4.7) but no focality. There is similar activity within the anterior RIGHT chest wall. Favored post radiation change. There is 1 focus of pleural thickening  in the RIGHT lower lobe on image 60 of the fused data set. Peripheral nodularity in the RIGHT upper lobe without significant metabolic activity. LEFT lung is relatively clear. ABDOMEN/PELVIS Small LEFT adrenal adenoma. There are no abnormal metabolic activity in the liver or spleen. No hypermetabolic abdominal pelvic lymph nodes. Atherosclerotic calcification aorta. SKELETON There is focal activity within the mid thoracic spine localizing to the T5 vertebral body. This is within the field of marrow photopenia as seen on comparison exam. Activity is moderate with SUV max equals 6.8. There is no lesion identified on comparison CT of 08/17/2015. IMPRESSION: 1. No clear evidence of local recurrence in the RIGHT hilum at site of postradiation thickening. 2. Single focus of hypermetabolic pleural thickening in the RIGHT lower lobe. Recommend attention on follow-up with short interval CT of the thorax contrast (3 months). 3. Increased activity within the T5 vertebral body within a field of photopenia from prior radiation. Differential would include occult fracture (potentially from radiation) versus skeletal metastasis. No lesion identified on comparison CT of 08/17/2015. Consider MRI of the thoracic spine for further characterization. Electronically Signed   By: Suzy Bouchard M.D.   On: 09/06/2015 18:55   ASSESSMENT AND PLAN: This is a very pleasant 70 years old white female recently diagnosed with a stage IIIA non-small cell lung cancer currently undergoing concurrent chemoradiation with weekly carboplatin and paclitaxel status post 8 weekly treatment followed by consolidation chemotherapy with carboplatin and paclitaxel status post 3 cycles.  She has been on observation for more than 2 yearsl. She has been complaining of chest pressure as well as shortness breath and cough productive of yellowish sputum recently. CT angiogram of the chest performed on 08/17/2015 showed increase in the right perihilar soft tissue  and right hilar adenopathy suspicious for disease progression. A PET scan performed recently showed no clear evidence for disease recurrence but there was questionable lesion at T5 vertebral body concerning for osteopenia from prior radiation versus bone metastasis. I discussed the PET scan results and showed the images to the patient and her husband. I recommended for her to have MRI of the thoracic spine for further evaluation of this lesion. If there is any concern for brain metastasis, I will refer the patient to Dr. Sondra Come for consideration of palliative radiotherapy. The patient would come back for follow-up visit in 2 weeks for reevaluation and discussion of the MRI results and further recommendation regarding her condition. She was advised to call immediately if she has any concerning symptoms in the interval. The patient voices understanding of current disease status and treatment options and is in agreement with the current care plan.  All questions were answered. The patient knows  to call the clinic with any problems, questions or concerns. We can certainly see the patient much sooner if necessary.  Disclaimer: This note was dictated with voice recognition software. Similar sounding words can inadvertently be transcribed and may not be corrected upon review.

## 2015-09-25 ENCOUNTER — Ambulatory Visit (HOSPITAL_COMMUNITY)
Admission: RE | Admit: 2015-09-25 | Discharge: 2015-09-25 | Disposition: A | Discharge status: Home / Self Care | Payer: Medicare Other | Source: Ambulatory Visit | Attending: Internal Medicine | Admitting: Internal Medicine

## 2015-09-25 DIAGNOSIS — C3411 Malignant neoplasm of upper lobe, right bronchus or lung: Secondary | ICD-10-CM | POA: Insufficient documentation

## 2015-09-25 DIAGNOSIS — M4854XA Collapsed vertebra, not elsewhere classified, thoracic region, initial encounter for fracture: Secondary | ICD-10-CM | POA: Insufficient documentation

## 2015-09-25 MED ORDER — GADOBENATE DIMEGLUMINE 529 MG/ML IV SOLN
20.0000 mL | Freq: Once | INTRAVENOUS | Status: AC | PRN
  Administered 2015-09-25: 20 mL via INTRAVENOUS

## 2015-10-02 ENCOUNTER — Other Ambulatory Visit: Payer: Self-pay | Admitting: Medical Oncology

## 2015-10-02 ENCOUNTER — Ambulatory Visit (HOSPITAL_BASED_OUTPATIENT_CLINIC_OR_DEPARTMENT_OTHER): Payer: Medicare Other | Admitting: Internal Medicine

## 2015-10-02 ENCOUNTER — Telehealth: Payer: Self-pay | Admitting: Internal Medicine

## 2015-10-02 ENCOUNTER — Encounter: Payer: Self-pay | Admitting: Internal Medicine

## 2015-10-02 VITALS — BP 131/63 | HR 98 | Temp 98.4°F | Resp 18 | Ht 66.0 in | Wt 197.6 lb

## 2015-10-02 DIAGNOSIS — C3411 Malignant neoplasm of upper lobe, right bronchus or lung: Secondary | ICD-10-CM

## 2015-10-02 DIAGNOSIS — Z85118 Personal history of other malignant neoplasm of bronchus and lung: Secondary | ICD-10-CM

## 2015-10-02 DIAGNOSIS — J9611 Chronic respiratory failure with hypoxia: Secondary | ICD-10-CM

## 2015-10-02 NOTE — Progress Notes (Signed)
[place] [phone]   Fax:(336) [phone]  OFFICE PROGRESS NOTE  ROSS,ALLeN, MD [place] [address]  DIAGNOSIS: Stage IIIA (T1b., N2, M0) non-small cell lung cancer, squamous cell carcinoma presented with right upper lobe lung nodule in addition to mediastinal lymphadenopathy diagnosed in November of 2014.  PRIOR THERAPY:  1) Concurrent chemoradiation with weekly carboplatin for AUC of 2 and paclitaxel 45 mg/M2, status post 8 cycles, last dose was given 03/15/2013 with partial response. 2) Consolidation chemotherapy with carboplatin for AUC of 5 and paclitaxel 175 mg/M2 every 3 weeks with Neulasta support, status post 3 cycles. First dose 05/03/2013. Last dose was given 06/14/2013.  CURRENT THERAPY: Observation.  CHEMOTHERAPY INTENT: Control/curative  CURRENT # OF CHEMOTHERAPY CYCLES: 0  CURRENT ANTIEMETICS: Zofran, dexamethasone and Compazine  CURRENT SMOKING STATUS: Former smoker.  ORAL CHEMOTHERAPY AND CONSENT: None  CURRENT BISPHOSPHONATES USE: None  PAIN MANAGEMENT: 0/10  NARCOTICS INDUCED CONSTIPATION: None  LIVING WILL AND CODE STATUS: Full code   INTERVAL HISTORY: Dawn Mercer 69 y.o. female returns to the clinic today for followup visit accompanied by her husband. The patient is feeling fine today except for the back pain. She is currently on Vicodin for pain management. She has shortness of breath and currently on home oxygen. She continues to have mild cough with no hemoptysis. She denied having any nausea or vomiting. She has no fever or chills. She has no significant weight loss or night sweats. She had MRI of the thoracic spine for evaluation of a compression fracture seen on the previous PET scan. She is here for evaluation and discussion of her MRI results and further recommendation regarding her condition.  MEDICAL HISTORY: Past Medical History:  Diagnosis Date  . Anxiety   .  Bronchitis    Hx: of  . Cervical cancer ([facility]) 10/1999   Stage IB adenosquamous carcinoma  . COPD (chronic obstructive pulmonary disease) ([facility])   . GERD (gastroesophageal reflux disease)   . H/O hiatal hernia   . Hay fever    Hx: of  . History of radiation therapy 01/28/13-03/23/13   63 gray to right upper lobe  . Hypothyroidism   . IBS (irritable bowel syndrome)    Hx: of  . Lung cancer, upper lobe ([facility]) dx'd 12/2012   chemo and XRt complete  . Seizures ([facility])    Hx: of over 40 years ago  . Shortness of breath    Hx: of with exertion  . Status post radiation therapy 10/1999  . 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  MEDICATIONS:  Current Outpatient Prescriptions  Medication Sig Dispense Refill  . albuterol (PROVENTIL HFA;VENTOLIN HFA) 108 (90 BASE) MCG/ACT inhaler Inhale 1 puff into the lungs every 6 (six) hours as needed for wheezing.    Marland Kitchen albuterol (PROVENTIL) (2.5 MG/3ML) 0.083% nebulizer solution Take 3 mLs (2.5 mg total) by nebulization every 6 (six) hours as needed for wheezing or shortness of breath. 150 mL 11  . ALPRAZolam (XANAX) 0.5 MG tablet Take 0.5 tablets (0.25 mg total) by mouth 2 (two) times daily as needed for anxiety. 90 tablet 0  . aspirin 325 MG tablet Take 325 mg by mouth once.    . Cholecalciferol (VITAMIN D-3) 5000 UNITS TABS Take 5,000 Units by mouth 2 (two) times a week.    . gabapentin (NEURONTIN) 100 MG capsule Take 1 capsule (100 mg  total) by mouth 3 (three) times daily. (Patient taking differently: Take 100 mg by mouth daily. ) 270 capsule 1  . HYDROcodone-acetaminophen (NORCO) 5-325 MG tablet Take 1 tablet by mouth every 6 (six) hours as needed. 150 tablet 0  . levofloxacin (LEVAQUIN) 500 MG tablet Take 1 tablet (500 mg total) by mouth daily. 7 tablet 0  . levothyroxine (SYNTHROID, LEVOTHROID) 112 MCG tablet Take 112 mcg by mouth daily before breakfast.    . OXYGEN Inhale into the lungs. 3  lpm with exertion    . predniSONE (DELTASONE) 10 MG tablet Take as directed (Patient taking differently: Take 5 mg by mouth daily with breakfast. Take as directed) 45 tablet 0   No current facility-administered medications for this visit.     SURGICAL HISTORY:  Past Surgical History:  Procedure Laterality Date  . CHOLECYSTECTOMY N/A 08/03/2013   Procedure: LAPAROSCOPIC CHOLECYSTECTOMY;  Surgeon: Harl Bowie, MD;  Location: [facility];  Service: General;  Laterality: N/A;  . COLONOSCOPY W/ BIOPSIES AND POLYPECTOMY     Hx: of  . LASER ABLATION  04/2004   for VIN III  . TUBAL LIGATION    . VIDEO BRONCHOSCOPY WITH ENDOBRONCHIAL NAVIGATION N/A 12/31/2012   Procedure: VIDEO BRONCHOSCOPY WITH ENDOBRONCHIAL NAVIGATION;  Surgeon: Melrose Nakayama, MD;  Location: [facility];  Service: Thoracic;  Laterality: N/A;  . VIDEO BRONCHOSCOPY WITH ENDOBRONCHIAL ULTRASOUND N/A 12/31/2012   Procedure: VIDEO BRONCHOSCOPY WITH ENDOBRONCHIAL ULTRASOUND;  Surgeon: Melrose Nakayama, MD;  Location: [facility];  Service: Thoracic;  Laterality: N/A;    REVIEW OF SYSTEMS:  A comprehensive review of systems was negative except for: Constitutional: positive for fatigue Musculoskeletal: positive for back pain   PHYSICAL EXAMINATION: General appearance: alert, cooperative and no distress Head: Normocephalic, without obvious abnormality, atraumatic Neck: no adenopathy, no JVD, supple, symmetrical, trachea midline and thyroid not enlarged, symmetric, no tenderness/mass/nodules Lymph nodes: Cervical, supraclavicular, and axillary nodes normal. Resp: clear to auscultation bilaterally Back: symmetric, no curvature. ROM normal. No CVA tenderness. Cardio: regular rate and rhythm, S1, S2 normal, no murmur, click, rub or gallop GI: soft, non-tender; bowel sounds normal; no masses,  no organomegaly Extremities: extremities normal, atraumatic, no cyanosis or edema Neurologic: Alert and oriented X 3, normal strength and tone. Normal  symmetric reflexes. Normal coordination and gait  ECOG PERFORMANCE STATUS: 1 - Symptomatic but completely ambulatory  Blood pressure 131/63, pulse 98, temperature 98.4 F (36.9 C), temperature source Oral, resp. rate 18, height '5\' 6"'$  (1.676 m), weight 197 lb 9.6 oz (89.6 kg), SpO2 97 %.  LABORATORY DATA: Lab Results  Component Value Date   WBC 7.6 08/17/2015   HGB 13.7 08/17/2015   HCT 42.7 08/17/2015   MCV 90.5 08/17/2015   PLT 224 08/17/2015      Chemistry      Component Value Date/Time   NA 138 08/17/2015 1110   NA 139 04/05/2015 0935   K 3.9 08/17/2015 1110   K 4.2 04/05/2015 0935   CL 105 08/17/2015 1110   CO2 25 08/17/2015 1110   CO2 25 04/05/2015 0935   BUN 8 08/17/2015 1110   BUN 11.9 04/05/2015 0935   CREATININE 0.80 08/17/2015 1110   CREATININE 0.8 04/05/2015 0935      Component Value Date/Time   CALCIUM 8.8 (L) 08/17/2015 1110   CALCIUM 9.3 04/05/2015 0935   ALKPHOS 111 04/05/2015 0935   AST 15 04/05/2015 0935   ALT 11 04/05/2015 0935   BILITOT 0.97 04/05/2015 0935  RADIOGRAPHIC STUDIES: Mr Thoracic Spine W Wo Contrast  Result Date: 09/25/2015 CLINICAL DATA:  Stage IIIA lung cancer. Severe mid back pain. Evaluate for T5 lesion EXAM: MRI THORACIC SPINE WITHOUT AND WITH CONTRAST TECHNIQUE: Multiplanar and multiecho pulse sequences of the thoracic spine were obtained without and with intravenous contrast. CONTRAST:  18 cc MultiHance intravenous COMPARISON:  PET-CT 09/06/2015.  Chest CT 08/17/2015 FINDINGS: Sagittal counting sequence shows no acute or aggressive finding the cervical spine. Normal thoracic alignment. T5 compression fracture with moderate height loss (approximately 50%). Inferior endplate is compressed with subjacent sclerotic signal. There is also an oblique band of sclerotic type signal across the mid and upper T5 body. This fracture has occurred since 08/17/2015 when no signs of bone lesion was noted. There is homogeneous enhancement within  the entire body. There is no posterior cortex bowing, soft tissue mass, or pedicular involvement strongly concerning for underlying metastasis. There is prominent fat within the T4 to upper T7 vertebral bodies correlating with radiation. Mild patchy enhancement in the T6 body is likely also radiation related. No other finding elsewhere typical of metastasis. Recently staged right lung primary. Normal appearance of the cord. No notable degenerative change or impingement. IMPRESSION: Recent T5 compression fracture with moderate height loss. Features and chest CT 08/17/2015 favor insufficiency over pathologic fracture. Expected enhancement and edema could obscure an underlying bone lesion; preferably would obtain MRI follow-up in 3 months to confirm expected evolution. Electronically Signed   By: Monte Fantasia M.D.   On: 09/25/2015 14:25   Nm Pet Image Restag (ps) Skull Base To Thigh  Result Date: 09/06/2015 CLINICAL DATA:  Subsequent treatment strategy for lung carcinoma. RIGHT upper lobe carcinoma. EXAM: NUCLEAR MEDICINE PET SKULL BASE TO THIGH TECHNIQUE: 10.5 mCi F-18 FDG was injected intravenously. Full-ring PET imaging was performed from the skull base to thigh after the radiotracer. CT data was obtained and used for attenuation correction and anatomic localization. FASTING BLOOD GLUCOSE:  Value: 96 mg/dl COMPARISON:  PET-CT 08/20/2013 FINDINGS: NECK No hypermetabolic lymph nodes in the neck. CHEST RIGHT perihilar thickening with moderate metabolic activity (SUV max equal 4.7) but no focality. There is similar activity within the anterior RIGHT chest wall. Favored post radiation change. There is 1 focus of pleural thickening in the RIGHT lower lobe on image 60 of the fused data set. Peripheral nodularity in the RIGHT upper lobe without significant metabolic activity. LEFT lung is relatively clear. ABDOMEN/PELVIS Small LEFT adrenal adenoma. There are no abnormal metabolic activity in the liver or spleen. No  hypermetabolic abdominal pelvic lymph nodes. Atherosclerotic calcification aorta. SKELETON There is focal activity within the mid thoracic spine localizing to the T5 vertebral body. This is within the field of marrow photopenia as seen on comparison exam. Activity is moderate with SUV max equals 6.8. There is no lesion identified on comparison CT of 08/17/2015. IMPRESSION: 1. No clear evidence of local recurrence in the RIGHT hilum at site of postradiation thickening. 2. Single focus of hypermetabolic pleural thickening in the RIGHT lower lobe. Recommend attention on follow-up with short interval CT of the thorax contrast (3 months). 3. Increased activity within the T5 vertebral body within a field of photopenia from prior radiation. Differential would include occult fracture (potentially from radiation) versus skeletal metastasis. No lesion identified on comparison CT of 08/17/2015. Consider MRI of the thoracic spine for further characterization. Electronically Signed   By: Suzy Bouchard M.D.   On: 09/06/2015 18:55   ASSESSMENT AND PLAN: This is a very pleasant  70 years old white female recently diagnosed with a stage IIIA non-small cell lung cancer currently undergoing concurrent chemoradiation with weekly carboplatin and paclitaxel status post 8 weekly treatment followed by consolidation chemotherapy with carboplatin and paclitaxel status post 3 cycles.  She has been on observation for more than 2 yearsl. She has been complaining of chest pressure as well as shortness breath and cough productive of yellowish sputum recently. CT angiogram of the chest performed on 08/17/2015 showed increase in the right perihilar soft tissue and right hilar adenopathy suspicious for disease progression. A PET scan performed recently showed no clear evidence for disease recurrence but there was questionable lesion at T5 vertebral body concerning for osteopenia from prior radiation versus bone metastasis. MRI of the  thoracic spine showed no clear evidence of metastatic disease to the T5 lesion and its likely to represent insufficiency over pathologic fracture. I discussed the results with the patient and her husband. I recommended for her to follow with her primary care physician for consideration of treatment with calcium supplement and Fosamax if needed. I will see her back for follow-up visit in 3 months for reevaluation with repeat CT scan of the chest. She was advised to call immediately if she has any concerning symptoms in the interval. The patient voices understanding of current disease status and treatment options and is in agreement with the current care plan.  All questions were answered. The patient knows to call the clinic with any problems, questions or concerns. We can certainly see the patient much sooner if necessary.  Disclaimer: This note was dictated with voice recognition software. Similar sounding words can inadvertently be transcribed and may not be corrected upon review.

## 2015-10-02 NOTE — Telephone Encounter (Signed)
Gv pt appts for 9/18, 10/30, + 11/6.

## 2015-10-05 ENCOUNTER — Ambulatory Visit: Payer: Medicare Other | Admitting: Pulmonary Disease

## 2015-10-10 ENCOUNTER — Other Ambulatory Visit: Payer: Medicare Other

## 2015-10-17 ENCOUNTER — Ambulatory Visit: Payer: Medicare Other | Admitting: Internal Medicine

## 2015-11-13 ENCOUNTER — Ambulatory Visit (HOSPITAL_BASED_OUTPATIENT_CLINIC_OR_DEPARTMENT_OTHER): Payer: Medicare Other

## 2015-11-13 ENCOUNTER — Other Ambulatory Visit (HOSPITAL_BASED_OUTPATIENT_CLINIC_OR_DEPARTMENT_OTHER): Payer: Medicare Other

## 2015-11-13 DIAGNOSIS — Z452 Encounter for adjustment and management of vascular access device: Secondary | ICD-10-CM

## 2015-11-13 DIAGNOSIS — G620 Drug-induced polyneuropathy: Secondary | ICD-10-CM

## 2015-11-13 DIAGNOSIS — C3411 Malignant neoplasm of upper lobe, right bronchus or lung: Secondary | ICD-10-CM

## 2015-11-13 DIAGNOSIS — Z85118 Personal history of other malignant neoplasm of bronchus and lung: Secondary | ICD-10-CM | POA: Diagnosis not present

## 2015-11-13 DIAGNOSIS — T451X5A Adverse effect of antineoplastic and immunosuppressive drugs, initial encounter: Principal | ICD-10-CM

## 2015-11-13 LAB — COMPREHENSIVE METABOLIC PANEL
ALBUMIN: 3.4 g/dL — AB (ref 3.5–5.0)
ALK PHOS: 112 U/L (ref 40–150)
ALT: 15 U/L (ref 0–55)
AST: 20 U/L (ref 5–34)
Anion Gap: 10 mEq/L (ref 3–11)
BUN: 9 mg/dL (ref 7.0–26.0)
CALCIUM: 9.5 mg/dL (ref 8.4–10.4)
CO2: 28 mEq/L (ref 22–29)
CREATININE: 0.7 mg/dL (ref 0.6–1.1)
Chloride: 101 mEq/L (ref 98–109)
EGFR: 84 mL/min/{1.73_m2} — ABNORMAL LOW (ref >90)
GLUCOSE: 90 mg/dL (ref 70–140)
Potassium: 4.4 mEq/L (ref 3.5–5.1)
SODIUM: 139 meq/L (ref 136–145)
Total Bilirubin: 0.78 mg/dL (ref 0.20–1.20)
Total Protein: 7.6 g/dL (ref 6.4–8.3)

## 2015-11-13 LAB — CBC WITH DIFFERENTIAL/PLATELET
BASO%: 0.7 % (ref 0.0–2.0)
Basophils Absolute: 0.1 10*3/uL (ref 0.0–0.1)
EOS%: 2.9 % (ref 0.0–7.0)
Eosinophils Absolute: 0.2 10*3/uL (ref 0.0–0.5)
HEMATOCRIT: 41.3 % (ref 34.8–46.6)
HEMOGLOBIN: 13.3 g/dL (ref 11.6–15.9)
LYMPH#: 1.7 10*3/uL (ref 0.9–3.3)
LYMPH%: 22 % (ref 14.0–49.7)
MCH: 29 pg (ref 25.1–34.0)
MCHC: 32.3 g/dL (ref 31.5–36.0)
MCV: 89.8 fL (ref 79.5–101.0)
MONO#: 0.6 10*3/uL (ref 0.1–0.9)
MONO%: 8.1 % (ref 0.0–14.0)
NEUT%: 66.3 % (ref 38.4–76.8)
NEUTROS ABS: 5.1 10*3/uL (ref 1.5–6.5)
Platelets: 225 10*3/uL (ref 145–400)
RBC: 4.6 10*6/uL (ref 3.70–5.45)
RDW: 14.2 % (ref 11.2–14.5)
WBC: 7.6 10*3/uL (ref 3.9–10.3)

## 2015-11-13 MED ORDER — SODIUM CHLORIDE 0.9 % IJ SOLN
10.0000 mL | INTRAMUSCULAR | Status: DC | PRN
  Administered 2015-11-13: 10 mL via INTRAVENOUS
  Filled 2015-11-13: qty 10

## 2015-11-13 MED ORDER — HEPARIN SOD (PORK) LOCK FLUSH 100 UNIT/ML IV SOLN
500.0000 [IU] | Freq: Once | INTRAVENOUS | Status: AC | PRN
  Administered 2015-11-13: 500 [IU] via INTRAVENOUS
  Filled 2015-11-13: qty 5

## 2015-11-15 ENCOUNTER — Telehealth: Payer: Self-pay | Admitting: Pulmonary Disease

## 2015-11-15 NOTE — Telephone Encounter (Signed)
Called and spoke with pt and she stated that she was trying out the G4 from [employer].  The pt stated that she feels that she is not getting enough oxygen with this device and is wanting to return it to [employer].  Pt stated that she will stay with [facility].    Called [employer] and spoke with Ramona and she stated that she will need a written order to stop the oxygen service with [employer].  Will have SN sign this in the morning and get it faxed over to them.

## 2015-11-16 ENCOUNTER — Telehealth: Payer: Self-pay | Admitting: Pulmonary Disease

## 2015-11-16 NOTE — Telephone Encounter (Signed)
Pt needing to have back surgery to have disc repair.  Pt scheduled for clearance appt 11/20/15 at 11:00 with SN.  Nothing further needed.

## 2015-11-16 NOTE — Telephone Encounter (Signed)
Order has been signed by SN and faxed back to [employer] to d/c her oxygen.

## 2015-11-20 ENCOUNTER — Encounter: Payer: Self-pay | Admitting: Pulmonary Disease

## 2015-11-20 ENCOUNTER — Ambulatory Visit (INDEPENDENT_AMBULATORY_CARE_PROVIDER_SITE_OTHER): Payer: Medicare Other | Admitting: Pulmonary Disease

## 2015-11-20 ENCOUNTER — Ambulatory Visit: Payer: Self-pay | Admitting: Physician Assistant

## 2015-11-20 VITALS — BP 120/70 | HR 77 | Temp 99.0°F | Ht 66.0 in | Wt 193.0 lb

## 2015-11-20 DIAGNOSIS — M546 Pain in thoracic spine: Secondary | ICD-10-CM

## 2015-11-20 DIAGNOSIS — J449 Chronic obstructive pulmonary disease, unspecified: Secondary | ICD-10-CM | POA: Diagnosis not present

## 2015-11-20 DIAGNOSIS — J9611 Chronic respiratory failure with hypoxia: Secondary | ICD-10-CM | POA: Diagnosis not present

## 2015-11-20 DIAGNOSIS — C3411 Malignant neoplasm of upper lobe, right bronchus or lung: Secondary | ICD-10-CM | POA: Diagnosis not present

## 2015-11-20 DIAGNOSIS — S22000A Wedge compression fracture of unspecified thoracic vertebra, initial encounter for closed fracture: Secondary | ICD-10-CM | POA: Insufficient documentation

## 2015-11-20 MED ORDER — ALBUTEROL SULFATE (2.5 MG/3ML) 0.083% IN NEBU: 2.5000 mg | INHALATION_SOLUTION | Freq: Three times a day (TID) | RESPIRATORY_TRACT | 11 refills | Status: DC

## 2015-11-20 MED ORDER — BUDESONIDE 0.25 MG/2ML IN SUSP: 0.2500 mg | Freq: Three times a day (TID) | RESPIRATORY_TRACT | 11 refills | Status: DC

## 2015-11-20 NOTE — Patient Instructions (Signed)
Today we updated your med list in our EPIC system...    We made some furter adjustments in your pulmonary regimen>>    Instead of the Breo & Incruse- use the NEBULIZER with:       1) DUONEB three times daily... Followed by        2) BUDESONIDE three times daily...  Continue your oxygen at 2L/min flow rate...  When you are able after the Kyphoplasty-- start back on a gradual exercise program--    And if your want to -- we can refer you to the [place]..Marland Kitchen   Continue your OSTEOPOROSIS medication per DrBrooks and DrRoss...  Call for any questions...  Let's plan a follow up visit in 27mo sooner if needed for problems..Marland KitchenMarland Kitchen

## 2015-11-20 NOTE — Progress Notes (Addendum)
Subjective:     Patient ID: Dawn Mercer, female   DOB: 04-10-1945, 70 y.o.   MRN: [id number]  HPI  ~  May 31, 2015:  Initial pulmonary evaluation by SN>   59 y/o WF, referred by Dr.C Melinda Crutch, [facility] at [facility], for a pulmonary work-up due to dyspnea & hypoxemia>     [name] is an ex-smoker and was diagnosed w/ Squamous cell lung cancer in 2014 after presenting w/ right sided CWP and a CXR showed a right lung mass;  CT showed a 1.7cm RUL mass w/ some hilar & mediastinal adenopathy that were all PET pos;  She had a navigational bronch w/ EBUS bxs of the nodes per Wright 12/2012 => atypical cells but not suffic for diagnosis; subsequest TTBx by DrYamagata was pos for Squamous cell lung cancer;  DrMohammed has supervised her treatment for this Stage IIIa non-small cell tumor- starting w/ chemoradiation w/ carboplatin & paclitaxel while receiving XRT- 63gray in 35 fractions completed 03/23/13;  This was followed by consolidation chemotherapy for 3 more cycles (last dose 06/14/13); f/u CT Chest showed a partial response;  She was then seen back by the thoracic surgeons to consider poss surgical resection of the RUL;  MRI brain was neg for signs of metastatic dis;  Repeat PET scan showed decr size of the RUL mass and resolution of the metabolic activity in the mediastinal nodes, no new dis;  PFTs showed COPD Stage-3 COPD w/ DLCO reduced at 29%;  They did not recommend proceeding w/ surgical resection...     She has continued to f/u w/ DrMohammed> Last seen 04/12/15>  Note reviewed, she remains on observation x 39yr now, CT Chest 04/05/15 showed norm heart size, right sided port-a-cath, no pathologically enlarged nodes, biapical pleuroparenchymal scarring & mod centrilobular emphysema w/ bullous dis on the left, 784mRUL nodule- stable, left adrenal adenoma- see full report; f/u planned in 13m23mo  Smoking Hx>  She is an ex-smoker having started age 52,27moked for 35 yrs up to 1ppd, quit in 2000 w/  diagnosis of cervical cancer...  Pulmonary Hx>  She denies hx of signif lung problems- never told to have asthma, rare bronchitic infections even when smoking, never had pneumonia, no known TB or exposure...   Medical Hx>     Hypothyroid, HH/ GERD/ IBS, Anxiety...  She also has a hx of early stage cervical cancer- treated by DrKinard w/ XRT & intracavitary brachytherapy using cesium, no recurrence in continued follow up...  Family Hx>  One brother had small cell lung cancer, no other lung dis in the family...  Occup Hx>  She denies known exposure to asbestos, silica dust, other inorganic or organic dusts, etc...  Current Meds>  O2 at 2-3L/min;  AlbuterolHFA prn;  Dexilant60,  Neurontin100Tid;  Vicodin5 Q6H prn, Synthroid112, Xanax0.5-1/2 Bid prn;  VitD 5000u...  PET scan 12/17/12 showed 69m32mL hypermet nodule, underlying mod centrilob & paraseptal emophysema, hypermet precarinal & right hilar adenopathy, brachytherapy seeds seen in region of cervix, healing fx R 6th Rib  Last CXR 07/30/13>  Port-a-cath on right, underlying COPD/emphysema, RUL nodule difficult to see on CXR...   Prev Full PFTs 08/19/13>  FVC=1.64 (52%), FEV1=1.03 (43%), %1sec=63, mid-flows reduced at 26% predicted; post bronchodil FEV1 improved 3%;  TLC=4.39 (86%);  DLCO=29% and DL/VA=55...  Last CT Chest 04/05/15>  Port-a-cath on right, norm heart size & coronary calcif, no enlarged nodes, biapical pleuro-parenchymal scarring R>L w/ 7mm 25m nodule (stable), mod centrilob emphysema w/ bullae on left,  architectural distortion in RUL, cholecystectomy, left adrenal adenoma...   Last Labs 03/2015>  CBC- wnl;  Chems- ok x Alb=3.4 EXAM shows Afeb, VSS, O2sat=94% on 3LO2; Wt=188#, 5'6"Tall, BMI=30;  HEENT- nasalO2, mallampati1;  Chest- bibasilar crackles, few scat rhonchi, no wheezing;  Heart- RR w/o m/r/g;  Abd- protuberant soft nontender;  Ext- w/o c/c/e.   CXR 05/31/15> port-a-cath on right, borderline heart size, COPD w/ scarring  RUL likely due to XRT, no acute changes on left...  Spirometry 05/31/15>  FVC=1.35 (43%), FEV1=0.88 (36%), %1sec=65, mid-flows reduced at 16% predicted => c/w mod airflow obstruction and GOLD Stage3 COPD... IMP >>     COPD/ Emphysema>  GOLD Stage 3 COPD/ Emphysema    Hx hypoxemic resp failure> on 2L/min O2    Hx Stage 3A non-small cell lung cancer (squamous cell ca) Dx 12/2012- treated by DrMohamed & DrKinard w/ chemoradiation followed by consolidation chemotherapy; TSurg declined resection & she has been on observation since 05/2013     Ex cigarette smoker>  She quit smoking in 2000    MEDICAL issues>   Hypothyroid, HH/ GERD/ IBS, hx cervical cancer, Anxiety... Followed by [employer]. PLAN >>     Anae should benefit from a more aggressive bronchodilator regimen- in this regard she indicated that cost would be a factor so we decided to start Rx w/ NEBULIZER using DUONEB Tid, and PREDNISONE '20mg'$ => '10mg'$  over the next month;  She will also try MUCINEX '600mg'$ - 2Bid, w/ fluids and use her rescue inhaler as needed... We will plan a recheck in 53mo   ~  Jul 05, 2015:  178moOV w/ SN>  [age]/o WF seen 05/31/15 for pulm consult due to dyspnea- she is an ex-smoker w/ severe COPD/emphysema & hx RUL (staged IIIa) squamous cell lung cancer (12/2012) treated w/ chemoRx & XRT- followed by DrMohammed on observation for the past 2y51yr We maximized her bronchodil regimen w/ DUONEB tid, Pred20=>'10mg'$ /d, Mucinex600-2Bid, ProairHFA rescue as needed (cost was a huge factor in defining her meds); she is also on O2 at 2L/min pulse dose from her POC...  She returns today noting sl jittery on the NEBs & she has decr the dose to 1/2 vial & deals w/ it (but only using this prn); Pred really helps she says; she is only using the O2 "prn" too;  She says she's breathing OK, min cough, sm amt clear sput, no hemoptysis, denies any change in SOB or CP EXAM shows Afeb, VSS, O2sat=92% on RA; Wt=195# (up7#);  HEENT- nasalO2, mallampati1;  Chest-  bibasilar crackles, few scat rhonchi, no wheezing;  Heart- RR w/o m/r/g;  Abd- protuberant soft nontender;  Ext- w/o c/c/e.   Ambulatory O2sat>  O2sat=92% on RA at rest w/ pulse=96/min;  She ambulated just 1 Lap w/ drop in O2sat to 77% w/ pulse=101/min... IMP/PLAN>>  BreCamri NOT using her NEBs or Oxygen- says she prefers inhalers and we will order BREO one inhalation daily + INCRUSE one inhalation daily; stay on Pred '10mg'$ /d & try to wean to '5mg'$ /d if able; I stressed the severity of her COPD/emphysema and the NEED for regular medication & OXYGEN w/ all activities and Qhs... ADDENDUM>>  ONO on RA performed 07/09/15>  She slept for 7H, spent 5H20m7m88%sat, ans 40mi56m0% w/ nadir O2sat=72%... She is rec to use her Home O2 24/7 w/ 2L/min at rest and 3L/min w/ exercise...   ~  November 20, 2015:  25mo R43mo/ SN>  70 y/o20F returns for pulmonary follow up  visit and pre-op clearance for DrDBrooks, Ortho to do T5 Kyphoplasty procedure...     Elianne presented to the [facility] 08/17/15 w/ a 3d hx CP>  Notes reviewed in EPIC- serial enz & EKGs were neg; CXR showed borderline heart size, post radiation changes in RUL & elev right hemidiaph- NAD, left apical pleuro-parenchymal thickening, osteopenia and T5 compression;  Her D-dimer was elev at 1.54 and subseq CT Angio showed norm heart size, right perihilar mass measures larger than on the comparison scan 03/2015 & right hilar LN is enlarged, no PE identified, there was also an increase in loculated pleural fluid & pleural nodularity on the right, advanced centrilob & paraseptal emphysema, diffuse parenchymal scarring on right;  These changes were concerning for poss progression of tumor & she was to f/u w/ her oncologist...     She saw DrMohamed in f/u 6/29, 7/24, & 10/02/15> Hx Stage IIIA non-small cell lung cancer (Squamous cell) Dx 12/2012 w/ RUL nodule and +mediastinal nodes treated w/ Chemoradiation thru 03/15/13, followed by consolidation chenotherapy for 3  cycles finished 06/14/13; she has been on observation since then; she had PET scan done 09/06/15 showing right perihilar thickening w/ mod metabolic activ favored to represent post radiation change, 1 focus of pleural thickening in RLL, periph nodularity in RUL w/o signif metabolic activity, +metabolic activ in T5 vertebral body;  DrMohamed proceeded w/ MRI Tspine 09/25/15=> T5 compression ~50% w/ images favoring insuffic fx over pathologic fx (no clear sign of metastatic dis)...     DrMohamed sent her back to her PCP & she made her way to DrDBrooks, Ortho (no notes avail in epic) and she tells me that she was placed on Fosamax70/wk, Miacalcin NS daily, and sched for T5 Kyphoplasty 12/06/15; she believes "the XRT did this to me"; in the meanwhile she is using Vicodin for pain...     Jadasia continues to under-medicate herself from the pulmonary standpoint> last OV she said she preferred the inhalers & she was placed on BREO & INCRUSE, +Pred '10mg'$  => wean to '5mg'$ /d if stable; she has nocturnal and exercise induced hypoxemia & rec to use O2 regularly- 2L/min Qhs and 3L/min w/ activity/exercise;  She has NEB w/ Albut & Proair-HFAfor prn use;  However once again she tells me that she stopped the inhalers (too $$ & never filled the Rx & never called back for options), that she is NOT using the O2 regularly (just using prn), and that she stopped the Pred ~3wks ago (ran out & didn't refill)... We discussed these issues again & in light of her poor compliance and GOLD Stage3 COPD (mixed chr bronchitis & emphysema) we will switch her back to NEBULIZER w/ DUONEB Tid + BUDESONIDE 0.25Tid, plus her OXYGEN at 2L/min continuous 24/7...     EXAM shows Afeb, VSS, O2sat=97% on 2L/min; Wt=193#;  HEENT- nasalO2, mallampati1;  Chest- bibasilar crackles, few scat rhonchi, no wheezing;  Heart- RR w/o m/r/g;  Abd- protuberant soft nontender;  Ext- w/o c/c/e. IMP/PLAN>>      COPD/ Emphysema>  GOLD Stage 3 COPD/ Emphysema w/ hx of poor  medication compliance => we opted for NEBS w/ DUONEB Tid & BUDESONIDE-0.25Tid...    Nocturnal & activity/exercise induced hypoxemia> rec to use her O2 = 24/7 w/ 2L/min Qhs and 3L/min w/ exercise...    Hx Stage 3A non-small cell lung cancer (squamous cell ca) Dx 12/2012- treated by DrMohamed & DrKinard w/ chemoradiation followed by consolidation chemotherapy; TSurg declined resection & she has been on observation  since 05/2013;  She developed chest & back pain 07/2015 w/ eval by [facility] and DrMohamed as above- concern for poss disease progression on right but PET favored radiation change...    Ex cigarette smoker>  She quit smoking in 2000...    T5 compression fx> eval w/ PET, MRI favored insuffic fx & she was started on Fosamax70/wk + Miacalcin daily; on Vicodin for pain; DrDBrooks plans T5 Kyphoplasty 12/06/15 & she is OK for the surg...    MEDICAL issues>   Hypothyroid, HH/ GERD/ IBS, hx cervical cancer, Anxiety... All followed by PCP- DrARoss.  ~  ADDENDUM>>  Back and forth w/ [facility] stating that Pts insurance refused Budesonide 0.'25mg'$  Tid- they will only cover it Bid and they are demanding a copy of this note showing that we have changed the prescription for the inhaled BUDESONIDE 0.25 via NEB to Bid- so ordered... [facility]   Past Medical History:  Diagnosis Date  . Anxiety   . Bronchitis    Hx: of  . 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 COPD (chronic obstructive pulmonary disease) ([place])   . GERD (gastroesophageal reflux disease)   . H/O hiatal hernia   . Hay fever    Hx: of  . History of radiation therapy 01/28/13-03/23/13   63 gray to right upper lobe  . Hypothyroidism   . IBS (irritable bowel syndrome)    Hx: of  . Lung cancer, upper lobe ([place]) dx'd 12/2012   chemo and XRt complete  . Seizures ([place])    Hx: of over 40 years ago  . Shortness of breath    Hx: of with exertion  . Status post radiation therapy 10/1999  . 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 CHOLECYSTECTOMY N/A 08/03/2013   Procedure: LAPAROSCOPIC CHOLECYSTECTOMY;  Surgeon: Harl Bowie, MD;  Location: [place];  Service: General;  Laterality: N/A;  . COLONOSCOPY W/ BIOPSIES AND POLYPECTOMY     Hx: of  . LASER ABLATION  04/2004   for VIN III  . TUBAL LIGATION    . VIDEO BRONCHOSCOPY WITH ENDOBRONCHIAL NAVIGATION N/A 12/31/2012   Procedure: VIDEO BRONCHOSCOPY WITH ENDOBRONCHIAL NAVIGATION;  Surgeon: Melrose Nakayama, MD;  Location: [place];  Service: Thoracic;  Laterality: N/A;  . VIDEO BRONCHOSCOPY WITH ENDOBRONCHIAL ULTRASOUND N/A 12/31/2012   Procedure: VIDEO BRONCHOSCOPY WITH ENDOBRONCHIAL ULTRASOUND;  Surgeon: Melrose Nakayama, MD;  Location: [place];  Service: Thoracic;  Laterality: N/A;    Outpatient Encounter Prescriptions as of 05/31/2015  Medication Sig  . albuterol (PROVENTIL HFA;VENTOLIN HFA) 108 (90 BASE) MCG/ACT inhaler Inhale 1 puff into the lungs every 6 (six) hours as needed for wheezing.  Marland Kitchen ALPRAZolam (XANAX) 0.5 MG tablet Take 0.5 tablets (0.25 mg total) by mouth 2 (two) times daily as needed for anxiety.  . Cholecalciferol (VITAMIN D-3) 5000 UNITS TABS Take 5,000 Units by mouth 2 (two) times a week.  . DEXILANT 60 MG capsule Take 60 mg by mouth daily as needed.   . gabapentin (NEURONTIN) 100 MG capsule Take 1 capsule (100 mg total) by mouth 3 (three) times daily.  Marland Kitchen HYDROcodone-acetaminophen (NORCO) 5-325 MG tablet Take 1 tablet by mouth every 6 (six) hours as needed.  Marland Kitchen levothyroxine (SYNTHROID, LEVOTHROID) 112 MCG tablet Take 112 mcg by mouth daily before breakfast.  . OXYGEN Inhale into the lungs. 3 lpm with exertion  . [DISCONTINUED] Multiple Vitamins-Minerals (MULTIVITAMIN ADULTS 50+  PO) Take by mouth. Reported on 05/31/2015    Allergies  Allergen Reactions  . Latex Dermatitis  . Codeine Nausea Only    Immunization History  Administered Date(s) Administered  .  Influenza,inj,Quad PF,36+ Mos 02/15/2013, 04/25/2014, 10/20/2015  . Pneumococcal Conjugate-13 05/31/2015  . Pneumococcal Polysaccharide-23 11/20/1999  She has had the yearly Flu vaccine from PCP... Pt given PREVNAR-13 shot 05/31/15...   Current Medications, Allergies, Past Medical History, Past Surgical History, Family History, and Social History were reviewed in [employer] record.   Review of Systems             All symptoms NEG except where BOLDED >>  Constitutional:  F/C/S, fatigue, anorexia, unexpected weight change. HEENT:  HA, visual changes, hearing loss, earache, nasal symptoms, sore throat, mouth sores, hoarseness. Resp:  cough, sputum, hemoptysis; SOB, tightness, wheezing. Cardio:  CP, palpit, DOE, orthopnea, edema. GI:  N/V/D/C, blood in stool; reflux, abd pain, distention, gas. GU:  dysuria, freq, urgency, hematuria, flank pain, voiding difficulty. MS:  joint pain, swelling, tenderness, decr ROM; neck pain, back pain, etc. Neuro:  HA, tremors, seizures, dizziness, syncope, weakness, numbness, gait abn. Skin:  suspicious lesions or skin rash. Heme:  adenopathy, bruising, bleeding. Psyche:  confusion, agitation, sleep disturbance, hallucinations, anxiety, depression suicidal.   Objective:   Physical Exam       Vital Signs:  Reviewed...   General:  WD, WN, 70 y/o WF in NAD; alert & oriented; pleasant & cooperative... HEENT:  [place]/AT; Conjunctiva- pink, Sclera- nonicteric, EOM-wnl, PERRLA, EACs-clear, TMs-wnl; NOSE-clear; THROAT-clear & wnl.  Neck:  Supple w/ fair ROM; no JVD; normal carotid impulses w/o bruits; no thyromegaly or nodules palpated; no lymphadenopathy.  Chest:  Few bibasilar rales w/o wheezing/ rhonchi or signs of consolidation... Sl tender over mid-Tspine... Heart:  Regular Rhythm; norm S1 & S2 without murmurs, rubs, or gallops detected. Abdomen:  Soft & nontender- no guarding or rebound; normal bowel sounds; no organomegaly or masses  palpated. Ext:  decrROM; without deformities or arthritic changes; no varicose veins, +venous insuffic, tr edema;  Pulses intact w/o bruits. Neuro:  CNs II-XII intact; motor testing normal; sensory testing normal; gait normal & balance OK. Derm:  No lesions noted; no rash etc. Lymph:  No cervical, supraclavicular, axillary, or inguinal adenopathy palpated.   Assessment:      IMP >>     COPD/ Emphysema>  GOLD Stage 3 COPD/ Emphysema w/ hx of poor medication compliance => we opted for NEBS w/ DUONEB Tid & BUDESONIDE-0.25Tid...    Nocturnal & activity/exercise induced hypoxemia> rec to use her O2 = 24/7 w/ 2L/min Qhs and 3L/min w/ exercise...    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    Ex cigarette smoker>  She quit smoking in 2000...    T5 compression fx> eval w/ PET, MRI favored insuffic fx & she was started on Fosamax70/wk + Miacalcin daily; on Vicodin for pain; DrDBrooks plans T5 Kyphoplasty 12/06/15 & she is OK for the surg...    MEDICAL issues>   Hypothyroid, HH/ GERD/ IBS, hx cervical cancer, Anxiety... All followed by PCP- DrARoss.  PLAN >>  05/31/15>   Thresia should benefit from a  more aggressive bronchodilator regimen- in this regard she indicated that cost would be a factor so we decided to start Rx w/ NEBULIZER using DUONEB Tid, and PREDNISONE '20mg'$ => '10mg'$  over the next month;  She will also try MUCINEX '600mg'$ - 2Bid, w/ fluids and use her rescue inhaler as needed... We will plan a recheck in 15mo 07/05/15>   BAnniahis NOT using her NEBs or Oxygen- says she prefers inhalers and we will order BREO one inhalation daily + INCRUSE one inhalation  daily; stay on Pred '10mg'$ /d & try to wean to '5mg'$ /d if able; I stressed the severity of her COPD/emphysema and the NEED for regular medication & OXYGEN w/ all activities and Qhs... 11/20/15>   We discussed these issues again & in light of her poor compliance and GOLD Stage3 COPD (mixed chr bronchitis & emphysema) we will switch her back to NEBULIZER w/ DUONEB Tid + BUDESONIDE 0.25Tid, plus her OXYGEN at 2L/min continuously (bump to 3L w/ exercise if n[place]...     Plan:     Patient's Medications  New Prescriptions   DUONEB solution (ALBUT + IPRATROPIUM)     USE IN NEBULIZER Tid regularly   BUDESONIDE (PULMICORT) 0.25 MG/2ML NEBULIZER SOLUTION    Take 2 mLs (0.25 mg total) by nebulization 3 (three) times daily.  Previous Medications   ALBUTEROL (PROVENTIL HFA;VENTOLIN HFA) 108 (90 BASE) MCG/ACT INHALER    Inhale 1 puff into the lungs every 6 (six) hours as needed for wheezing.   ALPRAZOLAM (XANAX) 0.5 MG TABLET    Take 0.5 tablets (0.25 mg total) by mouth 2 (two) times daily as needed for anxiety.   CHOLECALCIFEROL (VITAMIN D3) 400 UNITS CAPS    Take 1 tablet by mouth 2 (two) times daily.   GABAPENTIN (NEURONTIN) 100 MG CAPSULE    Take 100 mg by mouth daily.   HYDROCODONE-ACETAMINOPHEN (NORCO) 5-325 MG TABLET    Take 1 tablet by mouth every 6 (six) hours as needed.   LEVOTHYROXINE (SYNTHROID, LEVOTHROID) 125 MCG TABLET    Take 125 mcg by mouth daily before breakfast.   OXYGEN    Inhale into the lungs. 3 lpm with exertion  Modified Medications   Modified Medication Previous Medication   ALBUTEROL (PROVENTIL) (2.5 MG/3ML) 0.083% NEBULIZER SOLUTION albuterol (PROVENTIL) (2.5 MG/3ML) 0.083% nebulizer solution      Take 3 mLs (2.5 mg total) by nebulization 3 (three) times daily.    Take 2.5 mg by nebulization 3 (three) times daily.  Discontinued Medications   ALBUTEROL (PROVENTIL) (2.5 MG/3ML) 0.083% NEBULIZER SOLUTION    Take 3 mLs (2.5 mg total) by nebulization every 6 (six) hours as needed for wheezing  or shortness of breath.   ASPIRIN 325 MG TABLET    Take 325 mg by mouth once.   CHOLECALCIFEROL (VITAMIN D-3) 5000 UNITS TABS    Take 5,000 Units by mouth 2 (two) times a week.   GABAPENTIN (NEURONTIN) 100 MG CAPSULE    Take 1 capsule (100 mg total) by mouth 3 (three) times daily.   LEVOTHYROXINE (SYNTHROID, LEVOTHROID) 112 MCG TABLET    Take 112 mcg by mouth daily before breakfast.   PREDNISONE (DELTASONE) 10 MG TABLET    Take as directed

## 2015-11-29 ENCOUNTER — Telehealth: Payer: Self-pay | Admitting: Pulmonary Disease

## 2015-11-29 ENCOUNTER — Encounter (HOSPITAL_COMMUNITY)
Admission: RE | Admit: 2015-11-29 | Discharge: 2015-11-29 | Disposition: A | Discharge status: Home / Self Care | Payer: Medicare Other | Source: Ambulatory Visit | Attending: Orthopedic Surgery | Admitting: Orthopedic Surgery

## 2015-11-29 ENCOUNTER — Encounter (HOSPITAL_COMMUNITY): Payer: Self-pay

## 2015-11-29 DIAGNOSIS — M4854XA Collapsed vertebra, not elsewhere classified, thoracic region, initial encounter for fracture: Secondary | ICD-10-CM | POA: Diagnosis present

## 2015-11-29 HISTORY — DX: Pneumonia, unspecified organism: J18.9

## 2015-11-29 HISTORY — DX: Claustrophobia: F40.240

## 2015-11-29 HISTORY — DX: Polyneuropathy, unspecified: G62.9

## 2015-11-29 LAB — BASIC METABOLIC PANEL
ANION GAP: 7 (ref 5–15)
BUN: 8 mg/dL (ref 6–20)
CALCIUM: 8.8 mg/dL — AB (ref 8.9–10.3)
CO2: 27 mmol/L (ref 22–32)
CREATININE: 0.61 mg/dL (ref 0.44–1.00)
Chloride: 104 mmol/L (ref 101–111)
Glucose, Bld: 94 mg/dL (ref 65–99)
Potassium: 3.9 mmol/L (ref 3.5–5.1)
SODIUM: 138 mmol/L (ref 135–145)

## 2015-11-29 LAB — CBC
HCT: 40.2 % (ref 36.0–46.0)
HEMOGLOBIN: 13.2 g/dL (ref 12.0–15.0)
MCH: 29.7 pg (ref 26.0–34.0)
MCHC: 32.8 g/dL (ref 30.0–36.0)
MCV: 90.3 fL (ref 78.0–100.0)
PLATELETS: 198 10*3/uL (ref 150–400)
RBC: 4.45 MIL/uL (ref 3.87–5.11)
RDW: 13.9 % (ref 11.5–15.5)
WBC: 7.4 10*3/uL (ref 4.0–10.5)

## 2015-11-29 LAB — SURGICAL PCR SCREEN
MRSA, PCR: NEGATIVE
Staphylococcus aureus: NEGATIVE

## 2015-11-29 MED ORDER — IPRATROPIUM-ALBUTEROL 0.5-2.5 (3) MG/3ML IN SOLN: 3.0000 mL | Freq: Three times a day (TID) | RESPIRATORY_TRACT | 11 refills | Status: DC

## 2015-11-29 NOTE — Telephone Encounter (Signed)
Maui from [facility] calling needing verification for Rx sent 11/20/15 for Pulmicort.  Rx states 62m by nebulizer TID.  Per Medicare part B they will only cover BID.  Need a new Rx for this dose.  Please advise on Medication change and also Maui states that OLawtellnotes will need to be addended to support this change as well.   Please advise Dr NLenna Gilford Thanks.     Medication List       Accurate as of 11/29/15  9:18 AM. Always use your most recent med list.          albuterol (2.5 MG/3ML) 0.083% nebulizer solution Commonly known as:  PROVENTIL Take 3 mLs (2.5 mg total) by nebulization 3 (three) times daily.   albuterol 108 (90 Base) MCG/ACT inhaler Commonly known as:  PROVENTIL HFA;VENTOLIN HFA Inhale 1 puff into the lungs every 6 (six) hours as needed for wheezing.   alendronate 70 MG tablet Commonly known as:  FOSAMAX Take 70 mg by mouth once a week. Saturdays.   ALPRAZolam 0.5 MG tablet Commonly known as:  XANAX Take 0.5 tablets (0.25 mg total) by mouth 2 (two) times daily as needed for anxiety.   budesonide 0.25 MG/2ML nebulizer solution Commonly known as:  PULMICORT Take 2 mLs (0.25 mg total) by nebulization 3 (three) times daily.   calcitonin (salmon) 200 UNIT/ACT nasal spray Commonly known as:  MIACALCIN/FORTICAL Place 1 spray into alternate nostrils daily.   DEXILANT 60 MG capsule Generic drug:  dexlansoprazole Take 60 mg by mouth daily as needed (for acid reflux.).   gabapentin 100 MG capsule Commonly known as:  NEURONTIN Take 100 mg by mouth daily.   HYDROcodone-acetaminophen 5-325 MG tablet Commonly known as:  NORCO Take 1 tablet by mouth every 6 (six) hours as needed.   levothyroxine 125 MCG tablet Commonly known as:  SYNTHROID, LEVOTHROID Take 125 mcg by mouth daily before breakfast.   OXYGEN Inhale into the lungs. 3 lpm with exertion   Vitamin D3 400 units Caps Take 1 tablet by mouth 2 (two) times daily.      Allergies  Allergen Reactions   . Latex Dermatitis  . Codeine Nausea Only

## 2015-11-29 NOTE — Pre-Procedure Instructions (Signed)
    Dawn Mercer  11/29/2015    Your procedure is scheduled on Thursday, October 12.  Report to [facility] Admitting at 6:30 AM               Your surgery or procedure is scheduled for 8:30 AM   Call this number if you have problems the morning of surgery:[phone]                  For any other questions, please call [phone], Monday - Friday 8 AM - 4 PM.   Remember:  Do not eat food or drink liquids after midnight Wednesday, October 10.  Take these medicines the morning of surgery with A SIP OF WATER:gabapentin (NEURONTIN), levothyroxine (SYNTHROID, LEVOTHROID).                Use Pulmicort Nebulizer. Use Albuterol nebulizer or Inhaler if needed- please bring Albuterol Inhaler to the hospital with you.               Take if needed:dexlansoprazole ([name]), ALPRAZolam [name]).   Do not wear jewelry, make-up or nail polish.  Do not wear lotions, powders, or perfumes, or deoderant.  Do not shave 48 hours prior to surgery.    Do not bring valuables to the hospital.  [facility] is not responsible for any belongings or valuables.  Contacts, dentures or bridgework may not be worn into surgery.  Leave your suitcase in the car.  After surgery it may be brought to your room.  For patients admitted to the hospital, discharge time will be determined by your treatment team.  Patients discharged the day of surgery will not be allowed to drive home.  Special instructions:  Review  [facility] - Preparing For Surgery.  Please read over the following fact sheets that you were given: [facility]- Preparing For Surgery and Patient Instructions for Mupirocin Application, Coughing and Deep Breathing, Pain Booklet

## 2015-11-29 NOTE — Pre-Procedure Instructions (Signed)
    Dawn Mercer  11/29/2015    Your procedure is scheduled on Wednesday,  October 11.  Report to [facility] Admitting at 6:30 AM               Your surgery or procedure is scheduled for 8:30 AM   Call this number if you have problems the morning of [phone]                  For any other questions, please call [phone], Monday - Friday 8 AM - 4 PM.   Remember:  Do not eat food or drink liquids after midnight Tuesday, October 10.  Take these medicines the morning of surgery with A SIP OF WATER:gabapentin (NEURONTIN), levothyroxine (SYNTHROID, LEVOTHROID).                Use Pulmicort Nebulizer. Use Albuterol nebulizer or Inhaler if needed- please bring Albuterol Inhaler to the hospital with you.               Take if needed:dexlansoprazole ([place]), ALPRAZolam [name]).   Do not wear jewelry, make-up or nail polish.  Do not wear lotions, powders, or perfumes, or deoderant.  Do not shave 48 hours prior to surgery.    Do not bring valuables to the hospital.  [facility] is not responsible for any belongings or valuables.  Contacts, dentures or bridgework may not be worn into surgery.  Leave your suitcase in the car.  After surgery it may be brought to your room.  For patients admitted to the hospital, discharge time will be determined by your treatment team.  Patients discharged the day of surgery will not be allowed to drive home.  Special instructions:  Review  [place] - Preparing For Surgery.  Please read over the following fact sheets that you were given: [facility]- Preparing For Surgery and Patient Instructions for Mupirocin Application, Coughing and Deep Breathing, Pain Booklet

## 2015-11-29 NOTE — Telephone Encounter (Signed)
Per SN--the albuterol needs to be cancelled and replaced with duoneb.  This has been completed.  I have called the pharmacy and they advised that the insurance will NOT cover the budesonide TID, but will cover it for BID.  SN wanted the pt to stay on the budesonide TID, but the insurance will not cover it for TID.

## 2015-11-30 NOTE — Progress Notes (Signed)
Anesthesia PAT Evaluation: Patient is a 70 year old female scheduled for kyphoplasty on 12/06/15 by Dr. Rolena Infante. DX: T5 compression fracture. She has known stage III lung cancer with recent PET that showed "no clear evidence for disease recurrence but there was questionable lesion at T5 vertebral body concerning for osteopenia from prior radiation versus bone metastasis." He recommended an MRI that confirmed recent compression fracture with moderate height loss (this was favored over pathologic fracture, although edema could obscure underlying bone lesion so 3 month follow-up recommended). I evaluated patient during her PAT visit on 11/29/15.  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  - PCP is Dr. Myriam Jacobson. - HEM-ONC is Dr. Julien Nordmann. - Pulmonologist is Dr. Teressa Lower, last visit 11/20/15 for follow-up and preoperative pulmonary evaluation. In his impression/plan, he writes "she is OK for surgery." His note mentions she has mixed chronic bronchitis and emphysema with poor compliance (In talking with her, I don't think she understood that her pulmonary regimen was not just to treat acute symptoms but to help with long-term control and progression. We did discuss this.). He is recommending she switch from albuterol to Duoneb nebulizer and add Budesonide TID, continue home O2 and bump up to 3L with exercise if necessary.   Meds include albuterol HFA and nebulizer, Fosamax, Xanax, Pulmicort (not delivered yet), calcitonin, vitamin D3, Dexilant, Neurontin, Norco, levothyroxine, Duobeb (albuterol nebulizer to be discontinued and  replaced with Duoneb once delivered). Currently she is only using albuterol as needed.   BP 117/74   Pulse 97   Temp 36.7 C   Resp (!) 22   Ht 5' 5.5" (1.664 m)   Wt 192 lb 1.6 oz (87.1 kg)   SpO2 95%   BMI 31.48 kg/m   Exam shows a pleasant white female in NAD. She is in a wheelchair, wearing O2/[place]. Husband at side. Heart RRR, no murmur noted. Lungs sounds diminished but clear without wheezes, rhonchi or crackles noted. No pedal edema. She denied SOB at rest. Activity is limited by her breathing, but does not "pant" when walking in her house now that she is on oxygen. She was started on home O2 at 2L/[place] ~ 3 1/2 months ago. Pain medications help with her back pain. She is able to do some cooking. She denied prior cardiac testing.    08/17/15 EKG: SR.  08/17/15 CXR: IMPRESSION: No acute cardiopulmonary process. Post radiation changes right upper lobe.  08/17/15 CTA Chest: IMPRESSION: 1. No evidence for acute pulmonary embolus. 2. Increased in right perihilar soft tissue and right hilar adenopathy. Suspicious for progression of disease. Additionally, there is increased and pleural thickening overlying the right lung. Findings may reflect pleural spread of tumor. 3. Advanced changes of emphysema.  09/25/15 MRI T-spine: IMPRESSION: Recent T5 compression fracture with moderate height loss. Features and chest CT 08/17/2015 favor insufficiency over pathologic fracture. Expected enhancement and edema could obscure an underlying bone lesion; preferably would obtain MRI follow-up in 3 months to confirm expected evolution.  05/31/15 Spirometry: FVC=1.35 (43%), FEV1=0.88 (36%), %1sec=65, mid-flows reduced at 16% predicted => c/w mod airflow obstruction and GOLD Stage3 COPD.  Preoperative labs noted.  She had recent pulmonology visit with pre-operative clearance. She continues to feel at her baseline from  a pulmonary standpoint. I recommended she follow-up with Dr. Daneil Dolin office by 12/01/15 if she had  not received her new pulmonology meds (insurance coverage was still being worked out as of 11/28/15). She was advised to take pulmonary meds on the morning of surgery and let Dr. Rolena Infante (and Dr. Lenna Gilford if needed) if any changes between now and her surgery date since we need her at her baseline for her surgery.  George Hugh [facility] Short Stay Center/Anesthesiology Phone [phone] 11/30/2015 1:34 PM

## 2015-12-01 NOTE — Telephone Encounter (Signed)
OV note has been completed by SN and nothing further is needed.

## 2015-12-01 NOTE — Telephone Encounter (Signed)
Pt's insurance will NOT cover the budesonide three times daily, but will cover for BID.  SN please advise if okay for Budesonide to be changed to BID.  Thanks.

## 2015-12-01 NOTE — Telephone Encounter (Signed)
Called and spoke with [facility] and she will get this changed and sent out to the pharmacy.  The pharmacy will need the last OV note updated with this change and faxed to them at [phone]

## 2015-12-05 ENCOUNTER — Encounter (HOSPITAL_COMMUNITY): Payer: Self-pay | Admitting: Anesthesiology

## 2015-12-05 NOTE — Anesthesia Preprocedure Evaluation (Addendum)
Anesthesia Evaluation  Patient identified by MRN, date of birth, ID band Patient awake    Reviewed: Allergy & Precautions, NPO status , Patient's Chart, lab work & pertinent test results  Airway Mallampati: III  TM Distance: >3 FB Neck ROM: Full    Dental  (+) Lower Dentures, Upper Dentures   Pulmonary shortness of breath and with exertion, pneumonia, resolved, COPD,  COPD inhaler and oxygen dependent, former smoker,  Hx/o lung ca O2 dependent    Pulmonary exam normal        Cardiovascular Normal cardiovascular exam Rhythm:Regular Rate:Normal     Neuro/Psych Seizures -, Well Controlled,  Anxiety Last seizure 40 yrs ago Peripheral neuropathy- chemo induced     GI/Hepatic Neg liver ROS, hiatal hernia, GERD  Medicated and Controlled,  Endo/Other  Hypothyroidism Obesity   Renal/GU negative Renal ROS  negative genitourinary   Musculoskeletal T5 compression Fx    Abdominal (+) + obese,   Peds  Hematology negative hematology ROS (+)   Anesthesia Other Findings   Reproductive/Obstetrics Hx/ VIN III                            Anesthesia Physical Anesthesia Plan  ASA: III  Anesthesia Plan: General   Post-op Pain Management:    Induction: Intravenous  Airway Management Planned: Oral ETT  Additional Equipment:   Intra-op Plan:   Post-operative Plan: Extubation in OR  Informed Consent: I have reviewed the patients History and Physical, chart, labs and discussed the procedure including the risks, benefits and alternatives for the proposed anesthesia with the patient or authorized representative who has indicated his/her understanding and acceptance.   Dental advisory given  Plan Discussed with:   Anesthesia Plan Comments:         Anesthesia Quick Evaluation

## 2015-12-06 ENCOUNTER — Observation Stay (HOSPITAL_COMMUNITY)
Admission: RE | Admit: 2015-12-06 | Discharge: 2015-12-07 | Disposition: A | Discharge status: Home / Self Care | Payer: Medicare Other | Source: Ambulatory Visit | Attending: Orthopedic Surgery | Admitting: Orthopedic Surgery

## 2015-12-06 ENCOUNTER — Ambulatory Visit (HOSPITAL_COMMUNITY): Payer: Medicare Other | Admitting: Vascular Surgery

## 2015-12-06 ENCOUNTER — Encounter (HOSPITAL_COMMUNITY)
Admission: RE | Disposition: A | Discharge status: Home / Self Care | Payer: Self-pay | Source: Ambulatory Visit | Attending: Orthopedic Surgery

## 2015-12-06 ENCOUNTER — Ambulatory Visit (HOSPITAL_COMMUNITY): Payer: Medicare Other | Admitting: Certified Registered Nurse Anesthetist

## 2015-12-06 ENCOUNTER — Encounter (HOSPITAL_COMMUNITY): Payer: Self-pay | Admitting: Urology

## 2015-12-06 ENCOUNTER — Observation Stay (HOSPITAL_COMMUNITY): Payer: Medicare Other

## 2015-12-06 ENCOUNTER — Ambulatory Visit (HOSPITAL_COMMUNITY): Payer: Medicare Other

## 2015-12-06 DIAGNOSIS — Z9221 Personal history of antineoplastic chemotherapy: Secondary | ICD-10-CM | POA: Diagnosis not present

## 2015-12-06 DIAGNOSIS — E039 Hypothyroidism, unspecified: Secondary | ICD-10-CM | POA: Diagnosis not present

## 2015-12-06 DIAGNOSIS — R569 Unspecified convulsions: Secondary | ICD-10-CM | POA: Insufficient documentation

## 2015-12-06 DIAGNOSIS — K449 Diaphragmatic hernia without obstruction or gangrene: Secondary | ICD-10-CM | POA: Insufficient documentation

## 2015-12-06 DIAGNOSIS — Z419 Encounter for procedure for purposes other than remedying health state, unspecified: Secondary | ICD-10-CM

## 2015-12-06 DIAGNOSIS — J449 Chronic obstructive pulmonary disease, unspecified: Secondary | ICD-10-CM | POA: Diagnosis not present

## 2015-12-06 DIAGNOSIS — Z85118 Personal history of other malignant neoplasm of bronchus and lung: Secondary | ICD-10-CM | POA: Insufficient documentation

## 2015-12-06 DIAGNOSIS — Z91040 Latex allergy status: Secondary | ICD-10-CM | POA: Diagnosis not present

## 2015-12-06 DIAGNOSIS — F419 Anxiety disorder, unspecified: Secondary | ICD-10-CM | POA: Insufficient documentation

## 2015-12-06 DIAGNOSIS — M4854XA Collapsed vertebra, not elsewhere classified, thoracic region, initial encounter for fracture: Secondary | ICD-10-CM | POA: Diagnosis not present

## 2015-12-06 DIAGNOSIS — E669 Obesity, unspecified: Secondary | ICD-10-CM | POA: Diagnosis not present

## 2015-12-06 DIAGNOSIS — I700 Atherosclerosis of aorta: Secondary | ICD-10-CM | POA: Diagnosis not present

## 2015-12-06 DIAGNOSIS — S22000A Wedge compression fracture of unspecified thoracic vertebra, initial encounter for closed fracture: Secondary | ICD-10-CM | POA: Diagnosis present

## 2015-12-06 DIAGNOSIS — Z8541 Personal history of malignant neoplasm of cervix uteri: Secondary | ICD-10-CM | POA: Diagnosis not present

## 2015-12-06 DIAGNOSIS — F40240 Claustrophobia: Secondary | ICD-10-CM | POA: Diagnosis not present

## 2015-12-06 DIAGNOSIS — Z885 Allergy status to narcotic agent status: Secondary | ICD-10-CM | POA: Insufficient documentation

## 2015-12-06 DIAGNOSIS — Z6831 Body mass index (BMI) 31.0-31.9, adult: Secondary | ICD-10-CM | POA: Insufficient documentation

## 2015-12-06 DIAGNOSIS — Z923 Personal history of irradiation: Secondary | ICD-10-CM | POA: Insufficient documentation

## 2015-12-06 DIAGNOSIS — J939 Pneumothorax, unspecified: Secondary | ICD-10-CM

## 2015-12-06 DIAGNOSIS — K219 Gastro-esophageal reflux disease without esophagitis: Secondary | ICD-10-CM | POA: Diagnosis not present

## 2015-12-06 DIAGNOSIS — Z87891 Personal history of nicotine dependence: Secondary | ICD-10-CM | POA: Insufficient documentation

## 2015-12-06 DIAGNOSIS — K589 Irritable bowel syndrome without diarrhea: Secondary | ICD-10-CM | POA: Diagnosis not present

## 2015-12-06 HISTORY — PX: KYPHOPLASTY: SHX5884

## 2015-12-06 SURGERY — KYPHOPLASTY
Anesthesia: General

## 2015-12-06 MED ORDER — LIDOCAINE HCL (CARDIAC) 20 MG/ML IV SOLN
INTRAVENOUS | Status: DC | PRN
  Administered 2015-12-06: 50 mg via INTRAVENOUS

## 2015-12-06 MED ORDER — BUPIVACAINE-EPINEPHRINE 0.25% -1:200000 IJ SOLN
INTRAMUSCULAR | Status: AC
  Filled 2015-12-06: qty 1

## 2015-12-06 MED ORDER — IOPAMIDOL (ISOVUE-300) INJECTION 61%
INTRAVENOUS | Status: AC
  Filled 2015-12-06: qty 50

## 2015-12-06 MED ORDER — PROPOFOL 10 MG/ML IV BOLUS
INTRAVENOUS | Status: AC
  Filled 2015-12-06: qty 20

## 2015-12-06 MED ORDER — METHOCARBAMOL 1000 MG/10ML IJ SOLN
500.0000 mg | Freq: Four times a day (QID) | INTRAVENOUS | Status: DC | PRN
  Filled 2015-12-06: qty 5

## 2015-12-06 MED ORDER — IOPAMIDOL (ISOVUE-300) INJECTION 61%
INTRAVENOUS | Status: DC | PRN
  Administered 2015-12-06: 50 mL via INTRAVENOUS

## 2015-12-06 MED ORDER — HYDROCODONE-ACETAMINOPHEN 5-325 MG PO TABS
1.0000 | ORAL_TABLET | Freq: Four times a day (QID) | ORAL | Status: DC | PRN
  Administered 2015-12-06 (×2): 1 via ORAL
  Filled 2015-12-06 (×2): qty 1

## 2015-12-06 MED ORDER — BUPIVACAINE-EPINEPHRINE 0.25% -1:200000 IJ SOLN
INTRAMUSCULAR | Status: DC | PRN
Start: 2015-12-06 — End: 2015-12-06
  Administered 2015-12-06: 5 mg

## 2015-12-06 MED ORDER — SUGAMMADEX SODIUM 200 MG/2ML IV SOLN
INTRAVENOUS | Status: DC | PRN
  Administered 2015-12-06: 200 mg via INTRAVENOUS

## 2015-12-06 MED ORDER — MIDAZOLAM HCL 2 MG/2ML IJ SOLN
INTRAMUSCULAR | Status: AC
  Filled 2015-12-06: qty 2

## 2015-12-06 MED ORDER — EPHEDRINE SULFATE 50 MG/ML IJ SOLN
INTRAMUSCULAR | Status: DC | PRN
  Administered 2015-12-06: 10 mg via INTRAVENOUS

## 2015-12-06 MED ORDER — CEFAZOLIN IN D5W 1 GM/50ML IV SOLN
1.0000 g | Freq: Three times a day (TID) | INTRAVENOUS | Status: AC
  Administered 2015-12-06 (×2): 1 g via INTRAVENOUS
  Filled 2015-12-06 (×2): qty 50

## 2015-12-06 MED ORDER — MENTHOL 3 MG MT LOZG: 1.0000 | LOZENGE | OROMUCOSAL | Status: DC | PRN

## 2015-12-06 MED ORDER — ONDANSETRON HCL 4 MG/2ML IJ SOLN
INTRAMUSCULAR | Status: AC
  Filled 2015-12-06: qty 2

## 2015-12-06 MED ORDER — METOCLOPRAMIDE HCL 5 MG/ML IJ SOLN: 10.0000 mg | Freq: Once | INTRAMUSCULAR | Status: DC | PRN

## 2015-12-06 MED ORDER — FENTANYL CITRATE (PF) 100 MCG/2ML IJ SOLN
INTRAMUSCULAR | Status: AC
  Filled 2015-12-06: qty 2

## 2015-12-06 MED ORDER — ROCURONIUM BROMIDE 100 MG/10ML IV SOLN
INTRAVENOUS | Status: DC | PRN
  Administered 2015-12-06: 10 mg via INTRAVENOUS
  Administered 2015-12-06: 30 mg via INTRAVENOUS

## 2015-12-06 MED ORDER — PHENOL 1.4 % MT LIQD: 1.0000 | OROMUCOSAL | Status: DC | PRN

## 2015-12-06 MED ORDER — SODIUM CHLORIDE 0.9% FLUSH: 3.0000 mL | INTRAVENOUS | Status: DC | PRN

## 2015-12-06 MED ORDER — FENTANYL CITRATE (PF) 100 MCG/2ML IJ SOLN
INTRAMUSCULAR | Status: DC | PRN
  Administered 2015-12-06 (×3): 50 ug via INTRAVENOUS

## 2015-12-06 MED ORDER — METHOCARBAMOL 500 MG PO TABS: 500.0000 mg | ORAL_TABLET | Freq: Four times a day (QID) | ORAL | Status: DC | PRN

## 2015-12-06 MED ORDER — PROPOFOL 10 MG/ML IV BOLUS
INTRAVENOUS | Status: DC | PRN
  Administered 2015-12-06: 100 mg via INTRAVENOUS

## 2015-12-06 MED ORDER — FENTANYL CITRATE (PF) 100 MCG/2ML IJ SOLN
25.0000 ug | INTRAMUSCULAR | Status: DC | PRN
  Administered 2015-12-06: 50 ug via INTRAVENOUS

## 2015-12-06 MED ORDER — CEFAZOLIN SODIUM-DEXTROSE 2-4 GM/100ML-% IV SOLN
2.0000 g | INTRAVENOUS | Status: AC
  Administered 2015-12-06: 2 g via INTRAVENOUS
  Filled 2015-12-06: qty 100

## 2015-12-06 MED ORDER — LACTATED RINGERS IV SOLN: INTRAVENOUS | Status: DC

## 2015-12-06 MED ORDER — ONDANSETRON HCL 4 MG/2ML IJ SOLN
INTRAMUSCULAR | Status: DC | PRN
  Administered 2015-12-06: 4 mg via INTRAVENOUS

## 2015-12-06 MED ORDER — MEPERIDINE HCL 25 MG/ML IJ SOLN: 6.2500 mg | INTRAMUSCULAR | Status: DC | PRN

## 2015-12-06 MED ORDER — SUCCINYLCHOLINE CHLORIDE 20 MG/ML IJ SOLN
INTRAMUSCULAR | Status: DC | PRN
  Administered 2015-12-06: 100 mg via INTRAVENOUS

## 2015-12-06 MED ORDER — LACTATED RINGERS IV SOLN
INTRAVENOUS | Status: DC | PRN
  Administered 2015-12-06: 08:00:00 via INTRAVENOUS

## 2015-12-06 MED ORDER — FENTANYL CITRATE (PF) 100 MCG/2ML IJ SOLN
INTRAMUSCULAR | Status: AC
  Filled 2015-12-06: qty 4

## 2015-12-06 MED ORDER — 0.9 % SODIUM CHLORIDE (POUR BTL) OPTIME
TOPICAL | Status: DC | PRN
  Administered 2015-12-06: 1000 mL

## 2015-12-06 MED ORDER — DEXAMETHASONE SODIUM PHOSPHATE 10 MG/ML IJ SOLN
INTRAMUSCULAR | Status: AC
  Filled 2015-12-06: qty 1

## 2015-12-06 MED ORDER — MORPHINE SULFATE (PF) 2 MG/ML IV SOLN
1.0000 mg | INTRAVENOUS | Status: DC | PRN
  Administered 2015-12-06: 2 mg via INTRAVENOUS
  Filled 2015-12-06: qty 1

## 2015-12-06 MED ORDER — ALBUTEROL SULFATE (2.5 MG/3ML) 0.083% IN NEBU
2.5000 mg | INHALATION_SOLUTION | Freq: Once | RESPIRATORY_TRACT | Status: AC
  Administered 2015-12-06: 2.5 mg via RESPIRATORY_TRACT
  Filled 2015-12-06: qty 3

## 2015-12-06 MED ORDER — PHENYLEPHRINE HCL 10 MG/ML IJ SOLN
INTRAMUSCULAR | Status: DC | PRN
  Administered 2015-12-06: 120 ug via INTRAVENOUS
  Administered 2015-12-06: 80 ug via INTRAVENOUS
  Administered 2015-12-06: 60 ug via INTRAVENOUS

## 2015-12-06 MED ORDER — ONDANSETRON HCL 4 MG/2ML IJ SOLN: 4.0000 mg | INTRAMUSCULAR | Status: DC | PRN

## 2015-12-06 MED ORDER — PHENYLEPHRINE 40 MCG/ML (10ML) SYRINGE FOR IV PUSH (FOR BLOOD PRESSURE SUPPORT)
PREFILLED_SYRINGE | INTRAVENOUS | Status: AC
  Filled 2015-12-06: qty 10

## 2015-12-06 MED ORDER — SODIUM CHLORIDE 0.9% FLUSH: 3.0000 mL | Freq: Two times a day (BID) | INTRAVENOUS | Status: DC

## 2015-12-06 MED ORDER — DEXAMETHASONE SODIUM PHOSPHATE 10 MG/ML IJ SOLN
INTRAMUSCULAR | Status: DC | PRN
  Administered 2015-12-06: 10 mg via INTRAVENOUS

## 2015-12-06 SURGICAL SUPPLY — 47 items
BANDAGE ADH SHEER 1  50/CT (GAUZE/BANDAGES/DRESSINGS) ×3 IMPLANT
BLADE SURG 15 STRL LF DISP TIS (BLADE) ×1 IMPLANT
BLADE SURG 15 STRL SS (BLADE) ×3
BONE FILLER DEVICE STRL SZ3 (INSTRUMENTS) ×2 IMPLANT
CEMENT KYPHON C01A KIT/MIXER (Cement) ×3 IMPLANT
COVER MAYO STAND STRL (DRAPES) ×3 IMPLANT
CURETTE EXPRESS SZ2 7MM (INSTRUMENTS) IMPLANT
CURETTE WEDGE 8.5MM KYPHX (MISCELLANEOUS) IMPLANT
CURRETTE EXPRESS SZ2 7MM (INSTRUMENTS) ×3
DRAPE C-ARM 42X72 X-RAY (DRAPES) ×6 IMPLANT
DRAPE INCISE IOBAN 66X45 STRL (DRAPES) ×3 IMPLANT
DRAPE LAPAROTOMY T 102X78X121 (DRAPES) ×3 IMPLANT
DRAPE PROXIMA HALF (DRAPES) ×6 IMPLANT
DRSG AQUACEL AG ADV 3.5X10 (GAUZE/BANDAGES/DRESSINGS) ×3 IMPLANT
DURAPREP 26ML APPLICATOR (WOUND CARE) ×3 IMPLANT
ELECT PENCIL ROCKER SW 15FT (MISCELLANEOUS) ×3 IMPLANT
GAUZE SPONGE 4X4 16PLY XRAY LF (GAUZE/BANDAGES/DRESSINGS) ×3 IMPLANT
GLOVE BIO SURGEON STRL SZ 6.5 (GLOVE) ×2 IMPLANT
GLOVE BIO SURGEONS STRL SZ 6.5 (GLOVE) ×1
GLOVE BIOGEL PI IND STRL 6.5 (GLOVE) ×1 IMPLANT
GLOVE BIOGEL PI INDICATOR 6.5 (GLOVE) ×2
GLOVE SS BIOGEL STRL SZ 8.5 (GLOVE) ×2 IMPLANT
GLOVE SUPERSENSE BIOGEL SZ 8.5 (GLOVE) ×4
GOWN STRL REUS W/ TWL LRG LVL3 (GOWN DISPOSABLE) ×1 IMPLANT
GOWN STRL REUS W/TWL 2XL LVL3 (GOWN DISPOSABLE) ×6 IMPLANT
GOWN STRL REUS W/TWL LRG LVL3 (GOWN DISPOSABLE) ×3
KIT BASIN OR (CUSTOM PROCEDURE TRAY) ×3 IMPLANT
KIT ROOM TURNOVER OR (KITS) ×3 IMPLANT
NDL HYPO 25X1 1.5 SAFETY (NEEDLE) ×1 IMPLANT
NDL SPNL 18GX3.5 QUINCKE PK (NEEDLE) ×2 IMPLANT
NEEDLE HYPO 25X1 1.5 SAFETY (NEEDLE) ×3 IMPLANT
NEEDLE SPNL 18GX3.5 QUINCKE PK (NEEDLE) ×6 IMPLANT
NS IRRIG 1000ML POUR BTL (IV SOLUTION) ×3 IMPLANT
PACK SURGICAL SETUP 50X90 (CUSTOM PROCEDURE TRAY) ×3 IMPLANT
PACK UNIVERSAL I (CUSTOM PROCEDURE TRAY) ×3 IMPLANT
PAD ARMBOARD 7.5X6 YLW CONV (MISCELLANEOUS) ×6 IMPLANT
SURGIFLO W/THROMBIN 8M KIT (HEMOSTASIS) IMPLANT
SUT BONE WAX W31G (SUTURE) ×3 IMPLANT
SUT MON AB 3-0 SH 27 (SUTURE) ×3
SUT MON AB 3-0 SH27 (SUTURE) ×1 IMPLANT
SYR CONTROL 10ML LL (SYRINGE) ×3 IMPLANT
TOWEL OR 17X24 6PK STRL BLUE (TOWEL DISPOSABLE) ×3 IMPLANT
TOWEL OR 17X26 10 PK STRL BLUE (TOWEL DISPOSABLE) ×3 IMPLANT
TRAY KYPHOPAK 15/2 EXPRESS (KITS) ×2 IMPLANT
TRAY KYPHOPAK 15/3 ONESTEP 1ST (MISCELLANEOUS) IMPLANT
TRAY KYPHOPAK 20/3 ONESTEP 1ST (MISCELLANEOUS) ×3 IMPLANT
WATER STERILE IRR 1000ML POUR (IV SOLUTION) ×1 IMPLANT

## 2015-12-06 NOTE — H&P (Signed)
History of Present Illness  The patient is a 70 year old female who comes in today for a preoperative History and Physical. The patient is scheduled for a T5 kyphoplasty to be performed by Dr. Duane Lope D. Rolena Infante, MD at [facility] on 12-06-15 . Please see the hospital record for complete dictated history and physical. Pt has Chronic COPD and is on O2 in clinic.  Additional reasons for visit:  Transition into care is described as the following: The patient is transitioning into care and a summary of care was reviewed.   Problem List/Past Medical  Problems Reconciled  Other closed fracture of fifth thoracic vertebra, initial encounter (S22.058A)  Thoracic spine pain (M54.6)   Allergies Latex  Rash. Codeine and Related  Nausea. Allergies Reconciled   Family History  Cancer  Brother. Congestive Heart Failure  Father. Heart Disease  Father.  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 11/10/2015: smoke(d) 1 pack(s) per day Under pain contract   Medication History  Calcitonin ([place]) (200UNIT/ACT Solution, 1 (one) Spray Nasal 1 spray in nostril qd, rotating nostrils each day, Taken starting 11/10/2015) Active. (rx sent to [facility] [place] per ddb/smt 11/10/15) Hydrocodone-Acetaminophen (5-'325MG'$  Tablet, Oral) Active. (Rx'd by Dr Sondra Come) ALPRAZolam (0.'5MG'$  Tablet, Oral) Active. (Rx'd by Dr Sondra Come prn) Dexilant ('60MG'$  Capsule DR, Oral) Active. (prn) Synthroid (125MCG Tablet, Oral) Active. (qd) ProAir HFA (108 (90 Base)MCG/ACT Aerosol Soln, Inhalation) Active. (prn) Vitamin D (1000UNIT Tablet, Oral) Active. (qd) Alendronate Sodium ('70MG'$   Tablet, Oral) Inactive. (qd) Medications Reconciled  Past Surgical History  Colon Polyp Removal - Colonoscopy   Other Problems  Anxiety Disorder  Chronic Obstructive Lung Disease  Gastroesophageal Reflux Disease  Lung Cancer  Osteoporosis   Vitals  11/28/2015 2:01 PM Weight: 194 lb Height: 64.5in Body Surface Area: 1.94 m Body Mass Index: 32.79 kg/m  Temp.: 97.22F(Oral)  Pulse: 113 (Regular)  BP: 117/79 (Sitting, Left Arm, Standard)  General General Appearance-Not in acute distress. Orientation-Oriented X3. Build & Nutrition-Well nourished and Well developed.  Integumentary General Characteristics Surgical Scars - no surgical scar evidence of previous lumbar surgery. Lumbar Spine-Skin examination of the lumbar spine is without deformity, skin lesions, lacerations or abrasions.  Chest and Lung Exam Auscultation Breath sounds - Rattling - Both Lung Fields. Prolonged expiration - Both Lung Fields.  Abdomen Palpation/Percussion Palpation and Percussion of the abdomen reveal - Soft, Non Tender and No Rebound tenderness.  Peripheral Vascular Lower Extremity Palpation - Posterior tibial pulse - Bilateral - 2+. Dorsalis pedis pulse - Bilateral - 2+.  Neurologic Sensation Lower Extremity - Bilateral - sensation is intact in the lower extremity. Reflexes Patellar Reflex - Bilateral - 2+. Achilles Reflex - Bilateral - 2+. Clonus - Bilateral - clonus not present. Hoffman's Sign - Bilateral - Hoffman's sign not present. Testing Seated Straight Leg Raise - Bilateral - Seated straight leg raise negative.  Musculoskeletal Spine/Ribs/Pelvis  Lumbosacral Spine: Inspection and Palpation - Tenderness - left thoracic paraspinals tender to palpation and right thoracic paraspinals tender to palpation. Strength and Tone: Strength - Hip Flexion - Bilateral - 5/5. Knee Extension - Bilateral - 5/5. Knee Flexion - Bilateral - 5/5. Ankle Dorsiflexion - Bilateral  - 5/5. Ankle Plantarflexion -  Bilateral - 5/5. Heel walk - Bilateral - able to heel walk without difficulty. Toe Walk - Bilateral - able to walk on toes without difficulty. Heel-Toe Walk - Bilateral - able to heel-toe walk without difficulty. ROM - Flexion - moderately decreased range of motion and painful. Extension - moderately decreased range of motion and painful. Left Lateral Bending - moderately decreased range of motion and painful. Right Lateral Bending - moderately decreased range of motion and painful. Right Rotation - moderately decreased range of motion and painful. Left Rotation - moderately decreased range of motion and painful. Pain - . Lumbosacral Spine - Waddell's Signs - no Waddell's signs present. Lower Extremity Range of Motion - No true hip, knee or ankle pain with range of motion. Gait and Station - [employer] - no assistive devices.  RADIOGRAPHS X-rays today in my office demonstrate the compression fracture at T5. It does not appear to be significantly different when compared with the MRI of July. She has shortness of breath with exertion. She has no active cough. She is neurologically intact. No focal motor or sensory deficits in the upper or lower extremity. She has localized pain to the mid scapular region in the midline consistent with a T5 level. No radiation of the pain into the lumbar spine.  Assessment & Plan Goal Of Surgery: Discussed that goal of surgery is to reduce pain and improve function and quality of life. Patient is aware that despite all appropriate treatment that there pain and function could be the same, worse, or different. At this point in time, the patient has a T5 compression fracture. We have talked about a kyphoplasty versus conservative care.  Patient has decompensated over the past several weeks and expressed desire to move forward with surgery.  Risks reviewed: infection, bleeding, leak of cement, death, stroke, paralysis, nerve damage, and ongoing  or worse pain.

## 2015-12-06 NOTE — Evaluation (Signed)
Physical Therapy Evaluation Patient Details Name: Dawn Mercer MRN: [id number] DOB: 1945-12-14 Today's Date: 12/06/2015   History of Present Illness  Pt presents for kyphoplasty T5 following compression fx and prolonged pain. PMH: lung cancer   Clinical Impression  Patient evaluated by Physical Therapy with no further acute PT needs identified. All education has been completed and the patient has no further questions. Pt mod I with mobility, reviewed safe positioning and activity level, handout given.  See below for any follow-up Physical Therapy or equipment needs. PT is signing off. Thank you for this referral.     Follow Up Recommendations No PT follow up    Equipment Recommendations  None recommended by PT    Recommendations for Other Services       Precautions / Restrictions Precautions Precautions: None Precaution Comments: reviewed back precautions as guidelines for healthy back and gave handout Restrictions Weight Bearing Restrictions: No      Mobility  Bed Mobility Overal bed mobility: Modified Independent             General bed mobility comments: reviewed safety with getting out of bed and pt practiced effectively  Transfers Overall transfer level: Modified independent Equipment used: None                Ambulation/Gait Ambulation/Gait assistance: Modified independent (Device/Increase time) Ambulation Distance (Feet): 75 Feet Assistive device: None Gait Pattern/deviations: WFL(Within Functional Limits) Gait velocity: WFL Gait velocity interpretation: at or above normal speed for age/gender General Gait Details: no deviations noted. Pt had recently taken a walk so distance limited. Remained on 2L O2, desat to upper 60's from 87% before ambulating, 2/4 DOE  Financial trader Rankin (Stroke Patients Only)       Balance Overall balance assessment: No apparent balance deficits (not formally  assessed)                                           Pertinent Vitals/Pain Pain Assessment: Faces Faces Pain Scale: Hurts little more Pain Location: left rib cage at T5 level Pain Descriptors / Indicators: Burning Pain Intervention(s): Monitored during session    Home Living Family/patient expects to be discharged to:: Private residence Living Arrangements: Spouse/significant other Available Help at Discharge: Family;Available PRN/intermittently Type of Home: House Home Access: Stairs to enter   [employer] of Steps: 4 Home Layout: One level Home Equipment:  (oxygen)      Prior Function Level of Independence: Needs assistance   Gait / Transfers Assistance Needed: independent  ADL's / Homemaking Assistance Needed: husband helps with household tasks as well as being present when pt bathes and helpes her fasten her bra        Hand Dominance        Extremity/Trunk Assessment   Upper Extremity Assessment: Overall WFL for tasks assessed           Lower Extremity Assessment: Overall WFL for tasks assessed      Cervical / Trunk Assessment: Normal  Communication   Communication: No difficulties  Cognition Arousal/Alertness: Awake/alert Behavior During Therapy: WFL for tasks assessed/performed Overall Cognitive Status: Within Functional Limits for tasks assessed                      General Comments General comments (skin integrity, edema,  etc.): discussed proper posture, activity level, and ADL's    Exercises     Assessment/Plan    PT Assessment Patent does not need any further PT services  PT Problem List            PT Treatment Interventions      PT Goals (Current goals can be found in the Care Plan section)  Acute Rehab PT Goals Patient Stated Goal: return home PT Goal Formulation: All assessment and education complete, DC therapy    Frequency     Barriers to discharge        Co-evaluation                End of Session   Activity Tolerance: Patient tolerated treatment well Patient left: in bed;with call bell/phone within reach;with family/visitor present Nurse Communication: Mobility status    Functional Assessment Tool Used: clinical judgement Functional Limitation: Mobility: Walking and moving around Mobility: Walking and Moving Around Current Status ([id number]): 0 percent impaired, limited or restricted Mobility: Walking and Moving Around Goal Status [phone]): 0 percent impaired, limited or restricted Mobility: Walking and Moving Around Discharge Status [phone]): 0 percent impaired, limited or restricted    Time: 1550-1619 PT Time Calculation (min) (ACUTE ONLY): 29 min   Charges:   PT Evaluation $PT Eval Moderate Complexity: 1 Procedure PT Treatments $Gait Training: 8-22 mins   PT G Codes:   PT G-Codes **NOT FOR INPATIENT CLASS** Functional Assessment Tool Used: clinical judgement Functional Limitation: Mobility: Walking and moving around Mobility: Walking and Moving Around Current Status (I6270): 0 percent impaired, limited or restricted Mobility: Walking and Moving Around Goal Status (J5009): 0 percent impaired, limited or restricted Mobility: Walking and Moving Around Discharge Status [phone]): 0 percent impaired, limited or restricted  Leighton Roach, PT  Acute Rehab Services  [place], [place] 12/06/2015, 4:27 PM

## 2015-12-06 NOTE — Progress Notes (Signed)
Pt refused to take dentures out until right before being taken back for surgery. Denture cup left with patient.

## 2015-12-06 NOTE — Progress Notes (Signed)
Pt O2 between 86-92 this AM. Pt states her O2 sat is 88% at home on 2Lnc and drops to 70s with movement. Pt states she forgets to breath through her nose. When pt not breathing through nose, O2 sat 86% this AM, when stops to breath through nose, O2 sat to 93%.  Pt states she feels at her baseline. No complaints of shortness of breath.   Dr. Royce Macadamia notified.

## 2015-12-06 NOTE — Transfer of Care (Signed)
Immediate Anesthesia Transfer of Care Note  Patient: Dawn Mercer  Procedure(s) Performed: Procedure(s): THORACIC 5 KYPHOPLASTY (N/A)  Patient Location: PACU  Anesthesia Type:General  Level of Consciousness: awake and alert   Airway & Oxygen Therapy: Patient Spontanous Breathing and Patient connected to nasal cannula oxygen  Post-op Assessment: Report given to RN, Post -op Vital signs reviewed and stable and Patient moving all extremities X 4  Post vital signs: Reviewed and stable  Last Vitals:  Vitals:   12/06/15 0652  BP: (!) 145/61  Pulse: 91  Resp: 18  Temp: 36.6 C    Last Pain:  Vitals:   12/06/15 0652  TempSrc: Oral         Complications: No apparent anesthesia complications

## 2015-12-06 NOTE — Anesthesia Procedure Notes (Signed)
Procedure Name: Intubation Date/Time: 12/06/2015 8:40 AM Performed by: Rejeana Brock L Pre-anesthesia Checklist: Patient identified, Emergency Drugs available, Suction available and Patient being monitored Patient Re-evaluated:Patient Re-evaluated prior to inductionOxygen Delivery Method: Circle System Utilized Preoxygenation: Pre-oxygenation with 100% oxygen Intubation Type: IV induction Ventilation: Mask ventilation without difficulty Laryngoscope Size: Mac and 3 Grade View: Grade I Tube type: Oral Tube size: 7.0 mm Number of attempts: 1 Airway Equipment and Method: Stylet and Oral airway Placement Confirmation: ETT inserted through vocal cords under direct vision,  positive ETCO2 and breath sounds checked- equal and bilateral Secured at: 21 cm Tube secured with: Tape Dental Injury: Teeth and Oropharynx as per pre-operative assessment

## 2015-12-06 NOTE — Brief Op Note (Signed)
12/06/2015  2:52 PM  PATIENT:  Dawn Mercer  70 y.o. female  PRE-OPERATIVE DIAGNOSIS:  T5 Compression Fracture  POST-OPERATIVE DIAGNOSIS:  T5 Compression Fracture  PROCEDURE:  Procedure(s): THORACIC 5 KYPHOPLASTY (N/A)  SURGEON:  Surgeon(s) and Role:    * Melina Schools, MD - Primary  PHYSICIAN ASSISTANT:   ASSISTANTS: Carmen Mayo   ANESTHESIA:   general  EBL:  Total I/O In: 1380 [P.O.:480; I.V.:900] Out: -   BLOOD ADMINISTERED:none  DRAINS: none   LOCAL MEDICATIONS USED:  MARCAINE     SPECIMEN:  No Specimen  DISPOSITION OF SPECIMEN:  N/A  COUNTS:  YES  TOURNIQUET:  * No tourniquets in log *  DICTATION: .Other Dictation: Dictation Number [id number]  PLAN OF CARE: Admit for overnight observation  PATIENT DISPOSITION:  PACU - hemodynamically stable.

## 2015-12-07 ENCOUNTER — Encounter (HOSPITAL_COMMUNITY): Payer: Self-pay | Admitting: Orthopedic Surgery

## 2015-12-07 DIAGNOSIS — M4854XA Collapsed vertebra, not elsewhere classified, thoracic region, initial encounter for fracture: Secondary | ICD-10-CM | POA: Diagnosis not present

## 2015-12-07 MED ORDER — HYDROCODONE-ACETAMINOPHEN 5-325 MG PO TABS: 1.0000 | ORAL_TABLET | Freq: Four times a day (QID) | ORAL | 0 refills | Status: DC | PRN

## 2015-12-07 MED ORDER — METHOCARBAMOL 500 MG PO TABS: 500.0000 mg | ORAL_TABLET | Freq: Three times a day (TID) | ORAL | 0 refills | Status: DC

## 2015-12-07 MED ORDER — ONDANSETRON HCL 4 MG PO TABS: 4.0000 mg | ORAL_TABLET | Freq: Three times a day (TID) | ORAL | 0 refills | Status: DC | PRN

## 2015-12-07 MED ORDER — ONDANSETRON 4 MG PO TBDP: 4.0000 mg | ORAL_TABLET | Freq: Three times a day (TID) | ORAL | 0 refills | Status: DC | PRN

## 2015-12-07 NOTE — Evaluation (Signed)
Occupational Therapy Evaluation and Discharge Patient Details Name: Dawn Mercer MRN: [id number] DOB: Dec 20, 1945 Today's Date: 12/07/2015    History of Present Illness Pt presents for kyphoplasty T5 following compression fx and prolonged pain. PMH: lung cancer    Clinical Impression   Pt reports she was independent with ADL PTA. Currently pt overall mod I for ADL and functional mobility. Pt with SOB during short distance functional mobility in room; educated on energy conservation strategies and discussed use of 3 in 1 in shower as a seat. All back, safety, and ADL education completed. Pt planning to d/c home with supervision from her husband. Recommending use of 3 in 1 at home for energy conservation and safety. No further acute OT needs identified; signing off at this time. Please re-consult if needs change. Thank you for this referral.    Follow Up Recommendations  No OT follow up;Supervision - Intermittent    Equipment Recommendations  3 in 1 bedside comode    Recommendations for Other Services       Precautions / Restrictions Precautions Precautions: None Precaution Comments: reviewed back precautions as guidelines for healthy back and gave handout Restrictions Weight Bearing Restrictions: No      Mobility Bed Mobility Overal bed mobility: Modified Independent             General bed mobility comments: Cues for log roll technique  Transfers Overall transfer level: Modified independent Equipment used: None                  Balance Overall balance assessment: No apparent balance deficits (not formally assessed)                                          ADL Overall ADL's : Modified independent                                       General ADL Comments: Educated pt on maintaining back precautions during functional activities, compensatory strategies for LB ADL, log roll technique for bed mobility. Educated on  energy conservation strategies. Pt noted to be SOB with short distance functional mobility in room; discussed shower seat for energy conservation and safety.      Vision Vision Assessment?: No apparent visual deficits   Perception     Praxis      Pertinent Vitals/Pain Pain Assessment: Faces Faces Pain Scale: Hurts a little bit Pain Location: back Pain Descriptors / Indicators: Sore Pain Intervention(s): Monitored during session;Ice applied     Hand Dominance     Extremity/Trunk Assessment Upper Extremity Assessment Upper Extremity Assessment: Overall WFL for tasks assessed   Lower Extremity Assessment Lower Extremity Assessment: Defer to PT evaluation   Cervical / Trunk Assessment Cervical / Trunk Assessment: Normal   Communication Communication Communication: No difficulties   Cognition Arousal/Alertness: Awake/alert Behavior During Therapy: WFL for tasks assessed/performed Overall Cognitive Status: Within Functional Limits for tasks assessed                     General Comments       Exercises       Shoulder Instructions      Home Living Family/patient expects to be discharged to:: Private residence Living Arrangements: Spouse/significant other Available Help at Discharge: Family;Available PRN/intermittently Type of Home:  House Home Access: Stairs to enter Technical brewer of Steps: 4   Home Layout: One level     Bathroom Shower/Tub: Occupational psychologist: Standard     Home Equipment: None          Prior Functioning/Environment Level of Independence: Needs assistance  Gait / Transfers Assistance Needed: independent ADL's / Homemaking Assistance Needed: husband helps with household tasks as well as being present when pt bathes and helpes her fasten her bra            OT Problem List: Decreased activity tolerance;Decreased knowledge of use of DME or AE;Pain   OT Treatment/Interventions:      OT Goals(Current  goals can be found in the care plan section) Acute Rehab OT Goals Patient Stated Goal: home today OT Goal Formulation: All assessment and education complete, DC therapy  OT Frequency:     Barriers to D/C:            Co-evaluation              End of Session Equipment Utilized During Treatment: Oxygen Nurse Communication: Mobility status;Other (comment) (needs 3 in 1 for home)  Activity Tolerance: Patient tolerated treatment well Patient left: in bed;with call bell/phone within reach;with family/visitor present   Time: 9381-8299 OT Time Calculation (min): 25 min Charges:  OT General Charges $OT Visit: 1 Procedure OT Evaluation $OT Eval Moderate Complexity: 1 Procedure OT Treatments $Self Care/Home Management : 8-22 mins G-Codes: OT G-codes **NOT FOR INPATIENT CLASS** Functional Assessment Tool Used: Clinical judgement Functional Limitation: Self care Self Care Current Status (B7169): At least 1 percent but less than 20 percent impaired, limited or restricted Self Care Goal Status (C7893): At least 1 percent but less than 20 percent impaired, limited or restricted Self Care Discharge Status [phone]): At least 1 percent but less than 20 percent impaired, limited or restricted   Binnie Kand M.S., OTR/L Pager: [phone]  12/07/2015, 10:02 AM

## 2015-12-07 NOTE — Progress Notes (Signed)
    Subjective: 1 Day Post-Op Procedure(s) (LRB): THORACIC 5 KYPHOPLASTY (N/A) Patient reports pain as 3 on 0-10 scale.   Denies CP or SOB.  Voiding without difficulty. Positive BM. No nausea Objective: Vital signs in last 24 hours: Temp:  [97.2 F (36.2 C)-97.7 F (36.5 C)] 97.7 F (36.5 C) (10/12 0400) Pulse Rate:  [74-113] 74 (10/12 0400) Resp:  [18-26] 20 (10/12 0400) BP: (109-135)/(66-81) 109/71 (10/12 0400) SpO2:  [89 %-98 %] 98 % (10/12 0400)  Intake/Output from previous day: 10/11 0701 - 10/12 0700 In: 1910 [P.O.:960; I.V.:900; IV Piggyback:50] Out: -  Intake/Output this shift: No intake/output data recorded.  Labs: No results for input(s): HGB in the last 72 hours. No results for input(s): WBC, RBC, HCT, PLT in the last 72 hours. No results for input(s): NA, K, CL, CO2, BUN, CREATININE, GLUCOSE, CALCIUM in the last 72 hours. No results for input(s): LABPT, INR in the last 72 hours.  Physical Exam: Neurologically intact ABD soft Sensation intact distally Dorsiflexion/Plantar flexion intact Incision: no drainage Compartment soft  Assessment/Plan: 1 Day Post-Op Procedure(s) (LRB): THORACIC 5 KYPHOPLASTY (N/A) Advance diet Up with therapy  DC today after cleared by PT  Mayo, Darla Lesches for Dr. Melina Schools [facility] [phone] 12/07/2015, 7:02 AM    Patient ID: Dawn Mercer, female   DOB: 06-28-1945, 70 y.o.   MRN: [id number]

## 2015-12-07 NOTE — Anesthesia Postprocedure Evaluation (Signed)
Anesthesia Post Note  Patient: Dawn Mercer  Procedure(s) Performed: Procedure(s) (LRB): THORACIC 5 KYPHOPLASTY (N/A)  Patient location during evaluation: PACU Anesthesia Type: General Level of consciousness: awake and alert and oriented Pain management: pain level controlled Vital Signs Assessment: post-procedure vital signs reviewed and stable Respiratory status: spontaneous breathing, nonlabored ventilation, respiratory function stable and patient connected to nasal cannula oxygen Cardiovascular status: blood pressure returned to baseline and stable Postop Assessment: no signs of nausea or vomiting Anesthetic complications: no                   Tiffanny Lamarche A.

## 2015-12-07 NOTE — Op Note (Signed)
NAMEDAVENE, JOBIN             ACCOUNT NO.:  [id number]  MEDICAL RECORD NO.:  [id number]  LOCATION:  [id number]                        FACILITY:  [facility]  PHYSICIAN:  Stefano Trulson D. Rolena Infante, M.D. DATE OF BIRTH:  [birth year]  DATE OF PROCEDURE:  12/06/2015 DATE OF DISCHARGE:                              OPERATIVE REPORT   PREOPERATIVE DIAGNOSIS:  T5 compression fracture.  POSTOPERATIVE DIAGNOSIS:  T5 compression fracture.  OPERATIVE PROCEDURE:  T11 kyphoplasty.  FIRST ASSISTANT:  [facility].  HISTORY:  This is a very pleasant 70 year old woman who has had progressive thoracic pain after suffering a T5 compression fracture. Conservative management failed to alleviate her symptoms, so we elected to proceed with surgery.  All appropriate risks, benefits, and alternatives were explained to the patient and consent was obtained.  OPERATIVE NOTE:  The patient was brought to the operating room and placed supine on the operating table.  After successful induction of general anesthesia and endotracheal intubation, TEDs, SCDs were applied. She was turned prone onto a chest and pelvic roll.  The thoracic spine was prepped and draped in a standard fashion.  Time-out was taken confirming the patient, procedure, and all other pertinent important data.  Two C-arms were brought into the field for biplanar fluoro from a left-sided approach.  I identified the lateral border of the T5 pedicle and then made a small incision.  I advanced the Kyphon introduction trocar down to the lateral aspect of the pedicle.  I took a standard trans extra pedicular approach, starting basically through the rib and then into the pedicle and then into the vertebral body.  I confirmed trajectory in both the AP and lateral planes.  Once I had the proper positioning of the trocar, I then placed the drill and the inflatable bone tamp.  The bone tamp itself was able cross the midline.  I then inflated to about 2 mL and I had good  positioning.  I then removed this and then inserted the cement.  A good flow along the inferior endplate which was where the disruption occurred causing the fracture.  Once the cement was allowed to cure, I removed the trocar and final x-rays were satisfactory.  The wound was cleaned and dried and then a single 3-0 Monocryl was used. A simple suture was placed.  The patient was ultimately extubated and transferred to PACU without incident.  At the end of the case, all needle and sponge counts were correct.  There were no adverse intraoperative events.     Mabel Roll D. Rolena Infante, M.D.    DDB/MEDQ  D:  12/06/2015  T:  12/07/2015  Job:  [id number]

## 2015-12-07 NOTE — Progress Notes (Signed)
Patient alert and oriented, mae's well, voiding adequate amount of urine, swallowing without difficulty, c/o moderate pain and meds given prior to discharged for ride and discomfort. Patient discharged home with family. Script and discharged instructions given to patient. Patient and family stated understanding of instructions given.

## 2015-12-14 NOTE — Discharge Summary (Signed)
Physician Discharge Summary  Patient ID: Dawn Mercer MRN: [id number] DOB/AGE: 01-Jun-1945 70 y.o.  Admit date: 12/06/2015 Discharge date: 12/14/2015  Admission Diagnoses:  Thoracic five Kyphoplasty  Discharge Diagnoses:  Active Problems:   Compression fracture of body of thoracic vertebra [facility])   Past Medical History:  Diagnosis Date  . Anxiety   . Bronchitis    Hx: of  . 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 Claustrophobia   . COPD (chronic obstructive pulmonary disease) ([place])   . GERD (gastroesophageal reflux disease)   . H/O hiatal hernia   . Hay fever    Hx: of  . History of radiation therapy 01/28/13-03/23/13   63 gray to right upper lobe  . Hypothyroidism   . IBS (irritable bowel syndrome)    Hx: of  . Lung cancer, upper lobe ([place]) dx'd 12/2012   chemo and XRt complete  . 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 Pneumonia    years ago  . Seizures ([place])    Hx: of over 40 years ago  . Shortness of breath    Hx: of with exertion  . Status post radiation therapy 10/1999  . 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 female who was admitted 12/06/2015 with a diagnosis of T5 Compression Fracture and went to the operating room on 12/06/2015 and underwent the above named procedures.  Post op day 1 pt is voiding w/o difficulty. Pain controlled on oral medication.  Cleared by PT to be discharged  She was given perioperative antibiotics:  Anti-infectives    Start     Dose/Rate Route Frequency Ordered Stop   12/06/15 1630  ceFAZolin (ANCEF) IVPB 1 g/50 mL premix     1 g 100 mL/hr over 30 Minutes Intravenous Every 8 hours 12/06/15 1103 12/06/15 2351   12/06/15 0606  ceFAZolin (ANCEF) IVPB 2g/100 mL premix     2 g 200 mL/hr over 30 Minutes  Intravenous 30 min pre-op 12/06/15 0606 12/06/15 0842    .  She was given sequential compression devices, early ambulation, and TED for DVT prophylaxis.  She benefited maximally from the hospital stay and there were no complications.    Recent vital signs:  Vitals:   12/07/15 0400 12/07/15 0730  BP: 109/71 124/66  Pulse: 74 74  Resp: 20 18  Temp: 97.7 F (36.5 C) 98 F (36.7 C)    Recent laboratory studies:  Lab Results  Component Value Date   HGB 13.2 11/29/2015   HGB 13.3 11/13/2015   HGB 13.7 08/17/2015   Lab Results  Component Value Date   WBC 7.4 11/29/2015   PLT 198 11/29/2015   Lab Results  Component Value Date   INR 0.93 01/11/2013   Lab Results  Component Value Date   NA 138 11/29/2015   K 3.9 11/29/2015   CL 104 11/29/2015   CO2 27 11/29/2015   BUN 8 11/29/2015   CREATININE 0.61 11/29/2015   GLUCOSE 94 11/29/2015    Discharge Medications:     Medication List    TAKE these medications   albuterol (2.5 MG/3ML) 0.083% nebulizer solution Commonly known as:  PROVENTIL Take 3 mLs (2.5 mg total) by nebulization 3 (three) times daily.   albuterol 108 (90 Base) MCG/ACT inhaler Commonly  known as:  PROVENTIL HFA;VENTOLIN HFA Inhale 1 puff into the lungs every 6 (six) hours as needed for wheezing.   alendronate 70 MG tablet Commonly known as:  FOSAMAX Take 70 mg by mouth once a week. Saturdays.   ALPRAZolam 0.5 MG tablet Commonly known as:  XANAX Take 0.5 tablets (0.25 mg total) by mouth 2 (two) times daily as needed for anxiety.   budesonide 0.25 MG/2ML nebulizer solution Commonly known as:  PULMICORT Take 2 mLs (0.25 mg total) by nebulization 3 (three) times daily. What changed:  when to take this   calcitonin (salmon) 200 UNIT/ACT nasal spray Commonly known as:  MIACALCIN/FORTICAL Place 1 spray into alternate nostrils daily.   DEXILANT 60 MG capsule Generic drug:  dexlansoprazole Take 60 mg by mouth daily as needed (for acid reflux.).    gabapentin 100 MG capsule Commonly known as:  NEURONTIN Take 100 mg by mouth daily.   HYDROcodone-acetaminophen 5-325 MG tablet Commonly known as:  NORCO Take 1 tablet by mouth every 6 (six) hours as needed for severe pain. What changed:  reasons to take this   ipratropium-albuterol 0.5-2.5 (3) MG/3ML Soln Commonly known as:  DUONEB Take 3 mLs by nebulization 3 (three) times daily.   levothyroxine 125 MCG tablet Commonly known as:  SYNTHROID, LEVOTHROID Take 125 mcg by mouth daily before breakfast.   methocarbamol 500 MG tablet Commonly known as:  ROBAXIN Take 1 tablet (500 mg total) by mouth 3 (three) times daily.   ondansetron 4 MG disintegrating tablet Commonly known as:  ZOFRAN ODT Take 1 tablet (4 mg total) by mouth every 8 (eight) hours as needed for nausea or vomiting.   ondansetron 4 MG tablet Commonly known as:  ZOFRAN Take 1 tablet (4 mg total) by mouth every 8 (eight) hours as needed for nausea or vomiting.   OXYGEN Inhale into the lungs. 3 lpm with exertion   Vitamin D3 400 units Caps Take 1 tablet by mouth 2 (two) times daily.       Diagnostic Studies: Dg Thoracic Spine 2 View  Result Date: 12/06/2015 CLINICAL DATA:  T5 kyphoplasty EXAM: DG C-ARM 61-120 MIN; THORACIC SPINE 2 VIEWS COMPARISON:  MRI 09/25/2015 FINDINGS: Single intraoperative spot image of the thoracic spine demonstrates single level vertebral augmentation changes, reportedly at T5. No complicating feature. IMPRESSION: T5 vertebral augmentation without complicating feature. Electronically Signed   By: Rolm Baptise M.D.   On: 12/06/2015 09:57   Dg Chest Port 1 View  Result Date: 12/06/2015 CLINICAL DATA:  Status post T5 kyphoplasty.  Pain. EXAM: PORTABLE CHEST 1 VIEW COMPARISON:  Chest radiograph and chest CT August 17, 2015 FINDINGS: There is evidence of kyphoplasty at T5. No pneumothorax evident. There is scarring and atelectasis in the right upper lobe, increased from most recent prior study.  There is mild scarring in the right base. There is stable apical pleural thickening bilaterally. Lungs elsewhere are clear. Heart is borderline prominent with pulmonary vascularity within normal limits. There is atherosclerotic calcification in the aorta. Port-A-Cath tip is in the superior vena cava near the junction with the right atrium, stable. No adenopathy. IMPRESSION: No pneumothorax. Increase in right upper lobe atelectasis. Apical pleural scarring and thickening bilaterally. Mild scarring right base. No edema or consolidation. Stable cardiac prominence. Aortic atherosclerosis. Electronically Signed   By: Lowella Grip III M.D.   On: 12/06/2015 10:30   Dg C-arm 1-60 Min  Result Date: 12/06/2015 CLINICAL DATA:  T5 kyphoplasty EXAM: DG C-ARM 61-120 MIN; THORACIC SPINE 2  VIEWS COMPARISON:  MRI 09/25/2015 FINDINGS: Single intraoperative spot image of the thoracic spine demonstrates single level vertebral augmentation changes, reportedly at T5. No complicating feature. IMPRESSION: T5 vertebral augmentation without complicating feature. Electronically Signed   By: Rolm Baptise M.D.   On: 12/06/2015 09:57    Disposition: 01-Home or Self Care Pt will present in clinic in 2 weeks Post op meds given   Follow-up Information    Dahlia Bailiff, MD .   Specialty:  Orthopedic Surgery Contact information: [address] [place] [address] [place] [address] [phone]            Signed: Valinda Hoar 12/14/2015, 3:46 PM

## 2015-12-25 ENCOUNTER — Other Ambulatory Visit (HOSPITAL_BASED_OUTPATIENT_CLINIC_OR_DEPARTMENT_OTHER): Payer: Medicare Other

## 2015-12-25 ENCOUNTER — Other Ambulatory Visit: Payer: Self-pay | Admitting: Medical Oncology

## 2015-12-25 ENCOUNTER — Ambulatory Visit (HOSPITAL_BASED_OUTPATIENT_CLINIC_OR_DEPARTMENT_OTHER): Payer: Medicare Other

## 2015-12-25 DIAGNOSIS — J9611 Chronic respiratory failure with hypoxia: Secondary | ICD-10-CM

## 2015-12-25 DIAGNOSIS — Z452 Encounter for adjustment and management of vascular access device: Secondary | ICD-10-CM

## 2015-12-25 DIAGNOSIS — Z85118 Personal history of other malignant neoplasm of bronchus and lung: Secondary | ICD-10-CM

## 2015-12-25 DIAGNOSIS — C3411 Malignant neoplasm of upper lobe, right bronchus or lung: Secondary | ICD-10-CM

## 2015-12-25 LAB — CBC WITH DIFFERENTIAL/PLATELET
BASO%: 0.3 % (ref 0.0–2.0)
Basophils Absolute: 0 10*3/uL (ref 0.0–0.1)
EOS ABS: 0.3 10*3/uL (ref 0.0–0.5)
EOS%: 4.1 % (ref 0.0–7.0)
HEMATOCRIT: 39.1 % (ref 34.8–46.6)
HEMOGLOBIN: 12.8 g/dL (ref 11.6–15.9)
LYMPH#: 1.6 10*3/uL (ref 0.9–3.3)
LYMPH%: 23.4 % (ref 14.0–49.7)
MCH: 29.1 pg (ref 25.1–34.0)
MCHC: 32.7 g/dL (ref 31.5–36.0)
MCV: 88.9 fL (ref 79.5–101.0)
MONO#: 0.7 10*3/uL (ref 0.1–0.9)
MONO%: 10.9 % (ref 0.0–14.0)
NEUT%: 61.3 % (ref 38.4–76.8)
NEUTROS ABS: 4.1 10*3/uL (ref 1.5–6.5)
PLATELETS: 193 10*3/uL (ref 145–400)
RBC: 4.4 10*6/uL (ref 3.70–5.45)
RDW: 13.6 % (ref 11.2–14.5)
WBC: 6.8 10*3/uL (ref 3.9–10.3)

## 2015-12-25 LAB — COMPREHENSIVE METABOLIC PANEL
ALBUMIN: 3.3 g/dL — AB (ref 3.5–5.0)
ALK PHOS: 124 U/L (ref 40–150)
ALT: 15 U/L (ref 0–55)
ANION GAP: 9 meq/L (ref 3–11)
AST: 17 U/L (ref 5–34)
BILIRUBIN TOTAL: 0.8 mg/dL (ref 0.20–1.20)
BUN: 16 mg/dL (ref 7.0–26.0)
CALCIUM: 9.4 mg/dL (ref 8.4–10.4)
CO2: 27 mEq/L (ref 22–29)
Chloride: 102 mEq/L (ref 98–109)
Creatinine: 0.7 mg/dL (ref 0.6–1.1)
EGFR: 85 mL/min/{1.73_m2} — AB (ref >90)
GLUCOSE: 85 mg/dL (ref 70–140)
Potassium: 4.4 mEq/L (ref 3.5–5.1)
SODIUM: 138 meq/L (ref 136–145)
Total Protein: 7.4 g/dL (ref 6.4–8.3)

## 2015-12-25 MED ORDER — SODIUM CHLORIDE 0.9 % IJ SOLN
10.0000 mL | INTRAMUSCULAR | Status: DC | PRN
  Administered 2015-12-25: 10 mL via INTRAVENOUS
  Filled 2015-12-25: qty 10

## 2015-12-25 MED ORDER — HEPARIN SOD (PORK) LOCK FLUSH 100 UNIT/ML IV SOLN
500.0000 [IU] | Freq: Once | INTRAVENOUS | Status: AC | PRN
Start: 2015-12-25 — End: 2015-12-25
  Administered 2015-12-25: 500 [IU] via INTRAVENOUS
  Filled 2015-12-25: qty 5

## 2015-12-29 ENCOUNTER — Ambulatory Visit (HOSPITAL_COMMUNITY)
Admission: RE | Admit: 2015-12-29 | Discharge: 2015-12-29 | Disposition: A | Discharge status: Home / Self Care | Payer: Medicare Other | Source: Ambulatory Visit | Attending: Internal Medicine | Admitting: Internal Medicine

## 2015-12-29 ENCOUNTER — Encounter (HOSPITAL_COMMUNITY): Payer: Self-pay

## 2015-12-29 DIAGNOSIS — C3411 Malignant neoplasm of upper lobe, right bronchus or lung: Secondary | ICD-10-CM | POA: Diagnosis not present

## 2015-12-29 DIAGNOSIS — J90 Pleural effusion, not elsewhere classified: Secondary | ICD-10-CM | POA: Insufficient documentation

## 2015-12-29 DIAGNOSIS — J9611 Chronic respiratory failure with hypoxia: Secondary | ICD-10-CM | POA: Diagnosis not present

## 2015-12-29 MED ORDER — IOPAMIDOL (ISOVUE-300) INJECTION 61%
75.0000 mL | Freq: Once | INTRAVENOUS | Status: AC | PRN
  Administered 2015-12-29: 75 mL via INTRAVENOUS

## 2016-01-01 ENCOUNTER — Encounter: Payer: Self-pay | Admitting: Internal Medicine

## 2016-01-01 ENCOUNTER — Telehealth: Payer: Self-pay | Admitting: Internal Medicine

## 2016-01-01 ENCOUNTER — Ambulatory Visit (HOSPITAL_BASED_OUTPATIENT_CLINIC_OR_DEPARTMENT_OTHER): Payer: Medicare Other | Admitting: Internal Medicine

## 2016-01-01 VITALS — BP 108/69 | HR 91 | Temp 97.7°F | Resp 16 | Ht 65.5 in | Wt 189.6 lb

## 2016-01-01 DIAGNOSIS — C3411 Malignant neoplasm of upper lobe, right bronchus or lung: Secondary | ICD-10-CM

## 2016-01-01 DIAGNOSIS — Z85118 Personal history of other malignant neoplasm of bronchus and lung: Secondary | ICD-10-CM | POA: Diagnosis not present

## 2016-01-01 NOTE — Progress Notes (Signed)
[place] [phone]   [phone]  OFFICE PROGRESS NOTE  Melinda Crutch, MD [place] [address]  DIAGNOSIS: Stage IIIA (T1b., N2, M0) non-small cell lung cancer, squamous cell carcinoma presented with right upper lobe lung nodule in addition to mediastinal lymphadenopathy diagnosed in November of 2014.  PRIOR THERAPY:  1) Concurrent chemoradiation with weekly carboplatin for AUC of 2 and paclitaxel 45 mg/M2, status post 8 cycles, last dose was given 03/15/2013 with partial response. 2) Consolidation chemotherapy with carboplatin for AUC of 5 and paclitaxel 175 mg/M2 every 3 weeks with Neulasta support, status post 3 cycles. First dose 05/03/2013. Last dose was given 06/14/2013.  CURRENT THERAPY: Observation.  CHEMOTHERAPY INTENT: Control/curative  CURRENT # OF CHEMOTHERAPY CYCLES: 0  CURRENT ANTIEMETICS: Zofran, dexamethasone and Compazine  CURRENT SMOKING STATUS: Former smoker.  ORAL CHEMOTHERAPY AND CONSENT: None  CURRENT BISPHOSPHONATES USE: None  PAIN MANAGEMENT: 0/10  NARCOTICS INDUCED CONSTIPATION: None  LIVING WILL AND CODE STATUS: Full code   INTERVAL HISTORY: Dawn Mercer 70 y.o. female returns to the clinic today for followup visit accompanied by her husband. She has no complaints today. She recently underwent T5 kyphoplasty under the care of Dr. Rolena Infante. She is feeling much better. She has shortness of breath and currently on home oxygen. She continues to have mild cough with no hemoptysis. She denied having any nausea or vomiting. She has no fever or chills. She has no significant weight loss or night sweats. She had repeat CT scan of the chest performed recently and she is here for evaluation and discussion of her scan results.  MEDICAL HISTORY: Past Medical History:  Diagnosis Date  . Anxiety   . Bronchitis    Hx: of  . 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  Claustrophobia   . COPD (chronic obstructive pulmonary disease) ([facility])   . GERD (gastroesophageal reflux disease)   . H/O hiatal hernia   . Hay fever    Hx: of  . History of radiation therapy 01/28/13-03/23/13   63 gray to right upper lobe  . Hypothyroidism   . IBS (irritable bowel syndrome)    Hx: of  . Lung cancer, upper lobe ([facility]) dx'd 12/2012   chemo and XRt complete  . 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 Pneumonia    years ago  . Seizures ([facility])    Hx: of over 40 years ago  . Shortness of breath    Hx: of with exertion  . Status post radiation therapy 10/1999  . 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  MEDICATIONS:  Current Outpatient Prescriptions  Medication Sig Dispense Refill  . albuterol (PROVENTIL HFA;VENTOLIN HFA) 108 (90 BASE) MCG/ACT inhaler Inhale 1 puff into the lungs every 6 (six) hours as needed for wheezing.    Marland Kitchen albuterol (PROVENTIL) (2.5 MG/3ML) 0.083% nebulizer solution Take 3 mLs (2.5 mg total) by nebulization 3 (three) times daily. 270 mL 11  . alendronate (FOSAMAX) 70 MG tablet Take 70 mg by mouth once a week. Saturdays.    . ALPRAZolam (XANAX) 0.5 MG tablet Take 0.5 tablets (0.25 mg total) by mouth 2 (two) times daily as needed for anxiety. 90 tablet 0  . budesonide (PULMICORT) 0.25 MG/2ML nebulizer solution Take 2 mLs (0.25 mg total) by nebulization 3 (three) times daily. (Patient taking differently: Take  0.25 mg by nebulization 2 (two) times daily. ) 180 mL 11  . calcitonin, salmon, (MIACALCIN/FORTICAL) 200 UNIT/ACT nasal spray Place 1 spray into alternate nostrils daily.    . Cholecalciferol (VITAMIN D3) 400 units CAPS Take 1 tablet by mouth 2 (two) times daily.    Marland Kitchen dexlansoprazole (DEXILANT) 60 MG capsule Take 60 mg by mouth daily as needed (for acid reflux.).    Marland Kitchen gabapentin (NEURONTIN) 100 MG capsule Take 100 mg by mouth daily.    Marland Kitchen HYDROcodone-acetaminophen (NORCO) 5-325  MG tablet Take 1 tablet by mouth every 6 (six) hours as needed for severe pain. 60 tablet 0  . ipratropium-albuterol (DUONEB) 0.5-2.5 (3) MG/3ML SOLN Take 3 mLs by nebulization 3 (three) times daily. 270 mL 11  . levothyroxine (SYNTHROID, LEVOTHROID) 125 MCG tablet Take 125 mcg by mouth daily before breakfast.    . methocarbamol (ROBAXIN) 500 MG tablet Take 1 tablet (500 mg total) by mouth 3 (three) times daily. 30 tablet 0  . ondansetron (ZOFRAN ODT) 4 MG disintegrating tablet Take 1 tablet (4 mg total) by mouth every 8 (eight) hours as needed for nausea or vomiting. 20 tablet 0  . ondansetron (ZOFRAN) 4 MG tablet Take 1 tablet (4 mg total) by mouth every 8 (eight) hours as needed for nausea or vomiting. 20 tablet 0  . OXYGEN Inhale into the lungs. 3 lpm with exertion     No current facility-administered medications for this visit.     SURGICAL HISTORY:  Past Surgical History:  Procedure Laterality Date  . CHOLECYSTECTOMY N/A 08/03/2013   Procedure: LAPAROSCOPIC CHOLECYSTECTOMY;  Surgeon: Harl Bowie, MD;  Location: [facility];  Service: General;  Laterality: N/A;  . COLONOSCOPY W/ BIOPSIES AND POLYPECTOMY     Hx: of  . KYPHOPLASTY N/A 12/06/2015   Procedure: THORACIC 5 KYPHOPLASTY;  Surgeon: Melina Schools, MD;  Location: [facility];  Service: Orthopedics;  Laterality: N/A;  . LASER ABLATION  04/2004   for VIN III  . PORTACATH PLACEMENT    . TUBAL LIGATION    . VIDEO BRONCHOSCOPY WITH ENDOBRONCHIAL NAVIGATION N/A 12/31/2012   Procedure: VIDEO BRONCHOSCOPY WITH ENDOBRONCHIAL NAVIGATION;  Surgeon: Melrose Nakayama, MD;  Location: [facility];  Service: Thoracic;  Laterality: N/A;  . VIDEO BRONCHOSCOPY WITH ENDOBRONCHIAL ULTRASOUND N/A 12/31/2012   Procedure: VIDEO BRONCHOSCOPY WITH ENDOBRONCHIAL ULTRASOUND;  Surgeon: Melrose Nakayama, MD;  Location: [facility];  Service: Thoracic;  Laterality: N/A;    REVIEW OF SYSTEMS:  A comprehensive review of systems was negative except for: Respiratory:  positive for dyspnea on exertion   PHYSICAL EXAMINATION: General appearance: alert, cooperative and no distress Head: Normocephalic, without obvious abnormality, atraumatic Neck: no adenopathy, no JVD, supple, symmetrical, trachea midline and thyroid not enlarged, symmetric, no tenderness/mass/nodules Lymph nodes: Cervical, supraclavicular, and axillary nodes normal. Resp: clear to auscultation bilaterally Back: symmetric, no curvature. ROM normal. No CVA tenderness. Cardio: regular rate and rhythm, S1, S2 normal, no murmur, click, rub or gallop GI: soft, non-tender; bowel sounds normal; no masses,  no organomegaly Extremities: extremities normal, atraumatic, no cyanosis or edema Neurologic: Alert and oriented X 3, normal strength and tone. Normal symmetric reflexes. Normal coordination and gait  ECOG PERFORMANCE STATUS: 1 - Symptomatic but completely ambulatory  Blood pressure 108/69, pulse 91, temperature 97.7 F (36.5 C), temperature source Oral, resp. rate 16, height 5' 5.5" (1.664 m), weight 189 lb 9.6 oz (86 kg), SpO2 96 %.  LABORATORY DATA: Lab Results  Component Value Date   WBC 6.8  12/25/2015   HGB 12.8 12/25/2015   HCT 39.1 12/25/2015   MCV 88.9 12/25/2015   PLT 193 12/25/2015      Chemistry      Component Value Date/Time   NA 138 12/25/2015 1356   K 4.4 12/25/2015 1356   CL 104 11/29/2015 1306   CO2 27 12/25/2015 1356   BUN 16.0 12/25/2015 1356   CREATININE 0.7 12/25/2015 1356      Component Value Date/Time   CALCIUM 9.4 12/25/2015 1356   ALKPHOS 124 12/25/2015 1356   AST 17 12/25/2015 1356   ALT 15 12/25/2015 1356   BILITOT 0.80 12/25/2015 1356       RADIOGRAPHIC STUDIES: Dg Thoracic Spine 2 View  Result Date: 12/06/2015 CLINICAL DATA:  T5 kyphoplasty EXAM: DG C-ARM 61-120 MIN; THORACIC SPINE 2 VIEWS COMPARISON:  MRI 09/25/2015 FINDINGS: Single intraoperative spot image of the thoracic spine demonstrates single level vertebral augmentation changes,  reportedly at T5. No complicating feature. IMPRESSION: T5 vertebral augmentation without complicating feature. Electronically Signed   By: Rolm Baptise M.D.   On: 12/06/2015 09:57   Ct Chest W Contrast  Result Date: 12/29/2015 CLINICAL DATA:  Restaging lung cancer.  Status post chemotherapy. EXAM: CT CHEST WITH CONTRAST TECHNIQUE: Multidetector CT imaging of the chest was performed during intravenous contrast administration. CONTRAST:  43m ISOVUE-300 IOPAMIDOL (ISOVUE-300) INJECTION 61% COMPARISON:  CT chest 08/17/2015 FINDINGS: Cardiovascular: The heart size appears normal. No pericardial effusion identified. Aortic atherosclerosis noted. Calcification at the bifurcation of the left main coronary artery identified. Mediastinum/Nodes: The trachea was patent and midline. Normal appearance of the esophagus. There is no adenopathy within the mediastinum. Left hilar lymph node measures 1 cm, image 68 of series 2. Unchanged from previous exam. Increase soft tissue within the right perihilar region is stable when compared with study from 6/22/ 17 and is largely attributable to changes secondary to external beam radiation. There is a right hilar node which measures 9 mm, also unchanged from previous exam. Lungs/Pleura: Small loculated pleural effusion overlying the posterior right midlung is again noted and is equivocal E increased in volume from previous exam. Extensive scratch set architectural distortion scarring and fibrosis within the right upper and right mid lung zone is identified compatible with changes secondary to external beam radiation. There is a focal area of increasing subpleural nodular consolidation along the anterior right lung. On today's study this measures 1.5 x 1.7 cm, image number 36 of series 2. On the previous exam this measured 1.1 x 0.8 cm. Background of mosaic attenuation pattern and moderate changes of centrilobular emphysema noted. Upper Abdomen: Previous cholecystectomy. There is no  focal liver abnormality identified. There is a nodule identified within the which measures 1.4 cm, image 145 of series 2 and likely represents a benign adenoma. Musculoskeletal: Treated compression fracture involving T5 vertebra is again identified. No aggressive lytic or sclerotic bone lesions identified. IMPRESSION: 1. There is a focal area of subpleural nodularity within the anterior right lung which is increased in size compared with previous exam. Although findings may reflect continued evolutionary changes secondary to external beam radiation close interval follow-up to exclude recurrence of tumor is advise. 2. Similar appearance of increased right perihilar soft tissue which (based on PET-CT from 09/06/2015) is favored to represent changes secondary to external beam radiation. 3. Small loculated effusion overlying the posterior right lung is stable from previous study. 4. Treated compression fracture involves the T5 vertebra Electronically Signed   By: TKerby MoorsM.D.   On:  12/29/2015 15:21   Dg Chest Port 1 View  Result Date: 12/06/2015 CLINICAL DATA:  Status post T5 kyphoplasty.  Pain. EXAM: PORTABLE CHEST 1 VIEW COMPARISON:  Chest radiograph and chest CT August 17, 2015 FINDINGS: There is evidence of kyphoplasty at T5. No pneumothorax evident. There is scarring and atelectasis in the right upper lobe, increased from most recent prior study. There is mild scarring in the right base. There is stable apical pleural thickening bilaterally. Lungs elsewhere are clear. Heart is borderline prominent with pulmonary vascularity within normal limits. There is atherosclerotic calcification in the aorta. Port-A-Cath tip is in the superior vena cava near the junction with the right atrium, stable. No adenopathy. IMPRESSION: No pneumothorax. Increase in right upper lobe atelectasis. Apical pleural scarring and thickening bilaterally. Mild scarring right base. No edema or consolidation. Stable cardiac prominence.  Aortic atherosclerosis. Electronically Signed   By: Lowella Grip III M.D.   On: 12/06/2015 10:30   Dg C-arm 1-60 Min  Result Date: 12/06/2015 CLINICAL DATA:  T5 kyphoplasty EXAM: DG C-ARM 61-120 MIN; THORACIC SPINE 2 VIEWS COMPARISON:  MRI 09/25/2015 FINDINGS: Single intraoperative spot image of the thoracic spine demonstrates single level vertebral augmentation changes, reportedly at T5. No complicating feature. IMPRESSION: T5 vertebral augmentation without complicating feature. Electronically Signed   By: Rolm Baptise M.D.   On: 12/06/2015 09:57   ASSESSMENT AND PLAN: This is a very pleasant 70 years old white female recently diagnosed with a stage IIIA non-small cell lung cancer currently undergoing concurrent chemoradiation with weekly carboplatin and paclitaxel status post 8 weekly treatment followed by consolidation chemotherapy with carboplatin and paclitaxel status post 3 cycles.  She has been on observation for more than 2 yearsl. She has been complaining of chest pressure as well as shortness breath and cough productive of yellowish sputum recently. CT angiogram of the chest performed on 08/17/2015 showed increase in the right perihilar soft tissue and right hilar adenopathy suspicious for disease progression. A PET scan performed recently showed no clear evidence for disease recurrence but there was questionable lesion at T5 vertebral body concerning for osteopenia from prior radiation versus bone metastasis. MRI of the thoracic spine showed no clear evidence of metastatic disease to the T5 lesion and its likely to represent insufficiency over pathologic fracture. She underwent T5 kyphoplasty recently. Her recent CT scan of the chest showed no concerning finding for disease progression except for the further evaluation of the radiation changes. I discussed the scan results with the patient and her husband. I recommended for her to continue on observation with repeat CT scan of the chest  in 3 months for further evaluation of the density in the right lung. She was advised to call immediately if she has any concerning symptoms in the interval. The patient voices understanding of current disease status and treatment options and is in agreement with the current care plan.  All questions were answered. The patient knows to call the clinic with any problems, questions or concerns. We can certainly see the patient much sooner if necessary.  Disclaimer: This note was dictated with voice recognition software. Similar sounding words can inadvertently be transcribed and may not be corrected upon review.

## 2016-01-01 NOTE — Telephone Encounter (Signed)
Appointments scheduled per 01/01/16 los. AVS report and appointment schedule given to patient, per 01/01/16 los.

## 2016-01-11 ENCOUNTER — Ambulatory Visit
Admission: RE | Admit: 2016-01-11 | Discharge: 2016-01-11 | Disposition: A | Discharge status: Home / Self Care | Payer: Medicare Other | Source: Ambulatory Visit | Attending: Radiation Oncology | Admitting: Radiation Oncology

## 2016-01-11 ENCOUNTER — Encounter: Payer: Self-pay | Admitting: Radiation Oncology

## 2016-01-11 ENCOUNTER — Telehealth: Payer: Self-pay | Admitting: Oncology

## 2016-01-11 VITALS — BP 88/63 | HR 111 | Temp 97.8°F | Ht 65.5 in | Wt 186.2 lb

## 2016-01-11 DIAGNOSIS — C3411 Malignant neoplasm of upper lobe, right bronchus or lung: Secondary | ICD-10-CM

## 2016-01-11 DIAGNOSIS — Z8541 Personal history of malignant neoplasm of cervix uteri: Secondary | ICD-10-CM | POA: Diagnosis not present

## 2016-01-11 DIAGNOSIS — J449 Chronic obstructive pulmonary disease, unspecified: Secondary | ICD-10-CM | POA: Insufficient documentation

## 2016-01-11 DIAGNOSIS — Z91040 Latex allergy status: Secondary | ICD-10-CM | POA: Insufficient documentation

## 2016-01-11 DIAGNOSIS — Z885 Allergy status to narcotic agent status: Secondary | ICD-10-CM | POA: Diagnosis not present

## 2016-01-11 DIAGNOSIS — C3410 Malignant neoplasm of upper lobe, unspecified bronchus or lung: Secondary | ICD-10-CM | POA: Diagnosis not present

## 2016-01-11 DIAGNOSIS — Z923 Personal history of irradiation: Secondary | ICD-10-CM | POA: Insufficient documentation

## 2016-01-11 DIAGNOSIS — Z9221 Personal history of antineoplastic chemotherapy: Secondary | ICD-10-CM | POA: Insufficient documentation

## 2016-01-11 MED ORDER — HYDROCODONE-ACETAMINOPHEN 5-325 MG PO TABS: 1.0000 | ORAL_TABLET | Freq: Four times a day (QID) | ORAL | 0 refills | Status: DC | PRN

## 2016-01-11 MED ORDER — ALPRAZOLAM 0.5 MG PO TABS: 0.2500 mg | ORAL_TABLET | Freq: Two times a day (BID) | ORAL | 0 refills | Status: DC | PRN

## 2016-01-11 NOTE — Telephone Encounter (Signed)
Called in Xanax refill per Dr. Sondra Come to [facility] in [place].

## 2016-01-11 NOTE — Progress Notes (Signed)
Radiation Oncology         [phone]) [phone] ________________________________  Name: Dawn Mercer MRN: [id number]  Date: 01/11/2016  DOB: March 31, 1945    Follow-Up Visit Note  CC: Melinda Crutch, MD  Antony Blackbird, MD  Diagnosis: Stage IIIA (T1b., N2, M0) non-small cell lung cancer, squamous cell carcinoma presenting with right upper lobe lung nodule  Radiation Treatment Dates:  -January 28, 2013 through March 23, 2013: Right upper lobe primary site and mediastinal area, 63 gray in 35 fractions.   -September 2001: Early stage cervical cancer. She underwent definitive treatment with radiation as well as intracavitary brachytherapy treatment using cesium under my direction at [facility]. She was followed for several years with no recurrence.  Narrative: [name] here for follow up. She denies having pain. She is requesting a refill on hydrocodone and xanax. She said she had a kyphoplasty on T5 on 12/06/15. She reports her back is feeling better. She reports a productive cough with green sputum. She denies hemoptysis. She had a low bp today, but she denies having breakfast this morning.  ALLERGIES:  is allergic to latex and codeine.  Meds: Current Outpatient Prescriptions  Medication Sig Dispense Refill  . albuterol (PROVENTIL HFA;VENTOLIN HFA) 108 (90 BASE) MCG/ACT inhaler Inhale 1 puff into the lungs every 6 (six) hours as needed for wheezing.    Marland Kitchen albuterol (PROVENTIL) (2.5 MG/3ML) 0.083% nebulizer solution Take 3 mLs (2.5 mg total) by nebulization 3 (three) times daily. 270 mL 11  . alendronate (FOSAMAX) 70 MG tablet Take 70 mg by mouth once a week. Saturdays.    . ALPRAZolam (XANAX) 0.5 MG tablet Take 0.5 tablets (0.25 mg total) by mouth 2 (two) times daily as needed for anxiety. 90 tablet 0  . Cholecalciferol (VITAMIN D3) 400 units CAPS Take 1 tablet by mouth 2 (two) times daily.    Marland Kitchen dexlansoprazole (DEXILANT) 60 MG capsule Take 60 mg by mouth daily as needed (for acid  reflux.).    Marland Kitchen gabapentin (NEURONTIN) 100 MG capsule Take 100 mg by mouth daily.    Marland Kitchen HYDROcodone-acetaminophen (NORCO) 5-325 MG tablet Take 1 tablet by mouth every 6 (six) hours as needed for severe pain. 60 tablet 0  . levothyroxine (SYNTHROID, LEVOTHROID) 125 MCG tablet Take 125 mcg by mouth daily before breakfast.    . OXYGEN Inhale into the lungs. 3 lpm with exertion    . budesonide (PULMICORT) 0.25 MG/2ML nebulizer solution Take 2 mLs (0.25 mg total) by nebulization 3 (three) times daily. (Patient not taking: Reported on 01/11/2016) 180 mL 11  . calcitonin, salmon, (MIACALCIN/FORTICAL) 200 UNIT/ACT nasal spray Place 1 spray into alternate nostrils daily.    Marland Kitchen ipratropium-albuterol (DUONEB) 0.5-2.5 (3) MG/3ML SOLN Take 3 mLs by nebulization 3 (three) times daily. (Patient not taking: Reported on 01/11/2016) 270 mL 11  . methocarbamol (ROBAXIN) 500 MG tablet Take 1 tablet (500 mg total) by mouth 3 (three) times daily. (Patient not taking: Reported on 01/11/2016) 30 tablet 0  . ondansetron (ZOFRAN ODT) 4 MG disintegrating tablet Take 1 tablet (4 mg total) by mouth every 8 (eight) hours as needed for nausea or vomiting. (Patient not taking: Reported on 01/11/2016) 20 tablet 0  . ondansetron (ZOFRAN) 4 MG tablet Take 1 tablet (4 mg total) by mouth every 8 (eight) hours as needed for nausea or vomiting. (Patient not taking: Reported on 01/11/2016) 20 tablet 0   No current facility-administered medications for this encounter.     Physical Findings: The patient is in  no acute distress. Patient is alert and oriented.  height is 5' 5.5" (1.664 m) and weight is 186 lb 3.2 oz (84.5 kg). Her oral temperature is 97.8 F (36.6 C). Her blood pressure is 88/63 (abnormal) and her pulse is 111 (abnormal). Her oxygen saturation is 96%. .  No palpable supraclavicular or axillary adenopathy. The lungs are clear to auscultation. The heart has regular rhythm and rate.  Patient presents with a nasal cannula at 2  L/minute.  Lab Findings: Lab Results  Component Value Date   WBC 6.8 12/25/2015   HGB 12.8 12/25/2015   HCT 39.1 12/25/2015   MCV 88.9 12/25/2015   PLT 193 12/25/2015    Radiographic Findings: Ct Chest W Contrast  Result Date: 12/29/2015 CLINICAL DATA:  Restaging lung cancer.  Status post chemotherapy. EXAM: CT CHEST WITH CONTRAST TECHNIQUE: Multidetector CT imaging of the chest was performed during intravenous contrast administration. CONTRAST:  68m ISOVUE-300 IOPAMIDOL (ISOVUE-300) INJECTION 61% COMPARISON:  CT chest 08/17/2015 FINDINGS: Cardiovascular: The heart size appears normal. No pericardial effusion identified. Aortic atherosclerosis noted. Calcification at the bifurcation of the left main coronary artery identified. Mediastinum/Nodes: The trachea was patent and midline. Normal appearance of the esophagus. There is no adenopathy within the mediastinum. Left hilar lymph node measures 1 cm, image 68 of series 2. Unchanged from previous exam. Increase soft tissue within the right perihilar region is stable when compared with study from 6/22/ 17 and is largely attributable to changes secondary to external beam radiation. There is a right hilar node which measures 9 mm, also unchanged from previous exam. Lungs/Pleura: Small loculated pleural effusion overlying the posterior right midlung is again noted and is equivocal E increased in volume from previous exam. Extensive scratch set architectural distortion scarring and fibrosis within the right upper and right mid lung zone is identified compatible with changes secondary to external beam radiation. There is a focal area of increasing subpleural nodular consolidation along the anterior right lung. On today's study this measures 1.5 x 1.7 cm, image number 36 of series 2. On the previous exam this measured 1.1 x 0.8 cm. Background of mosaic attenuation pattern and moderate changes of centrilobular emphysema noted. Upper Abdomen: Previous  cholecystectomy. There is no focal liver abnormality identified. There is a nodule identified within the which measures 1.4 cm, image 145 of series 2 and likely represents a benign adenoma. Musculoskeletal: Treated compression fracture involving T5 vertebra is again identified. No aggressive lytic or sclerotic bone lesions identified. IMPRESSION: 1. There is a focal area of subpleural nodularity within the anterior right lung which is increased in size compared with previous exam. Although findings may reflect continued evolutionary changes secondary to external beam radiation close interval follow-up to exclude recurrence of tumor is advise. 2. Similar appearance of increased right perihilar soft tissue which (based on PET-CT from 09/06/2015) is favored to represent changes secondary to external beam radiation. 3. Small loculated effusion overlying the posterior right lung is stable from previous study. 4. Treated compression fracture involves the T5 vertebra Electronically Signed   By: TKerby MoorsM.D.   On: 12/29/2015 15:21    Impression:  No evidence of recurrence on clinical exam today and recent chest CT scan. She'll require supplemental oxygen related to her COPD.  Plan: Follow-up hopefully on the same day as Dr. MEarlie Serverafter her chest CT in January. Recommended patient increase nasal cannula to 3 L/min when active. Advised patient to increase fluid intake to improve blood pressure. Xanax and hydrocodone prescription  refill today.      -----------------------------------  Blair Promise, PhD, MD  This document serves as a record of services personally performed by Gery Pray, MD. It was created on his behalf by Bethann Humble, a trained medical scribe. The creation of this record is based on the scribe's personal observations and the provider's statements to them. This document has been checked and approved by the attending provider.

## 2016-01-11 NOTE — Progress Notes (Addendum)
Dawn Mercer is here for follow up.  She denies having pain.  She is requesting a refill on hydrocodone and xanax.  She said she had a kyphoplasty on T11 on 12/06/15 and said that her back is feeling better.  She is using 2L of oxygen and said her breathing is worth with walking.  She reports having a cough with green sputum.  She denies having hemoptysis.  Her bp was low today at 88/63 with a heart rate of 95.   Her bp standing was also 88/63 with a hr 111.  She said that she did not eat breakfast this morning.   BP (!) 88/63 (BP Location: Left Arm, Patient Position: Standing)   Pulse (!) 111   Temp 97.8 F (36.6 C) (Oral)   Ht 5' 5.5" (1.664 m)   Wt 179 lb (81.2 kg)   SpO2 96% Comment: 2L  BMI 29.33 kg/m    Wt Readings from Last 3 Encounters:  01/11/16 186 lb 3.2 oz (84.5 kg)  01/01/16 189 lb 9.6 oz (86 kg)  12/06/15 192 lb (87.1 kg)

## 2016-02-05 ENCOUNTER — Ambulatory Visit (HOSPITAL_BASED_OUTPATIENT_CLINIC_OR_DEPARTMENT_OTHER): Payer: Medicare Other

## 2016-02-05 DIAGNOSIS — Z452 Encounter for adjustment and management of vascular access device: Secondary | ICD-10-CM

## 2016-02-05 DIAGNOSIS — C3411 Malignant neoplasm of upper lobe, right bronchus or lung: Secondary | ICD-10-CM

## 2016-02-05 DIAGNOSIS — Z85118 Personal history of other malignant neoplasm of bronchus and lung: Secondary | ICD-10-CM | POA: Diagnosis not present

## 2016-02-05 MED ORDER — HEPARIN SOD (PORK) LOCK FLUSH 100 UNIT/ML IV SOLN
500.0000 [IU] | Freq: Once | INTRAVENOUS | Status: AC | PRN
  Administered 2016-02-05: 500 [IU] via INTRAVENOUS
  Filled 2016-02-05: qty 5

## 2016-02-05 MED ORDER — SODIUM CHLORIDE 0.9 % IJ SOLN
10.0000 mL | INTRAMUSCULAR | Status: DC | PRN
  Administered 2016-02-05: 10 mL via INTRAVENOUS
  Filled 2016-02-05: qty 10

## 2016-03-21 ENCOUNTER — Ambulatory Visit: Payer: Medicare Other | Admitting: Pulmonary Disease

## 2016-03-27 ENCOUNTER — Ambulatory Visit (HOSPITAL_BASED_OUTPATIENT_CLINIC_OR_DEPARTMENT_OTHER): Payer: Medicare Other

## 2016-03-27 ENCOUNTER — Other Ambulatory Visit (HOSPITAL_BASED_OUTPATIENT_CLINIC_OR_DEPARTMENT_OTHER): Payer: Medicare Other

## 2016-03-27 ENCOUNTER — Ambulatory Visit (HOSPITAL_COMMUNITY)
Admission: RE | Admit: 2016-03-27 | Discharge: 2016-03-27 | Disposition: A | Discharge status: Home / Self Care | Payer: Medicare Other | Source: Ambulatory Visit | Attending: Internal Medicine | Admitting: Internal Medicine

## 2016-03-27 ENCOUNTER — Encounter (HOSPITAL_COMMUNITY): Payer: Self-pay

## 2016-03-27 DIAGNOSIS — Z452 Encounter for adjustment and management of vascular access device: Secondary | ICD-10-CM

## 2016-03-27 DIAGNOSIS — C3411 Malignant neoplasm of upper lobe, right bronchus or lung: Secondary | ICD-10-CM | POA: Diagnosis present

## 2016-03-27 DIAGNOSIS — J439 Emphysema, unspecified: Secondary | ICD-10-CM | POA: Diagnosis not present

## 2016-03-27 DIAGNOSIS — Z85118 Personal history of other malignant neoplasm of bronchus and lung: Secondary | ICD-10-CM | POA: Diagnosis not present

## 2016-03-27 DIAGNOSIS — Z9889 Other specified postprocedural states: Secondary | ICD-10-CM | POA: Insufficient documentation

## 2016-03-27 DIAGNOSIS — Z923 Personal history of irradiation: Secondary | ICD-10-CM | POA: Insufficient documentation

## 2016-03-27 DIAGNOSIS — J984 Other disorders of lung: Secondary | ICD-10-CM | POA: Insufficient documentation

## 2016-03-27 LAB — COMPREHENSIVE METABOLIC PANEL
ALT: 12 U/L (ref 0–55)
ANION GAP: 8 meq/L (ref 3–11)
AST: 17 U/L (ref 5–34)
Albumin: 3.4 g/dL — ABNORMAL LOW (ref 3.5–5.0)
Alkaline Phosphatase: 120 U/L (ref 40–150)
BUN: 10.4 mg/dL (ref 7.0–26.0)
CHLORIDE: 103 meq/L (ref 98–109)
CO2: 28 meq/L (ref 22–29)
Calcium: 9.3 mg/dL (ref 8.4–10.4)
Creatinine: 0.7 mg/dL (ref 0.6–1.1)
EGFR: 87 mL/min/{1.73_m2} — AB (ref >90)
GLUCOSE: 94 mg/dL (ref 70–140)
Potassium: 4.3 mEq/L (ref 3.5–5.1)
SODIUM: 139 meq/L (ref 136–145)
TOTAL PROTEIN: 7.2 g/dL (ref 6.4–8.3)
Total Bilirubin: 0.74 mg/dL (ref 0.20–1.20)

## 2016-03-27 LAB — CBC WITH DIFFERENTIAL/PLATELET
BASO%: 0.7 % (ref 0.0–2.0)
Basophils Absolute: 0 10*3/uL (ref 0.0–0.1)
EOS ABS: 0.2 10*3/uL (ref 0.0–0.5)
EOS%: 2.9 % (ref 0.0–7.0)
HCT: 38.5 % (ref 34.8–46.6)
HGB: 12.6 g/dL (ref 11.6–15.9)
LYMPH%: 21.5 % (ref 14.0–49.7)
MCH: 28.7 pg (ref 25.1–34.0)
MCHC: 32.8 g/dL (ref 31.5–36.0)
MCV: 87.5 fL (ref 79.5–101.0)
MONO#: 0.5 10*3/uL (ref 0.1–0.9)
MONO%: 6.7 % (ref 0.0–14.0)
NEUT%: 68.2 % (ref 38.4–76.8)
NEUTROS ABS: 4.9 10*3/uL (ref 1.5–6.5)
PLATELETS: 218 10*3/uL (ref 145–400)
RBC: 4.4 10*6/uL (ref 3.70–5.45)
RDW: 15.2 % — ABNORMAL HIGH (ref 11.2–14.5)
WBC: 7.1 10*3/uL (ref 3.9–10.3)
lymph#: 1.5 10*3/uL (ref 0.9–3.3)

## 2016-03-27 MED ORDER — IOPAMIDOL (ISOVUE-300) INJECTION 61%
INTRAVENOUS | Status: AC
  Administered 2016-03-27: 75 mL
  Filled 2016-03-27: qty 75

## 2016-03-27 MED ORDER — SODIUM CHLORIDE 0.9 % IJ SOLN
10.0000 mL | INTRAMUSCULAR | Status: DC | PRN
  Administered 2016-03-27: 10 mL via INTRAVENOUS
  Filled 2016-03-27: qty 10

## 2016-03-27 MED ORDER — HEPARIN SOD (PORK) LOCK FLUSH 100 UNIT/ML IV SOLN
INTRAVENOUS | Status: AC
  Filled 2016-03-27: qty 5

## 2016-03-27 MED ORDER — SODIUM CHLORIDE 0.9 % IJ SOLN
INTRAMUSCULAR | Status: AC
  Filled 2016-03-27: qty 50

## 2016-03-28 ENCOUNTER — Ambulatory Visit (HOSPITAL_COMMUNITY): Payer: Medicare Other

## 2016-04-01 ENCOUNTER — Encounter: Payer: Self-pay | Admitting: Internal Medicine

## 2016-04-01 ENCOUNTER — Telehealth: Payer: Self-pay | Admitting: Internal Medicine

## 2016-04-01 ENCOUNTER — Ambulatory Visit (HOSPITAL_BASED_OUTPATIENT_CLINIC_OR_DEPARTMENT_OTHER): Payer: Medicare Other | Admitting: Internal Medicine

## 2016-04-01 ENCOUNTER — Encounter: Payer: Self-pay | Admitting: *Deleted

## 2016-04-01 VITALS — BP 128/67 | HR 103 | Temp 97.5°F | Resp 18 | Ht 65.5 in | Wt 188.9 lb

## 2016-04-01 DIAGNOSIS — C3411 Malignant neoplasm of upper lobe, right bronchus or lung: Secondary | ICD-10-CM

## 2016-04-01 DIAGNOSIS — Z85118 Personal history of other malignant neoplasm of bronchus and lung: Secondary | ICD-10-CM

## 2016-04-01 DIAGNOSIS — J449 Chronic obstructive pulmonary disease, unspecified: Secondary | ICD-10-CM

## 2016-04-01 NOTE — Telephone Encounter (Signed)
Appointments scheduled per 2/5 LOS. Patient given AVS report and calendars with futures scheduled appointments.

## 2016-04-01 NOTE — Progress Notes (Signed)
Dawn Mercer [phone]   [phone]  OFFICE PROGRESS NOTE  Melinda Crutch, MD [place] [address]  DIAGNOSIS: Stage IIIA (T1b., N2, M0) non-small cell lung cancer, squamous cell carcinoma presented with right upper lobe lung nodule in addition to mediastinal lymphadenopathy diagnosed in November of 2014.  PRIOR THERAPY:  1) Concurrent chemoradiation with weekly carboplatin for AUC of 2 and paclitaxel 45 mg/M2, status post 8 cycles, last dose was given 03/15/2013 with partial response. 2) Consolidation chemotherapy with carboplatin for AUC of 5 and paclitaxel 175 mg/M2 every 3 weeks with Neulasta support, status post 3 cycles. First dose 05/03/2013. Last dose was given 06/14/2013.  CURRENT THERAPY: Observation.  CHEMOTHERAPY INTENT: Control/curative  CURRENT # OF CHEMOTHERAPY CYCLES: 0  CURRENT ANTIEMETICS: Zofran, dexamethasone and Compazine  CURRENT SMOKING STATUS: Former smoker.  ORAL CHEMOTHERAPY AND CONSENT: None  CURRENT BISPHOSPHONATES USE: None  PAIN MANAGEMENT: 0/10  NARCOTICS INDUCED CONSTIPATION: None  LIVING WILL AND CODE STATUS: Full code   INTERVAL HISTORY: Dawn Mercer 71 y.o. female came to the clinic today for follow-up visit. The patient is feeling fine today was no specific complaints except for the baseline shortness of breath and she is currently on home oxygen. She is followed by Dr. Lenna Gilford for her COPD. She denied having any chest pain but has mild cough with no hemoptysis. She denied having any significant weight loss or night sweats. She has no nausea, vomiting, diarrhea or constipation. She had repeat CT scan of the chest performed recently and she is here for evaluation and discussion of her scan results.   MEDICAL HISTORY: Past Medical History:  Diagnosis Date  . Anxiety   . Bronchitis    Hx: of  . 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 Claustrophobia     . COPD (chronic obstructive pulmonary disease) ([facility])   . GERD (gastroesophageal reflux disease)   . H/O hiatal hernia   . Hay fever    Hx: of  . History of radiation therapy 01/28/13-03/23/13   63 gray to right upper lobe  . Hypothyroidism   . IBS (irritable bowel syndrome)    Hx: of  . Lung cancer, upper lobe ([facility]) dx'd 12/2012   chemo and XRt complete  . 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 Pneumonia    years ago  . Seizures ([facility])    Hx: of over 40 years ago  . Shortness of breath    Hx: of with exertion  . Status post radiation therapy 10/1999  . 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  MEDICATIONS:  Current Outpatient Prescriptions  Medication Sig Dispense Refill  . albuterol (PROVENTIL HFA;VENTOLIN HFA) 108 (90 BASE) MCG/ACT inhaler Inhale 1 puff into the lungs every 6 (six) hours as needed for wheezing.    Marland Kitchen albuterol (PROVENTIL) (2.5 MG/3ML) 0.083% nebulizer solution Take 3 mLs (2.5 mg total) by nebulization 3 (three) times daily. 270 mL 11  . alendronate (FOSAMAX) 70 MG tablet Take 70 mg by mouth once a week. Saturdays.    . ALPRAZolam (XANAX) 0.5 MG tablet Take 0.5 tablets (0.25 mg total) by mouth 2 (two) times daily as needed for anxiety. (Patient taking differently: Take 0.25 mg by mouth 2 (two) times daily as needed for anxiety. Called in to [facility] per Dr. Sondra Come on 01/11/16.) 90 tablet  0  . budesonide (PULMICORT) 0.25 MG/2ML nebulizer solution Take 2 mLs (0.25 mg total) by nebulization 3 (three) times daily. (Patient not taking: Reported on 01/11/2016) 180 mL 11  . calcitonin, salmon, (MIACALCIN/FORTICAL) 200 UNIT/ACT nasal spray Place 1 spray into alternate nostrils daily.    . Cholecalciferol (VITAMIN D3) 400 units CAPS Take 1 tablet by mouth 2 (two) times daily.    Marland Kitchen dexlansoprazole (DEXILANT) 60 MG capsule Take 60 mg by mouth daily as needed (for acid reflux.).    Marland Kitchen gabapentin  (NEURONTIN) 100 MG capsule Take 100 mg by mouth daily.    Marland Kitchen HYDROcodone-acetaminophen (NORCO) 5-325 MG tablet Take 1 tablet by mouth every 6 (six) hours as needed for severe pain. 60 tablet 0  . ipratropium-albuterol (DUONEB) 0.5-2.5 (3) MG/3ML SOLN Take 3 mLs by nebulization 3 (three) times daily. (Patient not taking: Reported on 01/11/2016) 270 mL 11  . levothyroxine (SYNTHROID, LEVOTHROID) 125 MCG tablet Take 125 mcg by mouth daily before breakfast.    . methocarbamol (ROBAXIN) 500 MG tablet Take 1 tablet (500 mg total) by mouth 3 (three) times daily. (Patient not taking: Reported on 01/11/2016) 30 tablet 0  . ondansetron (ZOFRAN ODT) 4 MG disintegrating tablet Take 1 tablet (4 mg total) by mouth every 8 (eight) hours as needed for nausea or vomiting. (Patient not taking: Reported on 01/11/2016) 20 tablet 0  . ondansetron (ZOFRAN) 4 MG tablet Take 1 tablet (4 mg total) by mouth every 8 (eight) hours as needed for nausea or vomiting. (Patient not taking: Reported on 01/11/2016) 20 tablet 0  . OXYGEN Inhale into the lungs. 3 lpm with exertion     No current facility-administered medications for this visit.     SURGICAL HISTORY:  Past Surgical History:  Procedure Laterality Date  . CHOLECYSTECTOMY N/A 08/03/2013   Procedure: LAPAROSCOPIC CHOLECYSTECTOMY;  Surgeon: Harl Bowie, MD;  Location: [facility];  Service: General;  Laterality: N/A;  . COLONOSCOPY W/ BIOPSIES AND POLYPECTOMY     Hx: of  . KYPHOPLASTY N/A 12/06/2015   Procedure: THORACIC 5 KYPHOPLASTY;  Surgeon: Melina Schools, MD;  Location: [facility];  Service: Orthopedics;  Laterality: N/A;  . LASER ABLATION  04/2004   for VIN III  . PORTACATH PLACEMENT    . TUBAL LIGATION    . VIDEO BRONCHOSCOPY WITH ENDOBRONCHIAL NAVIGATION N/A 12/31/2012   Procedure: VIDEO BRONCHOSCOPY WITH ENDOBRONCHIAL NAVIGATION;  Surgeon: Melrose Nakayama, MD;  Location: [facility];  Service: Thoracic;  Laterality: N/A;  . VIDEO BRONCHOSCOPY WITH ENDOBRONCHIAL  ULTRASOUND N/A 12/31/2012   Procedure: VIDEO BRONCHOSCOPY WITH ENDOBRONCHIAL ULTRASOUND;  Surgeon: Melrose Nakayama, MD;  Location: [facility];  Service: Thoracic;  Laterality: N/A;    REVIEW OF SYSTEMS:  A comprehensive review of systems was negative except for: Respiratory: positive for cough and dyspnea on exertion   PHYSICAL EXAMINATION: General appearance: alert, cooperative, fatigued and no distress Head: Normocephalic, without obvious abnormality, atraumatic Neck: no adenopathy, no JVD, supple, symmetrical, trachea midline and thyroid not enlarged, symmetric, no tenderness/mass/nodules Lymph nodes: Cervical, supraclavicular, and axillary nodes normal. Resp: clear to auscultation bilaterally Back: symmetric, no curvature. ROM normal. No CVA tenderness. Cardio: regular rate and rhythm, S1, S2 normal, no murmur, click, rub or gallop GI: soft, non-tender; bowel sounds normal; no masses,  no organomegaly Extremities: extremities normal, atraumatic, no cyanosis or edema  ECOG PERFORMANCE STATUS: 1 - Symptomatic but completely ambulatory  Blood pressure 128/67, pulse (!) 103, temperature 97.5 F (36.4 C), temperature source Oral, resp. rate  18, height 5' 5.5" (1.664 m), weight 188 lb 14.4 oz (85.7 kg), SpO2 94 %.  LABORATORY DATA: Lab Results  Component Value Date   WBC 7.1 03/27/2016   HGB 12.6 03/27/2016   HCT 38.5 03/27/2016   MCV 87.5 03/27/2016   PLT 218 03/27/2016      Chemistry      Component Value Date/Time   NA 139 03/27/2016 0958   K 4.3 03/27/2016 0958   CL 104 11/29/2015 1306   CO2 28 03/27/2016 0958   BUN 10.4 03/27/2016 0958   CREATININE 0.7 03/27/2016 0958      Component Value Date/Time   CALCIUM 9.3 03/27/2016 0958   ALKPHOS 120 03/27/2016 0958   AST 17 03/27/2016 0958   ALT 12 03/27/2016 0958   BILITOT 0.74 03/27/2016 0958       RADIOGRAPHIC STUDIES: Ct Chest W Contrast  Result Date: 03/27/2016 CLINICAL DATA:  Restaging lung cancer. Initial  diagnosis 2014. Ongoing radiation. Shortness of breath. EXAM: CT CHEST WITH CONTRAST TECHNIQUE: Multidetector CT imaging of the chest was performed during intravenous contrast administration. CONTRAST:  57m ISOVUE-300 IOPAMIDOL (ISOVUE-300) INJECTION 61% COMPARISON:  12/29/2015 FINDINGS: Chest wall: Stable right-sided Port-A-Cath. No breast masses, supraclavicular or axillary lymphadenopathy. Cardiovascular: The heart is normal in size. No pericardial effusion. The aorta is normal in caliber. Stable advanced atherosclerotic calcifications. The branch vessels are patent. Advanced atherosclerotic calcifications. Stable Coronary artery calcifications. Mediastinum/Nodes: Small scattered mediastinal lymph nodes are unchanged. 7 mm nodule near the left carotid artery on image number 24 is unchanged. Smaller lymph node on the right side on image number 30 measures 5 mm. Stable soft tissue thickening in the right hilum with a maximum diameter of 32 mm. This was previously 32 mm. This is likely radiation change. Left infrahilar lymph node measures 10 mm on image 72 and is stable. The esophagus is grossly normal. Lungs/Pleura: Stable extensive radiation changes involving the right lung with loss of volume and scarring. There is a persistent small right effusion and subpleural atelectasis. The anterior right-sided pleural disease appears stable. The nodular component is unchanged measuring 16 x 15 mm on image number 44. Advanced emphysematous changes and pulmonary scarring. No new pulmonary lesions or findings suspicious for pulmonary metastatic disease. Upper Abdomen: No significant upper abdominal findings. Stable 14 mm rounded left adrenal gland nodule, most likely benign adenoma. The right adrenal gland is normal. No worrisome hepatic lesions. Fatty infiltration of the liver is noted. Musculoskeletal: Stable vertebral augmentation changes at T6 and stable superior endplate fracture of T7. IMPRESSION: Overall stable CT  appearance of the chest since recent prior examination in November 2017. Stable radiation changes. Stable matted soft tissue density in the right hilum and subpleural density involving the right anterior chest wall. I do not see findings suspicious for recurrent tumor but recommend continued surveillance. Stable emphysematous changes and pulmonary scarring. No significant upper abdominal findings. Stable vertebral augmentation changes involving T6 and stable superior endplate fracture of T7. Electronically Signed   By: PMarijo SanesM.D.   On: 03/27/2016 14:50   ASSESSMENT AND PLAN:  This is a very pleasant 71years old white female with history of stage IIIa non-small cell lung cancer, status post concurrent chemoradiation followed by consolidation chemotherapy with carboplatin and paclitaxel. The patient is currently on observation and feeling fine. The recent CT scan of the chest showed no evidence for disease progression. I discussed the scan results with the patient and her husband today. I recommended for her to continue  on observation with repeat CT scan of the chest in 6 months for restaging of her disease. The patient is also interested in removal of the Port-A-Cath and I will send her to interventional radiology for this procedure. She was advised to call immediately if she has any concerning symptoms in the interval. The patient voices understanding of current disease status and treatment options and is in agreement with the current care plan.  All questions were answered. The patient knows to call the clinic with any problems, questions or concerns. We can certainly see the patient much sooner if necessary. I spent 10 minutes counseling the patient face to face. The total time spent in the appointment was 15 minutes.  Disclaimer: This note was dictated with voice recognition software. Similar sounding words can inadvertently be transcribed and may not be corrected upon review.

## 2016-04-01 NOTE — Progress Notes (Signed)
Oncology Nurse Navigator Documentation  Oncology Nurse Navigator Flowsheets 04/01/2016  Navigator Location [facility]  Navigator Encounter Type Clinic/MDC/spoke with patient and husband today.  She is doing well and her recent scan shows no disease progression.  She is very happy. I listened as she explained.  No barriers identified at this time.   Patient Visit Type Follow-up  Treatment Phase Post-Tx Follow-up  Barriers/Navigation Needs No barriers at this time  Interventions None required  Time Spent with Patient 15

## 2016-04-09 ENCOUNTER — Other Ambulatory Visit: Payer: Self-pay | Admitting: Student

## 2016-04-09 MED ORDER — DEXTROSE 5 % IV SOLN: 2.0000 g | Freq: Once | INTRAVENOUS | Status: DC

## 2016-04-10 ENCOUNTER — Other Ambulatory Visit: Payer: Self-pay | Admitting: Radiology

## 2016-04-10 ENCOUNTER — Telehealth: Payer: Self-pay | Admitting: Pulmonary Disease

## 2016-04-10 NOTE — Telephone Encounter (Signed)
Pt returning call.Dawn Mercer ° °

## 2016-04-10 NOTE — Telephone Encounter (Signed)
lmtcb for pt.  

## 2016-04-10 NOTE — Telephone Encounter (Signed)
Per SN---  Ok to change providers if she would like.  SN recs Dr. Ashok Cordia.  Ok to set up appt with [facility] for the next 6 months for COPD and lung cancer

## 2016-04-10 NOTE — Telephone Encounter (Signed)
JN are you ok with taking on this patient?  Thanks!

## 2016-04-10 NOTE — Telephone Encounter (Signed)
SN  Please Advise-  Pt. Called in requesting to switch to another provider, please let us know who you recommend.

## 2016-04-11 ENCOUNTER — Ambulatory Visit (HOSPITAL_COMMUNITY)
Admission: RE | Admit: 2016-04-11 | Discharge: 2016-04-11 | Disposition: A | Discharge status: Home / Self Care | Payer: Medicare Other | Source: Ambulatory Visit | Attending: Internal Medicine | Admitting: Internal Medicine

## 2016-04-11 ENCOUNTER — Encounter (HOSPITAL_COMMUNITY): Payer: Self-pay

## 2016-04-11 DIAGNOSIS — Z8249 Family history of ischemic heart disease and other diseases of the circulatory system: Secondary | ICD-10-CM | POA: Diagnosis not present

## 2016-04-11 DIAGNOSIS — G8929 Other chronic pain: Secondary | ICD-10-CM | POA: Diagnosis not present

## 2016-04-11 DIAGNOSIS — Z452 Encounter for adjustment and management of vascular access device: Secondary | ICD-10-CM | POA: Diagnosis present

## 2016-04-11 DIAGNOSIS — Z801 Family history of malignant neoplasm of trachea, bronchus and lung: Secondary | ICD-10-CM | POA: Diagnosis not present

## 2016-04-11 DIAGNOSIS — F419 Anxiety disorder, unspecified: Secondary | ICD-10-CM | POA: Diagnosis not present

## 2016-04-11 DIAGNOSIS — K449 Diaphragmatic hernia without obstruction or gangrene: Secondary | ICD-10-CM | POA: Diagnosis not present

## 2016-04-11 DIAGNOSIS — C3490 Malignant neoplasm of unspecified part of unspecified bronchus or lung: Secondary | ICD-10-CM | POA: Insufficient documentation

## 2016-04-11 DIAGNOSIS — R079 Chest pain, unspecified: Secondary | ICD-10-CM | POA: Insufficient documentation

## 2016-04-11 DIAGNOSIS — R0989 Other specified symptoms and signs involving the circulatory and respiratory systems: Secondary | ICD-10-CM

## 2016-04-11 DIAGNOSIS — K219 Gastro-esophageal reflux disease without esophagitis: Secondary | ICD-10-CM | POA: Insufficient documentation

## 2016-04-11 DIAGNOSIS — Z87891 Personal history of nicotine dependence: Secondary | ICD-10-CM | POA: Insufficient documentation

## 2016-04-11 DIAGNOSIS — J449 Chronic obstructive pulmonary disease, unspecified: Secondary | ICD-10-CM | POA: Insufficient documentation

## 2016-04-11 DIAGNOSIS — K589 Irritable bowel syndrome without diarrhea: Secondary | ICD-10-CM | POA: Insufficient documentation

## 2016-04-11 DIAGNOSIS — C3411 Malignant neoplasm of upper lobe, right bronchus or lung: Secondary | ICD-10-CM

## 2016-04-11 DIAGNOSIS — E039 Hypothyroidism, unspecified: Secondary | ICD-10-CM | POA: Diagnosis not present

## 2016-04-11 HISTORY — PX: IR GENERIC HISTORICAL: IMG1180011

## 2016-04-11 HISTORY — DX: Other specified symptoms and signs involving the circulatory and respiratory systems: R09.89

## 2016-04-11 LAB — CBC
HCT: 37.5 % (ref 36.0–46.0)
Hemoglobin: 12.4 g/dL (ref 12.0–15.0)
MCH: 28.8 pg (ref 26.0–34.0)
MCHC: 33.1 g/dL (ref 30.0–36.0)
MCV: 87 fL (ref 78.0–100.0)
PLATELETS: 225 10*3/uL (ref 150–400)
RBC: 4.31 MIL/uL (ref 3.87–5.11)
RDW: 14.7 % (ref 11.5–15.5)
WBC: 7.2 10*3/uL (ref 4.0–10.5)

## 2016-04-11 LAB — APTT: APTT: 26 s (ref 24–36)

## 2016-04-11 LAB — PROTIME-INR
INR: 0.98
Prothrombin Time: 12.9 seconds (ref 11.4–15.2)

## 2016-04-11 MED ORDER — SODIUM CHLORIDE 0.9 % IV SOLN
INTRAVENOUS | Status: DC
  Administered 2016-04-11: 12:00:00 via INTRAVENOUS

## 2016-04-11 MED ORDER — FENTANYL CITRATE (PF) 100 MCG/2ML IJ SOLN
INTRAMUSCULAR | Status: AC | PRN
  Administered 2016-04-11 (×2): 50 ug via INTRAVENOUS

## 2016-04-11 MED ORDER — FENTANYL CITRATE (PF) 100 MCG/2ML IJ SOLN
INTRAMUSCULAR | Status: AC
  Filled 2016-04-11: qty 2

## 2016-04-11 MED ORDER — MIDAZOLAM HCL 2 MG/2ML IJ SOLN
INTRAMUSCULAR | Status: AC
  Filled 2016-04-11: qty 4

## 2016-04-11 MED ORDER — LIDOCAINE-EPINEPHRINE (PF) 2 %-1:200000 IJ SOLN
INTRAMUSCULAR | Status: AC
  Filled 2016-04-11: qty 20

## 2016-04-11 MED ORDER — CEFAZOLIN SODIUM-DEXTROSE 2-4 GM/100ML-% IV SOLN
2.0000 g | Freq: Once | INTRAVENOUS | Status: AC
  Administered 2016-04-11: 2 g via INTRAVENOUS
  Filled 2016-04-11: qty 100

## 2016-04-11 MED ORDER — LIDOCAINE-EPINEPHRINE (PF) 2 %-1:200000 IJ SOLN
INTRAMUSCULAR | Status: AC | PRN
  Administered 2016-04-11: 10 mL via INTRADERMAL

## 2016-04-11 MED ORDER — MIDAZOLAM HCL 2 MG/2ML IJ SOLN
INTRAMUSCULAR | Status: AC | PRN
  Administered 2016-04-11 (×2): 1 mg via INTRAVENOUS

## 2016-04-11 NOTE — Sedation Documentation (Signed)
Patient is resting comfortably. 

## 2016-04-11 NOTE — Discharge Instructions (Signed)
May remove bandage in 24 hours. Call MD for increased pain, fever or redness, drainage or swelling at incision site. Call 911 for emergenices.    Implanted Port Removal, Care After Introduction Refer to this sheet in the next few weeks. These instructions provide you with information about caring for yourself after your procedure. Your health care provider may also give you more specific instructions. Your treatment has been planned according to current medical practices, but problems sometimes occur. Call your health care provider if you have any problems or questions after your procedure. What can I expect after the procedure? After the procedure, it is common to have:  Soreness or pain near your incision.  Some swelling or bruising near your incision. Follow these instructions at home: Medicines  Take over-the-counter and prescription medicines only as told by your health care provider.  If you were prescribed an antibiotic medicine, take it as told by your health care provider. Do not stop taking the antibiotic even if you start to feel better. Bathing  Do not take baths, swim, or use a hot tub until your health care provider approves. Ask your health care provider if you can take showers. You may only be allowed to take sponge baths for bathing. Incision care  Follow instructions from your health care provider about how to take care of your incision. Make sure you:  Wash your hands with soap and water before you change your bandage (dressing). If soap and water are not available, use hand sanitizer.  Change your dressing as told by your health care provider.  Keep your dressing dry.  Leave stitches (sutures), skin glue, or adhesive strips in place. These skin closures may need to stay in place for 2 weeks or longer. If adhesive strip edges start to loosen and curl up, you may trim the loose edges. Do not remove adhesive strips completely unless your health care provider tells  you to do that.  Check your incision area every day for signs of infection. Check for:  More redness, swelling, or pain.  More fluid or blood.  Warmth.  Pus or a bad smell. Driving  If you received a sedative, do not drive for 24 hours after the procedure.  If you did not receive a sedative, ask your health care provider when it is safe to drive. Activity  Return to your normal activities as told by your health care provider. Ask your health care provider what activities are safe for you.  Until your health care provider says it is safe:  Do not lift anything that is heavier than 10 lb (4.5 kg).  Do not do activities that involve lifting your arms over your head. General instructions  Do not use any tobacco products, such as cigarettes, chewing tobacco, and e-cigarettes. Tobacco can delay healing. If you need help quitting, ask your health care provider.  Keep all follow-up visits as told by your health care provider. This is important. Contact a health care provider if:  You have more redness, swelling, or pain around your incision.  You have more fluid or blood coming from your incision.  Your incision feels warm to the touch.  You have pus or a bad smell coming from your incision.  You have a fever.  You have pain that is not relieved by your pain medicine. Get help right away if:  You have chest pain.  You have difficulty breathing. This information is not intended to replace advice given to you by your health  care provider. Make sure you discuss any questions you have with your health care provider. Document Released: 01/23/2015 Document Revised: 07/20/2015 Document Reviewed: 11/16/2014  2017 [employer]vier

## 2016-04-11 NOTE — Procedures (Signed)
R IJ port removal No complication No blood loss. See complete dictation in [facility].

## 2016-04-11 NOTE — Progress Notes (Signed)
Patient back to short stay post port removal. On 2LO2 at home. Patient currently 87% sat on 2L. She states she often runs in the high 80's for pulse ox at home on 2LO2. Dr. Vernard Gambles informed and stated fine for d/c at 1515 as long as sats remain at that level or higher.  Resting comfortably. Will review d/c instructions with patient and husband.

## 2016-04-11 NOTE — Consult Note (Signed)
Chief Complaint: Patient was seen in consultation today for a right Port a Cath removal  at the request of [facility]  Referring Physician(s): Mohamed,Mohamed  Supervising Physician: Arne Cleveland  Patient Status: [facility] - Out-pt  History of Present Illness: Dawn Mercer is a 71 y.o. female with history of stage IIIa non-small cell lung cancer, status post concurrent chemoradiation followed by consolidation chemotherapy with carboplatin and paclitaxel who presents to the IR service for consult for a right port a cath removal. She had the port placed on 12/2012. Recent CT scan of the chest showed no evidence for disease progression. She denies fever chills, N/V. She endorses chronic chest pain as a result of radiation treatments, and productive cough for 1 week for which she has been on an antibiotic since yesterday.  Past Medical History:  Diagnosis Date  . Anxiety   . Bronchitis    Hx: of  . 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 Chest congestion 04/11/2016  . Claustrophobia   . COPD (chronic obstructive pulmonary disease) ([place])   . GERD (gastroesophageal reflux disease)   . H/O hiatal hernia   . Hay fever    Hx: of  . History of radiation therapy 01/28/13-03/23/13   63 gray to right upper lobe  . Hypothyroidism   . IBS (irritable bowel syndrome)    Hx: of  . Lung cancer, upper lobe ([place]) dx'd 12/2012   chemo and XRt complete  . 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 Pneumonia    years ago  . Seizures ([place])    Hx: of over 40 years ago  . Shortness of breath    Hx: of with exertion  . Status post radiation therapy 10/1999  . 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 CHOLECYSTECTOMY N/A 08/03/2013   Procedure: LAPAROSCOPIC CHOLECYSTECTOMY;  Surgeon: Harl Bowie, MD;  Location: [place];  Service: General;  Laterality: N/A;  . COLONOSCOPY W/  BIOPSIES AND POLYPECTOMY     Hx: of  . KYPHOPLASTY N/A 12/06/2015   Procedure: THORACIC 5 KYPHOPLASTY;  Surgeon: Melina Schools, MD;  Location: [facility];  Service: Orthopedics;  Laterality: N/A;  . LASER ABLATION  04/2004   for VIN III  . PORTACATH PLACEMENT    . TUBAL LIGATION    . VIDEO BRONCHOSCOPY WITH ENDOBRONCHIAL NAVIGATION N/A 12/31/2012   Procedure: VIDEO BRONCHOSCOPY WITH ENDOBRONCHIAL NAVIGATION;  Surgeon: Melrose Nakayama, MD;  Location: [facility];  Service: Thoracic;  Laterality: N/A;  . VIDEO BRONCHOSCOPY WITH ENDOBRONCHIAL ULTRASOUND N/A 12/31/2012   Procedure: VIDEO BRONCHOSCOPY WITH ENDOBRONCHIAL ULTRASOUND;  Surgeon: Melrose Nakayama, MD;  Location: [facility];  Service: Thoracic;  Laterality: N/A;    Allergies: Latex and Codeine  Medications: Prior to Admission medications   Medication Sig Start Date End Date Taking? Authorizing Provider  albuterol (PROVENTIL HFA;VENTOLIN HFA) 108 (90 BASE) MCG/ACT inhaler Inhale 1 puff into the lungs every 6 (six) hours as needed for wheezing.   Yes Historical Provider, MD  ALPRAZolam [name]) 0.5 MG tablet Take 0.5 tablets (0.25 mg total) by mouth 2 (two) times daily as needed for anxiety. Patient taking differently: Take 0.25 mg by mouth 2 (two) times daily as needed for anxiety. Called in to [facility] per Dr. Sondra Come on 01/11/16. 01/11/16  Yes Gery Pray, MD  chlorpheniramine-HYDROcodone (TUSSIONEX) 10-8 MG/5ML SUER Take 5 mLs by mouth.  Yes Historical Provider, MD  Cholecalciferol (VITAMIN D3) 400 units CAPS Take 1 tablet by mouth 2 (two) times daily.   Yes Historical Provider, MD  gabapentin (NEURONTIN) 100 MG capsule Take 100 mg by mouth daily.   Yes Historical Provider, MD  ipratropium-albuterol (DUONEB) 0.5-2.5 (3) MG/3ML SOLN Take 3 mLs by nebulization 3 (three) times daily. 11/29/15  Yes Noralee Space, MD  levothyroxine (SYNTHROID, LEVOTHROID) 125 MCG tablet Take 125 mcg by mouth daily before breakfast.   Yes Historical Provider, MD    SPIRIVA HANDIHALER 18 MCG inhalation capsule  02/27/16  Yes Historical Provider, MD  alendronate (FOSAMAX) 70 MG tablet Take 70 mg by mouth once a week. Saturdays. 11/07/15   Historical Provider, MD  dexlansoprazole (DEXILANT) 60 MG capsule Take 60 mg by mouth daily as needed (for acid reflux.).    Historical Provider, MD  HYDROcodone-acetaminophen (NORCO) 5-325 MG tablet Take 1 tablet by mouth every 6 (six) hours as needed for severe pain. 01/11/16   Gery Pray, MD  methocarbamol (ROBAXIN) 500 MG tablet Take 1 tablet (500 mg total) by mouth 3 (three) times daily. 12/07/15   Carmen Christina Mayo, PA-C  ondansetron (ZOFRAN ODT) 4 MG disintegrating tablet Take 1 tablet (4 mg total) by mouth every 8 (eight) hours as needed for nausea or vomiting. 12/07/15   Carmen Christina Mayo, PA-C  ondansetron (ZOFRAN) 4 MG tablet Take 1 tablet (4 mg total) by mouth every 8 (eight) hours as needed for nausea or vomiting. 12/07/15   Carmen Christina Mayo, PA-C  OXYGEN Inhale into the lungs. 3 lpm with exertion    Historical Provider, MD  SYNTHROID 112 MCG tablet  02/01/16   Historical Provider, MD     Family History  Problem Relation Age of Onset  . Lung cancer Brother   . Heart disease Father   . Cervical cancer Sister   . Heart disease Brother   . Cancer Brother     small cell lung cancer    Social History   Social History  . Marital status: Married    Spouse name: N/A  . Number of children: N/A  . Years of education: N/A   Occupational History  . Retired    Social History Main Topics  . Smoking status: Former Smoker    Packs/day: 1.00    Years: 15.00    Quit date: 02/25/1998  . Smokeless tobacco: Never Used  . Alcohol use No  . Drug use: No  . Sexual activity: Not Currently   Other Topics Concern  . None   Social History Narrative  . None     Review of Systems: See HPI  Vital Signs: BP 134/85 (BP Location: Left Wrist)   Pulse 93   Temp 98.2 F (36.8 C) (Oral)   Resp 18    SpO2 94%   Physical Exam  Constitutional: She is oriented to person, place, and time. She appears well-developed and well-nourished. No distress.  HENT:  Head: Normocephalic and atraumatic.  Eyes: EOM are normal.  Neck: Normal range of motion.  Cardiovascular: Normal rate, regular rhythm and normal heart sounds.  Exam reveals no gallop and no friction rub.   No murmur heard. Pulmonary/Chest: Effort normal. She has wheezes. She has rales.  Fine scattered rales at the left lung base Diffuse expiratory wheezes bilaterally  Abdominal: Soft. Bowel sounds are normal.  Musculoskeletal: She exhibits no edema.  Neurological: She is alert and oriented to person, place, and time.  Psychiatric: She has a normal  mood and affect.    Mallampati Score:  MD Evaluation Airway: WNL Heart: WNL Abdomen: WNL Chest/ Lungs: WNL ASA  Classification: 3 Mallampati/Airway Score: Two  Imaging: Ct Chest W Contrast  Result Date: 03/27/2016 CLINICAL DATA:  Restaging lung cancer. Initial diagnosis 2014. Ongoing radiation. Shortness of breath. EXAM: CT CHEST WITH CONTRAST TECHNIQUE: Multidetector CT imaging of the chest was performed during intravenous contrast administration. CONTRAST:  23m ISOVUE-300 IOPAMIDOL (ISOVUE-300) INJECTION 61% COMPARISON:  12/29/2015 FINDINGS: Chest wall: Stable right-sided Port-A-Cath. No breast masses, supraclavicular or axillary lymphadenopathy. Cardiovascular: The heart is normal in size. No pericardial effusion. The aorta is normal in caliber. Stable advanced atherosclerotic calcifications. The branch vessels are patent. Advanced atherosclerotic calcifications. Stable Coronary artery calcifications. Mediastinum/Nodes: Small scattered mediastinal lymph nodes are unchanged. 7 mm nodule near the left carotid artery on image number 24 is unchanged. Smaller lymph node on the right side on image number 30 measures 5 mm. Stable soft tissue thickening in the right hilum with a maximum  diameter of 32 mm. This was previously 32 mm. This is likely radiation change. Left infrahilar lymph node measures 10 mm on image 72 and is stable. The esophagus is grossly normal. Lungs/Pleura: Stable extensive radiation changes involving the right lung with loss of volume and scarring. There is a persistent small right effusion and subpleural atelectasis. The anterior right-sided pleural disease appears stable. The nodular component is unchanged measuring 16 x 15 mm on image number 44. Advanced emphysematous changes and pulmonary scarring. No new pulmonary lesions or findings suspicious for pulmonary metastatic disease. Upper Abdomen: No significant upper abdominal findings. Stable 14 mm rounded left adrenal gland nodule, most likely benign adenoma. The right adrenal gland is normal. No worrisome hepatic lesions. Fatty infiltration of the liver is noted. Musculoskeletal: Stable vertebral augmentation changes at T6 and stable superior endplate fracture of T7. IMPRESSION: Overall stable CT appearance of the chest since recent prior examination in November 2017. Stable radiation changes. Stable matted soft tissue density in the right hilum and subpleural density involving the right anterior chest wall. I do not see findings suspicious for recurrent tumor but recommend continued surveillance. Stable emphysematous changes and pulmonary scarring. No significant upper abdominal findings. Stable vertebral augmentation changes involving T6 and stable superior endplate fracture of T7. Electronically Signed   By: PMarijo SanesM.D.   On: 03/27/2016 14:50    Labs:  CBC:  Recent Labs  11/29/15 1306 12/25/15 1113 03/27/16 0958 04/11/16 1156  WBC 7.4 6.8 7.1 7.2  HGB 13.2 12.8 12.6 12.4  HCT 40.2 39.1 38.5 37.5  PLT 198 193 218 225    COAGS: No results for input(s): INR, APTT in the last 8760 hours.  BMP:  Recent Labs  08/17/15 1110 11/13/15 1053 11/29/15 1306 12/25/15 1356 03/27/16 0958  NA 138  139 138 138 139  K 3.9 4.4 3.9 4.4 4.3  CL 105  --  104  --   --   CO2 '25 28 27 27 28  '$ GLUCOSE 125* 90 94 85 94  BUN 8 9.0 8 16.0 10.4  CALCIUM 8.8* 9.5 8.8* 9.4 9.3  CREATININE 0.80 0.7 0.61 0.7 0.7  GFRNONAA >60  --  >60  --   --   GFRAA >60  --  >60  --   --     LIVER FUNCTION TESTS:  Recent Labs  11/13/15 1053 12/25/15 1356 03/27/16 0958  BILITOT 0.78 0.80 0.74  AST '20 17 17  '$ ALT '15 15 12  '$ ALKPHOS  112 124 120  PROT 7.6 7.4 7.2  ALBUMIN 3.4* 3.3* 3.4*    TUMOR MARKERS: No results for input(s): AFPTM, CEA, CA199, CHROMGRNA in the last 8760 hours.  Assessment and Plan:  71 y.o. female with history of stage IIIa non-small cell lung cancer, status post concurrent chemoradiation followed by consolidation chemotherapy with carboplatin and paclitaxel who presents to the IR service for consult for a right port a cath removal. She had the port placed on 12/2012. Recent CT scan of the chest showed no evidence for disease progression. She denies fever chills, N/V. She endorses chronic chest pain as a result of radiation treatments, and productive cough for 1 week for which she has been on an antibiotic since yesterday.    Dr. Vernard Gambles has reviewed the case and has agreed to perform a right port removal this afternoon. Risks and benefits discussed with the patient including, but not limited to infection, bleeding, significant bleeding causing loss or decrease in renal function or damage to adjacent structures.  All of the patient's questions were answered, patient is agreeable to proceed. Consent signed and in chart.     Thank you for this interesting consult.  I greatly enjoyed meeting ELLENA KAMEN and look forward to participating in their care.  A copy of this report was sent to the requesting provider on this date.  Electronically Signed: Providence Crosby, PA-S 04/11/2016, 12:35 PM   I spent a total of 20 Minutes in face to face in clinical consultation,  greater than 50% of which was counseling/coordinating care for right Port a Cath removal.

## 2016-04-12 NOTE — Telephone Encounter (Signed)
Pt scheduled with JN on 08-15-16 @ 1:30. Pt aware and voiced her understanding. Nothing further needed.

## 2016-04-12 NOTE — Telephone Encounter (Signed)
Fine with me

## 2016-06-15 ENCOUNTER — Inpatient Hospital Stay (HOSPITAL_COMMUNITY)
Admission: EM | Admit: 2016-06-15 | Discharge: 2016-06-17 | DRG: 190 | Disposition: A | Discharge status: Home / Self Care | Payer: Medicare Other | Attending: Internal Medicine | Admitting: Internal Medicine

## 2016-06-15 ENCOUNTER — Emergency Department (HOSPITAL_COMMUNITY): Payer: Medicare Other

## 2016-06-15 ENCOUNTER — Encounter (HOSPITAL_COMMUNITY): Payer: Self-pay | Admitting: Emergency Medicine

## 2016-06-15 DIAGNOSIS — Z91040 Latex allergy status: Secondary | ICD-10-CM

## 2016-06-15 DIAGNOSIS — T451X5A Adverse effect of antineoplastic and immunosuppressive drugs, initial encounter: Secondary | ICD-10-CM | POA: Diagnosis present

## 2016-06-15 DIAGNOSIS — E039 Hypothyroidism, unspecified: Secondary | ICD-10-CM | POA: Diagnosis present

## 2016-06-15 DIAGNOSIS — K449 Diaphragmatic hernia without obstruction or gangrene: Secondary | ICD-10-CM | POA: Diagnosis present

## 2016-06-15 DIAGNOSIS — J9621 Acute and chronic respiratory failure with hypoxia: Secondary | ICD-10-CM | POA: Diagnosis present

## 2016-06-15 DIAGNOSIS — J441 Chronic obstructive pulmonary disease with (acute) exacerbation: Secondary | ICD-10-CM | POA: Diagnosis present

## 2016-06-15 DIAGNOSIS — R0902 Hypoxemia: Secondary | ICD-10-CM

## 2016-06-15 DIAGNOSIS — Z923 Personal history of irradiation: Secondary | ICD-10-CM | POA: Diagnosis not present

## 2016-06-15 DIAGNOSIS — Z9049 Acquired absence of other specified parts of digestive tract: Secondary | ICD-10-CM | POA: Diagnosis not present

## 2016-06-15 DIAGNOSIS — G629 Polyneuropathy, unspecified: Secondary | ICD-10-CM | POA: Diagnosis present

## 2016-06-15 DIAGNOSIS — K219 Gastro-esophageal reflux disease without esophagitis: Secondary | ICD-10-CM | POA: Diagnosis present

## 2016-06-15 DIAGNOSIS — J069 Acute upper respiratory infection, unspecified: Secondary | ICD-10-CM | POA: Diagnosis present

## 2016-06-15 DIAGNOSIS — C3411 Malignant neoplasm of upper lobe, right bronchus or lung: Secondary | ICD-10-CM | POA: Diagnosis present

## 2016-06-15 DIAGNOSIS — R000 Tachycardia, unspecified: Secondary | ICD-10-CM | POA: Diagnosis present

## 2016-06-15 DIAGNOSIS — M810 Age-related osteoporosis without current pathological fracture: Secondary | ICD-10-CM | POA: Diagnosis present

## 2016-06-15 DIAGNOSIS — F40240 Claustrophobia: Secondary | ICD-10-CM | POA: Diagnosis present

## 2016-06-15 DIAGNOSIS — Z8049 Family history of malignant neoplasm of other genital organs: Secondary | ICD-10-CM

## 2016-06-15 DIAGNOSIS — Z7983 Long term (current) use of bisphosphonates: Secondary | ICD-10-CM

## 2016-06-15 DIAGNOSIS — Z801 Family history of malignant neoplasm of trachea, bronchus and lung: Secondary | ICD-10-CM

## 2016-06-15 DIAGNOSIS — R599 Enlarged lymph nodes, unspecified: Secondary | ICD-10-CM | POA: Diagnosis present

## 2016-06-15 DIAGNOSIS — Z8249 Family history of ischemic heart disease and other diseases of the circulatory system: Secondary | ICD-10-CM

## 2016-06-15 DIAGNOSIS — Z79899 Other long term (current) drug therapy: Secondary | ICD-10-CM

## 2016-06-15 DIAGNOSIS — E876 Hypokalemia: Secondary | ICD-10-CM | POA: Diagnosis present

## 2016-06-15 DIAGNOSIS — R079 Chest pain, unspecified: Secondary | ICD-10-CM | POA: Diagnosis present

## 2016-06-15 DIAGNOSIS — Z9981 Dependence on supplemental oxygen: Secondary | ICD-10-CM

## 2016-06-15 DIAGNOSIS — J301 Allergic rhinitis due to pollen: Secondary | ICD-10-CM | POA: Diagnosis present

## 2016-06-15 DIAGNOSIS — C3410 Malignant neoplasm of upper lobe, unspecified bronchus or lung: Secondary | ICD-10-CM | POA: Diagnosis present

## 2016-06-15 DIAGNOSIS — Z9221 Personal history of antineoplastic chemotherapy: Secondary | ICD-10-CM

## 2016-06-15 DIAGNOSIS — K589 Irritable bowel syndrome without diarrhea: Secondary | ICD-10-CM | POA: Diagnosis present

## 2016-06-15 DIAGNOSIS — Z885 Allergy status to narcotic agent status: Secondary | ICD-10-CM

## 2016-06-15 DIAGNOSIS — Z87891 Personal history of nicotine dependence: Secondary | ICD-10-CM

## 2016-06-15 DIAGNOSIS — Z8541 Personal history of malignant neoplasm of cervix uteri: Secondary | ICD-10-CM

## 2016-06-15 LAB — CBC WITH DIFFERENTIAL/PLATELET
BASOS PCT: 0 %
Basophils Absolute: 0 10*3/uL (ref 0.0–0.1)
EOS PCT: 2 %
Eosinophils Absolute: 0.2 10*3/uL (ref 0.0–0.7)
HEMATOCRIT: 37.5 % (ref 36.0–46.0)
Hemoglobin: 12.6 g/dL (ref 12.0–15.0)
LYMPHS PCT: 24 %
Lymphs Abs: 2.2 10*3/uL (ref 0.7–4.0)
MCH: 29.2 pg (ref 26.0–34.0)
MCHC: 33.6 g/dL (ref 30.0–36.0)
MCV: 86.8 fL (ref 78.0–100.0)
MONO ABS: 0.5 10*3/uL (ref 0.1–1.0)
MONOS PCT: 5 %
NEUTROS ABS: 6.3 10*3/uL (ref 1.7–7.7)
Neutrophils Relative %: 69 %
PLATELETS: 158 10*3/uL (ref 150–400)
RBC: 4.32 MIL/uL (ref 3.87–5.11)
RDW: 14.5 % (ref 11.5–15.5)
WBC: 9.1 10*3/uL (ref 4.0–10.5)

## 2016-06-15 LAB — COMPREHENSIVE METABOLIC PANEL
ALBUMIN: 3.2 g/dL — AB (ref 3.5–5.0)
ALT: 15 U/L (ref 14–54)
ANION GAP: 10 (ref 5–15)
AST: 17 U/L (ref 15–41)
Alkaline Phosphatase: 70 U/L (ref 38–126)
BUN: 12 mg/dL (ref 6–20)
CALCIUM: 8.9 mg/dL (ref 8.9–10.3)
CO2: 28 mmol/L (ref 22–32)
Chloride: 99 mmol/L — ABNORMAL LOW (ref 101–111)
Creatinine, Ser: 0.72 mg/dL (ref 0.44–1.00)
GFR calc non Af Amer: >60 mL/min (ref >60)
GLUCOSE: 105 mg/dL — AB (ref 65–99)
POTASSIUM: 3.9 mmol/L (ref 3.5–5.1)
SODIUM: 137 mmol/L (ref 135–145)
Total Bilirubin: 0.7 mg/dL (ref 0.3–1.2)
Total Protein: 7.2 g/dL (ref 6.5–8.1)

## 2016-06-15 LAB — D-DIMER, QUANTITATIVE: D-Dimer, Quant: 1.47 ug/mL-FEU — ABNORMAL HIGH (ref 0.00–0.50)

## 2016-06-15 MED ORDER — ALBUTEROL (5 MG/ML) CONTINUOUS INHALATION SOLN
10.0000 mg/h | INHALATION_SOLUTION | Freq: Once | RESPIRATORY_TRACT | Status: AC
  Administered 2016-06-15: 10 mg/h via RESPIRATORY_TRACT
  Filled 2016-06-15: qty 20

## 2016-06-15 MED ORDER — FENTANYL CITRATE (PF) 100 MCG/2ML IJ SOLN
50.0000 ug | INTRAMUSCULAR | Status: DC | PRN
  Administered 2016-06-15: 50 ug via INTRAVENOUS
  Filled 2016-06-15: qty 2

## 2016-06-15 MED ORDER — METHYLPREDNISOLONE SODIUM SUCC 125 MG IJ SOLR
125.0000 mg | Freq: Once | INTRAMUSCULAR | Status: AC
  Administered 2016-06-15: 125 mg via INTRAVENOUS
  Filled 2016-06-15: qty 2

## 2016-06-15 MED ORDER — ONDANSETRON HCL 4 MG/2ML IJ SOLN
4.0000 mg | Freq: Once | INTRAMUSCULAR | Status: AC
  Administered 2016-06-15: 4 mg via INTRAVENOUS
  Filled 2016-06-15: qty 2

## 2016-06-15 MED ORDER — IOPAMIDOL (ISOVUE-370) INJECTION 76%
100.0000 mL | Freq: Once | INTRAVENOUS | Status: AC | PRN
  Administered 2016-06-15: 85 mL via INTRAVENOUS

## 2016-06-15 NOTE — ED Triage Notes (Signed)
Pt reports to having shortness of breath for the last week. Pt on home oxygen at time of triage and cough noted. Pt states that she has been having decreases in oxygen saturation with movement and pain in right side.

## 2016-06-15 NOTE — ED Provider Notes (Signed)
[facility] DEPT Provider Note   CSN: [id number] Arrival date & time: 06/15/16  2052     History   Chief Complaint Chief Complaint  Patient presents with  . Shortness of Breath    HPI Dawn Mercer is a 71 y.o. female.:  Chief complaint is "I just can't breathe".  HPI:  71 year old female. History of COPD. History of right upper lobe lung cancer status post 35 treatments of radiation therapy, and status post chemotherapy. In remission. Still following with oncology. Had her Infuse-a-Port catheter removed last week. States she has had a cold for a week with cough and runny nose and increased sputum. When she ambulates she is more short of breath and her oxygen saturation dropped with her home meter.  No fever. No anterior chest pain, but has sharp right pleuritic chest pain with coughing. She states it is somewhat reminiscent of the pain she had prior to her T5 kyphoplasty. The performed approximately 4-5 months ago.  Past Medical History:  Diagnosis Date  . Anxiety   . Bronchitis    Hx: of  . 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 Chest congestion 04/11/2016  . Claustrophobia   . COPD (chronic obstructive pulmonary disease) ([place])   . GERD (gastroesophageal reflux disease)   . H/O hiatal hernia   . Hay fever    Hx: of  . History of radiation therapy 01/28/13-03/23/13   63 gray to right upper lobe  . Hypothyroidism   . IBS (irritable bowel syndrome)    Hx: of  . Lung cancer, upper lobe ([place]) dx'd 12/2012   chemo and XRt complete  . 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 Pneumonia    years ago  . Seizures ([place])    Hx: of over 40 years ago  . Shortness of breath    Hx: of with exertion  . Status post radiation therapy 10/1999  . 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 Compression fracture of body of thoracic vertebra ([place]) 12/06/2015  . Compression  fracture of thoracic vertebra ([facility]) 11/20/2015  . Back pain, thoracic 11/20/2015  . COPD mixed type ([place]) 05/31/2015  . Chronic hypoxemic respiratory failure ([place]) 05/31/2015  . Dyspnea 05/31/2015  . Symptomatic cholelithiasis 07/23/2013  . History of cervical cancer 04/26/2013  . Malignant neoplasm of upper lobe of lung ([place]) 01/14/2013    Past Surgical History:  Procedure Laterality Date  . CHOLECYSTECTOMY N/A 08/03/2013   Procedure: LAPAROSCOPIC CHOLECYSTECTOMY;  Surgeon: Harl Bowie, MD;  Location: [facility];  Service: General;  Laterality: N/A;  . COLONOSCOPY W/ BIOPSIES AND POLYPECTOMY     Hx: of  . IR GENERIC HISTORICAL  04/11/2016   IR REMOVAL TUN ACCESS W/ PORT W/O FL MOD SED 04/11/2016 Arne Cleveland, MD [facility]-INTERV RAD  . KYPHOPLASTY N/A 12/06/2015   Procedure: THORACIC 5 KYPHOPLASTY;  Surgeon: Melina Schools, MD;  Location: [facility];  Service: Orthopedics;  Laterality: N/A;  . LASER ABLATION  04/2004   for VIN III  . PORTACATH PLACEMENT    . TUBAL LIGATION    . VIDEO BRONCHOSCOPY WITH ENDOBRONCHIAL NAVIGATION N/A 12/31/2012   Procedure: VIDEO BRONCHOSCOPY WITH ENDOBRONCHIAL NAVIGATION;  Surgeon: Melrose Nakayama, MD;  Location: [facility];  Service: Thoracic;  Laterality: N/A;  . VIDEO BRONCHOSCOPY WITH ENDOBRONCHIAL ULTRASOUND N/A 12/31/2012   Procedure: VIDEO BRONCHOSCOPY WITH ENDOBRONCHIAL ULTRASOUND;  Surgeon:  Melrose Nakayama, MD;  Location: [facility];  Service: Thoracic;  Laterality: N/A;    OB History    No data available       Home Medications    Prior to Admission medications   Medication Sig Start Date End Date Taking? Authorizing Provider  albuterol (PROVENTIL HFA;VENTOLIN HFA) 108 (90 BASE) MCG/ACT inhaler Inhale 1 puff into the lungs every 6 (six) hours as needed for wheezing.   Yes Historical Provider, MD  alendronate (FOSAMAX) 70 MG tablet Take 70 mg by mouth once a week. Saturdays. 11/07/15  Yes Historical Provider, MD  ALPRAZolam [name]) 0.5 MG tablet Take  0.5 tablets (0.25 mg total) by mouth 2 (two) times daily as needed for anxiety. Patient taking differently: Take 0.25 mg by mouth 2 (two) times daily as needed for anxiety. Called in to [facility] per Dr. Sondra Come on 01/11/16. 01/11/16  Yes Gery Pray, MD  chlorpheniramine-HYDROcodone (TUSSIONEX) 10-8 MG/5ML SUER Take 5 mLs by mouth at bedtime.    Yes Historical Provider, MD  Cholecalciferol (VITAMIN D3) 400 units CAPS Take 1 tablet by mouth 2 (two) times daily.   Yes Historical Provider, MD  dexlansoprazole (DEXILANT) 60 MG capsule Take 60 mg by mouth daily as needed (for acid reflux.).   Yes Historical Provider, MD  gabapentin (NEURONTIN) 100 MG capsule Take 100 mg by mouth daily.   Yes Historical Provider, MD  ipratropium-albuterol (DUONEB) 0.5-2.5 (3) MG/3ML SOLN Take 3 mLs by nebulization 3 (three) times daily. 11/29/15  Yes Noralee Space, MD  levofloxacin (LEVAQUIN) 500 MG tablet Take 500 mg by mouth daily. 06/10/16  Yes Historical Provider, MD  levothyroxine (SYNTHROID, LEVOTHROID) 125 MCG tablet Take 125 mcg by mouth daily before breakfast.   Yes Historical Provider, MD  OXYGEN Inhale into the lungs. 2 lpm with exertion   Yes Historical Provider, MD  [facility] HANDIHALER 18 MCG inhalation capsule  02/27/16  Yes Historical Provider, MD  HYDROcodone-acetaminophen (NORCO) 5-325 MG tablet Take 1 tablet by mouth every 6 (six) hours as needed for severe pain. Patient not taking: Reported on 06/15/2016 01/11/16   Gery Pray, MD  methocarbamol (ROBAXIN) 500 MG tablet Take 1 tablet (500 mg total) by mouth 3 (three) times daily. 12/07/15   Carmen Christina Mayo, PA-C  ondansetron (ZOFRAN ODT) 4 MG disintegrating tablet Take 1 tablet (4 mg total) by mouth every 8 (eight) hours as needed for nausea or vomiting. 12/07/15   Carmen Christina Mayo, PA-C  ondansetron (ZOFRAN) 4 MG tablet Take 1 tablet (4 mg total) by mouth every 8 (eight) hours as needed for nausea or vomiting. 12/07/15   Darla Lesches Mayo, PA-C      Family History Family History  Problem Relation Age of Onset  . Lung cancer Brother   . Heart disease Father   . Cervical cancer Sister   . Heart disease Brother   . Cancer Brother     small cell lung cancer    Social History Social History  Substance Use Topics  . Smoking status: Former Smoker    Packs/day: 1.00    Years: 15.00    Quit date: 02/25/1998  . Smokeless tobacco: Never Used  . Alcohol use No     Allergies   Latex and Codeine   Review of Systems Review of Systems  Constitutional: Positive for appetite change and fatigue. Negative for chills, diaphoresis and fever.  HENT: Positive for congestion, postnasal drip and rhinorrhea. Negative for mouth sores, sore throat and trouble swallowing.  States "I can't smell or taste so I have no appetite".  Eyes: Negative for visual disturbance.  Respiratory: Positive for cough, shortness of breath and wheezing. Negative for chest tightness.   Cardiovascular: Positive for chest pain.       Sharp, pleuritic right-sided.  Gastrointestinal: Negative for abdominal distention, abdominal pain, diarrhea, nausea and vomiting.  Endocrine: Negative for polydipsia, polyphagia and polyuria.  Genitourinary: Negative for dysuria, frequency and hematuria.  Musculoskeletal: Negative for gait problem.  Skin: Negative for color change, pallor and rash.  Neurological: Negative for dizziness, syncope, light-headedness and headaches.  Hematological: Does not bruise/bleed easily.  Psychiatric/Behavioral: Negative for behavioral problems and confusion.     Physical Exam Updated Vital Signs BP 121/76 (BP Location: Left Arm)   Pulse (!) 118   Temp 98 F (36.7 C) (Oral)   Resp (!) 28   Ht '5\' 6"'$  (1.676 m)   Wt 170 lb (77.1 kg)   SpO2 99%   BMI 27.44 kg/m   Physical Exam  Constitutional: She is oriented to person, place, and time. She appears well-developed and well-nourished. No distress.  HENT:  Head: Normocephalic.  Eyes:  Conjunctivae are normal. Pupils are equal, round, and reactive to light. No scleral icterus.  Neck: Normal range of motion. Neck supple. No thyromegaly present.  Cardiovascular: Normal rate and regular rhythm.  Exam reveals no gallop and no friction rub.   No murmur heard. Pulmonary/Chest: No respiratory distress. She has wheezes. She has no rales.  Tachypneic. Wheezing and prolongation in all fields. Saturating 96% on her home 2 L at rest.  Abdominal: Soft. Bowel sounds are normal. She exhibits no distension. There is no tenderness. There is no rebound.  Musculoskeletal: Normal range of motion.  Neurological: She is alert and oriented to person, place, and time.  Skin: Skin is warm and dry. No rash noted.  Psychiatric: She has a normal mood and affect. Her behavior is normal.     ED Treatments / Results  Labs (all labs ordered are listed, but only abnormal results are displayed) Labs Reviewed  COMPREHENSIVE METABOLIC PANEL - Abnormal; Notable for the following:       Result Value   Chloride 99 (*)    Glucose, Bld 105 (*)    Albumin 3.2 (*)    All other components within normal limits  D-DIMER, QUANTITATIVE (NOT AT [facility]) - Abnormal; Notable for the following:    D-Dimer, Quant 1.47 (*)    All other components within normal limits  CBC WITH DIFFERENTIAL/PLATELET    EKG  EKG Interpretation None       Radiology Dg Chest 2 View  Result Date: 06/15/2016 CLINICAL DATA:  Shortness of breath history of lung cancer EXAM: CHEST  2 VIEW COMPARISON:  CT 03/27/2016, chest x-ray 12/06/2015 FINDINGS: Hyperinflation with emphysematous changes again noted. Biapical right greater than left pleural thickening. Scarring an fibrosis in the right upper lobe, grossly unchanged. Removal of previously noted right-sided central venous catheter. No definite acute infiltrate or effusion. Mild cardiomegaly. Vertebral augmentation changes at C6. Superior endplate compression of T7 grossly stable compared  with prior CT. IMPRESSION: 1. Hyperinflation without acute infiltrate 2. Grossly unchanged pleural and parenchymal scarring/fibrosis in the right upper lobe. 3. Stable cardiomegaly. Electronically Signed   By: Donavan Foil M.D.   On: 06/15/2016 21:50   Ct Angio Chest Pe W Or Wo Contrast  Result Date: 06/15/2016 CLINICAL DATA:  Shortness of breath cough history of cervical cancer EXAM: CT ANGIOGRAPHY CHEST WITH CONTRAST  TECHNIQUE: Multidetector CT imaging of the chest was performed using the standard protocol during bolus administration of intravenous contrast. Multiplanar CT image reconstructions and MIPs were obtained to evaluate the vascular anatomy. CONTRAST:  85 mL Isovue 370 intravenous COMPARISON:  Chest x-ray 06/15/2016, CT chest 03/27/2016, 12/29/2015, 08/17/2015, PET-CT 09/06/2015 FINDINGS: Cardiovascular: Satisfactory opacification of the pulmonary arteries to the segmental level. No evidence of pulmonary embolism centrally. Truncated and attenuated appearance of right upper lobe pulmonary arterial branches. Non aneurysmal aorta. No dissection. Aortic atherosclerosis. Proximal and origin of the left common carotid artery appears narrowed. Mild stenosis suspected of the origin of the right brachiocephalic artery. Coronary artery calcification. No pericardial effusion. Mediastinum/Nodes: Trachea deviates slightly to the right as before. Mild air distention of the esophagus. Circumferential wall thickening of the midesophagus at the level of the carina is similar compared to previous exam. Increased sub- carinal lymph node, measuring 16 mm on axial views compared with 11 mm previously. Lungs/Pleura: Moderate emphysematous disease as before. Right upper pleural thickening, similar compared to previous CT. Nodular areas of scarring again noted. Soft tissue density is again evident at the right hilus, it encases right upper lobe pulmonary artery branches as well is bronchi. Soft tissue density at the right  hilar area appears slightly more prominent compared to prior, measuring 3.5 cm maximally on axial views compared to 3.2 cm previously. Upper Abdomen: No acute abnormality. Musculoskeletal: Vertebral augmentation at T5. Superior endplate compression at T6 as before. Review of the MIP images confirms the above findings. IMPRESSION: 1. Negative for acute pulmonary embolus or aortic dissection 2. Truncated and attenuated appearance of right upper lobe pulmonary artery branches. Again demonstrated is a right hilar soft tissue density that in cases right upper lobe pulmonary artery and bronchi. This appears slightly increased in size. Subcarinal adenopathy also appears slightly increased compared to prior CT; suggest correlation with PET-CT to evaluate for recurrent/metastatic disease. 3. Moderate severe emphysema. 4. Stable circumferential wall thickening of the midesophagus Electronically Signed   By: Donavan Foil M.D.   On: 06/15/2016 23:52    Procedures Procedures (including critical care time)  Medications Ordered in ED Medications  fentaNYL (SUBLIMAZE) injection 50 mcg (50 mcg Intravenous Given 06/15/16 2204)  albuterol (PROVENTIL,VENTOLIN) solution continuous neb (not administered)  methylPREDNISolone sodium succinate (SOLU-MEDROL) 125 mg/2 mL injection 125 mg (125 mg Intravenous Given 06/15/16 2203)  albuterol (PROVENTIL,VENTOLIN) solution continuous neb (10 mg/hr Nebulization Given 06/15/16 2148)  ondansetron (ZOFRAN) injection 4 mg (4 mg Intravenous Given 06/15/16 2204)  iopamidol (ISOVUE-370) 76 % injection 100 mL (85 mLs Intravenous Contrast Given 06/15/16 2259)     Initial Impression / Assessment and Plan / ED Course  I have reviewed the triage vital signs and the nursing notes.  Pertinent labs & imaging results that were available during my care of the patient were reviewed by me and considered in my medical decision making (see chart for details).     Plan albuterol, steroids, chest  x-ray. Differential diagnosis COPD exacerbation. Pneumonia. P. embolus. Doubt ACS. Clinically not in congestive heart failure. No edema no S4 gallop. We'll reevaluate after studies.  00:02:  CT reviewed. No PE. No acute infiltrate. On reexam she has 83% on HER-2 liters from home. She requires between 3 and 4 L to maintain 90-91%. We'll give additional nebulized albuterol. Has had one hour of continuous nebs. Feels and sounds improved. Does not appear to be distressed or require airway intervention/PPV at this time  Final Clinical Impressions(s) / ED Diagnoses  Final diagnoses:  COPD exacerbation ([place])  Hypoxia    CRITICAL CARE Performed by: Tanna Furry JOSEPH   Total critical care time: 60 minutes of continuous nebulized albuterol, with re-evaluations x 3 during this interval. Critical care time was exclusive of separately billable procedures and treating other patients.  Critical care was necessary to treat or prevent imminent or life-threatening deterioration.  Critical care was time spent personally by me on the following activities: development of treatment plan with patient and/or surrogate as well as nursing, discussions with consultants, evaluation of patient's response to treatment, examination of patient, obtaining history from patient or surrogate, ordering and performing treatments and interventions, ordering and review of laboratory studies, ordering and review of radiographic studies, pulse oximetry and re-evaluation of patient's condition.   New Prescriptions New Prescriptions   No medications on file     Tanna Furry, MD 06/16/16 0003

## 2016-06-16 ENCOUNTER — Encounter (HOSPITAL_COMMUNITY): Payer: Self-pay | Admitting: Internal Medicine

## 2016-06-16 DIAGNOSIS — R079 Chest pain, unspecified: Secondary | ICD-10-CM | POA: Diagnosis not present

## 2016-06-16 DIAGNOSIS — R0902 Hypoxemia: Secondary | ICD-10-CM

## 2016-06-16 DIAGNOSIS — E876 Hypokalemia: Secondary | ICD-10-CM

## 2016-06-16 DIAGNOSIS — E038 Other specified hypothyroidism: Secondary | ICD-10-CM

## 2016-06-16 DIAGNOSIS — E039 Hypothyroidism, unspecified: Secondary | ICD-10-CM | POA: Diagnosis present

## 2016-06-16 DIAGNOSIS — J9621 Acute and chronic respiratory failure with hypoxia: Secondary | ICD-10-CM | POA: Diagnosis present

## 2016-06-16 DIAGNOSIS — C3411 Malignant neoplasm of upper lobe, right bronchus or lung: Secondary | ICD-10-CM

## 2016-06-16 DIAGNOSIS — J441 Chronic obstructive pulmonary disease with (acute) exacerbation: Secondary | ICD-10-CM | POA: Diagnosis present

## 2016-06-16 DIAGNOSIS — K219 Gastro-esophageal reflux disease without esophagitis: Secondary | ICD-10-CM | POA: Diagnosis not present

## 2016-06-16 LAB — CBC
HEMATOCRIT: 33.5 % — AB (ref 36.0–46.0)
HEMOGLOBIN: 11 g/dL — AB (ref 12.0–15.0)
MCH: 28.7 pg (ref 26.0–34.0)
MCHC: 32.8 g/dL (ref 30.0–36.0)
MCV: 87.5 fL (ref 78.0–100.0)
Platelets: 156 10*3/uL (ref 150–400)
RBC: 3.83 MIL/uL — AB (ref 3.87–5.11)
RDW: 14.7 % (ref 11.5–15.5)
WBC: 7.7 10*3/uL (ref 4.0–10.5)

## 2016-06-16 LAB — URINALYSIS, ROUTINE W REFLEX MICROSCOPIC
BACTERIA UA: NONE SEEN
BILIRUBIN URINE: NEGATIVE
Glucose, UA: >=500 mg/dL — AB
Hgb urine dipstick: NEGATIVE
Ketones, ur: NEGATIVE mg/dL
Nitrite: NEGATIVE
PH: 5 (ref 5.0–8.0)
PROTEIN: NEGATIVE mg/dL
Specific Gravity, Urine: >1.046 — ABNORMAL HIGH (ref 1.005–1.030)

## 2016-06-16 LAB — COMPREHENSIVE METABOLIC PANEL
ALBUMIN: 3 g/dL — AB (ref 3.5–5.0)
ALT: 18 U/L (ref 14–54)
ANION GAP: 14 (ref 5–15)
AST: 37 U/L (ref 15–41)
Alkaline Phosphatase: 63 U/L (ref 38–126)
BUN: 15 mg/dL (ref 6–20)
CALCIUM: 8.5 mg/dL — AB (ref 8.9–10.3)
CHLORIDE: 95 mmol/L — AB (ref 101–111)
CO2: 22 mmol/L (ref 22–32)
Creatinine, Ser: 1.17 mg/dL — ABNORMAL HIGH (ref 0.44–1.00)
GFR calc non Af Amer: 46 mL/min — ABNORMAL LOW (ref >60)
GFR, EST AFRICAN AMERICAN: 54 mL/min — AB (ref >60)
GLUCOSE: 349 mg/dL — AB (ref 65–99)
POTASSIUM: 2.9 mmol/L — AB (ref 3.5–5.1)
Sodium: 131 mmol/L — ABNORMAL LOW (ref 135–145)
Total Bilirubin: 0.3 mg/dL (ref 0.3–1.2)
Total Protein: 7.1 g/dL (ref 6.5–8.1)

## 2016-06-16 LAB — TSH: TSH: 0.119 u[IU]/mL — AB (ref 0.350–4.500)

## 2016-06-16 LAB — MAGNESIUM: MAGNESIUM: 1.1 mg/dL — AB (ref 1.7–2.4)

## 2016-06-16 LAB — TROPONIN I
Troponin I: <0.03 ng/mL (ref <0.03)
Troponin I: <0.03 ng/mL (ref <0.03)

## 2016-06-16 LAB — PHOSPHORUS: PHOSPHORUS: 3 mg/dL (ref 2.5–4.6)

## 2016-06-16 MED ORDER — ALBUTEROL (5 MG/ML) CONTINUOUS INHALATION SOLN
10.0000 mg/h | INHALATION_SOLUTION | Freq: Once | RESPIRATORY_TRACT | Status: AC
  Administered 2016-06-16: 10 mg/h via RESPIRATORY_TRACT

## 2016-06-16 MED ORDER — GUAIFENESIN ER 600 MG PO TB12
600.0000 mg | ORAL_TABLET | Freq: Two times a day (BID) | ORAL | Status: DC
  Administered 2016-06-16: 600 mg via ORAL
  Filled 2016-06-16: qty 1

## 2016-06-16 MED ORDER — METHYLPREDNISOLONE SODIUM SUCC 125 MG IJ SOLR
60.0000 mg | Freq: Two times a day (BID) | INTRAMUSCULAR | Status: DC
  Administered 2016-06-16: 60 mg via INTRAVENOUS
  Filled 2016-06-16: qty 2

## 2016-06-16 MED ORDER — ENOXAPARIN SODIUM 40 MG/0.4ML ~~LOC~~ SOLN
40.0000 mg | SUBCUTANEOUS | Status: DC
  Filled 2016-06-16: qty 0.4

## 2016-06-16 MED ORDER — DOXYCYCLINE HYCLATE 100 MG IV SOLR
100.0000 mg | Freq: Two times a day (BID) | INTRAVENOUS | Status: DC
  Administered 2016-06-16: 100 mg via INTRAVENOUS
  Filled 2016-06-16 (×3): qty 100

## 2016-06-16 MED ORDER — ONDANSETRON HCL 4 MG/2ML IJ SOLN: 4.0000 mg | Freq: Four times a day (QID) | INTRAMUSCULAR | Status: DC | PRN

## 2016-06-16 MED ORDER — PANTOPRAZOLE SODIUM 40 MG PO TBEC
40.0000 mg | DELAYED_RELEASE_TABLET | Freq: Two times a day (BID) | ORAL | Status: DC
  Administered 2016-06-16 – 2016-06-17 (×3): 40 mg via ORAL
  Filled 2016-06-16 (×3): qty 1

## 2016-06-16 MED ORDER — POTASSIUM CHLORIDE CRYS ER 20 MEQ PO TBCR
40.0000 meq | EXTENDED_RELEASE_TABLET | ORAL | Status: AC
  Administered 2016-06-16 (×4): 40 meq via ORAL
  Filled 2016-06-16 (×4): qty 2

## 2016-06-16 MED ORDER — METHYLPREDNISOLONE SODIUM SUCC 125 MG IJ SOLR: 60.0000 mg | Freq: Two times a day (BID) | INTRAMUSCULAR | Status: DC

## 2016-06-16 MED ORDER — METHYLPREDNISOLONE SODIUM SUCC 125 MG IJ SOLR
125.0000 mg | Freq: Two times a day (BID) | INTRAMUSCULAR | Status: DC
  Administered 2016-06-16 – 2016-06-17 (×2): 125 mg via INTRAVENOUS
  Filled 2016-06-16 (×2): qty 2

## 2016-06-16 MED ORDER — ACETAMINOPHEN 650 MG RE SUPP: 650.0000 mg | Freq: Four times a day (QID) | RECTAL | Status: DC | PRN

## 2016-06-16 MED ORDER — SODIUM CHLORIDE 0.9% FLUSH
3.0000 mL | Freq: Two times a day (BID) | INTRAVENOUS | Status: DC
  Administered 2016-06-16 – 2016-06-17 (×4): 3 mL via INTRAVENOUS

## 2016-06-16 MED ORDER — ACETAMINOPHEN 325 MG PO TABS
650.0000 mg | ORAL_TABLET | Freq: Four times a day (QID) | ORAL | Status: DC | PRN
  Administered 2016-06-16 – 2016-06-17 (×3): 650 mg via ORAL
  Filled 2016-06-16 (×3): qty 2

## 2016-06-16 MED ORDER — ENOXAPARIN SODIUM 40 MG/0.4ML ~~LOC~~ SOLN: 40.0000 mg | SUBCUTANEOUS | Status: DC

## 2016-06-16 MED ORDER — BUDESONIDE 0.5 MG/2ML IN SUSP
0.5000 mg | Freq: Two times a day (BID) | RESPIRATORY_TRACT | Status: DC
  Administered 2016-06-16 – 2016-06-17 (×3): 0.5 mg via RESPIRATORY_TRACT
  Filled 2016-06-16 (×2): qty 2

## 2016-06-16 MED ORDER — DOXYCYCLINE HYCLATE 100 MG PO TABS
100.0000 mg | ORAL_TABLET | Freq: Two times a day (BID) | ORAL | Status: DC
  Administered 2016-06-16 – 2016-06-17 (×2): 100 mg via ORAL
  Filled 2016-06-16 (×2): qty 1

## 2016-06-16 MED ORDER — LEVALBUTEROL HCL 1.25 MG/0.5ML IN NEBU
1.2500 mg | INHALATION_SOLUTION | RESPIRATORY_TRACT | Status: DC | PRN
Start: 2016-06-16 — End: 2016-06-16
  Administered 2016-06-16: 1.25 mg via RESPIRATORY_TRACT
  Filled 2016-06-16: qty 0.5

## 2016-06-16 MED ORDER — PREDNISONE 20 MG PO TABS: 40.0000 mg | ORAL_TABLET | Freq: Every day | ORAL | Status: DC

## 2016-06-16 MED ORDER — ALPRAZOLAM 0.25 MG PO TABS: 0.2500 mg | ORAL_TABLET | Freq: Two times a day (BID) | ORAL | Status: DC | PRN

## 2016-06-16 MED ORDER — GABAPENTIN 100 MG PO CAPS
100.0000 mg | ORAL_CAPSULE | Freq: Every day | ORAL | Status: DC
  Administered 2016-06-16 – 2016-06-17 (×3): 100 mg via ORAL
  Filled 2016-06-16 (×2): qty 1

## 2016-06-16 MED ORDER — ONDANSETRON HCL 4 MG PO TABS: 4.0000 mg | ORAL_TABLET | Freq: Four times a day (QID) | ORAL | Status: DC | PRN

## 2016-06-16 MED ORDER — HYDROCODONE-ACETAMINOPHEN 5-325 MG PO TABS
1.0000 | ORAL_TABLET | ORAL | Status: DC | PRN
  Administered 2016-06-17: 1 via ORAL
  Filled 2016-06-16: qty 1

## 2016-06-16 MED ORDER — LEVOTHYROXINE SODIUM 25 MCG PO TABS
125.0000 ug | ORAL_TABLET | Freq: Every day | ORAL | Status: DC
  Administered 2016-06-16 – 2016-06-17 (×2): 125 ug via ORAL
  Filled 2016-06-16 (×2): qty 1

## 2016-06-16 MED ORDER — GUAIFENESIN ER 600 MG PO TB12
1200.0000 mg | ORAL_TABLET | Freq: Two times a day (BID) | ORAL | Status: DC
  Administered 2016-06-16 – 2016-06-17 (×3): 1200 mg via ORAL
  Filled 2016-06-16 (×3): qty 2

## 2016-06-16 MED ORDER — IPRATROPIUM-ALBUTEROL 0.5-2.5 (3) MG/3ML IN SOLN
3.0000 mL | Freq: Three times a day (TID) | RESPIRATORY_TRACT | Status: DC
  Administered 2016-06-16 – 2016-06-17 (×4): 3 mL via RESPIRATORY_TRACT
  Filled 2016-06-16 (×5): qty 3

## 2016-06-16 MED ORDER — MAGNESIUM SULFATE 2 GM/50ML IV SOLN
2.0000 g | Freq: Once | INTRAVENOUS | Status: AC
  Administered 2016-06-16: 2 g via INTRAVENOUS
  Filled 2016-06-16: qty 50

## 2016-06-16 MED ORDER — ALBUTEROL SULFATE (2.5 MG/3ML) 0.083% IN NEBU: 2.5000 mg | INHALATION_SOLUTION | RESPIRATORY_TRACT | Status: DC | PRN

## 2016-06-16 NOTE — ED Notes (Signed)
No respiratory or acute distress noted resting in bed with eyes closed no reaction to medication noted family at bedside call light in reach.

## 2016-06-16 NOTE — ED Notes (Signed)
Report called to [facility]

## 2016-06-16 NOTE — ED Notes (Signed)
No respiratory or acute distress noted alert and oriented x 3 able to speak in full sentences no reaction to medication noted blood drawn and sent to lab.

## 2016-06-16 NOTE — H&P (Signed)
Dawn Mercer WIO:[id number] DOB: 1945/09/25 DOA: 06/15/2016     PCP: Melinda Crutch, MD   Outpatient Specialists: Oncology Julien Nordmann, Pulmonology Teressa Lower Patient coming from:    home Lives   With family    Chief Complaint: Wheezing and shortness of breath  HPI: Dawn Mercer is a 71 y.o. female with medical history significant of non-small cell lung cancer, squamous cell carcinoma, COPD on 2lL of home O2, GERD, Hypothyroidism, IBS     Presented with 1 week hx of shortness of breath , cough,wheezing Right side chest pain worse with cough and movement. Associated with cold symptoms of runny nose and cough productive of sputum more short of breath on ambulation her oxygen requiring at baseline is 2% but she has required up to 3.5 she denies any fever or body aches. Reports husband had a cold last week. No flu contacts. Reports she went to PCP office 5 days ago and was started on Levaquin but it did not seem to help.  She was having hard time catching her breath ans speaking in full sentences. Currently on continuous NEB   Regarding pertinent Chronic problems: non-small cell lung cancer followed by oncology  diagnosed in November of 2014 sp Chemo and radiation therapy currently on observation sp port removal   IN ER:  Temp (24hrs), Avg:98 F (36.7 C), Min:98 F (36.7 C), Max:98 F (36.7 C)      RR 28 94% on  2L  LHR 118 BP 121 /76 WBC 9.1 Hg 12.6 plt 158 Na 137 K 3.9 Cr 0.72 Alb 3.2 D.dimer 1.47  CT A neg for PE but some progression right hilar soft tissue density  Following Medications were ordered in ER: Medications  fentaNYL (SUBLIMAZE) injection 50 mcg (50 mcg Intravenous Given 06/15/16 2204)  albuterol (PROVENTIL,VENTOLIN) solution continuous neb (not administered)  methylPREDNISolone sodium succinate (SOLU-MEDROL) 125 mg/2 mL injection 125 mg (125 mg Intravenous Given 06/15/16 2203)  albuterol (PROVENTIL,VENTOLIN) solution continuous neb (10 mg/hr Nebulization Given  06/15/16 2148)  ondansetron (ZOFRAN) injection 4 mg (4 mg Intravenous Given 06/15/16 2204)  iopamidol (ISOVUE-370) 76 % injection 100 mL (85 mLs Intravenous Contrast Given 06/15/16 2259)      Hospitalist was called for admission for acute on  chronic respiratory failure secondary to COPD exacerbation  Review of Systems:    Pertinent positives include: fatigue,chest pain shortness of breath at rest.  dyspnea on exertion excess mucus,  productive cough,  wheezing. Sneezing  nasal congestion,  Constitutional:  No weight loss, night sweats, Fevers, chills,  weight loss  HEENT:  No headaches, Difficulty swallowing,Tooth/dental problems,Sore throat,    itching, ear ache,post nasal drip,  Cardio-vascular:  No , Orthopnea, PND, anasarca, dizziness, palpitations.no Bilateral lower extremity swelling  GI:  No heartburn, indigestion, abdominal pain, nausea, vomiting, diarrhea, change in bowel habits, loss of appetite, melena, blood in stool, hematemesis Resp:    No coughing up of blood.No change in color of mucus.  Skin:  no rash or lesions. No jaundice GU:  no dysuria, change in color of urine, no urgency or frequency. No straining to urinate.  No flank pain.  Musculoskeletal:  No joint pain or no joint swelling. No decreased range of motion. No back pain.  Psych:  No change in mood or affect. No depression or anxiety. No memory loss.  Neuro: no localizing neurological complaints, no tingling, no weakness, no double vision, no gait abnormality, no slurred speech, no confusion  As per HPI otherwise 10 point review  of systems negative.   Past Medical History: Past Medical History:  Diagnosis Date  . Anxiety   . Bronchitis    Hx: of  . 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 Chest congestion 04/11/2016  . Claustrophobia   . COPD (chronic obstructive pulmonary disease) ([place])   . GERD (gastroesophageal reflux disease)   . H/O hiatal hernia   . Hay fever     Hx: of  . History of radiation therapy 01/28/13-03/23/13   63 gray to right upper lobe  . Hypothyroidism   . IBS (irritable bowel syndrome)    Hx: of  . Lung cancer, upper lobe ([place]) dx'd 12/2012   chemo and XRt complete  . 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 Pneumonia    years ago  . Seizures ([place])    Hx: of over 40 years ago  . Shortness of breath    Hx: of with exertion  . Status post radiation therapy 10/1999  . 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 CHOLECYSTECTOMY N/A 08/03/2013   Procedure: LAPAROSCOPIC CHOLECYSTECTOMY;  Surgeon: Harl Bowie, MD;  Location: [place];  Service: General;  Laterality: N/A;  . COLONOSCOPY W/ BIOPSIES AND POLYPECTOMY     Hx: of  . IR GENERIC HISTORICAL  04/11/2016   IR REMOVAL TUN ACCESS W/ PORT W/O FL MOD SED 04/11/2016 Arne Cleveland, MD [facility]-INTERV RAD  . KYPHOPLASTY N/A 12/06/2015   Procedure: THORACIC 5 KYPHOPLASTY;  Surgeon: Melina Schools, MD;  Location: [place];  Service: Orthopedics;  Laterality: N/A;  . LASER ABLATION  04/2004   for VIN III  . PORTACATH PLACEMENT    . TUBAL LIGATION    . VIDEO BRONCHOSCOPY WITH ENDOBRONCHIAL NAVIGATION N/A 12/31/2012   Procedure: VIDEO BRONCHOSCOPY WITH ENDOBRONCHIAL NAVIGATION;  Surgeon: Melrose Nakayama, MD;  Location: [place];  Service: Thoracic;  Laterality: N/A;  . VIDEO BRONCHOSCOPY WITH ENDOBRONCHIAL ULTRASOUND N/A 12/31/2012   Procedure: VIDEO BRONCHOSCOPY WITH ENDOBRONCHIAL ULTRASOUND;  Surgeon: Melrose Nakayama, MD;  Location: [place];  Service: Thoracic;  Laterality: N/A;     Social History:  Ambulatory independently      reports that she quit smoking about 18 years ago. She has a 15.00 pack-year smoking history. She has never used smokeless tobacco. She reports that she does not drink alcohol or use drugs.  Allergies:   Allergies  Allergen Reactions  . Latex Dermatitis  . Codeine Nausea Only        Family History:   Family History  Problem Relation Age of Onset  . Lung cancer Brother   . Heart disease Father   . Cervical cancer Sister   . Heart disease Brother   . Cancer Brother     small cell lung cancer    Medications: Prior to Admission medications   Medication Sig Start Date End Date Taking? Authorizing Provider  albuterol (PROVENTIL HFA;VENTOLIN HFA) 108 (90 BASE) MCG/ACT inhaler Inhale 1 puff into the lungs every 6 (six) hours as needed for wheezing.   Yes Historical Provider, MD  alendronate (FOSAMAX) 70 MG tablet Take 70 mg by mouth once a week. Saturdays. 11/07/15  Yes Historical Provider, MD  ALPRAZolam [name]) 0.5 MG tablet Take 0.5 tablets (0.25 mg total) by mouth 2 (two) times daily as needed for anxiety. Patient taking differently: Take 0.25 mg by mouth 2 (two) times daily as needed for anxiety.  Called in to [facility] per Dr. Sondra Come on 01/11/16. 01/11/16  Yes Gery Pray, MD  chlorpheniramine-HYDROcodone (TUSSIONEX) 10-8 MG/5ML SUER Take 5 mLs by mouth at bedtime.    Yes Historical Provider, MD  Cholecalciferol (VITAMIN D3) 400 units CAPS Take 1 tablet by mouth 2 (two) times daily.   Yes Historical Provider, MD  dexlansoprazole (DEXILANT) 60 MG capsule Take 60 mg by mouth daily as needed (for acid reflux.).   Yes Historical Provider, MD  gabapentin (NEURONTIN) 100 MG capsule Take 100 mg by mouth daily.   Yes Historical Provider, MD  ipratropium-albuterol (DUONEB) 0.5-2.5 (3) MG/3ML SOLN Take 3 mLs by nebulization 3 (three) times daily. 11/29/15  Yes Noralee Space, MD  levofloxacin (LEVAQUIN) 500 MG tablet Take 500 mg by mouth daily. 06/10/16  Yes Historical Provider, MD  levothyroxine (SYNTHROID, LEVOTHROID) 125 MCG tablet Take 125 mcg by mouth daily before breakfast.   Yes Historical Provider, MD  OXYGEN Inhale into the lungs. 2 lpm with exertion   Yes Historical Provider, MD  [facility] HANDIHALER 18 MCG inhalation capsule  02/27/16  Yes Historical Provider, MD   HYDROcodone-acetaminophen (NORCO) 5-325 MG tablet Take 1 tablet by mouth every 6 (six) hours as needed for severe pain. Patient not taking: Reported on 06/15/2016 01/11/16   Gery Pray, MD  methocarbamol (ROBAXIN) 500 MG tablet Take 1 tablet (500 mg total) by mouth 3 (three) times daily. 12/07/15   Carmen Christina Mayo, PA-C  ondansetron (ZOFRAN ODT) 4 MG disintegrating tablet Take 1 tablet (4 mg total) by mouth every 8 (eight) hours as needed for nausea or vomiting. 12/07/15   Carmen Christina Mayo, PA-C  ondansetron (ZOFRAN) 4 MG tablet Take 1 tablet (4 mg total) by mouth every 8 (eight) hours as needed for nausea or vomiting. 12/07/15   Valinda Hoar, PA-C    Physical Exam: Patient Vitals for the past 24 hrs:  BP Temp Temp src Pulse Resp SpO2 Height Weight  06/15/16 2148 - - - - - 99 % - -  06/15/16 2059 - - - - - - '5\' 6"'$  (1.676 m) 77.1 kg (170 lb)  06/15/16 2058 - - - - - 94 % - -  06/15/16 2057 121/76 98 F (36.7 C) Oral (!) 118 (!) 28 93 % - -    1. General:  in No Acute distress 2. Psychological: Alert and   Oriented 3. Head/ENT:     Dry Mucous Membranes                          Head Non traumatic, neck supple                           Poor Dentition 4. SKIN:  decreased Skin turgor,  Skin clean Dry and intact no rash 5. Heart: Regular rate and rhythm no  Murmur, Rub or gallop 6. Lungs  no wheezes or crackles   7. Abdomen: Soft,  non-tender, Non distended 8. Lower extremities: no clubbing, cyanosis, or edema 9. Neurologically Grossly intact, moving all 4 extremities equally   10. MSK: Normal range of motion   body mass index is 27.44 kg/m.  Labs on Admission:   Labs on Admission: I have personally reviewed following labs and imaging studies  CBC:  Recent Labs Lab 06/15/16 2157  WBC 9.1  NEUTROABS 6.3  HGB 12.6  HCT 37.5  MCV 86.8  PLT 440   Basic Metabolic Panel:  Recent Labs Lab 06/15/16 2157  NA 137  K 3.9  CL 99*  CO2 28  GLUCOSE 105*   BUN 12  CREATININE 0.72  CALCIUM 8.9   GFR: Estimated Creatinine Clearance: 69.6 mL/min (by C-G formula based on SCr of 0.72 mg/dL). Liver Function Tests:  Recent Labs Lab 06/15/16 2157  AST 17  ALT 15  ALKPHOS 70  BILITOT 0.7  PROT 7.2  ALBUMIN 3.2*   No results for input(s): LIPASE, AMYLASE in the last 168 hours. No results for input(s): AMMONIA in the last 168 hours. Coagulation Profile: No results for input(s): INR, PROTIME in the last 168 hours. Cardiac Enzymes: No results for input(s): CKTOTAL, CKMB, CKMBINDEX, TROPONINI in the last 168 hours. BNP (last 3 results) No results for input(s): PROBNP in the last 8760 hours. HbA1C: No results for input(s): HGBA1C in the last 72 hours. CBG: No results for input(s): GLUCAP in the last 168 hours. Lipid Profile: No results for input(s): CHOL, HDL, LDLCALC, TRIG, CHOLHDL, LDLDIRECT in the last 72 hours. Thyroid Function Tests: No results for input(s): TSH, T4TOTAL, FREET4, T3FREE, THYROIDAB in the last 72 hours. Anemia Panel: No results for input(s): VITAMINB12, FOLATE, FERRITIN, TIBC, IRON, RETICCTPCT in the last 72 hours. Urine analysis: No results found for: COLORURINE, APPEARANCEUR, LABSPEC, PHURINE, GLUCOSEU, HGBUR, BILIRUBINUR, KETONESUR, PROTEINUR, UROBILINOGEN, NITRITE, LEUKOCYTESUR Sepsis Labs: '@LABRCNTIP'$ (procalcitonin:4,lacticidven:4) )No results found for this or any previous visit (from the past 240 hour(s)).   UA  ordered  No results found for: HGBA1C  Estimated Creatinine Clearance: 69.6 mL/min (by C-G formula based on SCr of 0.72 mg/dL).  BNP (last 3 results) No results for input(s): PROBNP in the last 8760 hours.   ECG REPORT Not obtained  [facility] Weights   06/15/16 2059  Weight: 77.1 kg (170 lb)     Cultures: No results found for: SDES, [place], [place], REPTSTATUS   Radiological Exams on Admission: Dg Chest 2 View  Result Date: 06/15/2016 CLINICAL DATA:  Shortness of breath history of  lung cancer EXAM: CHEST  2 VIEW COMPARISON:  CT 03/27/2016, chest x-ray 12/06/2015 FINDINGS: Hyperinflation with emphysematous changes again noted. Biapical right greater than left pleural thickening. Scarring an fibrosis in the right upper lobe, grossly unchanged. Removal of previously noted right-sided central venous catheter. No definite acute infiltrate or effusion. Mild cardiomegaly. Vertebral augmentation changes at C6. Superior endplate compression of T7 grossly stable compared with prior CT. IMPRESSION: 1. Hyperinflation without acute infiltrate 2. Grossly unchanged pleural and parenchymal scarring/fibrosis in the right upper lobe. 3. Stable cardiomegaly. Electronically Signed   By: Donavan Foil M.D.   On: 06/15/2016 21:50   Ct Angio Chest Pe W Or Wo Contrast  Result Date: 06/15/2016 CLINICAL DATA:  Shortness of breath cough history of cervical cancer EXAM: CT ANGIOGRAPHY CHEST WITH CONTRAST TECHNIQUE: Multidetector CT imaging of the chest was performed using the standard protocol during bolus administration of intravenous contrast. Multiplanar CT image reconstructions and MIPs were obtained to evaluate the vascular anatomy. CONTRAST:  85 mL Isovue 370 intravenous COMPARISON:  Chest x-ray 06/15/2016, CT chest 03/27/2016, 12/29/2015, 08/17/2015, PET-CT 09/06/2015 FINDINGS: Cardiovascular: Satisfactory opacification of the pulmonary arteries to the segmental level. No evidence of pulmonary embolism centrally. Truncated and attenuated appearance of right upper lobe pulmonary arterial branches. Non aneurysmal aorta. No dissection. Aortic atherosclerosis. Proximal and origin of the left common carotid artery appears narrowed. Mild stenosis suspected of the origin of the right brachiocephalic artery. Coronary artery calcification. No pericardial effusion. Mediastinum/Nodes: Trachea deviates slightly to the  right as before. Mild air distention of the esophagus. Circumferential wall thickening of the  midesophagus at the level of the carina is similar compared to previous exam. Increased sub- carinal lymph node, measuring 16 mm on axial views compared with 11 mm previously. Lungs/Pleura: Moderate emphysematous disease as before. Right upper pleural thickening, similar compared to previous CT. Nodular areas of scarring again noted. Soft tissue density is again evident at the right hilus, it encases right upper lobe pulmonary artery branches as well is bronchi. Soft tissue density at the right hilar area appears slightly more prominent compared to prior, measuring 3.5 cm maximally on axial views compared to 3.2 cm previously. Upper Abdomen: No acute abnormality. Musculoskeletal: Vertebral augmentation at T5. Superior endplate compression at T6 as before. Review of the MIP images confirms the above findings. IMPRESSION: 1. Negative for acute pulmonary embolus or aortic dissection 2. Truncated and attenuated appearance of right upper lobe pulmonary artery branches. Again demonstrated is a right hilar soft tissue density that in cases right upper lobe pulmonary artery and bronchi. This appears slightly increased in size. Subcarinal adenopathy also appears slightly increased compared to prior CT; suggest correlation with PET-CT to evaluate for recurrent/metastatic disease. 3. Moderate severe emphysema. 4. Stable circumferential wall thickening of the midesophagus Electronically Signed   By: Donavan Foil M.D.   On: 06/15/2016 23:52    Chart has been reviewed    Assessment/Plan  71 y.o. female with medical history significant of non-small cell lung cancer, squamous cell carcinoma, COPD on 2lL of home O2, GERD, Hypothyroidism, IBS admitted for  acute on  chronic respiratory failure secondary to COPD exacerbation    Present on Admission: . Acute on chronic respiratory failure with hypoxia (HCC) secondary to COPD Will admit to step down appreciate BCC in consult in a.m. . COPD with acute exacerbation ([place]) -  Will initiate: Steroid taper  -  Antibiotics  Doxycycline, - XopenexPRN, - scheduled duoneb,  -  Breo or Dulera at discharge   -  Mucinex.  Titrate O2 to saturation >90%. Follow patients respiratory status. -   PCCM consulted for e-link monitoring,    Currently mentating well no evidence of symptomatic hypercarbia  . Malignant neoplasm of upper lobe of lung ([place]) - will add Oncology to care team to alert them that patient has been admitted. CT showing some progresion of lung mass discussed with patient's importance to follow-up with her oncologist   . Chest pain, most likely secondary to cough but given advanced age will cycle cardiac enzymes . Hypothyroidism continue home medications check TSH   Other plan as per orders.  DVT prophylaxis:    Lovenox     Code Status:  FULL CODE  as per patient    Family Communication:   Family  at  Bedside  plan of care was discussed with  Husband,  Disposition Plan:     To home once workup is complete and patient is stable                              Consults called: PCCM e-link and Will see in AM  Admission status:  inpatient       Level of care         SDU      I have spent a total of 66 min on this admission   extra time was spent to discuss case with PCCM  [place] 06/16/2016, 1:23 AM  [facility]  Pager [phone]   after 2 AM please page floor coverage PA If 7AM-7PM, please contact the day team taking care of the patient  [email or web address]  Password [email or web address]

## 2016-06-16 NOTE — Consult Note (Signed)
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 under observation only sp chemort last rx 2015 with gradual decline activity tol x 6 m she attributes to spine fx at which time placed on fosfamax with previous hx of GERD on dexilant (her hx/ not crosschecked to meds) to point where doe = MMRC3 = can't walk 100 yards even at a slow pace at a flat grade s stopping due to sob  On 2lpm baseline  Acutely worse since caught husband's cold a week PTA with congested cough / yellow mucus Says maintained on proair/spiriva and prn duoneb but didn't know how much could use it and panicked 10 pm  4/21 and came to ER s first trying the neb which she had last used 9 h prior.  Denies ongoing purulent sputum and comfortable at 30 degrees and 3lpm althouth required 8 l on arrival s/p nebs and mg sulphate/solumderol since arrival  No obvious day to day or daytime variability or assoc   mucus plugs or hemoptysis or cp or chest tightness, subjective wheeze or overt sinus or hb symptoms. No unusual exp hx or h/o childhood pna/ asthma or knowledge of premature birth.  Sleeping ok without nocturnal  or early am exacerbation  of respiratory  c/o's or need for noct saba. Also denies any obvious fluctuation of symptoms with weather or environmental changes or other aggravating or alleviating factors except as outlined above   Current Medications, Allergies, Complete Past Medical History, Past Surgical History, Family History, and Social History were reviewed in [employer] record.  ROS  The following are not active complaints unless bolded sore throat, dysphagia, dental problems, itching, sneezing,  nasal  congestion or excess/ purulent secretions, ear ache,   fever, chills, sweats, unintended wt loss, classically pleuritic or exertional cp,  orthopnea pnd or leg swelling, presyncope, palpitations, abdominal pain, anorexia, nausea, vomiting, diarrhea  or change in bowel or bladder habits, change in stools or urine, dysuria,hematuria,  rash, arthralgias, visual complaints, headache, numbness, weakness or ataxia or problems with walking or coordination,  change in mood/affect or memory.        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  PAST SURGICAL HISTORY: She  has a past surgical history that includes Laser ablation (04/2004); Colonoscopy w/ biopsies and polypectomy; Tubal ligation; Video bronchoscopy with endobronchial navigation (N/A, 12/31/2012); Video bronchoscopy with endobronchial ultrasound (N/A, 12/31/2012); Cholecystectomy (N/A, 08/03/2013); Portacath placement; Kyphoplasty (N/A, 12/06/2015); and ir generic historical (04/11/2016).  Allergies  Allergen Reactions  . Latex Dermatitis  . Codeine Nausea Only    No current facility-administered medications on file prior to encounter.    Current Outpatient Prescriptions on File Prior to Encounter  Medication Sig  . albuterol (PROVENTIL HFA;VENTOLIN HFA) 108 (90 BASE) MCG/ACT inhaler Inhale 1  puff into the lungs every 6 (six) hours as needed for wheezing.  Marland Kitchen alendronate (FOSAMAX) 70 MG tablet Take 70 mg by mouth once a  week. Saturdays.  . ALPRAZolam (XANAX) 0.5 MG tablet Take 0.5 tablets (0.25 mg total) by mouth 2 (two) times daily as needed for anxiety. (Patient taking differently: Take 0.25 mg by mouth 2 (two) times daily as needed for anxiety. Called in to [facility] per Dr. Sondra Come on 01/11/16.)  . chlorpheniramine-HYDROcodone (TUSSIONEX) 10-8 MG/5ML SUER Take 5 mLs by mouth at bedtime.   . Cholecalciferol (VITAMIN D3) 400 units CAPS Take 1 tablet by mouth 2 (two) times daily.  Marland Kitchen dexlansoprazole (DEXILANT) 60 MG capsule Take 60 mg by mouth daily as needed (for acid reflux.).  Marland Kitchen gabapentin (NEURONTIN) 100 MG capsule Take 100 mg by mouth daily.  Marland Kitchen ipratropium-albuterol (DUONEB) 0.5-2.5 (3) MG/3ML SOLN Take 3 mLs by nebulization 3 (three) times daily.  Marland Kitchen levothyroxine (SYNTHROID, LEVOTHROID) 125 MCG tablet Take 125 mcg by mouth daily before breakfast.  . OXYGEN Inhale into the lungs. 2 lpm with exertion  . SPIRIVA HANDIHALER 18 MCG inhalation capsule   . HYDROcodone-acetaminophen (NORCO) 5-325 MG tablet Take 1 tablet by mouth every 6 (six) hours as needed for severe pain. (Patient not taking: Reported on 06/15/2016)  . methocarbamol (ROBAXIN) 500 MG tablet Take 1 tablet (500 mg total) by mouth 3 (three) times daily.  . ondansetron (ZOFRAN ODT) 4 MG disintegrating tablet Take 1 tablet (4 mg total) by mouth every 8 (eight) hours as needed for nausea or vomiting.  . ondansetron (ZOFRAN) 4 MG tablet Take 1 tablet (4 mg total) by mouth every 8 (eight) hours as needed for nausea or vomiting.    FAMILY HISTORY:  Her indicated that the status of her father is unknown. She indicated that the status of her sister is unknown. She indicated that one of her three brothers is deceased.    SOCIAL HISTORY: She  reports that she quit smoking about 18 years ago. She has a 15.00 pack-year smoking history. She has never used smokeless tobacco. She reports that she does not drink alcohol or use drugs.     SUBJECTIVE:  Lying back  in bed at 30 degrees s sob/ cp  VITAL SIGNS: BP 103/73   Pulse (!) 116   Temp 98.4 F (36.9 C) (Oral)   Resp (!) 25   Ht '5\' 6"'$  (1.676 m)   Wt 170 lb (77.1 kg)   SpO2 92%   BMI 27.44 kg/m   HEMODYNAMICS:    VENTILATOR SETTINGS:    INTAKE / OUTPUT: No intake/output data recorded.  PHYSICAL EXAMINATION: General:  Chronically ill wf > stated age no increased wob  Wt Readings from Last 3 Encounters:  06/15/16 170 lb (77.1 kg)  04/01/16 188 lb 14.4 oz (85.7 kg)  01/11/16 186 lb 3.2 oz (84.5 kg)    Vital signs reviewed   Neuro:  Alert/ no def HEENT:  orpharynx clear Cardiovascular:  RRR no s3 or m Lungs:  insp and exp rhonchi diffusely, not localized Abdomen:  Obese/ soft with poor excursion in supine position Musculoskeletal:  No deformities Skin:  No rash/ breakdown   LABS:  BMET  Recent Labs Lab 06/15/16 2157 06/16/16 0500  NA 137 131*  K 3.9 2.9*  CL 99* 95*  CO2 28 22  BUN 12 15  CREATININE 0.72 1.17*  GLUCOSE 105* 349*    Electrolytes  Recent Labs Lab 06/15/16 2157 06/16/16 0500  CALCIUM 8.9 8.5*  MG  --  1.1*  PHOS  --  3.0    CBC  Recent Labs Lab 06/15/16 2157 06/16/16 0500  WBC 9.1 7.7  HGB 12.6 11.0*  HCT 37.5 33.5*  PLT 158 156    Coag's No results for input(s): APTT, INR in the last 168 hours.  Sepsis Markers No results for input(s): LATICACIDVEN, PROCALCITON, O2SATVEN in the last 168 hours.  ABG No results for input(s): PHART, PCO2ART, PO2ART in the last 168 hours.  Liver Enzymes  Recent Labs Lab 06/15/16 2157 06/16/16 0500  AST 17 37  ALT 15 18  ALKPHOS 70 63  BILITOT 0.7 0.3  ALBUMIN 3.2* 3.0*    Cardiac Enzymes  Recent Labs Lab 06/16/16 0500  TROPONINI <0.03    Glucose No results for input(s): GLUCAP in the last 168 hours.  Imaging/reviewed Dg Chest 2 View  Result Date: 06/15/2016 CLINICAL DATA:  Shortness of breath history of lung cancer EXAM: CHEST  2 VIEW COMPARISON:  CT 03/27/2016, chest  x-ray 12/06/2015 FINDINGS: Hyperinflation with emphysematous changes again noted. Biapical right greater than left pleural thickening. Scarring an fibrosis in the right upper lobe, grossly unchanged. Removal of previously noted right-sided central venous catheter. No definite acute infiltrate or effusion. Mild cardiomegaly. Vertebral augmentation changes at C6. Superior endplate compression of T7 grossly stable compared with prior CT. IMPRESSION: 1. Hyperinflation without acute infiltrate 2. Grossly unchanged pleural and parenchymal scarring/fibrosis in the right upper lobe. 3. Stable cardiomegaly. Electronically Signed   By: Donavan Foil M.D.   On: 06/15/2016 21:50   Ct Angio Chest Pe W Or Wo Contrast  Result Date: 06/15/2016 CLINICAL DATA:  Shortness of breath cough history of cervical cancer EXAM: CT ANGIOGRAPHY CHEST WITH CONTRAST TECHNIQUE: Multidetector CT imaging of the chest was performed using the standard protocol during bolus administration of intravenous contrast. Multiplanar CT image reconstructions and MIPs were obtained to evaluate the vascular anatomy. CONTRAST:  85 mL Isovue 370 intravenous COMPARISON:  Chest x-ray 06/15/2016, CT chest 03/27/2016, 12/29/2015, 08/17/2015, PET-CT 09/06/2015 FINDINGS: Cardiovascular: Satisfactory opacification of the pulmonary arteries to the segmental level. No evidence of pulmonary embolism centrally. Truncated and attenuated appearance of right upper lobe pulmonary arterial branches. Non aneurysmal aorta. No dissection. Aortic atherosclerosis. Proximal and origin of the left common carotid artery appears narrowed. Mild stenosis suspected of the origin of the right brachiocephalic artery. Coronary artery calcification. No pericardial effusion. Mediastinum/Nodes: Trachea deviates slightly to the right as before. Mild air distention of the esophagus. Circumferential wall thickening of the midesophagus at the level of the carina is similar compared to previous  exam. Increased sub- carinal lymph node, measuring 16 mm on axial views compared with 11 mm previously. Lungs/Pleura: Moderate emphysematous disease as before. Right upper pleural thickening, similar compared to previous CT. Nodular areas of scarring again noted. Soft tissue density is again evident at the right hilus, it encases right upper lobe pulmonary artery branches as well is bronchi. Soft tissue density at the right hilar area appears slightly more prominent compared to prior, measuring 3.5 cm maximally on axial views compared to 3.2 cm previously. Upper Abdomen: No acute abnormality. Musculoskeletal: Vertebral augmentation at T5. Superior endplate compression at T6 as before. Review of the MIP images confirms the above findings. IMPRESSION: 1. Negative for acute pulmonary embolus or aortic dissection 2. Truncated and attenuated appearance of right upper lobe pulmonary artery branches. Again demonstrated is a right hilar soft tissue density that in cases right upper lobe pulmonary artery and bronchi. This  appears slightly increased in size. Subcarinal adenopathy also appears slightly increased compared to prior CT; suggest correlation with PET-CT to evaluate for recurrent/metastatic disease. 3. Moderate severe emphysema. 4. Stable circumferential wall thickening of the midesophagus Electronically Signed   By: Donavan Foil M.D.   On: 06/15/2016 23:52           ASSESSMENT / PLAN:     1)  AECOPD in pt with underlying GOLD III severity/ prob URI - decline in activity tol x 6 months on fosfamax and not on laba /ics   rec stop fosfamax/ rx IV reclast once yearly (we can arrange in office)  symbicort 160 2bid/ change spiriva to respimat  on d/c but for now duoneb Otherwise usual rx for aecopd should suffice and no inpt f/u needed unless requested by [facility]   3) Acute on chronic hypoxemic RF  - should titrate 02 to lower 90's and f/u w/in 2 weeks of discharge with our NP Tammy Parrett with all  meds in hand using a trust but verify approach to confirm accurate Medication  Reconciliation The principal here is that until we are certain that the  patients are doing what we've asked, it makes no sense to ask them to do more.    3) IIIa Sq cell ca with slt increase in size since last study involving the RUL / hilum  - no localized wheeze on exam  Doubt involvement of the RUL has anything to do with her decline x 6 m in terms of activity tolerance or acute flare so can defer f/u to Oncology as planned, no role for fob this admit    Christinia Gully, MD Pulmonary and [place] [phone] After 5:30 PM or weekends, use [facility] [phone]

## 2016-06-16 NOTE — ED Notes (Signed)
Called Dr Ivor Costa about pts potassium 2.9 he states to call Dr Toy Baker no answer x 3.

## 2016-06-16 NOTE — Progress Notes (Signed)
[facility] PROGRESS NOTE  Dawn Mercer WEX:[id number] DOB: [birth year] DOA: 06/15/2016 PCP: Melinda Crutch, MD  Interim summary and HPI 71 y/o with PMH significant for non-small cell lung cancer, COPD, chronic resp failure, GERD, anxiety and hypothyroidism; who presented to ED with worsening SOB, wheezing, cough and chest discomfort.  Found to have COPD exacerbation and increased oxygen needs.  Assessment/Plan: 1-acute on chronic resp failure: due to COPD exacerbation and bronchiectasis  -patient using 4L of O2 currently -still with wheezing and positive rhonchi on exam -will continue steroids, nebulizer treatment and antibiotics -will add pulmicort and flutter valve -will follow clinical response and weaned oxygen down to her baseline (2L) as tolerated -neg PE on CTA  2-chest pain: pleuritic in nature -most likely associated with coughing/bronchitis and COPD exacerbation -will follow troponin and monitor on EKG -during my exam, expressed no further CP -EKG w/o acute ischemic abnormalities   3-hypokalemia/hypomagnesemia -most likely associated with continuous albuterol therapy -will replete potassium and magnesium -follow electrolytes trend -will monitor on telemetry   4-hypothyroidism -will follow TSH -continue synthroid   5-Malignant neoplasia of right upper lung/with subcarinal adenopathy seen on CTA -actively seen by Dr. Julien Nordmann -oncology made aware through EPIC -recommending correlation with PET-CT to assess for metastatic disease  6-GERD -continue PPI  7-anxiety -continue PRN xanax   Code Status: Full Family Communication: no family at bedside  Disposition Plan: breathing significantly improved, in comparison to initial presentation. Will admit to telemetry instead of stepdown (tele due to hypokalemia and tachycardia; patient also expressed chest discomfort during initial presentation). Will continue steroids, abx;s, nebulizer treatment and follow clinical  response.   Consultants:  PCCM (consulted by Dr. Roel Cluck on admission)  Procedures:  See below for x-ray reports   Antibiotics:  Doxycycline 4/22  HPI/Subjective: Patient without CP currently; tachypneic and requiring higher level of oxygen supplementation (than what she normally uses at home). Patient almost able to speak in full sentences.  Objective: Vitals:   06/16/16 0351 06/16/16 0544  BP: (!) 153/85 103/73  Pulse: (!) 116 (!) 116  Resp: (!) 24 (!) 25  Temp: 98.4 F (36.9 C)    No intake or output data in the 24 hours ending 06/16/16 0847 Filed Weights   06/15/16 2059  Weight: 77.1 kg (170 lb)    Exam:   General:  Afebrile, no CP. Patient with tachypnea, but not using accessory muscles. Feeling better and almost able to speak in full sentences currently (had received several continuous nebulizer treatment and steroids, prior to my evaluation). Patient using 4L of oxygen supplementation now, normally on 2L at home.  Cardiovascular: tachycardic, S1 and S2 appreciated, no rubs, no gallops   Respiratory: positive exp wheezing, tachypnea and rhonchi appreciated on exam. No using accessory muscles.  Abdomen: soft, NT, ND, positive BS  Musculoskeletal: no edema, no cyanosis   Data Reviewed: Basic Metabolic Panel:  Recent Labs Lab 06/15/16 2157 06/16/16 0500  NA 137 131*  K 3.9 2.9*  CL 99* 95*  CO2 28 22  GLUCOSE 105* 349*  BUN 12 15  CREATININE 0.72 1.17*  CALCIUM 8.9 8.5*  MG  --  1.1*  PHOS  --  3.0   Liver Function Tests:  Recent Labs Lab 06/15/16 2157 06/16/16 0500  AST 17 37  ALT 15 18  ALKPHOS 70 63  BILITOT 0.7 0.3  PROT 7.2 7.1  ALBUMIN 3.2* 3.0*   CBC:  Recent Labs Lab 06/15/16 2157 06/16/16 0500  WBC 9.1 7.7  NEUTROABS 6.3  --  HGB 12.6 11.0*  HCT 37.5 33.5*  MCV 86.8 87.5  PLT 158 156   Cardiac Enzymes:  Recent Labs Lab 06/16/16 0500  TROPONINI <0.03    Studies: Dg Chest 2 View  Result Date:  06/15/2016 CLINICAL DATA:  Shortness of breath history of lung cancer EXAM: CHEST  2 VIEW COMPARISON:  CT 03/27/2016, chest x-ray 12/06/2015 FINDINGS: Hyperinflation with emphysematous changes again noted. Biapical right greater than left pleural thickening. Scarring an fibrosis in the right upper lobe, grossly unchanged. Removal of previously noted right-sided central venous catheter. No definite acute infiltrate or effusion. Mild cardiomegaly. Vertebral augmentation changes at C6. Superior endplate compression of T7 grossly stable compared with prior CT. IMPRESSION: 1. Hyperinflation without acute infiltrate 2. Grossly unchanged pleural and parenchymal scarring/fibrosis in the right upper lobe. 3. Stable cardiomegaly. Electronically Signed   By: Donavan Foil M.D.   On: 06/15/2016 21:50   Ct Angio Chest Pe W Or Wo Contrast  Result Date: 06/15/2016 CLINICAL DATA:  Shortness of breath cough history of cervical cancer EXAM: CT ANGIOGRAPHY CHEST WITH CONTRAST TECHNIQUE: Multidetector CT imaging of the chest was performed using the standard protocol during bolus administration of intravenous contrast. Multiplanar CT image reconstructions and MIPs were obtained to evaluate the vascular anatomy. CONTRAST:  85 mL Isovue 370 intravenous COMPARISON:  Chest x-ray 06/15/2016, CT chest 03/27/2016, 12/29/2015, 08/17/2015, PET-CT 09/06/2015 FINDINGS: Cardiovascular: Satisfactory opacification of the pulmonary arteries to the segmental level. No evidence of pulmonary embolism centrally. Truncated and attenuated appearance of right upper lobe pulmonary arterial branches. Non aneurysmal aorta. No dissection. Aortic atherosclerosis. Proximal and origin of the left common carotid artery appears narrowed. Mild stenosis suspected of the origin of the right brachiocephalic artery. Coronary artery calcification. No pericardial effusion. Mediastinum/Nodes: Trachea deviates slightly to the right as before. Mild air distention of the  esophagus. Circumferential wall thickening of the midesophagus at the level of the carina is similar compared to previous exam. Increased sub- carinal lymph node, measuring 16 mm on axial views compared with 11 mm previously. Lungs/Pleura: Moderate emphysematous disease as before. Right upper pleural thickening, similar compared to previous CT. Nodular areas of scarring again noted. Soft tissue density is again evident at the right hilus, it encases right upper lobe pulmonary artery branches as well is bronchi. Soft tissue density at the right hilar area appears slightly more prominent compared to prior, measuring 3.5 cm maximally on axial views compared to 3.2 cm previously. Upper Abdomen: No acute abnormality. Musculoskeletal: Vertebral augmentation at T5. Superior endplate compression at T6 as before. Review of the MIP images confirms the above findings. IMPRESSION: 1. Negative for acute pulmonary embolus or aortic dissection 2. Truncated and attenuated appearance of right upper lobe pulmonary artery branches. Again demonstrated is a right hilar soft tissue density that in cases right upper lobe pulmonary artery and bronchi. This appears slightly increased in size. Subcarinal adenopathy also appears slightly increased compared to prior CT; suggest correlation with PET-CT to evaluate for recurrent/metastatic disease. 3. Moderate severe emphysema. 4. Stable circumferential wall thickening of the midesophagus Electronically Signed   By: Donavan Foil M.D.   On: 06/15/2016 23:52    Scheduled Meds: . budesonide (PULMICORT) nebulizer solution  0.5 mg Nebulization BID  . enoxaparin (LOVENOX) injection  40 mg Subcutaneous Q24H  . gabapentin  100 mg Oral Daily  . guaiFENesin  1,200 mg Oral BID  . ipratropium-albuterol  3 mL Nebulization TID  . levothyroxine  125 mcg Oral QAC breakfast  . methylPREDNISolone (SOLU-MEDROL)  injection  125 mg Intravenous Q12H  . pantoprazole  40 mg Oral BID AC  . potassium chloride   40 mEq Oral Q4H  . sodium chloride flush  3 mL Intravenous Q12H   Continuous Infusions: . doxycycline (VIBRAMYCIN) IV Stopped (06/16/16 0525)  . magnesium sulfate 1 - 4 g bolus IVPB      Active Problems:   Malignant neoplasm of upper lobe of lung (HCC)   Acute on chronic respiratory failure with hypoxia (HCC)   COPD with acute exacerbation (HCC)   COPD exacerbation (HCC)   Chest pain   Hypothyroidism    Time spent: 25 minutes    Barton Dubois  [facility] Pager [phone]. If 7PM-7AM, please contact night-coverage at [email or web address], password [facility] 06/16/2016, 8:47 AM  LOS: 0 days

## 2016-06-17 DIAGNOSIS — R0902 Hypoxemia: Secondary | ICD-10-CM | POA: Diagnosis not present

## 2016-06-17 DIAGNOSIS — J9621 Acute and chronic respiratory failure with hypoxia: Secondary | ICD-10-CM | POA: Diagnosis not present

## 2016-06-17 DIAGNOSIS — R079 Chest pain, unspecified: Secondary | ICD-10-CM | POA: Diagnosis not present

## 2016-06-17 DIAGNOSIS — J441 Chronic obstructive pulmonary disease with (acute) exacerbation: Secondary | ICD-10-CM | POA: Diagnosis not present

## 2016-06-17 DIAGNOSIS — E038 Other specified hypothyroidism: Secondary | ICD-10-CM | POA: Diagnosis not present

## 2016-06-17 DIAGNOSIS — K219 Gastro-esophageal reflux disease without esophagitis: Secondary | ICD-10-CM

## 2016-06-17 LAB — RESPIRATORY PANEL BY PCR
ADENOVIRUS-RVPPCR: NOT DETECTED
Bordetella pertussis: NOT DETECTED
CORONAVIRUS 229E-RVPPCR: NOT DETECTED
CORONAVIRUS NL63-RVPPCR: NOT DETECTED
CORONAVIRUS OC43-RVPPCR: NOT DETECTED
Chlamydophila pneumoniae: NOT DETECTED
Coronavirus HKU1: NOT DETECTED
Influenza A: NOT DETECTED
Influenza B: NOT DETECTED
Metapneumovirus: DETECTED — AB
Mycoplasma pneumoniae: NOT DETECTED
PARAINFLUENZA VIRUS 1-RVPPCR: NOT DETECTED
PARAINFLUENZA VIRUS 2-RVPPCR: NOT DETECTED
Parainfluenza Virus 3: NOT DETECTED
Parainfluenza Virus 4: NOT DETECTED
Respiratory Syncytial Virus: NOT DETECTED
Rhinovirus / Enterovirus: NOT DETECTED

## 2016-06-17 LAB — MAGNESIUM: MAGNESIUM: 1.7 mg/dL (ref 1.7–2.4)

## 2016-06-17 LAB — HEMOGLOBIN A1C
Hgb A1c MFr Bld: 5.7 % — ABNORMAL HIGH (ref 4.8–5.6)
Mean Plasma Glucose: 117 mg/dL

## 2016-06-17 MED ORDER — IPRATROPIUM-ALBUTEROL 0.5-2.5 (3) MG/3ML IN SOLN: 3.0000 mL | RESPIRATORY_TRACT | 3 refills | Status: DC | PRN

## 2016-06-17 MED ORDER — DOXYCYCLINE HYCLATE 100 MG PO TABS: 100.0000 mg | ORAL_TABLET | Freq: Two times a day (BID) | ORAL | 0 refills | Status: DC

## 2016-06-17 MED ORDER — BUDESONIDE-FORMOTEROL FUMARATE 160-4.5 MCG/ACT IN AERO: 2.0000 | INHALATION_SPRAY | Freq: Two times a day (BID) | RESPIRATORY_TRACT | 3 refills | Status: DC

## 2016-06-17 MED ORDER — PREDNISONE 20 MG PO TABS: ORAL_TABLET | ORAL | 0 refills | Status: DC

## 2016-06-17 NOTE — Discharge Summary (Signed)
Physician Discharge Summary  Dawn Mercer KZL:[id number] DOB: May 22, 1945 DOA: 06/15/2016  PCP: Melinda Crutch, MD  Admit date: 06/15/2016 Discharge date: 06/17/2016  Time spent: 35 minutes  Recommendations for Outpatient Follow-up:  Repeat BMET to follow electrolytes and renal function  Patient will follow up with pulmonary service for PFT's and adjustment on her medication regimen. Repeat full thyroid panel and adjust medications as needed   Discharge Diagnoses:  Acute on chronic resp failure with hypoxia COPD exacerbation Bronchiectasis Metapneumovirus infection  Malignant neoplasm of upper lobe of lung (HCC) Chest pain Hypothyroidism Gastroesophageal reflux disease   Discharge Condition: stable and improved. Patient discharge home with instruction to follow up with PCP and pulmonologist as instructed   Diet recommendation: heart healthy  Filed Weights   06/15/16 2059 06/16/16 1015  Weight: 77.1 kg (170 lb) 83.3 kg (183 lb 10.3 oz)    History of present illness:  71 y/o with PMH significant for non-small cell lung cancer, COPD, chronic resp failure, GERD, anxiety and hypothyroidism; who presented to ED with worsening SOB, wheezing, cough and chest discomfort. Found to have COPD exacerbation and increased oxygen needs.  Hospital Course:  1-acute on chronic resp failure: due to COPD exacerbation and bronchiectasis  -patient using and tolerating 2L of O2 currently (which is her baseline supplementation) -improved air movement and just minimally wheezing.  -per pulmonary rec's; will discharge on steroids tapering, completion of antibiotics and symbicort -patient will follow up with pulmonologist for PFT's and further adjustment on treatment. -neg PE on CTA -patient with viral infection from metapneumovirus; which most likely was also contributing to COPD exacerbation.  2-chest pain: pleuritic in nature -most likely associated with continue coughing/bronchitis and COPD  exacerbation -troponin neg -no further CP -no abnormalities on EKG or telemetry suggesting ischemia   3-hypokalemia/hypomagnesemia -most likely associated with continuous albuterol therapy -repleted -electrolytes improved and WNL at discharge  4-hypothyroidism -TSH 0.119 -continue synthroid for now; will need full thyroid panel reassessed and adjustment to be done for medications as needed.  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 Julien Nordmann -oncology made aware through [facility] -radiology recommendating correlation with PET-CT to assess for metastatic disease  6-GERD -continue PPI  7-anxiety -continue PRN xanax  8-osteoporosis -continue Vitd D and Calcium -per pulmonary rec's, stop fosamax   Procedures: See below for x-ray reprots   Consultations:  Pulmonary service  Oncology made aware of admission (Dr. Julien Nordmann)  Discharge Exam: Vitals:   06/17/16 0546 06/17/16 1400  BP: 123/62 118/65  Pulse: 71 79  Resp: 20   Temp:  98.1 F (36.7 C)     General:  Afebrile, no CP. Patient with significant improvement on her breathing and back to baseline oxygen supplementation. Denies  CP and is no longer tachypneic. Patient speaking in full sentences.   Cardiovascular: S1 and S2 appreciated, no rubs, no gallops   Respiratory:minimal exp wheezing, improved air movement, no crackles, no using accessory muscles.   Abdomen: soft, NT, ND, positive BS  Musculoskeletal: no edema, no cyanosis   Discharge Instructions   Discharge Instructions    Discharge instructions    Complete by:  As directed    Take medications as prescribed  Please follow up with PCP in 1 week Arrange follow up with pulmonary service in 2-3 weeks Decrease outdoor exposure  Keep yourself well hydrated     Current Discharge Medication List    START taking these medications   Details  budesonide-formoterol (SYMBICORT) 160-4.5  MCG/ACT  inhaler Inhale 2 puffs into the lungs 2 times daily at 12 noon and 4 pm. Qty: 1 Inhaler, Refills: 3    doxycycline (VIBRA-TABS) 100 MG tablet Take 1 tablet (100 mg total) by mouth every 12 (twelve) hours. Qty: 12 tablet, Refills: 0    predniSONE (DELTASONE) 20 MG tablet Take 3 tablets by mouth daily X1 day; then 2 tablets by mouth daily X 2 days; then 1 tablet by mouth daily X 2 days; then 1/2 tablet by mouth daily X 3 days and stop prednisone. Qty: 12 tablet, Refills: 0      CONTINUE these medications which have CHANGED   Details  ipratropium-albuterol (DUONEB) 0.5-2.5 (3) MG/3ML SOLN Take 3 mLs by nebulization every 4 (four) hours as needed. Qty: 270 mL, Refills: 3      CONTINUE these medications which have NOT CHANGED   Details  albuterol (PROVENTIL HFA;VENTOLIN HFA) 108 (90 BASE) MCG/ACT inhaler Inhale 1 puff into the lungs every 6 (six) hours as needed for wheezing.    ALPRAZolam (XANAX) 0.5 MG tablet Take 0.5 tablets (0.25 mg total) by mouth 2 (two) times daily as needed for anxiety. Qty: 90 tablet, Refills: 0    chlorpheniramine-HYDROcodone (TUSSIONEX) 10-8 MG/5ML SUER Take 5 mLs by mouth at bedtime.     Cholecalciferol (VITAMIN D3) 400 units CAPS Take 1 tablet by mouth 2 (two) times daily.    dexlansoprazole (DEXILANT) 60 MG capsule Take 60 mg by mouth daily as needed (for acid reflux.).    gabapentin (NEURONTIN) 100 MG capsule Take 100 mg by mouth daily.    levothyroxine (SYNTHROID, LEVOTHROID) 125 MCG tablet Take 125 mcg by mouth daily before breakfast.    OXYGEN Inhale into the lungs. 2 lpm with exertion    SPIRIVA HANDIHALER 18 MCG inhalation capsule    Associated Diagnoses: Malignant neoplasm of upper lobe of right lung ([place]); COPD mixed type (HCC)    HYDROcodone-acetaminophen (NORCO) 5-325 MG tablet Take 1 tablet by mouth every 6 (six) hours as needed for severe pain. Qty: 60 tablet, Refills: 0      STOP taking these medications     alendronate (FOSAMAX) 70  MG tablet      levofloxacin (LEVAQUIN) 500 MG tablet      methocarbamol (ROBAXIN) 500 MG tablet      ondansetron (ZOFRAN ODT) 4 MG disintegrating tablet      ondansetron (ZOFRAN) 4 MG tablet        Allergies  Allergen Reactions  . Latex Dermatitis  . Codeine Nausea Only   Follow-up Information    Melinda Crutch, MD. Schedule an appointment as soon as possible for a visit in 1 week(s).   Specialty:  Family Medicine Contact information: [place] [address] [phone]        Christinia Gully, MD. Schedule an appointment as soon as possible for a visit today.   Specialty:  Pulmonary Disease Why:  call office and make appointment in 2-3 weeks  Contact information: [address] [place] [address] [phone]            The results of significant diagnostics from this hospitalization (including imaging, microbiology, ancillary and laboratory) are listed below for reference.    Significant Diagnostic Studies: Dg Chest 2 View  Result Date: 06/15/2016 CLINICAL DATA:  Shortness of breath history of lung cancer EXAM: CHEST  2 VIEW COMPARISON:  CT 03/27/2016, chest x-ray 12/06/2015 FINDINGS: Hyperinflation with emphysematous changes again noted. Biapical right greater than left  pleural thickening. Scarring an fibrosis in the right upper lobe, grossly unchanged. Removal of previously noted right-sided central venous catheter. No definite acute infiltrate or effusion. Mild cardiomegaly. Vertebral augmentation changes at C6. Superior endplate compression of T7 grossly stable compared with prior CT. IMPRESSION: 1. Hyperinflation without acute infiltrate 2. Grossly unchanged pleural and parenchymal scarring/fibrosis in the right upper lobe. 3. Stable cardiomegaly. Electronically Signed   By: Donavan Foil M.D.   On: 06/15/2016 21:50   Ct Angio Chest Pe W Or Wo Contrast  Result Date: 06/15/2016 CLINICAL DATA:  Shortness of breath cough history of cervical cancer  EXAM: CT ANGIOGRAPHY CHEST WITH CONTRAST TECHNIQUE: Multidetector CT imaging of the chest was performed using the standard protocol during bolus administration of intravenous contrast. Multiplanar CT image reconstructions and MIPs were obtained to evaluate the vascular anatomy. CONTRAST:  85 mL Isovue 370 intravenous COMPARISON:  Chest x-ray 06/15/2016, CT chest 03/27/2016, 12/29/2015, 08/17/2015, PET-CT 09/06/2015 FINDINGS: Cardiovascular: Satisfactory opacification of the pulmonary arteries to the segmental level. No evidence of pulmonary embolism centrally. Truncated and attenuated appearance of right upper lobe pulmonary arterial branches. Non aneurysmal aorta. No dissection. Aortic atherosclerosis. Proximal and origin of the left common carotid artery appears narrowed. Mild stenosis suspected of the origin of the right brachiocephalic artery. Coronary artery calcification. No pericardial effusion. Mediastinum/Nodes: Trachea deviates slightly to the right as before. Mild air distention of the esophagus. Circumferential wall thickening of the midesophagus at the level of the carina is similar compared to previous exam. Increased sub- carinal lymph node, measuring 16 mm on axial views compared with 11 mm previously. Lungs/Pleura: Moderate emphysematous disease as before. Right upper pleural thickening, similar compared to previous CT. Nodular areas of scarring again noted. Soft tissue density is again evident at the right hilus, it encases right upper lobe pulmonary artery branches as well is bronchi. Soft tissue density at the right hilar area appears slightly more prominent compared to prior, measuring 3.5 cm maximally on axial views compared to 3.2 cm previously. Upper Abdomen: No acute abnormality. Musculoskeletal: Vertebral augmentation at T5. Superior endplate compression at T6 as before. Review of the MIP images confirms the above findings. IMPRESSION: 1. Negative for acute pulmonary embolus or aortic  dissection 2. Truncated and attenuated appearance of right upper lobe pulmonary artery branches. Again demonstrated is a right hilar soft tissue density that in cases right upper lobe pulmonary artery and bronchi. This appears slightly increased in size. Subcarinal adenopathy also appears slightly increased compared to prior CT; suggest correlation with PET-CT to evaluate for recurrent/metastatic disease. 3. Moderate severe emphysema. 4. Stable circumferential wall thickening of the midesophagus Electronically Signed   By: Donavan Foil M.D.   On: 06/15/2016 23:52    Microbiology: Recent Results (from the past 240 hour(s))  Respiratory Panel by PCR     Status: Abnormal   Collection Time: 06/16/16  5:22 PM  Result Value Ref Range Status   Adenovirus NOT DETECTED NOT DETECTED Final   Coronavirus 229E NOT DETECTED NOT DETECTED Final   Coronavirus HKU1 NOT DETECTED NOT DETECTED Final   Coronavirus NL63 NOT DETECTED NOT DETECTED Final   Coronavirus OC43 NOT DETECTED NOT DETECTED Final   Metapneumovirus DETECTED (A) NOT DETECTED Final   Rhinovirus / Enterovirus NOT DETECTED NOT DETECTED Final   Influenza A NOT DETECTED NOT DETECTED Final   Influenza B NOT DETECTED NOT DETECTED Final   Parainfluenza Virus 1 NOT DETECTED NOT DETECTED Final   Parainfluenza Virus 2 NOT DETECTED NOT DETECTED Final  Parainfluenza Virus 3 NOT DETECTED NOT DETECTED Final   Parainfluenza Virus 4 NOT DETECTED NOT DETECTED Final   Respiratory Syncytial Virus NOT DETECTED NOT DETECTED Final   Bordetella pertussis NOT DETECTED NOT DETECTED Final   Chlamydophila pneumoniae NOT DETECTED NOT DETECTED Final   Mycoplasma pneumoniae NOT DETECTED NOT DETECTED Final    Comment: Performed at [facility], [place] [address], [place], [place] [address]     Labs: Basic Metabolic Panel:  Recent Labs Lab 06/15/16 2157 06/16/16 0500 06/17/16 0537  NA 137 131*  --   K 3.9 2.9*  --   CL 99* 95*  --   CO2 28 22  --   GLUCOSE  105* 349*  --   BUN 12 15  --   CREATININE 0.72 1.17*  --   CALCIUM 8.9 8.5*  --   MG  --  1.1* 1.7  PHOS  --  3.0  --    Liver Function Tests:  Recent Labs Lab 06/15/16 2157 06/16/16 0500  AST 17 37  ALT 15 18  ALKPHOS 70 63  BILITOT 0.7 0.3  PROT 7.2 7.1  ALBUMIN 3.2* 3.0*   CBC:  Recent Labs Lab 06/15/16 2157 06/16/16 0500  WBC 9.1 7.7  NEUTROABS 6.3  --   HGB 12.6 11.0*  HCT 37.5 33.5*  MCV 86.8 87.5  PLT 158 156   Cardiac Enzymes:  Recent Labs Lab 06/16/16 0500 06/16/16 1154 06/16/16 1632  TROPONINI <0.03 <0.03 <0.03    Signed:  Barton Dubois MD.  [facility] 06/17/2016, 4:05 PM

## 2016-06-17 NOTE — Care Management Note (Signed)
Case Management Note  Patient Details  Name: Dawn Mercer MRN: [id number] Date of Birth: 08-06-1945  Subjective/Objective: 71 y/o f admitted w/Acute on chronic resp failure. Active w/[employer]-home 02-has travel tank. No CM needs.                   Action/Plan:d/c home.   Expected Discharge Date:  06/17/16               Expected Discharge Plan:  Home/Self Care  In-House Referral:     Discharge planning Services  CM Consult  Post Acute Care Choice:    Choice offered to:     DME Arranged:    DME Agency:     HH Arranged:    HH Agency:     Status of Service:  Completed, signed off  If discussed at [employer] of Stay Meetings, dates discussed:    Additional Comments:  Dessa Phi, RN 06/17/2016, 4:04 PM

## 2016-06-17 NOTE — Progress Notes (Signed)
Patient discharged home with husband, discharge instructions given and explained to patient/husband and they verbalized understanding, patient denies any pain/distress. Accompanied home by husband. No wound noted, skin intact.

## 2016-06-17 NOTE — Progress Notes (Signed)
Nutrition Brief Note  Patient identified per COPD Gold protocol.   Wt Readings from Last 15 Encounters:  06/16/16 183 lb 10.3 oz (83.3 kg)  04/01/16 188 lb 14.4 oz (85.7 kg)  01/11/16 186 lb 3.2 oz (84.5 kg)  01/01/16 189 lb 9.6 oz (86 kg)  12/06/15 192 lb (87.1 kg)  11/29/15 192 lb 1.6 oz (87.1 kg)  11/20/15 193 lb (87.5 kg)  10/02/15 197 lb 9.6 oz (89.6 kg)  09/18/15 198 lb 4.8 oz (89.9 kg)  08/24/15 197 lb 8 oz (89.6 kg)  08/17/15 187 lb (84.8 kg)  07/20/15 196 lb 8 oz (89.1 kg)  07/05/15 194 lb 8 oz (88.2 kg)  05/31/15 188 lb 6.4 oz (85.5 kg)  04/12/15 187 lb 11.2 oz (85.1 kg)    Body mass index is 29.64 kg/m. Patient meets criteria for overweight/borderline obesity based on current BMI. Skin WDL.   Pt with hx including NSCLC and COPD who was admitted for SOB, wheezing, cough, and chest discomfort (COPD exacerbation requiring increased O2).   Current diet order is Heart Healthy, patient is consuming approximately 100% of meals at this time. Breakfast and lunch today provided ~1590 kcal and 70 grams of protein. Labs and medications reviewed.   No nutrition interventions warranted at this time. If nutrition issues arise, please consult RD.     Jarome Matin, MS, RD, LDN, [facility] Inpatient Clinical Dietitian Pager # [phone] After hours/weekend pager # [phone]

## 2016-06-20 ENCOUNTER — Emergency Department (HOSPITAL_COMMUNITY)
Admission: EM | Admit: 2016-06-20 | Discharge: 2016-06-20 | Disposition: A | Discharge status: Home / Self Care | Payer: Medicare Other | Attending: Emergency Medicine | Admitting: Emergency Medicine

## 2016-06-20 ENCOUNTER — Encounter (HOSPITAL_COMMUNITY): Payer: Self-pay | Admitting: Emergency Medicine

## 2016-06-20 ENCOUNTER — Emergency Department (HOSPITAL_COMMUNITY): Payer: Medicare Other

## 2016-06-20 DIAGNOSIS — Z87891 Personal history of nicotine dependence: Secondary | ICD-10-CM | POA: Insufficient documentation

## 2016-06-20 DIAGNOSIS — Z8541 Personal history of malignant neoplasm of cervix uteri: Secondary | ICD-10-CM | POA: Insufficient documentation

## 2016-06-20 DIAGNOSIS — R05 Cough: Secondary | ICD-10-CM | POA: Diagnosis present

## 2016-06-20 DIAGNOSIS — E039 Hypothyroidism, unspecified: Secondary | ICD-10-CM | POA: Insufficient documentation

## 2016-06-20 DIAGNOSIS — Z85118 Personal history of other malignant neoplasm of bronchus and lung: Secondary | ICD-10-CM | POA: Diagnosis not present

## 2016-06-20 DIAGNOSIS — K1120 Sialoadenitis, unspecified: Secondary | ICD-10-CM | POA: Diagnosis not present

## 2016-06-20 DIAGNOSIS — Z79899 Other long term (current) drug therapy: Secondary | ICD-10-CM | POA: Insufficient documentation

## 2016-06-20 DIAGNOSIS — J449 Chronic obstructive pulmonary disease, unspecified: Secondary | ICD-10-CM | POA: Diagnosis not present

## 2016-06-20 LAB — BASIC METABOLIC PANEL
Anion gap: 11 (ref 5–15)
BUN: 26 mg/dL — AB (ref 6–20)
CALCIUM: 9.7 mg/dL (ref 8.9–10.3)
CHLORIDE: 98 mmol/L — AB (ref 101–111)
CO2: 25 mmol/L (ref 22–32)
CREATININE: 0.86 mg/dL (ref 0.44–1.00)
Glucose, Bld: 146 mg/dL — ABNORMAL HIGH (ref 65–99)
Potassium: 4.8 mmol/L (ref 3.5–5.1)
SODIUM: 134 mmol/L — AB (ref 135–145)

## 2016-06-20 MED ORDER — IOPAMIDOL (ISOVUE-300) INJECTION 61%
75.0000 mL | Freq: Once | INTRAVENOUS | Status: AC | PRN
  Administered 2016-06-20: 75 mL via INTRAVENOUS

## 2016-06-20 NOTE — Discharge Instructions (Signed)
Your vital signs within normal limits. Your CT scan suggests parotiditis. Please continue your doxycycline and your steroid medication. Please continue your Symbicort. Please get tired when drops, and suck on those 4 or 5 times daily. Please see Dr. Harrington Challenger in 5-7 days for recheck of your parotid gland. Return to the emergency department if you develop high fever, or excessive swelling.

## 2016-06-20 NOTE — ED Triage Notes (Signed)
Pt reports she was just discharged from hospital on new meds.  One of those included Symbicort.  States the past 2 days when she used it the side of her face hurt and her face turned red.  Pt denies shortness of breath or itching.  States her breathing is at baseline.

## 2016-06-20 NOTE — ED Provider Notes (Signed)
[facility] DEPT Provider Note   CSN: [id number] Arrival date & time: 06/20/16  1540     History   Chief Complaint Chief Complaint  Patient presents with  . Allergic Reaction    HPI Dawn Mercer is a 71 y.o. female.  Patient is a 71 year old female who presents to the emergency department with possible allergic reaction.  Patient states she was discharged from the hospital 2 days ago because of an exacerbation of chronic obstructive pulmonary disease. She was placed on doxycycline, steroids, and Symbicort. The patient states that she took the Symbicort on yesterday and she had flushing of her face for about 10 minutes. She did not have any recurrence of the facial flushing. She did not have any new shortness of breath or rash. Today approximately 1:30 PM the patient took another dose of the Symbicort. She noticed that there seemed to be some swelling around her left jaw accompanied by pain when she was chewing and eating a soft pizza. The patient states she did not have any rash, no unusual sweats, no new breathing changes, no swelling of the extremities, no other facial swelling, no difficulty with swallowing, and no hives reported. The patient is concerned that she may have allergic reaction to the Symbicort. She presents now for evaluation of these symptoms.  Patient has a history of bronchitis, claustrophobia, chronic obstructive pulmonary disease, cervical cancer, and      Past Medical History:  Diagnosis Date  . Anxiety   . Bronchitis    Hx: of  . 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 Chest congestion 04/11/2016  . Claustrophobia   . COPD (chronic obstructive pulmonary disease) ([place])   . GERD (gastroesophageal reflux disease)   . H/O hiatal hernia   . Hay fever    Hx: of  . History of radiation therapy 01/28/13-03/23/13   63 gray to right upper lobe  . Hypothyroidism   . IBS (irritable bowel syndrome)    Hx: of  . Lung cancer,  upper lobe ([place]) dx'd 12/2012   chemo and XRt complete  . 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 Pneumonia    years ago  . Seizures ([place])    Hx: of over 40 years ago  . Shortness of breath    Hx: of with exertion  . Status post radiation therapy 10/1999  . 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 Gastroesophageal reflux disease   . Acute on chronic respiratory failure with hypoxia ([place]) 06/16/2016  . COPD with acute exacerbation ([place]) 06/16/2016  . COPD exacerbation ([place]) 06/16/2016  . Chest pain 06/16/2016  . Hypothyroidism 06/16/2016  . Compression fracture of body of thoracic vertebra ([place]) 12/06/2015  . Compression fracture of thoracic vertebra ([facility]) 11/20/2015  . Back pain, thoracic 11/20/2015  . COPD mixed type ([place]) 05/31/2015  . Chronic hypoxemic respiratory failure ([place]) 05/31/2015  . Dyspnea 05/31/2015  . Symptomatic cholelithiasis 07/23/2013  . History of cervical cancer 04/26/2013  . Malignant neoplasm of upper lobe of lung ([place]) 01/14/2013    Past Surgical History:  Procedure Laterality Date  . CHOLECYSTECTOMY N/A 08/03/2013   Procedure: LAPAROSCOPIC CHOLECYSTECTOMY;  Surgeon: Harl Bowie, MD;  Location: [facility];  Service: General;  Laterality: N/A;  . COLONOSCOPY W/ BIOPSIES AND POLYPECTOMY     Hx: of  . IR GENERIC HISTORICAL  04/11/2016   IR  REMOVAL TUN ACCESS W/ PORT W/O FL MOD SED 04/11/2016 Arne Cleveland, MD [facility]-INTERV RAD  . KYPHOPLASTY N/A 12/06/2015   Procedure: THORACIC 5 KYPHOPLASTY;  Surgeon: Melina Schools, MD;  Location: [facility];  Service: Orthopedics;  Laterality: N/A;  . LASER ABLATION  04/2004   for VIN III  . PORTACATH PLACEMENT    . TUBAL LIGATION    . VIDEO BRONCHOSCOPY WITH ENDOBRONCHIAL NAVIGATION N/A 12/31/2012   Procedure: VIDEO BRONCHOSCOPY WITH ENDOBRONCHIAL NAVIGATION;  Surgeon: Melrose Nakayama, MD;  Location: [facility];  Service: Thoracic;  Laterality: N/A;   . VIDEO BRONCHOSCOPY WITH ENDOBRONCHIAL ULTRASOUND N/A 12/31/2012   Procedure: VIDEO BRONCHOSCOPY WITH ENDOBRONCHIAL ULTRASOUND;  Surgeon: Melrose Nakayama, MD;  Location: [facility];  Service: Thoracic;  Laterality: N/A;    OB History    No data available       Home Medications    Prior to Admission medications   Medication Sig Start Date End Date Taking? Authorizing Provider  albuterol (PROVENTIL HFA;VENTOLIN HFA) 108 (90 BASE) MCG/ACT inhaler Inhale 1 puff into the lungs every 6 (six) hours as needed for wheezing.    Historical Provider, MD  ALPRAZolam [name]) 0.5 MG tablet Take 0.5 tablets (0.25 mg total) by mouth 2 (two) times daily as needed for anxiety. Patient taking differently: Take 0.25 mg by mouth 2 (two) times daily as needed for anxiety. Called in to [facility] per Dr. Sondra Come on 01/11/16. 01/11/16   Gery Pray, MD  budesonide-formoterol [facility]) 160-4.5 MCG/ACT inhaler Inhale 2 puffs into the lungs 2 times daily at 12 noon and 4 pm. 06/17/16   Barton Dubois, MD  chlorpheniramine-HYDROcodone (TUSSIONEX) 10-8 MG/5ML SUER Take 5 mLs by mouth at bedtime.     Historical Provider, MD  Cholecalciferol (VITAMIN D3) 400 units CAPS Take 1 tablet by mouth 2 (two) times daily.    Historical Provider, MD  dexlansoprazole (DEXILANT) 60 MG capsule Take 60 mg by mouth daily as needed (for acid reflux.).    Historical Provider, MD  doxycycline (VIBRA-TABS) 100 MG tablet Take 1 tablet (100 mg total) by mouth every 12 (twelve) hours. 06/17/16   Barton Dubois, MD  gabapentin (NEURONTIN) 100 MG capsule Take 100 mg by mouth daily.    Historical Provider, MD  HYDROcodone-acetaminophen (NORCO) 5-325 MG tablet Take 1 tablet by mouth every 6 (six) hours as needed for severe pain. Patient not taking: Reported on 06/15/2016 01/11/16   Gery Pray, MD  ipratropium-albuterol (DUONEB) 0.5-2.5 (3) MG/3ML SOLN Take 3 mLs by nebulization every 4 (four) hours as needed. 06/17/16   Barton Dubois, MD    levothyroxine (SYNTHROID, LEVOTHROID) 125 MCG tablet Take 125 mcg by mouth daily before breakfast.    Historical Provider, MD  OXYGEN Inhale into the lungs. 2 lpm with exertion    Historical Provider, MD  predniSONE (DELTASONE) 20 MG tablet Take 3 tablets by mouth daily X1 day; then 2 tablets by mouth daily X 2 days; then 1 tablet by mouth daily X 2 days; then 1/2 tablet by mouth daily X 3 days and stop prednisone. 06/17/16   Barton Dubois, MD  SPIRIVA HANDIHALER 18 MCG inhalation capsule  02/27/16   Historical Provider, MD    Family History Family History  Problem Relation Age of Onset  . Lung cancer Brother   . Heart disease Father   . Cervical cancer Sister   . Heart disease Brother   . Cancer Brother     small cell lung cancer    Social History Social History  Substance Use Topics  . Smoking status: Former Smoker    Packs/day: 1.00    Years: 15.00    Quit date: 02/25/1998  . Smokeless tobacco: Never Used  . Alcohol use No     Allergies   Latex and Codeine   Review of Systems Review of Systems  HENT: Positive for congestion.        Jaw pain  Respiratory: Positive for cough and wheezing.   All other systems reviewed and are negative.    Physical Exam Updated Vital Signs BP 128/69 (BP Location: Right Arm)   Pulse 80   Temp 98.2 F (36.8 C) (Oral)   Resp 18   Ht '5\' 6"'$  (1.676 m)   Wt 83 kg   SpO2 97%   BMI 29.54 kg/m   Physical Exam  Constitutional: She is oriented to person, place, and time. She appears well-developed and well-nourished.  Non-toxic appearance.  HENT:  Head: Normocephalic.  Right Ear: Tympanic membrane and external ear normal.  Left Ear: Tympanic membrane and external ear normal.  There is mild crepitus of the left TMJ. The area is not hot. Is no evidence of dislocation. There is no deformity of the mandible. There is some submandibular fullness palpated. This area is tender to palpation.  The patient has upper and lower denture plates.  There no lesions or mass or unusual swelling noted of the lower gum area. There is no swelling under the tongue. There is no swelling of the tongue.  Eyes: EOM and lids are normal. Pupils are equal, round, and reactive to light.  Neck: Normal range of motion. Neck supple. Carotid bruit is not present.  Cardiovascular: Normal rate, regular rhythm, normal heart sounds, intact distal pulses and normal pulses.   Pulmonary/Chest: No respiratory distress. She has wheezes.  The patient speaks in complete sentences. There is symmetrical rise and fall of the chest. There are decreased breath sounds at the bases. There bilateral wheezes and rhonchi present.  Abdominal: Soft. Bowel sounds are normal. There is no tenderness. There is no guarding.  Musculoskeletal: Normal range of motion. She exhibits no edema.  Lymphadenopathy:       Head (right side): No submandibular adenopathy present.       Head (left side): No submandibular adenopathy present.    She has no cervical adenopathy.  Neurological: She is alert and oriented to person, place, and time. She has normal strength. No cranial nerve deficit or sensory deficit.  Skin: Skin is warm and dry.  Psychiatric: She has a normal mood and affect. Her speech is normal.  Nursing note and vitals reviewed.    ED Treatments / Results  Labs (all labs ordered are listed, but only abnormal results are displayed) Labs Reviewed  BASIC METABOLIC PANEL - Abnormal; Notable for the following:       Result Value   Sodium 134 (*)    Chloride 98 (*)    Glucose, Bld 146 (*)    BUN 26 (*)    All other components within normal limits    EKG  EKG Interpretation None       Radiology No results found.  Procedures Procedures (including critical care time)  Medications Ordered in ED Medications - No data to display   Initial Impression / Assessment and Plan / ED Course Case discussed with Dr Oleta Mouse.  I have reviewed the triage vital signs and the nursing  notes.  Pertinent labs & imaging results that were available during my care of the  patient were reviewed by me and considered in my medical decision making (see chart for details).       Final Clinical Impressions(s) / ED Diagnoses MDM Vital signs within normal limits. Pulse oximetry is 93% on oxygen. Patient uses home oxygen because of her chronic obstructive pulmonary disease.  The patient had a CT scan of the maxillofacial bones with contrast. The CT is consistent with parotitis. There is some chronic maxillary and sphenoid sinus issues, but no other acute problems noted.  The patient is currently on doxycycline and steroid medication. The patient has Norco to use for pain not improved by Tylenol extra strength. I've advised the patient to get to heart limb and drops to use. The patient is to follow-up with the primary physician in 5-7 days concerning the parotitis. The patient will return to the emergency department if any high fever, unusual swelling, or changes in general condition.    Final diagnoses:  Parotitis    New Prescriptions New Prescriptions   No medications on file     Lily Kocher, PA-C 06/20/16 Oliver Liu, MD 06/21/16 [phone]

## 2016-06-20 NOTE — ED Notes (Signed)
ED Provider at bedside. 

## 2016-07-02 ENCOUNTER — Inpatient Hospital Stay: Payer: Medicare Other | Admitting: Adult Health

## 2016-07-04 ENCOUNTER — Inpatient Hospital Stay: Payer: Medicare Other | Admitting: Acute Care

## 2016-07-08 ENCOUNTER — Ambulatory Visit (INDEPENDENT_AMBULATORY_CARE_PROVIDER_SITE_OTHER): Payer: Medicare Other | Admitting: Acute Care

## 2016-07-08 ENCOUNTER — Encounter: Payer: Self-pay | Admitting: Acute Care

## 2016-07-08 VITALS — BP 118/70 | HR 105 | Ht 66.0 in | Wt 184.0 lb

## 2016-07-08 DIAGNOSIS — J449 Chronic obstructive pulmonary disease, unspecified: Secondary | ICD-10-CM | POA: Diagnosis not present

## 2016-07-08 DIAGNOSIS — J9611 Chronic respiratory failure with hypoxia: Secondary | ICD-10-CM

## 2016-07-08 DIAGNOSIS — J441 Chronic obstructive pulmonary disease with (acute) exacerbation: Secondary | ICD-10-CM

## 2016-07-08 MED ORDER — PREDNISONE 10 MG PO TABS: ORAL_TABLET | ORAL | 0 refills | Status: DC

## 2016-07-08 NOTE — Progress Notes (Signed)
Chart and office note reviewed in detail along with available xrays/ labs > agree with a/p as outlined  

## 2016-07-08 NOTE — Patient Instructions (Addendum)
It is good to meet you today. We will schedule you for follow up appointment with Dr.Wert with PFT's prior. Prednisone taper; 10 mg tablets: 4 tabs x 2 days, 3 tabs x 2 days, 2 tabs x 2 days 1 tab x 2 days then stop. Start Zyrtec daily Follow up with Dr. Melvyn Novas in 2-3 weeks with PFT's prior. Therapeutic trial of Trelegy. Use once daily. Rinse mouth after use. Hold off on Symbicort and Spiriva.  Continue wearing oxygen at 2 L [place]  Call if secretion get worse so we can call in prescription. Follow up with Dr. Earlie Server regarding necessity for sooner PET scan as suggested by radiology after CT Angio on 06/15/2016. Please contact office for sooner follow up if symptoms do not improve or worsen or seek emergency care.

## 2016-07-08 NOTE — Assessment & Plan Note (Addendum)
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 Plan: We will schedule you for follow up appointment with Dr.Wert with PFT's prior. Prednisone taper; 10 mg tablets: 4 tabs x 2 days, 3 tabs x 2 days, 2 tabs x 2 days 1 tab x 2 days then stop. Start Zyrtec daily Follow up with Dr. Melvyn Novas in 2-3 weeks with PFT's prior. Therapeutic trial of Trelegy. Use once daily. Rinse mouth after use. Hold off on Symbicort and Spiriva while using Trelegy.  Continue wearing oxygen at 2 L [place]  Call if secretion get worse so we can call in prescription. Follow up with Dr. Earlie Server regarding necessity for sooner PET scan as suggested by radiology after CT Angio on 06/15/2016. Please contact office for sooner follow up if symptoms do not improve or worsen or seek emergency care.

## 2016-07-08 NOTE — Assessment & Plan Note (Signed)
Continue wearing oxygen as you have been doing. Saturation goal is 88-92%

## 2016-07-08 NOTE — Progress Notes (Signed)
History of Present Illness Dawn Mercer is a 71 y.o. female former smoker with COPD, lung cancer ( 2014 reated with chemo radiation) and Bronchiectasis, for follow up of recent hospitalization for COPD exacerbation. She was previously  followed by Dr. Lenna Gilford, but has recently switched to Dr. Melvyn Novas.   07/08/2016 Hospital Follow Up For COPD exacerbation: Pt. Presents for hospital Follow Up: Pt. Was admitted for Acute on chronic respiratory failure from 4/21-4/23. She was positive for meta pneumovirus, which was thought to be the trigger of her exacerbation.Marland Kitchen She was treated with oxygen, IV steroids,scheduled BD;s and antibiotics.She was discharged home on Doxycycline and a prednisone taper which she has  Completed.She is maintained on Symbicort, Spiriva Respimat and Pro Air for rescue. She is using her DuoNebs  3-4 times daily. She states her breathing is no better. She continues to be dyspneic with oxygen saturation drops with minimal exertion. CTA in hospital was negative for PE. Pt.  states she does have some light green secretions in a small amount. She denies fever, chest pain, orthopnea or hemoptysis.She has not had PFT's since 2015. Ct Angio did show  right hilar soft tissue density that in cases right upper lobe pulmonary artery and bronchi. This appears slightly increased in size since previous imaging . Subcarinal adenopathy also appears slightly increased compared to prior CT; suggest correlation with PET-CT to evaluate for recurrent/metastatic disease. Pt. Is followed by Dr. Mohammed/ oncology.I have asked her to follow up with him regarding sooner PET imaging regarding the above finding.She is concerned about the worsening dyspnea with exertion.  Test Results: 06/15/2016 Negative for acute pulmonary embolus or aortic dissection  Truncated and attenuated appearance of right upper lobe pulmonary artery branches. Again demonstrated is a right hilar soft tissue density that in cases right  upper lobe pulmonary artery and bronchi. This appears slightly increased in size. Subcarinal adenopathy also appears slightly increased compared to prior CT; suggest correlation with PET-CT to evaluate for recurrent/metastatic disease. Moderate severe emphysema. Stable circumferential wall thickening of the midesophagus  CXR 06/15/2016: Hyperinflation without acute infiltrate Grossly unchanged pleural and parenchymal scarring/fibrosis in the right upper lobe. Stable cardiomegaly.  CBC Latest Ref Rng & Units 06/16/2016 06/15/2016 04/11/2016  WBC 4.0 - 10.5 K/uL 7.7 9.1 7.2  Hemoglobin 12.0 - 15.0 g/dL 11.0(L) 12.6 12.4  Hematocrit 36.0 - 46.0 % 33.5(L) 37.5 37.5  Platelets 150 - 400 K/uL 156 158 225    BMP Latest Ref Rng & Units 06/20/2016 06/16/2016 06/15/2016  Glucose 65 - 99 mg/dL 146(H) 349(H) 105(H)  BUN 6 - 20 mg/dL 26(H) 15 12  Creatinine 0.44 - 1.00 mg/dL 0.86 1.17(H) 0.72  Sodium 135 - 145 mmol/L 134(L) 131(L) 137  Potassium 3.5 - 5.1 mmol/L 4.8 2.9(L) 3.9  Chloride 101 - 111 mmol/L 98(L) 95(L) 99(L)  CO2 22 - 32 mmol/L '25 22 28  '$ Calcium 8.9 - 10.3 mg/dL 9.7 8.5(L) 8.9     PFT    Component Value Date/Time   FEV1PRE 1.03 08/19/2013 1404   FEV1POST 1.07 08/19/2013 1404   FVCPRE 1.64 08/19/2013 1404   FVCPOST 1.60 08/19/2013 1404   TLC 4.39 08/19/2013 1404   DLCOUNC 7.25 08/19/2013 1404   PREFEV1FVCRT 63 08/19/2013 1404   PSTFEV1FVCRT 67 08/19/2013 1404    Dg Chest 2 View  Result Date: 06/15/2016 CLINICAL DATA:  Shortness of breath history of lung cancer EXAM: CHEST  2 VIEW COMPARISON:  CT 03/27/2016, chest x-ray 12/06/2015 FINDINGS: Hyperinflation with emphysematous changes again noted. Biapical  right greater than left pleural thickening. Scarring an fibrosis in the right upper lobe, grossly unchanged. Removal of previously noted right-sided central venous catheter. No definite acute infiltrate or effusion. Mild cardiomegaly. Vertebral augmentation changes at C6.  Superior endplate compression of T7 grossly stable compared with prior CT. IMPRESSION: 1. Hyperinflation without acute infiltrate 2. Grossly unchanged pleural and parenchymal scarring/fibrosis in the right upper lobe. 3. Stable cardiomegaly. Electronically Signed   By: Donavan Foil M.D.   On: 06/15/2016 21:50   Ct Angio Chest Pe W Or Wo Contrast  Result Date: 06/15/2016 CLINICAL DATA:  Shortness of breath cough history of cervical cancer EXAM: CT ANGIOGRAPHY CHEST WITH CONTRAST TECHNIQUE: Multidetector CT imaging of the chest was performed using the standard protocol during bolus administration of intravenous contrast. Multiplanar CT image reconstructions and MIPs were obtained to evaluate the vascular anatomy. CONTRAST:  85 mL Isovue 370 intravenous COMPARISON:  Chest x-ray 06/15/2016, CT chest 03/27/2016, 12/29/2015, 08/17/2015, PET-CT 09/06/2015 FINDINGS: Cardiovascular: Satisfactory opacification of the pulmonary arteries to the segmental level. No evidence of pulmonary embolism centrally. Truncated and attenuated appearance of right upper lobe pulmonary arterial branches. Non aneurysmal aorta. No dissection. Aortic atherosclerosis. Proximal and origin of the left common carotid artery appears narrowed. Mild stenosis suspected of the origin of the right brachiocephalic artery. Coronary artery calcification. No pericardial effusion. Mediastinum/Nodes: Trachea deviates slightly to the right as before. Mild air distention of the esophagus. Circumferential wall thickening of the midesophagus at the level of the carina is similar compared to previous exam. Increased sub- carinal lymph node, measuring 16 mm on axial views compared with 11 mm previously. Lungs/Pleura: Moderate emphysematous disease as before. Right upper pleural thickening, similar compared to previous CT. Nodular areas of scarring again noted. Soft tissue density is again evident at the right hilus, it encases right upper lobe pulmonary artery  branches as well is bronchi. Soft tissue density at the right hilar area appears slightly more prominent compared to prior, measuring 3.5 cm maximally on axial views compared to 3.2 cm previously. Upper Abdomen: No acute abnormality. Musculoskeletal: Vertebral augmentation at T5. Superior endplate compression at T6 as before. Review of the MIP images confirms the above findings. IMPRESSION: 1. Negative for acute pulmonary embolus or aortic dissection 2. Truncated and attenuated appearance of right upper lobe pulmonary artery branches. Again demonstrated is a right hilar soft tissue density that in cases right upper lobe pulmonary artery and bronchi. This appears slightly increased in size. Subcarinal adenopathy also appears slightly increased compared to prior CT; suggest correlation with PET-CT to evaluate for recurrent/metastatic disease. 3. Moderate severe emphysema. 4. Stable circumferential wall thickening of the midesophagus Electronically Signed   By: Donavan Foil M.D.   On: 06/15/2016 23:52   Ct Maxillofacial W Contrast  Result Date: 06/20/2016 CLINICAL DATA:  Initial evaluation for acute left facial pain for 2 days. EXAM: CT MAXILLOFACIAL WITH CONTRAST TECHNIQUE: Multidetector CT imaging of the maxillofacial structures was performed. Multiplanar CT image reconstructions were also generated. A small metallic BB was placed on the right temple in order to reliably differentiate right from left. COMPARISON:  None. FINDINGS: Osseous: No acute osseous abnormality identified within the face. Leftward bowing of the nasal septum noted. Degenerative changes noted at the left TMJ. Patient is edentulous. Moderate degenerative spondylolysis noted at C3-4. Orbits: Globes and orbital soft tissues within normal limits. Sinuses: Small layering fluid levels within the maxillary sinuses. Scattered mucosal thickening throughout the ethmoidal air cells. Small amount of layering fluid within the  left sphenoid sinus is well.  Trace left mastoid effusion. Middle ear cavities are clear. Soft tissues: Left parotid gland is asymmetrically enlarged and hyperdense as compared to the right, suggestive of acute parotitis. No obstructive stone identified. No superimposed loculated collection. Associated mild hazy inflammatory stranding within the left parapharyngeal fat. Remaining salivary glands including the right parotid gland and submandibular glands are normal. No other acute soft tissue abnormality within the face. Limited intracranial: Unremarkable. IMPRESSION: 1. Findings consistent with acute left parotitis. No obstructive stone or other complication identified. 2. Small layering fluid levels within the maxillary and left sphenoid sinuses, with scattered mucosal thickening within the ethmoidal air cells, suggestive of acute sinusitis. Electronically Signed   By: Jeannine Boga M.D.   On: 06/20/2016 18:46     Past medical hx Past Medical History:  Diagnosis Date  . Anxiety   . Bronchitis    Hx: of  . 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 Chest congestion 04/11/2016  . Claustrophobia   . COPD (chronic obstructive pulmonary disease) ([place])   . GERD (gastroesophageal reflux disease)   . H/O hiatal hernia   . Hay fever    Hx: of  . History of radiation therapy 01/28/13-03/23/13   63 gray to right upper lobe  . Hypothyroidism   . IBS (irritable bowel syndrome)    Hx: of  . Lung cancer, upper lobe ([place]) dx'd 12/2012   chemo and XRt complete  . 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 Pneumonia    years ago  . Seizures ([place])    Hx: of over 40 years ago  . Shortness of breath    Hx: of with exertion  . Status post radiation therapy 10/1999  . 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 Smoking status: Former Smoker    Packs/day: 1.00    Years: 15.00    Quit date: 02/25/1998  . Smokeless tobacco: Never Used  .  Alcohol use No    Tobacco Cessation: Former smoker, quit 2000  Past surgical hx, Family hx, Social hx all reviewed.  Current Outpatient Prescriptions on File Prior to Visit  Medication Sig  . albuterol (PROVENTIL HFA;VENTOLIN HFA) 108 (90 BASE) MCG/ACT inhaler Inhale 1 puff into the lungs every 6 (six) hours as needed for wheezing.  Marland Kitchen ALPRAZolam (XANAX) 0.5 MG tablet Take 0.5 tablets (0.25 mg total) by mouth 2 (two) times daily as needed for anxiety. (Patient taking differently: Take 0.25 mg by mouth 2 (two) times daily as needed for anxiety. Called in to [facility] per Dr. Sondra Come on 01/11/16.)  . budesonide-formoterol (SYMBICORT) 160-4.5 MCG/ACT inhaler Inhale 2 puffs into the lungs 2 times daily at 12 noon and 4 pm.  . Cholecalciferol (VITAMIN D3) 400 units CAPS Take 1 tablet by mouth 2 (two) times daily.  Marland Kitchen dexlansoprazole (DEXILANT) 60 MG capsule Take 60 mg by mouth daily as needed (for acid reflux.).  Marland Kitchen gabapentin (NEURONTIN) 100 MG capsule Take 100 mg by mouth daily.  Marland Kitchen HYDROcodone-acetaminophen (NORCO) 5-325 MG tablet Take 1 tablet by mouth every 6 (six) hours as needed for severe pain.  Marland Kitchen ipratropium-albuterol (DUONEB) 0.5-2.5 (3) MG/3ML SOLN Take 3 mLs by nebulization every 4 (four) hours as needed.  Marland Kitchen levothyroxine (SYNTHROID, LEVOTHROID) 125 MCG tablet Take 125 mcg by mouth daily before breakfast.  . OXYGEN Inhale into the lungs. 2 lpm with exertion  .  SPIRIVA HANDIHALER 18 MCG inhalation capsule   . chlorpheniramine-HYDROcodone (TUSSIONEX) 10-8 MG/5ML SUER Take 5 mLs by mouth at bedtime.   Marland Kitchen doxycycline (VIBRA-TABS) 100 MG tablet Take 1 tablet (100 mg total) by mouth every 12 (twelve) hours. (Patient not taking: Reported on 07/08/2016)   No current facility-administered medications on file prior to visit.      Allergies  Allergen Reactions  . Latex Dermatitis  . Codeine Nausea Only    Review Of Systems:  Constitutional:   No  weight loss, night sweats,  Fevers, chills,  fatigue, or  lassitude.  HEENT:   No headaches,  Difficulty swallowing,  Tooth/dental problems, or  Sore throat,                No sneezing, itching, ear ache, nasal congestion, post nasal drip,   CV:  No chest pain,  Orthopnea, PND, swelling in lower extremities, anasarca, dizziness, palpitations, syncope.   GI  No heartburn, indigestion, abdominal pain, nausea, vomiting, diarrhea, change in bowel habits, loss of appetite, bloody stools.   Resp: + shortness of breath with exertion or at rest.  No excess mucus, scant  productive cough,  No non-productive cough,  No coughing up of blood.  Slight  change in color of mucus.  + wheezing.  No chest wall deformity  Skin: no rash or lesions.  GU: no dysuria, change in color of urine, no urgency or frequency.  No flank pain, no hematuria   MS:  No joint pain or swelling.  No decreased range of motion.  No back pain.  Psych:  No change in mood or affect. No depression or anxiety.  No memory loss.   Vital Signs BP 118/70 (BP Location: Left Arm, Cuff Size: Normal)   Pulse (!) 105   Ht '5\' 6"'$  (1.676 m)   Wt 184 lb (83.5 kg)   SpO2 92%   BMI 29.70 kg/m    Physical Exam:  General- No distress,  A&Ox3, pleasant  ENT: No sinus tenderness, TM clear, pale nasal mucosa, no oral exudate,no post nasal drip, no LAN Cardiac: S1, S2, regular rate and rhythm, no murmur Chest: + wheeze/no  rales/ dullness; no accessory muscle use, no nasal flaring, no sternal retractions Abd.: Soft Non-tender Ext: No clubbing cyanosis, edema Neuro:  normal strength, deconditioned at baseline Skin: No rashes, warm and dry Psych: normal mood and behavior   Assessment/Plan  COPD with acute exacerbation (HCC) Exacerbation thought to be triggered by meta pneumovirus Worsening dyspnea on exertion even after change of medication, and discontinuation of fosfamax/ rx IV reclast once yearly. Plan: We will schedule you for follow up appointment with Dr.Wert with PFT's  prior. Prednisone taper; 10 mg tablets: 4 tabs x 2 days, 3 tabs x 2 days, 2 tabs x 2 days 1 tab x 2 days then stop. Start Zyrtec daily Follow up with Dr. Melvyn Novas in 2-3 weeks with PFT's prior. Therapeutic trial of Trelegy. Use once daily. Rinse mouth after use. Hold off on Symbicort and Spiriva while using Trelegy.  Continue wearing oxygen at 2 L [place]  Call if secretion get worse so we can call in prescription. Follow up with Dr. Earlie Server regarding necessity for sooner PET scan as suggested by radiology after CT Angio on 06/15/2016. Please contact office for sooner follow up if symptoms do not improve or worsen or seek emergency care.     Chronic hypoxemic respiratory failure (HCC) Continue wearing oxygen as you have been doing. Saturation goal is 88-92%  Magdalen Spatz, NP 07/08/2016  2:04 PM

## 2016-07-09 ENCOUNTER — Telehealth: Payer: Self-pay | Admitting: Medical Oncology

## 2016-07-09 NOTE — Telephone Encounter (Signed)
Pt states Dr Melvyn Novas wants her to get a PET scan and requests Dr Julien Nordmann order it.

## 2016-07-09 NOTE — Telephone Encounter (Signed)
OK to order a PET scan in 1-2 weeks and follow up visit one week after the scan

## 2016-07-10 ENCOUNTER — Telehealth: Payer: Self-pay | Admitting: Oncology

## 2016-07-10 ENCOUNTER — Telehealth: Payer: Self-pay | Admitting: Internal Medicine

## 2016-07-10 ENCOUNTER — Other Ambulatory Visit: Payer: Self-pay | Admitting: Medical Oncology

## 2016-07-10 DIAGNOSIS — C3411 Malignant neoplasm of upper lobe, right bronchus or lung: Secondary | ICD-10-CM

## 2016-07-10 NOTE — Telephone Encounter (Signed)
Gave patient AVS and calender per 5/16 los.

## 2016-07-10 NOTE — Telephone Encounter (Signed)
Scheduled appt per sch message from Rn Diane - patient aware of appt date and time.  

## 2016-07-24 ENCOUNTER — Encounter (HOSPITAL_COMMUNITY)
Admission: RE | Admit: 2016-07-24 | Discharge: 2016-07-24 | Disposition: A | Discharge status: Home / Self Care | Payer: Medicare Other | Source: Ambulatory Visit | Attending: Internal Medicine | Admitting: Internal Medicine

## 2016-07-24 DIAGNOSIS — C3411 Malignant neoplasm of upper lobe, right bronchus or lung: Secondary | ICD-10-CM | POA: Diagnosis not present

## 2016-07-24 LAB — GLUCOSE, CAPILLARY: GLUCOSE-CAPILLARY: 100 mg/dL — AB (ref 65–99)

## 2016-07-24 MED ORDER — FLUDEOXYGLUCOSE F - 18 (FDG) INJECTION
9.2200 | Freq: Once | INTRAVENOUS | Status: AC
  Administered 2016-07-24: 9.22 via INTRAVENOUS

## 2016-08-01 ENCOUNTER — Ambulatory Visit (HOSPITAL_BASED_OUTPATIENT_CLINIC_OR_DEPARTMENT_OTHER): Payer: Medicare Other | Admitting: Internal Medicine

## 2016-08-01 ENCOUNTER — Telehealth: Payer: Self-pay | Admitting: Internal Medicine

## 2016-08-01 ENCOUNTER — Encounter: Payer: Self-pay | Admitting: Internal Medicine

## 2016-08-01 VITALS — BP 124/64 | HR 106 | Temp 97.7°F | Resp 19 | Ht 66.0 in | Wt 188.5 lb

## 2016-08-01 DIAGNOSIS — Z85118 Personal history of other malignant neoplasm of bronchus and lung: Secondary | ICD-10-CM | POA: Diagnosis not present

## 2016-08-01 DIAGNOSIS — J449 Chronic obstructive pulmonary disease, unspecified: Secondary | ICD-10-CM | POA: Diagnosis not present

## 2016-08-01 DIAGNOSIS — C3412 Malignant neoplasm of upper lobe, left bronchus or lung: Secondary | ICD-10-CM

## 2016-08-01 NOTE — Progress Notes (Signed)
[facility] [phone]   [phone]  OFFICE PROGRESS NOTE  Lawerance Cruel, MD [place] [address]  DIAGNOSIS: Stage IIIA (T1b., N2, M0) non-small cell lung cancer, squamous cell carcinoma presented with right upper lobe lung nodule in addition to mediastinal lymphadenopathy diagnosed in November of 2014.  PRIOR THERAPY:  1) Concurrent chemoradiation with weekly carboplatin for AUC of 2 and paclitaxel 45 mg/M2, status post 8 cycles, last dose was given 03/15/2013 with partial response. 2) Consolidation chemotherapy with carboplatin for AUC of 5 and paclitaxel 175 mg/M2 every 3 weeks with Neulasta support, status post 3 cycles. First dose 05/03/2013. Last dose was given 06/14/2013.  CURRENT THERAPY: Observation.  CHEMOTHERAPY INTENT: Control/curative  CURRENT # OF CHEMOTHERAPY CYCLES: 0  CURRENT ANTIEMETICS: Zofran, dexamethasone and Compazine  CURRENT SMOKING STATUS: Former smoker.  ORAL CHEMOTHERAPY AND CONSENT: None  CURRENT BISPHOSPHONATES USE: None  PAIN MANAGEMENT: 0/10  NARCOTICS INDUCED CONSTIPATION: None  LIVING WILL AND CODE STATUS: Full code   INTERVAL HISTORY: Dawn Mercer 71 y.o. female returns to the clinic today for follow-up visit accompanied by her husband. The patient is feeling fine today with no specific complaints except for the baseline shortness breath and she is currently on home oxygen. She was seen recently by Dr. Melvyn Novas for evaluation of her bleeding issues and he was concerned about disease recurrence. He was interested in repeating a PET scan for reevaluation of her disease which I did order and was performed recently. The patient is here today for evaluation and discussion of her scan results. She denied having any recent weight loss or night sweats. She has no nausea, vomiting, diarrhea or constipation. She denied having any chest pain but continues to have shortness breath and dry  cough with no hemoptysis.   MEDICAL HISTORY: Past Medical History:  Diagnosis Date  . Anxiety   . Bronchitis    Hx: of  . 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 Chest congestion 04/11/2016  . Claustrophobia   . COPD (chronic obstructive pulmonary disease) ([facility])   . GERD (gastroesophageal reflux disease)   . H/O hiatal hernia   . Hay fever    Hx: of  . History of radiation therapy 01/28/13-03/23/13   63 gray to right upper lobe  . Hypothyroidism   . IBS (irritable bowel syndrome)    Hx: of  . Lung cancer, upper lobe ([facility]) dx'd 12/2012   chemo and XRt complete  . 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 Pneumonia    years ago  . Seizures ([facility])    Hx: of over 40 years ago  . Shortness of breath    Hx: of with exertion  . Status post radiation therapy 10/1999  . 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  MEDICATIONS:  Current Outpatient Prescriptions  Medication Sig Dispense Refill  . albuterol (PROVENTIL HFA;VENTOLIN HFA) 108 (90 BASE) MCG/ACT inhaler Inhale 1 puff into the lungs every 6 (six) hours as needed for wheezing.    Marland Kitchen ALPRAZolam (XANAX) 0.5 MG tablet Take 0.5 tablets (0.25 mg total) by mouth 2 (two) times daily as needed for anxiety. (Patient taking differently: Take 0.25 mg by mouth 2 (two) times daily as needed for anxiety. Called in to [facility] per Dr. Sondra Come on 01/11/16.) 90 tablet 0  . budesonide-formoterol (SYMBICORT) 160-4.5 MCG/ACT  inhaler Inhale 2 puffs into the lungs 2 times daily at 12 noon and 4 pm. 1 Inhaler 3  . Cholecalciferol (VITAMIN D3) 400 units CAPS Take 1 tablet by mouth 2 (two) times daily.    Marland Kitchen dexlansoprazole (DEXILANT) 60 MG capsule Take 60 mg by mouth daily as needed (for acid reflux.).    Marland Kitchen gabapentin (NEURONTIN) 100 MG capsule Take 100 mg by mouth daily.    Marland Kitchen HYDROcodone-acetaminophen (NORCO) 5-325 MG tablet Take 1 tablet by mouth every 6 (six)  hours as needed for severe pain. 60 tablet 0  . ipratropium-albuterol (DUONEB) 0.5-2.5 (3) MG/3ML SOLN Take 3 mLs by nebulization every 4 (four) hours as needed. 270 mL 3  . levothyroxine (SYNTHROID, LEVOTHROID) 125 MCG tablet Take 125 mcg by mouth daily before breakfast.    . OXYGEN Inhale into the lungs. 2 lpm with exertion    . SPIRIVA HANDIHALER 18 MCG inhalation capsule      No current facility-administered medications for this visit.     SURGICAL HISTORY:  Past Surgical History:  Procedure Laterality Date  . CHOLECYSTECTOMY N/A 08/03/2013   Procedure: LAPAROSCOPIC CHOLECYSTECTOMY;  Surgeon: Harl Bowie, MD;  Location: [facility];  Service: General;  Laterality: N/A;  . COLONOSCOPY W/ BIOPSIES AND POLYPECTOMY     Hx: of  . IR GENERIC HISTORICAL  04/11/2016   IR REMOVAL TUN ACCESS W/ PORT W/O FL MOD SED 04/11/2016 Arne Cleveland, MD [facility]-INTERV RAD  . KYPHOPLASTY N/A 12/06/2015   Procedure: THORACIC 5 KYPHOPLASTY;  Surgeon: Melina Schools, MD;  Location: [facility];  Service: Orthopedics;  Laterality: N/A;  . LASER ABLATION  04/2004   for VIN III  . PORTACATH PLACEMENT    . TUBAL LIGATION    . VIDEO BRONCHOSCOPY WITH ENDOBRONCHIAL NAVIGATION N/A 12/31/2012   Procedure: VIDEO BRONCHOSCOPY WITH ENDOBRONCHIAL NAVIGATION;  Surgeon: Melrose Nakayama, MD;  Location: [facility];  Service: Thoracic;  Laterality: N/A;  . VIDEO BRONCHOSCOPY WITH ENDOBRONCHIAL ULTRASOUND N/A 12/31/2012   Procedure: VIDEO BRONCHOSCOPY WITH ENDOBRONCHIAL ULTRASOUND;  Surgeon: Melrose Nakayama, MD;  Location: [facility];  Service: Thoracic;  Laterality: N/A;    REVIEW OF SYSTEMS:  A comprehensive review of systems was negative except for: Respiratory: positive for cough and dyspnea on exertion   PHYSICAL EXAMINATION: General appearance: alert, cooperative, fatigued and no distress Head: Normocephalic, without obvious abnormality, atraumatic Neck: no adenopathy, no JVD, supple, symmetrical, trachea midline and thyroid not  enlarged, symmetric, no tenderness/mass/nodules Lymph nodes: Cervical, supraclavicular, and axillary nodes normal. Resp: clear to auscultation bilaterally Back: symmetric, no curvature. ROM normal. No CVA tenderness. Cardio: regular rate and rhythm, S1, S2 normal, no murmur, click, rub or gallop GI: soft, non-tender; bowel sounds normal; no masses,  no organomegaly Extremities: extremities normal, atraumatic, no cyanosis or edema  ECOG PERFORMANCE STATUS: 1 - Symptomatic but completely ambulatory  Blood pressure 124/64, pulse (!) 106, temperature 97.7 F (36.5 C), temperature source Oral, resp. rate 19, height 5\' 6"  (1.676 m), weight 188 lb 8 oz (85.5 kg), SpO2 98 %.  LABORATORY DATA: Lab Results  Component Value Date   WBC 7.7 06/16/2016   HGB 11.0 (L) 06/16/2016   HCT 33.5 (L) 06/16/2016   MCV 87.5 06/16/2016   PLT 156 06/16/2016      Chemistry      Component Value Date/Time   NA 134 (L) 06/20/2016 1648   NA 139 03/27/2016 0958   K 4.8 06/20/2016 1648   K 4.3 03/27/2016 0958   CL 98 (  L) 06/20/2016 1648   CO2 25 06/20/2016 1648   CO2 28 03/27/2016 0958   BUN 26 (H) 06/20/2016 1648   BUN 10.4 03/27/2016 0958   CREATININE 0.86 06/20/2016 1648   CREATININE 0.7 03/27/2016 0958      Component Value Date/Time   CALCIUM 9.7 06/20/2016 1648   CALCIUM 9.3 03/27/2016 0958   ALKPHOS 63 06/16/2016 0500   ALKPHOS 120 03/27/2016 0958   AST 37 06/16/2016 0500   AST 17 03/27/2016 0958   ALT 18 06/16/2016 0500   ALT 12 03/27/2016 0958   BILITOT 0.3 06/16/2016 0500   BILITOT 0.74 03/27/2016 0958       RADIOGRAPHIC STUDIES: Nm Pet Image Restag (ps) Skull Base To Thigh  Result Date: 07/24/2016 CLINICAL DATA:  Subsequent treatment strategy for right upper lobe lung cancer EXAM: NUCLEAR MEDICINE PET SKULL BASE TO THIGH TECHNIQUE: 9.2 mCi F-18 FDG was injected intravenously. Full-ring PET imaging was performed from the skull base to thigh after the radiotracer. CT data was obtained  and used for attenuation correction and anatomic localization. FASTING BLOOD GLUCOSE:  Value: 100 mg/dl COMPARISON:  Multiple exams, including 09/06/2015 PET-CT FINDINGS: NECK Accentuated activity along the anterior tongue, maximum SUV 11.4, new compared to the prior exam, less likely to be physiologic but warranting inspection of the anterior tongue. That said, I do not see a mass of the anterior tongue on the 06/20/2016 facial CT. Chronic left sphenoid sinusitis. The maxillary and ethmoid sinusitis shown on recent CT scan appears to have cleared. CHEST Right suprahilar opacity likely primarily from radiation pneumonitis, maximum SUV in this region 3.8, formerly 4.7. There is some increase in the pleural thickening and adjacent airspace opacity in marginally in the right upper lobe compared to prior, much of this may be due to radiation therapy, maximum SUV in this region 3.9, formerly 4.2. Accentuated activity along the right pectoralis musculature, likely physiologic. Physiologic activity in the thoracic esophagus. Coronary, aortic arch, and branch vessel atherosclerotic vascular disease. Emphysema. Volume loss in the right hemithorax. Subcarinal lymph node observed without hypermetabolic activity. The pleural-based focus of hypermetabolic activity shown on the prior PET-CT is no longer present or hypermetabolic. ABDOMEN/PELVIS 1.4 by 1.1 cm left adrenal adenoma without significant metabolic activity. Perineal activity without CT correlate attributable to mild urinary incontinence. Prior cholecystectomy. No worrisome hypermetabolic activity in the abdomen/pelvis. SKELETON The previous activity in the vicinity of the T5 vertebral body has essentially resolved. Prior vertebral augmentation in this vicinity. No compelling findings of osseous metastatic disease at this time. IMPRESSION: 1. Continued low-grade activity in the right suprahilar airspace opacity likely from radiation pneumonitis. There is some increased  extension of this opacity along to the pleural surface compared to prior PET-CT (but stable from recent chest CT), but no focal or specific abnormal hypermetabolic activity is identified to indicate recurrence. 2. Improved sinusitis compared to recent facial CT. 3. There is activity along the anterior tongue. This is almost certainly physiologic given the lack of a visible mass on the recent facial CT, but may warrant visual inspection of the anterior tongue. 4. Previous activity in the T5 vertebral body has resolved. Prior vertebral augmentation. 5. Other imaging findings of potential clinical significance: Minimal chronic residual left sphenoid sinusitis. Aortic Atherosclerosis (ICD10-I70.0) and Emphysema (ICD10-J43.9). Coronary atherosclerosis. Small left adrenal adenoma. Electronically Signed   By: Van Clines M.D.   On: 07/24/2016 13:59   ASSESSMENT AND PLAN:  This is a very pleasant 71 years old white female with stage  IIIa non-small cell lung cancer status post a course of concurrent chemoradiation followed by consolidation chemotherapy with carboplatin and paclitaxel and she has been on observation for more than 3 years with no clear evidence for disease recurrence. She had a recent PET scan performed. I personally and independently reviewed the scan images and discuss the results with the patient and her husband. Her scan showed no clear evidence for disease recurrence. I recommended for the patient to continue on observation with repeat CT scan of the chest in 6 months. For COPD, she will continue her care under Dr. Melvyn Novas. She was advised to call immediately if she has any concerning symptoms in the interval. The patient voices understanding of current disease status and treatment options and is in agreement with the current care plan. All questions were answered. The patient knows to call the clinic with any problems, questions or concerns. We can certainly see the patient much sooner if  necessary. I spent 10 minutes counseling the patient face to face. The total time spent in the appointment was 15 minutes.  Disclaimer: This note was dictated with voice recognition software. Similar sounding words can inadvertently be transcribed and may not be corrected upon review.

## 2016-08-01 NOTE — Telephone Encounter (Signed)
Scheduled appt per 6/7 los - Gave patient AVS and calender per 6/7 - lab and f/u in 6 months.

## 2016-08-15 ENCOUNTER — Institutional Professional Consult (permissible substitution): Payer: Medicare Other | Admitting: Pulmonary Disease

## 2016-08-25 ENCOUNTER — Encounter (HOSPITAL_COMMUNITY): Payer: Self-pay | Admitting: Emergency Medicine

## 2016-08-25 ENCOUNTER — Emergency Department (HOSPITAL_COMMUNITY)
Admission: EM | Admit: 2016-08-25 | Discharge: 2016-08-25 | Disposition: A | Discharge status: Home / Self Care | Payer: Medicare Other | Attending: Emergency Medicine | Admitting: Emergency Medicine

## 2016-08-25 ENCOUNTER — Emergency Department (HOSPITAL_COMMUNITY): Payer: Medicare Other

## 2016-08-25 DIAGNOSIS — J209 Acute bronchitis, unspecified: Secondary | ICD-10-CM

## 2016-08-25 DIAGNOSIS — Z9981 Dependence on supplemental oxygen: Secondary | ICD-10-CM | POA: Diagnosis not present

## 2016-08-25 DIAGNOSIS — B3731 Acute candidiasis of vulva and vagina: Secondary | ICD-10-CM

## 2016-08-25 DIAGNOSIS — Z87891 Personal history of nicotine dependence: Secondary | ICD-10-CM | POA: Insufficient documentation

## 2016-08-25 DIAGNOSIS — J440 Chronic obstructive pulmonary disease with acute lower respiratory infection: Secondary | ICD-10-CM | POA: Diagnosis not present

## 2016-08-25 DIAGNOSIS — R0602 Shortness of breath: Secondary | ICD-10-CM | POA: Diagnosis present

## 2016-08-25 DIAGNOSIS — B373 Candidiasis of vulva and vagina: Secondary | ICD-10-CM

## 2016-08-25 DIAGNOSIS — E039 Hypothyroidism, unspecified: Secondary | ICD-10-CM | POA: Diagnosis not present

## 2016-08-25 DIAGNOSIS — Z85118 Personal history of other malignant neoplasm of bronchus and lung: Secondary | ICD-10-CM | POA: Insufficient documentation

## 2016-08-25 DIAGNOSIS — E860 Dehydration: Secondary | ICD-10-CM | POA: Diagnosis not present

## 2016-08-25 DIAGNOSIS — J441 Chronic obstructive pulmonary disease with (acute) exacerbation: Secondary | ICD-10-CM

## 2016-08-25 LAB — COMPREHENSIVE METABOLIC PANEL
ALT: 15 U/L (ref 14–54)
ANION GAP: 10 (ref 5–15)
AST: 39 U/L (ref 15–41)
Albumin: 3.8 g/dL (ref 3.5–5.0)
Alkaline Phosphatase: 106 U/L (ref 38–126)
BUN: 14 mg/dL (ref 6–20)
CHLORIDE: 103 mmol/L (ref 101–111)
CO2: 23 mmol/L (ref 22–32)
Calcium: 9 mg/dL (ref 8.9–10.3)
Creatinine, Ser: 0.86 mg/dL (ref 0.44–1.00)
GFR calc non Af Amer: >60 mL/min (ref >60)
Glucose, Bld: 91 mg/dL (ref 65–99)
Potassium: 5.3 mmol/L — ABNORMAL HIGH (ref 3.5–5.1)
SODIUM: 136 mmol/L (ref 135–145)
Total Bilirubin: 1.4 mg/dL — ABNORMAL HIGH (ref 0.3–1.2)
Total Protein: 7.7 g/dL (ref 6.5–8.1)

## 2016-08-25 LAB — CBC WITH DIFFERENTIAL/PLATELET
Basophils Absolute: 0 10*3/uL (ref 0.0–0.1)
Basophils Relative: 0 %
Eosinophils Absolute: 0.2 10*3/uL (ref 0.0–0.7)
Eosinophils Relative: 3 %
HEMATOCRIT: 38.4 % (ref 36.0–46.0)
HEMOGLOBIN: 12.9 g/dL (ref 12.0–15.0)
LYMPHS ABS: 1.7 10*3/uL (ref 0.7–4.0)
Lymphocytes Relative: 25 %
MCH: 29.5 pg (ref 26.0–34.0)
MCHC: 33.6 g/dL (ref 30.0–36.0)
MCV: 87.7 fL (ref 78.0–100.0)
MONOS PCT: 5 %
Monocytes Absolute: 0.3 10*3/uL (ref 0.1–1.0)
NEUTROS ABS: 4.6 10*3/uL (ref 1.7–7.7)
NEUTROS PCT: 67 %
Platelets: 263 10*3/uL (ref 150–400)
RBC: 4.38 MIL/uL (ref 3.87–5.11)
RDW: 15.7 % — ABNORMAL HIGH (ref 11.5–15.5)
WBC: 6.9 10*3/uL (ref 4.0–10.5)

## 2016-08-25 LAB — URINALYSIS, ROUTINE W REFLEX MICROSCOPIC
BILIRUBIN URINE: NEGATIVE
Glucose, UA: NEGATIVE mg/dL
Hgb urine dipstick: NEGATIVE
Ketones, ur: NEGATIVE mg/dL
Leukocytes, UA: NEGATIVE
NITRITE: NEGATIVE
PH: 5 (ref 5.0–8.0)
Protein, ur: NEGATIVE mg/dL
SPECIFIC GRAVITY, URINE: 1.017 (ref 1.005–1.030)

## 2016-08-25 LAB — TROPONIN I: Troponin I: <0.03 ng/mL (ref <0.03)

## 2016-08-25 MED ORDER — AZITHROMYCIN 250 MG PO TABS
500.0000 mg | ORAL_TABLET | Freq: Once | ORAL | Status: AC
  Administered 2016-08-25: 500 mg via ORAL
  Filled 2016-08-25: qty 2

## 2016-08-25 MED ORDER — SODIUM CHLORIDE 0.9 % IV BOLUS (SEPSIS)
1000.0000 mL | Freq: Once | INTRAVENOUS | Status: AC
  Administered 2016-08-25: 1000 mL via INTRAVENOUS

## 2016-08-25 MED ORDER — FLUCONAZOLE 150 MG PO TABS
150.0000 mg | ORAL_TABLET | Freq: Once | ORAL | Status: AC
  Administered 2016-08-25: 150 mg via ORAL
  Filled 2016-08-25: qty 1

## 2016-08-25 MED ORDER — PREDNISONE 10 MG (21) PO TBPK: ORAL_TABLET | ORAL | 0 refills | Status: DC

## 2016-08-25 MED ORDER — AZITHROMYCIN 250 MG PO TABS: ORAL_TABLET | ORAL | 0 refills | Status: DC

## 2016-08-25 MED ORDER — PROBIOTIC & ACIDOPHILUS EX ST PO CAPS: 1.0000 | ORAL_CAPSULE | Freq: Every day | ORAL | 0 refills | Status: DC

## 2016-08-25 MED ORDER — METHYLPREDNISOLONE SODIUM SUCC 125 MG IJ SOLR
125.0000 mg | Freq: Once | INTRAMUSCULAR | Status: AC
  Administered 2016-08-25: 125 mg via INTRAVENOUS
  Filled 2016-08-25: qty 2

## 2016-08-25 MED ORDER — IOPAMIDOL (ISOVUE-370) INJECTION 76%
INTRAVENOUS | Status: AC
  Administered 2016-08-25: 80 mL via INTRAVENOUS
  Filled 2016-08-25: qty 100

## 2016-08-25 MED ORDER — IPRATROPIUM-ALBUTEROL 0.5-2.5 (3) MG/3ML IN SOLN
3.0000 mL | Freq: Once | RESPIRATORY_TRACT | Status: AC
  Administered 2016-08-25: 3 mL via RESPIRATORY_TRACT
  Filled 2016-08-25: qty 3

## 2016-08-25 NOTE — ED Notes (Signed)
Dr.Haviland at bedside.

## 2016-08-25 NOTE — ED Provider Notes (Signed)
[place] [facility] DEPT Provider Note   CSN: [id number] Arrival date & time: 08/25/16  1512     History   Chief Complaint Chief Complaint  Patient presents with  . Shortness of Breath    HPI Dawn Mercer is a 71 y.o. female.  Pt presents to the ED today with exertional sob.  Pt does have a hx of COPD and RUL lung cancer.  The pt said that she had radiation treatment for her lung cancer which "burnt her lungs."  She is on chronic home O2 at 2L.  Lately, her oxygen saturation will decrease with any kind of exertion.  The pt was diagnosed with bronchitis last week.  She was put on doxycycline which she took for only 4 days.  She said it gave her a yeast infection, so she stopped.  She denies f/c.  The pt has an appt with Dr. Melvyn Novas (pulmonology) on July 3rd, but her breathing became so bad this morning, that she came to the ED.  Pt also c/o BP dropping and dizzy spells.  Her doctor recently took her off spiriva and symbicort and put her on another newer medication.      Past Medical History:  Diagnosis Date  . Anxiety   . Bronchitis    Hx: of  . 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 Chest congestion 04/11/2016  . Claustrophobia   . COPD (chronic obstructive pulmonary disease) ([place])   . GERD (gastroesophageal reflux disease)   . H/O hiatal hernia   . Hay fever    Hx: of  . History of radiation therapy 01/28/13-03/23/13   63 gray to right upper lobe  . Hypothyroidism   . IBS (irritable bowel syndrome)    Hx: of  . Lung cancer, upper lobe ([place]) dx'd 12/2012   chemo and XRt complete  . 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 Pneumonia    years ago  . Seizures ([place])    Hx: of over 40 years ago  . Shortness of breath    Hx: of with exertion  . Status post radiation therapy 10/1999  . 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 Gastroesophageal reflux disease   . Acute  on chronic respiratory failure with hypoxia ([place]) 06/16/2016  . COPD with acute exacerbation ([place]) 06/16/2016  . COPD exacerbation ([place]) 06/16/2016  . Chest pain 06/16/2016  . Hypothyroidism 06/16/2016  . Compression fracture of body of thoracic vertebra ([place]) 12/06/2015  . Compression fracture of thoracic vertebra ([facility]) 11/20/2015  . Back pain, thoracic 11/20/2015  . COPD mixed type ([place]) 05/31/2015  . Chronic hypoxemic respiratory failure ([place]) 05/31/2015  . Dyspnea 05/31/2015  . Symptomatic cholelithiasis 07/23/2013  . History of cervical cancer 04/26/2013  . Malignant neoplasm of upper lobe of lung ([place]) 01/14/2013    Past Surgical History:  Procedure Laterality Date  . CHOLECYSTECTOMY N/A 08/03/2013   Procedure: LAPAROSCOPIC CHOLECYSTECTOMY;  Surgeon: Harl Bowie, MD;  Location: [facility];  Service: General;  Laterality: N/A;  . COLONOSCOPY W/ BIOPSIES AND POLYPECTOMY     Hx: of  . IR GENERIC HISTORICAL  04/11/2016   IR REMOVAL TUN ACCESS W/ PORT W/O FL MOD SED 04/11/2016 Arne Cleveland, MD [facility]-INTERV RAD  . KYPHOPLASTY N/A 12/06/2015   Procedure: THORACIC 5 KYPHOPLASTY;  Surgeon: Melina Schools, MD;  Location: [facility];  Service: Orthopedics;  Laterality: N/A;  .  LASER ABLATION  04/2004   for VIN III  . PORTACATH PLACEMENT    . TUBAL LIGATION    . VIDEO BRONCHOSCOPY WITH ENDOBRONCHIAL NAVIGATION N/A 12/31/2012   Procedure: VIDEO BRONCHOSCOPY WITH ENDOBRONCHIAL NAVIGATION;  Surgeon: Melrose Nakayama, MD;  Location: [facility];  Service: Thoracic;  Laterality: N/A;  . VIDEO BRONCHOSCOPY WITH ENDOBRONCHIAL ULTRASOUND N/A 12/31/2012   Procedure: VIDEO BRONCHOSCOPY WITH ENDOBRONCHIAL ULTRASOUND;  Surgeon: Melrose Nakayama, MD;  Location: [place] [facility] [place];  Service: Thoracic;  Laterality: N/A;    OB History    No data available       Home Medications    Prior to Admission medications   Medication Sig Start Date End Date Taking? Authorizing Provider  albuterol (PROVENTIL HFA;VENTOLIN HFA)  108 (90 BASE) MCG/ACT inhaler Inhale 1 puff into the lungs every 6 (six) hours as needed for wheezing.   Yes [provider]  ALPRAZolam [name]) 0.5 MG tablet Take 0.5 tablets (0.25 mg total) by mouth 2 (two) times daily as needed for anxiety. Patient taking differently: Take 0.25 mg by mouth 2 (two) times daily as needed for anxiety. Called in to [facility] per Dr. Sondra Come on 01/11/16. 01/11/16  Yes Gery Pray, MD  Cholecalciferol (VITAMIN D3) 400 units CAPS Take 1 tablet by mouth daily.    Yes [provider]  dexlansoprazole (DEXILANT) 60 MG capsule Take 60 mg by mouth every other day.    Yes [provider]  gabapentin (NEURONTIN) 100 MG capsule Take 100 mg by mouth daily.   Yes [provider]  ipratropium-albuterol (DUONEB) 0.5-2.5 (3) MG/3ML SOLN Take 3 mLs by nebulization every 4 (four) hours as needed. Patient taking differently: Take 3 mLs by nebulization every 4 (four) hours as needed (pain).  06/17/16  Yes Barton Dubois, MD  levothyroxine (SYNTHROID, LEVOTHROID) 125 MCG tablet Take 125 mcg by mouth daily before breakfast.   Yes [provider]  azithromycin (ZITHROMAX) 250 MG tablet Take one pill daily as directed. 08/26/16   Isla Pence, MD  budesonide-formoterol [facility]) 160-4.5 MCG/ACT inhaler Inhale 2 puffs into the lungs 2 times daily at 12 noon and 4 pm. Patient not taking: Reported on 08/25/2016 06/17/16   Barton Dubois, MD  HYDROcodone-acetaminophen (NORCO) 5-325 MG tablet Take 1 tablet by mouth every 6 (six) hours as needed for severe pain. Patient not taking: Reported on 08/25/2016 01/11/16   Gery Pray, MD  OXYGEN Inhale into the lungs. 2 lpm with exertion    [provider]  predniSONE (STERAPRED UNI-PAK 21 TAB) 10 MG (21) TBPK tablet Take 6 tabs by mouth daily  for 2 days, then 5 tabs for 2 days, then 4 tabs for 2 days, then 3 tabs for 2 days, 2 tabs for 2 days, then 1 tab by mouth daily for 2 days 08/26/16   Isla Pence, MD  Probiotic Product (PROBIOTIC & ACIDOPHILUS EX ST) CAPS Take 1 capsule by mouth daily. 08/25/16   Isla Pence, MD    Family History Family History  Problem Relation Age of Onset  . Lung cancer Brother   . Heart disease Father   . Cervical cancer Sister   . Heart disease Brother   . Cancer Brother        small cell lung cancer    Social History Social History  Substance Use Topics  . Smoking status: Former Smoker    Packs/day: 1.00    Years: 15.00    Quit date: 02/25/1998  . Smokeless tobacco: Never Used  .  Alcohol use No     Allergies   Latex and Codeine   Review of Systems Review of Systems  Respiratory: Positive for cough, shortness of breath and wheezing.   All other systems reviewed and are negative.    Physical Exam Updated Vital Signs BP 130/78 (BP Location: Right Arm)   Pulse (!) 102   Temp 97.7 F (36.5 C) (Oral)   Resp 19   Ht 5\' 6"  (1.676 m)   Wt 80.7 kg (178 lb)   SpO2 96%   BMI 28.73 kg/m   Physical Exam  Constitutional: She is oriented to person, place, and time. She appears well-developed and well-nourished.  HENT:  Head: Normocephalic and atraumatic.  Right Ear: External ear normal.  Left Ear: External ear normal.  Nose: Nose normal.  Mouth/Throat: Oropharynx is clear and moist.  Eyes: Conjunctivae and EOM are normal. Pupils are equal, round, and reactive to light.  Neck: Normal range of motion. Neck supple.  Cardiovascular: Regular rhythm, normal heart sounds and intact distal pulses.  Tachycardia present.   Pulmonary/Chest: Effort normal. She has wheezes.  Abdominal: Soft. Bowel sounds are normal.  Musculoskeletal: Normal range of motion.  Neurological: She is alert and oriented to person, place, and time.  Skin: Skin is warm.  Psychiatric: She has a normal mood and affect. Her behavior is normal. Judgment and thought content normal.  Nursing note and vitals reviewed.    ED Treatments / Results  Labs (all labs  ordered are listed, but only abnormal results are displayed) Labs Reviewed  COMPREHENSIVE METABOLIC PANEL - Abnormal; Notable for the following:       Result Value   Potassium 5.3 (*)    Total Bilirubin 1.4 (*)    All other components within normal limits  CBC WITH DIFFERENTIAL/PLATELET - Abnormal; Notable for the following:    RDW 15.7 (*)    All other components within normal limits  URINALYSIS, ROUTINE W REFLEX MICROSCOPIC  TROPONIN I    EKG  EKG Interpretation  Date/Time:  Sunday August 25 2016 16:46:52 EDT Ventricular Rate:  88 PR Interval:    QRS Duration: 79 QT Interval:  369 QTC Calculation: 447 R Axis:   51 Text Interpretation:  Sinus rhythm Low voltage, precordial leads Confirmed by Isla Pence [phone]) on 08/25/2016 4:49:45 PM       Radiology Ct Angio Chest Pe W And/or Wo Contrast  Result Date: 08/25/2016 CLINICAL DATA:  70 year old female with shortness of breath. Lung cancer. EXAM: CT ANGIOGRAPHY CHEST WITH CONTRAST TECHNIQUE: Multidetector CT imaging of the chest was performed using the standard protocol during bolus administration of intravenous contrast. Multiplanar CT image reconstructions and MIPs were obtained to evaluate the vascular anatomy. CONTRAST:  80 mL Isovue 370 COMPARISON:  PET-CT 07/24/2016, chest CTA 06/15/2016, and earlier. FINDINGS: Cardiovascular: Good contrast bolus timing in the pulmonary arterial tree. The right upper lobe pulmonary artery has been obliterated since April (series 11, image 89 today versus series 10, image 93 previously). However there is no filling defect within the pulmonary arteries to suggest acute pulmonary embolism. Calcified aortic atherosclerosis. Calcified coronary artery atherosclerosis. No pericardial effusion. Mediastinum/Nodes: Right infrahilar and subcarinal lymph nodes seen on series 11, image 123 appear mildly increased since April, up to 14 mm short axis. The confluent soft tissue at the right mediastinum otherwise  appears stable since the prior CTA. No definite change in left hilar lymph nodes. Lungs/Pleura: Centrilobular emphysema. Architectural distortion in the right lung with volume loss and irregularity of  the right upper lobe airways. Perihilar and peripheral confluent lung opacity has not significantly changed since April (series 12, image 28). No right pleural effusion. Mild mosaic attenuation in the right lower lobe probably reflects gas trapping. New retained secretions in the trachea on series 12, image 21. Stable left lung aside from mosaic attenuation in the lower lobe probably reflecting atelectasis and gas trapping. Upper Abdomen: Stable and negative visualized liver, spleen, pancreas, adrenal glands, and bowel in the upper abdomen. Musculoskeletal: Stable visualized osseous structures. Including T5 and T6 compression fractures with T5 level augmentation. Review of the MIP images confirms the above findings. IMPRESSION: 1. The right upper lobe pulmonary artery has been obliterated since April, but this appears to be tumor and/or treatment related. No evidence of acute pulmonary embolus. 2. Subtle increased right infrahilar and subcarinal lymph nodes since 06/15/2016 (see series 11, image 123). Recommend attention on future restaging studies. 3. Otherwise stable right lung since April. 4. Underlying emphysema with no new pulmonary abnormality. 5. Aortic Atherosclerosis (ICD10-I70.0) and Emphysema (ICD10-J43.9). Electronically Signed   By: Genevie Ann M.D.   On: 08/25/2016 20:20    Procedures Procedures (including critical care time)  Medications Ordered in ED Medications  fluconazole (DIFLUCAN) tablet 150 mg (not administered)  azithromycin (ZITHROMAX) tablet 500 mg (not administered)  sodium chloride 0.9 % bolus 1,000 mL (0 mLs Intravenous Stopped 08/25/16 1900)  methylPREDNISolone sodium succinate (SOLU-MEDROL) 125 mg/2 mL injection 125 mg (125 mg Intravenous Given 08/25/16 1718)  ipratropium-albuterol  (DUONEB) 0.5-2.5 (3) MG/3ML nebulizer solution 3 mL (3 mLs Nebulization Given 08/25/16 1718)  iopamidol (ISOVUE-370) 76 % injection (80 mLs Intravenous Contrast Given 08/25/16 1946)     Initial Impression / Assessment and Plan / ED Course  I have reviewed the triage vital signs and the nursing notes.  Pertinent labs & imaging results that were available during my care of the patient were reviewed by me and considered in my medical decision making (see chart for details).     Pt is feeling much better.  She was offered admission b/c her O2 sats drop with exertion.  She wants to go home.  She knows to return if worse.  She has an appt with Dr. Melvyn Novas scheduled for July 3rd and is told to keep that appt.  She will be started on zithromax for bronchitis and treated with diflucan for her yeast infection.  She is told to take probiotics and yogurt with active cultures while on abx.  She is also told to drink coconut or electrolyte water if she notices her bp is low.  Final Clinical Impressions(s) / ED Diagnoses   Final diagnoses:  COPD exacerbation (HCC)  Vaginal candidiasis  Acute bronchitis, unspecified organism  Dehydration    New Prescriptions New Prescriptions   AZITHROMYCIN (ZITHROMAX) 250 MG TABLET    Take one pill daily as directed.   PREDNISONE (STERAPRED UNI-PAK 21 TAB) 10 MG (21) TBPK TABLET    Take 6 tabs by mouth daily  for 2 days, then 5 tabs for 2 days, then 4 tabs for 2 days, then 3 tabs for 2 days, 2 tabs for 2 days, then 1 tab by mouth daily for 2 days   PROBIOTIC PRODUCT (PROBIOTIC & ACIDOPHILUS EX ST) CAPS    Take 1 capsule by mouth daily.     Isla Pence, MD 08/25/16 2047

## 2016-08-25 NOTE — Discharge Instructions (Signed)
Coconut water.  Yogurt.

## 2016-08-25 NOTE — ED Notes (Signed)
ED Provider at bedside. 

## 2016-08-25 NOTE — ED Notes (Signed)
Bed: QP59 Expected date:  Expected time:  Means of arrival:  Comments: Triage 3

## 2016-08-25 NOTE — ED Triage Notes (Addendum)
Pt from home with complaints of exertional SOB. Pt has hx of lung cancer, but no longer receives treatment for this. Pt states she has had exertional sob x 2-3 weeks. Pt states she has been following up with her pulmonologist closely with this as they think one of her medications are causing this. Pt has an appointment with him on Tuesday. Pt also reports having a cold x 2 weeks. Pt was taking doxycycline but quit taking it after 4 days because it gave her a yeast infection. Pt states she is still coughing up green sputum. Pt is on 2L [place] at baseline and her oxygen drops with exertion to 76% with exertion. While sitting, pt's oxygen is maintained at 98%. Pt has clear left lung sounds, but wheezes on right side

## 2016-08-27 ENCOUNTER — Ambulatory Visit (INDEPENDENT_AMBULATORY_CARE_PROVIDER_SITE_OTHER): Payer: Medicare Other | Admitting: Internal Medicine

## 2016-08-27 ENCOUNTER — Encounter: Payer: Self-pay | Admitting: Internal Medicine

## 2016-08-27 VITALS — BP 112/74 | HR 72 | Ht 65.0 in | Wt 188.0 lb

## 2016-08-27 DIAGNOSIS — E669 Obesity, unspecified: Secondary | ICD-10-CM | POA: Insufficient documentation

## 2016-08-27 DIAGNOSIS — J9611 Chronic respiratory failure with hypoxia: Secondary | ICD-10-CM

## 2016-08-27 DIAGNOSIS — J449 Chronic obstructive pulmonary disease, unspecified: Secondary | ICD-10-CM

## 2016-08-27 LAB — PULMONARY FUNCTION TEST
DL/VA % PRED: 38 %
DL/VA: 1.91 ml/min/mmHg/L
DLCO UNC % PRED: 21 %
DLCO cor % pred: 23 %
DLCO cor: 5.93 ml/min/mmHg
DLCO unc: 5.44 ml/min/mmHg
FEF 25-75 Post: 0.67 L/sec
FEF 25-75 Pre: 0.61 L/sec
FEF2575-%CHANGE-POST: 10 %
FEF2575-%PRED-POST: 34 %
FEF2575-%Pred-Pre: 31 %
FEV1-%CHANGE-POST: 2 %
FEV1-%Pred-Post: 52 %
FEV1-%Pred-Pre: 51 %
FEV1-PRE: 1.21 L
FEV1-Post: 1.25 L
FEV1FVC-%CHANGE-POST: 3 %
FEV1FVC-%Pred-Pre: 82 %
FEV6-%Change-Post: 0 %
FEV6-%PRED-PRE: 64 %
FEV6-%Pred-Post: 64 %
FEV6-PRE: 1.91 L
FEV6-Post: 1.92 L
FEV6FVC-%Change-Post: 1 %
FEV6FVC-%PRED-PRE: 102 %
FEV6FVC-%Pred-Post: 103 %
FVC-%CHANGE-POST: 0 %
FVC-%PRED-POST: 61 %
FVC-%PRED-PRE: 62 %
FVC-POST: 1.93 L
FVC-Pre: 1.94 L
POST FEV1/FVC RATIO: 65 %
POST FEV6/FVC RATIO: 100 %
PRE FEV6/FVC RATIO: 99 %
Pre FEV1/FVC ratio: 63 %
RV % PRED: 80 %
RV: 1.8 L
TLC % pred: 72 %
TLC: 3.76 L

## 2016-08-27 MED ORDER — FLUTICASONE-UMECLIDIN-VILANT 100-62.5-25 MCG/INH IN AEPB: 1.0000 | INHALATION_SPRAY | Freq: Every day | RESPIRATORY_TRACT | 0 refills | Status: DC

## 2016-08-27 MED ORDER — FLUTICASONE-UMECLIDIN-VILANT 100-62.5-25 MCG/INH IN AEPB: 1.0000 | INHALATION_SPRAY | Freq: Every day | RESPIRATORY_TRACT | 11 refills | Status: DC

## 2016-08-27 NOTE — Assessment & Plan Note (Signed)
RA sats ok 08/27/2016  08/27/2016   Walked RA x one lap @ 185 stopped due to  desats improved on 4lpm per POC   As of 08/27/2016 2lpm at hs and prn with activity> titrate to keep over 90% on POC

## 2016-08-27 NOTE — Patient Instructions (Addendum)
Plan A = Automatic = Trelegy one click each am   Work on inhaler technique:  relax and gently blow all the way out then take a nice smooth deep breath back in, triggering the inhaler at same time you start breathing in.  Hold for up to 5 seconds if you can. Blow out thru nose. Rinse and gargle with water when done      Plan B = Backup Only use your albuterol as a rescue medication to be used if you can't catch your breath by resting or doing a relaxed purse lip breathing pattern.  - The less you use it, the better it will work when you need it. - Ok to use the inhaler up to 2 puffs  every 4 hours if you must but call for appointment if use goes up over your usual need - Don't leave home without it !!  (think of it like the spare tire for your car)   Plan C = Crisis - only use your albuterol nebulizer if you first try Plan B and it fails to help > ok to use the nebulizer up to every 4 hours but if start needing it regularly call for immediate appointment    Adjust 02 with walking to keep your 02 sats above 90% at all times    Please schedule a follow up office visit in 6 weeks, call sooner if needed to See Judson Roch NP

## 2016-08-27 NOTE — Assessment & Plan Note (Addendum)
PFT's  08/27/2016  FEV1 1.25 (52 % ) ratio 65  p 2 % improvement from saba p saba prior to study with DLCO  21/23 % corrects to 38 % for alv volume   - 08/27/2016  After extensive coaching HFA effectiveness =   75% > continue trelegy and prn saba   The discrepancy in  Airflow obstruction and dlco is c/w emphysema but also lack of perfusion to RUL due to tumor obstruction > rx is adequate 02.  For now trelegy is reasonable as a "plan A"  With prn saba but needs to understand how to use maint vs prns correctly and also navigate formulary restrictions  I had an extended discussion with the patient reviewing all relevant studies completed to date and  lasting 25 minutes of a 40  minute transition of care visit in extremely complex pt with  severe non-specific but potentially very serious refractory respiratory symptoms of uncertain and potentially multiple  etiologies.  Formulary restrictions will be an ongoing challenge for the forseable future and I would be happy to pick an alternative if the pt will first  provide me a list of them but pt  will need to return here for training for any new device that is required eg dpi vs hfa vs respimat.    In meantime we can always provide samples so the patient never runs out of any needed respiratory medications.   Each maintenance medication was reviewed in detail including most importantly the difference between maintenance and prns and under what circumstances the prns are to be triggered using an action plan format that is not reflected in the computer generated alphabetically organized AVS.    Please see AVS for specific instructions unique to this office visit that I personally wrote and verbalized to the the pt in detail and then reviewed with pt  by my nurse highlighting any changes in therapy/plan of care  recommended at today's visit.

## 2016-08-27 NOTE — Progress Notes (Signed)
Subjective:     Patient ID: Dawn Mercer, female   DOB: 09/05/1945,     MRN: [id number]  HPI   71 yowf  Quit smoking 2000  With dx cx ca    History of Present Illness Dawn Mercer is a 71 y.o. female former smoker with COPD, lung cancer ( 2014 reated with chemo radiation) and Bronchiectasis, for follow up of recent hospitalization for COPD exacerbation. She was previously  followed by Dr. Lenna Gilford, but has recently switched to Dr. Melvyn Novas.   07/08/2016 Hospital Follow Up For COPD exacerbation: Pt. Presents for hospital Follow Up: Pt. Was admitted for Acute on chronic respiratory failure from 4/21-4/23. She was positive for meta pneumovirus, which was thought to be the trigger of her exacerbation.Marland Kitchen She was treated with oxygen, IV steroids,scheduled BD;s and antibiotics.She was discharged home on Doxycycline and a prednisone taper which she has  Completed.She is maintained on Symbicort, Spiriva Respimat and Pro Air for rescue. She is using her DuoNebs  3-4 times daily. She states her breathing is no better. She continues to be dyspneic with oxygen saturation drops with minimal exertion. CTA in hospital was negative for PE. Pt.  states she does have some light green secretions in a small amount. She denies fever, chest pain, orthopnea or hemoptysis.She has not had PFT's since 2015. Ct Angio did show  right hilar soft tissue density that in cases right upper lobe pulmonary artery and bronchi. This appears slightly increased in size since previous imaging . Subcarinal adenopathy also appears slightly increased compared to prior CT; suggest correlation with PET-CT to evaluate for recurrent/metastatic disease. Pt. Is followed by Dr. Mohammed/ oncology.I have asked her to follow up with him regarding sooner PET imaging regarding the above finding.She is concerned about the worsening dyspnea with exertion.     NP eval  07/08/16  Prednisone taper; 10 mg tablets: 4 tabs x 2 days, 3 tabs x 2 days, 2 tabs x  2 days 1 tab x 2 days then stop. Start Zyrtec daily Therapeutic trial of Trelegy. Hold off on Symbicort and Spiriva.  Continue wearing oxygen at 2 L [place]  Call if secretion get worse so we can call in prescription. Follow up with Dr. Earlie Server regarding necessity for sooner PET scan as suggested by radiology after CT Angio on 06/15/2016.    08/27/2016 extended  f/u ov/Burman Bruington re: transition of care:   COPD GOLD II with very low dlco 02 dep 24/7  On no maint rx (ran out of trelegy  Chief Complaint  Patient presents with  . Follow-up    PFT's done today. Pt states went to ED 08/25/16 with SOB- she was told she had bronchitis and dehydration. She is feeling better today, on pred. She is using proair 3-4 x per day on average. She uses Duoneb 2-4 x per day.   2lpm at bedtime/ 2lpm walking  Liked trelegy the best and since ran out using proair qid/ no noct flares   No obvious day to day or daytime variability or assoc excess/ purulent sputum or mucus plugs or hemoptysis or cp or chest tightness, subjective wheeze or overt sinus or hb symptoms. No unusual exp hx or h/o childhood pna/ asthma or knowledge of premature birth.  Sleeping ok without nocturnal  or early am exacerbation  of respiratory  c/o's or need for noct saba. Also denies any obvious fluctuation of symptoms with weather or environmental changes or other aggravating or alleviating factors except as outlined above  Current Medications, Allergies, Complete Past Medical History, Past Surgical History, Family History, and Social History were reviewed in [employer] record.  ROS  The following are not active complaints unless bolded sore throat, dysphagia, dental problems, itching, sneezing,  nasal congestion or excess/ purulent secretions, ear ache,   fever, chills, sweats, unintended wt loss, classically pleuritic or exertional cp,  orthopnea pnd or leg swelling, presyncope, palpitations, abdominal pain, anorexia, nausea,  vomiting, diarrhea  or change in bowel or bladder habits, change in stools or urine, dysuria,hematuria,  rash, arthralgias, visual complaints, headache, numbness, weakness or ataxia or problems with walking or coordination,  change in mood/affect or memory.          Review of Systems     Objective:   Physical Exam  amb mod obese wf nad  Wt Readings from Last 3 Encounters:  08/27/16 188 lb (85.3 kg)  08/25/16 178 lb (80.7 kg)  08/01/16 188 lb 8 oz (85.5 kg)    Vital signs reviewed  - Note on arrival 02 sats  97% on RA     HEENT: nl dentition, turbinates bilaterally, and oropharynx. Nl external ear canals without cough reflex   NECK :  without JVD/Nodes/TM/ nl carotid upstrokes bilaterally   LUNGS: no acc muscle use,  Min barrel  contour chest with minimal insp and exp rhonchi p saba w/in 4 h of ov   CV:  RRR  no s3 or murmur or increase in P2, and no edema   ABD:  soft and nontender with nl inspiratory excursion in the supine position. No bruits or organomegaly appreciated, bowel sounds nl  MS:  Nl gait/ ext warm without deformities, calf tenderness, cyanosis or clubbing No obvious joint restrictions   SKIN: warm and dry without lesions    NEURO:  alert, approp, nl sensorium with  no motor or cerebellar deficits apparent.       I personally reviewed images and agree with radiology impression as follows:   Chest CT a  08/25/16  1. The right upper lobe pulmonary artery has been obliterated since April, but this appears to be tumor and/or treatment related. No evidence of acute pulmonary embolus. 2. Subtle increased right infrahilar and subcarinal lymph nodes since 06/15/2016 (see series 11, image 123). Recommend attention on future restaging studies. 3. Otherwise stable right lung since April. 4. Underlying emphysema with no new pulmonary abnormality.         Assessment:

## 2016-08-27 NOTE — Progress Notes (Signed)
PFT done today. 

## 2016-08-27 NOTE — Assessment & Plan Note (Signed)
Body mass index is 31.28 kg/m.  -  trending up Lab Results  Component Value Date   TSH 0.119 (L) 06/16/2016     Contributing to gerd risk/ doe/reviewed the need and the process to achieve and maintain neg calorie balance > defer f/u primary care including intermittently monitoring thyroid status

## 2016-09-30 ENCOUNTER — Other Ambulatory Visit: Payer: Medicare Other

## 2016-10-02 ENCOUNTER — Ambulatory Visit: Payer: Medicare Other | Admitting: Internal Medicine

## 2016-10-10 ENCOUNTER — Encounter: Payer: Self-pay | Admitting: Acute Care

## 2016-10-10 ENCOUNTER — Telehealth: Payer: Self-pay | Admitting: Acute Care

## 2016-10-10 ENCOUNTER — Ambulatory Visit (INDEPENDENT_AMBULATORY_CARE_PROVIDER_SITE_OTHER): Payer: Medicare Other | Admitting: Acute Care

## 2016-10-10 VITALS — BP 118/68 | HR 78 | Ht 65.5 in | Wt 187.6 lb

## 2016-10-10 DIAGNOSIS — J9611 Chronic respiratory failure with hypoxia: Secondary | ICD-10-CM | POA: Diagnosis not present

## 2016-10-10 DIAGNOSIS — C3412 Malignant neoplasm of upper lobe, left bronchus or lung: Secondary | ICD-10-CM | POA: Diagnosis not present

## 2016-10-10 DIAGNOSIS — J9621 Acute and chronic respiratory failure with hypoxia: Secondary | ICD-10-CM

## 2016-10-10 DIAGNOSIS — R009 Unspecified abnormalities of heart beat: Secondary | ICD-10-CM

## 2016-10-10 DIAGNOSIS — J441 Chronic obstructive pulmonary disease with (acute) exacerbation: Secondary | ICD-10-CM | POA: Diagnosis not present

## 2016-10-10 DIAGNOSIS — J449 Chronic obstructive pulmonary disease, unspecified: Secondary | ICD-10-CM

## 2016-10-10 NOTE — Assessment & Plan Note (Signed)
Continue wearing oxygen at 3 L [place]. Goal saturation is 88-92% We will walk you in the office today to determine need for oxygen

## 2016-10-10 NOTE — Patient Instructions (Addendum)
It is nice to meet you today. We will refer you to cardiology for work up. We will refer you for pulmonary rehab. [facility]) Continue Trelegy once daily  Rinse mouth after use. Continue using your Pro Air Rescue inhaler as needed for shortness of breath or wheezing. Follow up with Dr. Melvyn Novas in 1 month Please contact office for sooner follow up if symptoms do not improve or worsen or seek emergency care   Consider HRCT Consider prednisone trial

## 2016-10-10 NOTE — Telephone Encounter (Signed)
See instructions:  Plan A = Automatic = Trelegy one click each am   Work on inhaler technique:  relax and gently blow all the way out then take a nice smooth deep breath back in, triggering the inhaler at same time you start breathing in.  Hold for up to 5 seconds if you can. Blow out thru nose. Rinse and gargle with water when done  Plan B = Backup Only use your albuterol as a rescue medication to be used if you can't catch your breath by resting or doing a relaxed purse lip breathing pattern.  - The less you use it, the better it will work when you need it. - Ok to use the inhaler up to 2 puffs  every 4 hours if you must but call for appointment if use goes up over your usual need - Don't leave home without it !!  (think of it like the spare tire for your car)   Plan C = Crisis - only use your albuterol nebulizer if you first try Plan B and it fails to help > ok to use the nebulizer up to every 4 hours but if start needing it regularly call for immediate appointment    Adjust 02 with walking to keep your 02 sats above 90% at all times    So I did not tell her to stop her neb but use it as back up to saba hfa and ? What other instructions she did not follow.  Not clear whether prednisone perception is real or not or whether helping the airways vs parynchyma but already had CTa and has an explanation for most of her symptoms as outlined in my note  Can certainly rechallenge with pred short term:  pred 20 mg daily until better then 10 mg daily until seen and regroup

## 2016-10-10 NOTE — Progress Notes (Signed)
History of Present Illness Dawn Mercer is a 71 y.o. female former smoker with oxygen dependent  COPD Gold II with very low DLCO and Stage IIIA Non-Small Cell Lung Cancer ( 2014 treated with chemo/radiation). She is followed by Dr. Melvyn Novas.   10/10/2016  6 week follow up per Dr. Melvyn Novas.  Pt. Presents for follow up.She was hospitalized from 4/21-4/23. She was positive for meta pneumovirus, which was thought to be the trigger of her exacerbation.Marland Kitchen She was treated with oxygen, IV steroids,scheduled BD;s and antibiotics.She was discharged home on Doxycycline and a prednisone taper which she Completed. She was seen 07/08/2016  for hospital follow up for Acute on Chronic Respiratory Failure / COPD exacerbation.She was continuing to experience dyspnea. She was treated with another prednisone taper and started on  A therapeutic trial of Trelegy. She ran out of the medication ( Did not call the office) and went to the ED 08/25/2016.on 06/15/2016.  She did have an ED visit for continued dyspnea 08/25/2016. She states she was treated the week prior with doxycycline and a prednisone taper for bronchitis, but she only took 4 days worth of medication as she developed a yeast infection. She was offered admission but wanted to go home. She was discharged on zithromax and diflucan for her yeast infection and discharged home. Marland Kitchen   She was seen by Dr. Melvyn Novas 08/29/2016. Plan at that time was:  Plan A = Automatic = Trelegy one click each am  Work on inhaler technique: relax and gently blow all the way out then take a nice smooth deep breath back in, triggering the inhaler at same time you start breathing in. Hold for up to 5 seconds if you can. Blow out thru nose. Rinse and gargle with water when done  Plan B = Backup Only use your albuterol as a rescue medication to be used if you can't catch your breath by resting or doing a relaxed purse lip breathing pattern.  - The less you use it, the better it will work when you need  it. - Ok to use the inhaler up to 2 puffs every 4 hours if you must but call for appointment if use goes up over your usual need - Don't leave home without it !! (think of it like the spare tire for your car)  Plan C = Crisis - only use your albuterol nebulizer if you first try Plan B and it fails to help > ok to use the nebulizer up to every 4 hours but if start needing it regularly call for immediate appointment  Adjust 02 with walking to keep your 02 sats above 90% at all times  Please schedule a follow up office visit in 6 weeks, call sooner if needed to See Judson Roch NP She states she  was prescribed a z-pack and prednisone ( I cannot see documentation She completed both.  She presents today for follow up:10/10/2016  She states her breathing is no better. She is compliant with her Trelegy inhaler once daily. She is only using her nebulizer treatments when absolutely needed per Dr. Gustavus Bryant instructions.  She continues to be dyspneic with oxygen saturation drops with minimal exertion. CTA in hospital 7/1/2018was negative for PE.She monitors her saturations at home.She is stating there is a lot of variability in her heart rate ( 40's to 130"s). We discussed that her fingers are often cold, which may cause inaccurate saturations and heart rate. She rarely has any secretions. She states when she does cough it  is white. She states her nose is runny today. Nasal secretions are clear. She is not using any over the counter medications.She denies chest pain, fever, orthopnea or hemoptysis.She was started on oxygen about 1 year ago.She is complaining of dyspnea but is not using her neb treatments. She states nebulizer treatments do not help her.She states she gets relief with prednisone.  Test Results: PFT's  08/27/2016  FEV1 1.25 (52 % ) ratio 65  p 2 % improvement from saba p saba prior to study with DLCO  21/23 % corrects to 38 % for alv volume :  Pulmonary Function Diagnosis: Mild to moderate Obstructive  Airways Disease Mild Restriction Severe Diffusion Defect    PET Scan: 07/24/2016>>No clear evidence of disease recurrence>> CT Chest in 6 months for surveillance  CT Chest 08/25/2016>> The right upper lobe pulmonary artery has been obliterated since April, but this appears to be tumor and/or treatment related. No evidence of acute pulmonary embolus. Subtle increased right infrahilar and subcarinal lymph nodes since 06/15/2016 (see series 11, image 123). Recommend attention on future restaging studies. Otherwise stable right lung since April. Underlying emphysema with no new pulmonary abnormality.  CBC Latest Ref Rng & Units 08/25/2016 06/16/2016 06/15/2016  WBC 4.0 - 10.5 K/uL 6.9 7.7 9.1  Hemoglobin 12.0 - 15.0 g/dL 12.9 11.0(L) 12.6  Hematocrit 36.0 - 46.0 % 38.4 33.5(L) 37.5  Platelets 150 - 400 K/uL 263 156 158    BMP Latest Ref Rng & Units 08/25/2016 06/20/2016 06/16/2016  Glucose 65 - 99 mg/dL 91 146(H) 349(H)  BUN 6 - 20 mg/dL 14 26(H) 15  Creatinine 0.44 - 1.00 mg/dL 0.86 0.86 1.17(H)  Sodium 135 - 145 mmol/L 136 134(L) 131(L)  Potassium 3.5 - 5.1 mmol/L 5.3(H) 4.8 2.9(L)  Chloride 101 - 111 mmol/L 103 98(L) 95(L)  CO2 22 - 32 mmol/L 23 25 22   Calcium 8.9 - 10.3 mg/dL 9.0 9.7 8.5(L)     PFT    Component Value Date/Time   FEV1PRE 1.21 08/27/2016 1049   FEV1POST 1.25 08/27/2016 1049   FVCPRE 1.94 08/27/2016 1049   FVCPOST 1.93 08/27/2016 1049   TLC 3.76 08/27/2016 1049   DLCOUNC 5.44 08/27/2016 1049   PREFEV1FVCRT 63 08/27/2016 1049   PSTFEV1FVCRT 65 08/27/2016 1049    No results found.   Past medical hx Past Medical History:  Diagnosis Date  . Anxiety   . Bronchitis    Hx: of  . 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 Chest congestion 04/11/2016  . Claustrophobia   . COPD (chronic obstructive pulmonary disease) ([place])   . GERD (gastroesophageal reflux disease)   . H/O hiatal hernia   . Hay fever    Hx: of  . History of radiation  therapy 01/28/13-03/23/13   63 gray to right upper lobe  . Hypothyroidism   . IBS (irritable bowel syndrome)    Hx: of  . Lung cancer, upper lobe ([place]) dx'd 12/2012   chemo and XRt complete  . 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 Pneumonia    years ago  . Seizures ([place])    Hx: of over 40 years ago  . Shortness of breath    Hx: of with exertion  . Status post radiation therapy 10/1999  . 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 Smoking status: Former Smoker    Packs/day: 1.00  Years: 15.00    Quit date: 02/25/1998  . Smokeless tobacco: Never Used  . Alcohol use No    Ms.Wise reports that she quit smoking about 18 years ago. She has a 15.00 pack-year smoking history. She has never used smokeless tobacco. She reports that she does not drink alcohol or use drugs.  Tobacco Cessation: Former smoker , Quit 2000  Past surgical hx, Family hx, Social hx all reviewed.  Current Outpatient Prescriptions on File Prior to Visit  Medication Sig  . albuterol (PROVENTIL HFA;VENTOLIN HFA) 108 (90 BASE) MCG/ACT inhaler Inhale 1 puff into the lungs every 6 (six) hours as needed for wheezing.  Marland Kitchen ALPRAZolam (XANAX) 0.5 MG tablet Take 0.5 tablets (0.25 mg total) by mouth 2 (two) times daily as needed for anxiety. (Patient taking differently: Take 0.25 mg by mouth 2 (two) times daily as needed for anxiety. Called in to [facility] per Dr. Sondra Come on 01/11/16.)  . Cholecalciferol (VITAMIN D3) 400 units CAPS Take 1 tablet by mouth daily.   . Fluticasone-Umeclidin-Vilant (TRELEGY ELLIPTA) 100-62.5-25 MCG/INH AEPB Inhale 1 puff into the lungs daily.  Marland Kitchen gabapentin (NEURONTIN) 100 MG capsule Take 100 mg by mouth daily.  Marland Kitchen ipratropium-albuterol (DUONEB) 0.5-2.5 (3) MG/3ML SOLN Take 3 mLs by nebulization every 4 (four) hours as needed. (Patient taking differently: Take 3 mLs by nebulization every 4 (four) hours as needed (pain). )  .  levothyroxine (SYNTHROID, LEVOTHROID) 125 MCG tablet Take 125 mcg by mouth daily before breakfast.  . OXYGEN 2 lpm with exertion and sleep APS  . dexlansoprazole (DEXILANT) 60 MG capsule Take 60 mg by mouth every other day.    No current facility-administered medications on file prior to visit.      Allergies  Allergen Reactions  . Latex Dermatitis  . Codeine Nausea Only    Review Of Systems:  Constitutional:   No  weight loss, night sweats,  Fevers, chills, fatigue, or  lassitude.  HEENT:   No headaches,  Difficulty swallowing,  Tooth/dental problems, or  Sore throat,                No sneezing, itching, ear ache, nasal congestion, post nasal drip,   CV:  No chest pain,  Orthopnea, PND, swelling in lower extremities, anasarca, dizziness, palpitations, syncope.   GI  No heartburn, indigestion, abdominal pain, nausea, vomiting, diarrhea, change in bowel habits, loss of appetite, bloody stools.   Resp: + shortness of breath with exertion less at rest.  No excess mucus, no productive cough,  No non-productive cough,  No coughing up of blood.  No change in color of mucus.  No wheezing.  No chest wall deformity  Skin: no rash or lesions.  GU: no dysuria, change in color of urine, no urgency or frequency.  No flank pain, no hematuria   MS:  No joint pain or swelling.  No decreased range of motion.  No back pain.  Psych:  No change in mood or affect. No depression or anxiety.  No memory loss.   Vital Signs BP 118/68 (BP Location: Right Arm, Patient Position: Sitting, Cuff Size: Normal)   Pulse 78   Ht 5' 5.5" (1.664 m)   Wt 187 lb 9.6 oz (85.1 kg)   SpO2 94%   BMI 30.74 kg/m    Physical Exam:  General- No distress,  A&Ox3, pleasant, poor historian ENT: No sinus tenderness, TM clear, pale nasal mucosa, no oral exudate,no post nasal drip, no LAN Cardiac: S1, S2, regular rate and rhythm,  no murmur Chest: No wheeze/ rales/ dullness; no accessory muscle use, no nasal flaring,  no sternal retractions, diminished per bases. Abd.: Soft Non-tender, obese Ext: No clubbing cyanosis, edema Neuro:  Deconditioned at baseline Skin: No rashes, warm and dry Psych: normal mood and behavior   Assessment/Plan  Chronic respiratory failure with hypoxia (HCC) Continue wearing oxygen at 3 L [place]. Goal saturation is 88-92% We will walk you in the office today to determine need for oxygen  Malignant neoplasm of upper lobe of lung ([place]) Follow up CT chest 01/2017  per Dr. Earlie Server   COPD GOLD II with disporportionate decrease dlco  Continues Dyspnea Compliant with Trelegy daily Not using neb treatments Plan: We will refer you to cardiology for work up. We will refer you for pulmonary rehab. [facility]) Continue Trelegy once daily  Rinse mouth after use. Continue using your Pro Air Rescue inhaler/ neb treatments  as needed for shortness of breath or wheezing. Follow up with Dr. Melvyn Novas in 1 month Please contact office for sooner follow up if symptoms do not improve or worsen or seek emergency care  Consider HRCT and prednisone trial/ ILD  Work up. ? ILD fibrosis     Magdalen Spatz, NP 10/10/2016  7:20 PM

## 2016-10-10 NOTE — Assessment & Plan Note (Signed)
Follow up CT chest 01/2017  per Dr. Earlie Server

## 2016-10-10 NOTE — Assessment & Plan Note (Signed)
Continues Dyspnea Compliant with Trelegy daily Not using neb treatments Plan: We will refer you to cardiology for work up. We will refer you for pulmonary rehab. [facility]) Continue Trelegy once daily  Rinse mouth after use. Continue using your Pro Air Rescue inhaler/ neb treatments  as needed for shortness of breath or wheezing. Follow up with Dr. Melvyn Novas in 1 month Please contact office for sooner follow up if symptoms do not improve or worsen or seek emergency care  Consider HRCT and prednisone trial/ ILD  Work up. ? ILD fibrosis

## 2016-10-10 NOTE — Telephone Encounter (Signed)
I saw Ms. Gange today. She continues to have dyspnea.  She is compliant with Trelegy, but is not using her nebs Michela Pitcher you told her not to use them) She states that she gets relief of her dyspnea with prednisone. Do you think we should consider an HRCT and a short trial with prednisone?/ Maybe do some autoimmune labs and HRCT. Could this be secondary to her chemo/radiation treatment? ( Disproportionately low DLCO) I am sending tp pulmonary rehab at [facility] ( She is very deconditioned) I am also sending for cards consult. Please share your thoughts.  Thanks

## 2016-10-11 MED ORDER — PREDNISONE 10 MG PO TABS: ORAL_TABLET | ORAL | 0 refills | Status: DC

## 2016-10-11 NOTE — Telephone Encounter (Signed)
Pt has been made aware of SG's recommendations. Rx has been sent to preferred pharmacy.  Nothing further needed.

## 2016-10-11 NOTE — Progress Notes (Signed)
Chart and office note reviewed in detail along with available xrays/ labs > agree with a/p as outlined  The lack of response to nebs but reported improvement on prednisone suggests ILD but not obvious on cta could be related to RA or rx for lung ca, reasonable to check esr baseline and empirically rx with low doses of prednisone eg 20 mg per day until better then taper

## 2016-10-11 NOTE — Telephone Encounter (Addendum)
I honestly think she is not taking the neb treatments because she states they don't help her anyway. I will go ahead and call in a short-term prednisone 20 mg daily 2 weeks then 10 mg daily until she sees you.. She is for follow-up with you September 17 or 18th. Thanks Dr. Melvyn Novas Triage: Please call patient and let her know that we are going to start her on a short-term prednisone trial of 20 mg daily 2 weeks. After 2 weeks she is to decrease dose to 10 mg daily until she sees Dr. Melvyn Novas 11/11/2016. Please remind her to watch her blood sugars if she is diabetic. Please phone in prescription for prednisone 20 mg 2 weeks, then 10 mg 2 weeks. Dispensed 10 mg tabs, total of 45 tablets. Thanks so much

## 2016-10-11 NOTE — Addendum Note (Signed)
Addended by: Maryanna Shape A on: 10/11/2016 03:21 PM   Modules accepted: Orders

## 2016-10-24 ENCOUNTER — Telehealth: Payer: Self-pay | Admitting: Cardiovascular Disease

## 2016-10-24 ENCOUNTER — Encounter: Payer: Self-pay | Admitting: *Deleted

## 2016-10-24 ENCOUNTER — Ambulatory Visit (INDEPENDENT_AMBULATORY_CARE_PROVIDER_SITE_OTHER): Payer: Medicare Other | Admitting: Cardiovascular Disease

## 2016-10-24 ENCOUNTER — Encounter: Payer: Self-pay | Admitting: Cardiovascular Disease

## 2016-10-24 VITALS — BP 164/78 | HR 68 | Ht 65.5 in | Wt 186.0 lb

## 2016-10-24 DIAGNOSIS — R0609 Other forms of dyspnea: Secondary | ICD-10-CM

## 2016-10-24 DIAGNOSIS — R030 Elevated blood-pressure reading, without diagnosis of hypertension: Secondary | ICD-10-CM

## 2016-10-24 DIAGNOSIS — R0902 Hypoxemia: Secondary | ICD-10-CM

## 2016-10-24 DIAGNOSIS — I2510 Atherosclerotic heart disease of native coronary artery without angina pectoris: Secondary | ICD-10-CM | POA: Diagnosis not present

## 2016-10-24 NOTE — Telephone Encounter (Signed)
Pre-cert Verification for the following procedure   Lexiscan myoview set for 10-30-16 at [facility]

## 2016-10-24 NOTE — Progress Notes (Signed)
CARDIOLOGY CONSULT NOTE  Patient ID: Dawn Mercer MRN: [id number] DOB/AGE: 1946/01/06 71 y.o.  Admit date: (Not on file) Primary Physician: Lawerance Cruel, MD Referring Physician: Harrington Challenger  Reason for Consultation: SOB with reduced DLCO  HPI: Dawn Mercer is a 71 y.o. female who is being seen today for the evaluation of Shortness of breath with reduced DLCO at the request of Lawerance Cruel, MD.   She has a prior history of tobacco abuse with oxygen dependent COPD. She uses 3 L of oxygen. She has been managed by pulmonology with dyspnea with a disproportionate decrease in DLCO with respect to her COPD. She also has a history of lung cancer treated with chemotherapy and radiation.  Troponins were normal in April and July of this year.  She was evaluated in the ED for shortness of breath on 08/25/16. CT angiography of the chest did not demonstrate any acute pulmonary embolus. It is mentioned that the right upper lobe pulmonary artery had been obliterated since April but appear to be due to tumor and/or treatment related. There was a subtle increase in right infrahilar and subcarinal lymph nodes since 06/15/16.  Calcified coronary artery atherosclerosis was also commented upon.  ECG demonstrated sinus rhythm with no ischemic ST segment or T-wave abnormalities, nor any arrhythmias.  She quit smoking in 1999. She said she normally uses 2 L of oxygen but when she begins walking from her couch to the kitchen in a hurry, her heart weight increases to 136 bpm and oxygen saturations can drop to 76%. If she rests they increase back to the 90% range.  She denies exertional chest pain. She also denies leg swelling, orthopnea, and paroxysmal nocturnal dyspnea.  She is here with her husband.  Blood pressure is elevated today, 164/78, but it is usually reportedly normal.    Allergies  Allergen Reactions  . Latex Dermatitis  . Codeine Nausea Only    Current Outpatient  Prescriptions  Medication Sig Dispense Refill  . albuterol (PROVENTIL HFA;VENTOLIN HFA) 108 (90 BASE) MCG/ACT inhaler Inhale 1 puff into the lungs every 6 (six) hours as needed for wheezing.    Marland Kitchen ALPRAZolam (XANAX) 0.5 MG tablet Take 0.5 tablets (0.25 mg total) by mouth 2 (two) times daily as needed for anxiety. (Patient taking differently: Take 0.25 mg by mouth 2 (two) times daily as needed for anxiety. Called in to [facility] per Dr. Sondra Come on 01/11/16.) 90 tablet 0  . Cholecalciferol (VITAMIN D3) 400 units CAPS Take 1 tablet by mouth daily.     Marland Kitchen levothyroxine (SYNTHROID, LEVOTHROID) 125 MCG tablet Take 125 mcg by mouth daily before breakfast.    . OXYGEN 2 lpm with exertion and sleep APS    . predniSONE (DELTASONE) 10 MG tablet 2 tablets x 2 weeks then 1 tablet until followup 45 tablet 0   No current facility-administered medications for this visit.     Past Medical History:  Diagnosis Date  . Anxiety   . Bronchitis    Hx: of  . 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 Chest congestion 04/11/2016  . Claustrophobia   . COPD (chronic obstructive pulmonary disease) ([place])   . GERD (gastroesophageal reflux disease)   . H/O hiatal hernia   . Hay fever    Hx: of  . History of radiation therapy 01/28/13-03/23/13   63 gray to right upper lobe  . Hypothyroidism   . IBS (irritable bowel syndrome)  Hx: of  . Lung cancer, upper lobe ([place]) dx'd 12/2012   chemo and XRt complete  . 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 Pneumonia    years ago  . Seizures ([place])    Hx: of over 40 years ago  . Shortness of breath    Hx: of with exertion  . Status post radiation therapy 10/1999  . 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 CHOLECYSTECTOMY N/A 08/03/2013   Procedure: LAPAROSCOPIC CHOLECYSTECTOMY;  Surgeon: Harl Bowie, MD;  Location: [place];  Service: General;  Laterality: N/A;  .  COLONOSCOPY W/ BIOPSIES AND POLYPECTOMY     Hx: of  . IR GENERIC HISTORICAL  04/11/2016   IR REMOVAL TUN ACCESS W/ PORT W/O FL MOD SED 04/11/2016 Arne Cleveland, MD [facility]-INTERV RAD  . KYPHOPLASTY N/A 12/06/2015   Procedure: THORACIC 5 KYPHOPLASTY;  Surgeon: Melina Schools, MD;  Location: [place];  Service: Orthopedics;  Laterality: N/A;  . LASER ABLATION  04/2004   for VIN III  . PORTACATH PLACEMENT    . TUBAL LIGATION    . VIDEO BRONCHOSCOPY WITH ENDOBRONCHIAL NAVIGATION N/A 12/31/2012   Procedure: VIDEO BRONCHOSCOPY WITH ENDOBRONCHIAL NAVIGATION;  Surgeon: Melrose Nakayama, MD;  Location: [place];  Service: Thoracic;  Laterality: N/A;  . VIDEO BRONCHOSCOPY WITH ENDOBRONCHIAL ULTRASOUND N/A 12/31/2012   Procedure: VIDEO BRONCHOSCOPY WITH ENDOBRONCHIAL ULTRASOUND;  Surgeon: Melrose Nakayama, MD;  Location: [place];  Service: Thoracic;  Laterality: N/A;    Social History   Social History  . Marital status: Married    Spouse name: N/A  . Number of children: N/A  . Years of education: N/A   Occupational History  . Retired    Social History Main Topics  . Smoking status: Former Smoker    Packs/day: 1.00    Years: 15.00    Quit date: 02/25/1998  . Smokeless tobacco: Never Used  . Alcohol use No  . Drug use: No  . Sexual activity: Not Currently   Other Topics Concern  . Not on file   Social History Narrative  . No narrative on file     No family history of premature CAD in 1st degree relatives.  Current Meds  Medication Sig  . albuterol (PROVENTIL HFA;VENTOLIN HFA) 108 (90 BASE) MCG/ACT inhaler Inhale 1 puff into the lungs every 6 (six) hours as needed for wheezing.  Marland Kitchen ALPRAZolam (XANAX) 0.5 MG tablet Take 0.5 tablets (0.25 mg total) by mouth 2 (two) times daily as needed for anxiety. (Patient taking differently: Take 0.25 mg by mouth 2 (two) times daily as needed for anxiety. Called in to [facility] per Dr. Sondra Come on 01/11/16.)  . Cholecalciferol (VITAMIN D3) 400 units CAPS Take 1  tablet by mouth daily.   Marland Kitchen levothyroxine (SYNTHROID, LEVOTHROID) 125 MCG tablet Take 125 mcg by mouth daily before breakfast.  . OXYGEN 2 lpm with exertion and sleep APS  . predniSONE (DELTASONE) 10 MG tablet 2 tablets x 2 weeks then 1 tablet until followup      Review of systems complete and found to be negative unless listed above in HPI    Physical exam Blood pressure (!) 164/78, pulse 68, height 5' 5.5" (1.664 m), weight 186 lb (84.4 kg), SpO2 98 %. General: NAD, using O2 by [place] Neck: No JVD, no thyromegaly or thyroid nodule.  Lungs: Clear to auscultation bilaterally with normal respiratory effort. CV: Nondisplaced  PMI. Regular rate and rhythm, normal S1/S2, no S3/S4, no murmur.  No peripheral edema.  No carotid bruit.    Abdomen: Soft, nontender, no distention.  Skin: Intact without lesions or rashes.  Neurologic: Alert and oriented x 3.  Psych: Normal affect. HEENT: Normal.   ECG: Most recent ECG reviewed.   Labs: Lab Results  Component Value Date/Time   K 5.3 (H) 08/25/2016 05:12 PM   K 4.3 03/27/2016 09:58 AM   BUN 14 08/25/2016 05:12 PM   BUN 10.4 03/27/2016 09:58 AM   CREATININE 0.86 08/25/2016 05:12 PM   CREATININE 0.7 03/27/2016 09:58 AM   ALT 15 08/25/2016 05:12 PM   ALT 12 03/27/2016 09:58 AM   TSH 0.119 (L) 06/16/2016 05:00 AM   HGB 12.9 08/25/2016 05:12 PM   HGB 12.6 03/27/2016 09:58 AM     Lipids: No results found for: LDLCALC, LDLDIRECT, CHOL, TRIG, HDL      ASSESSMENT AND PLAN:  1. Exertional dyspnea with hypoxemia and coronary artery calcifications on CT scan: I will proceed with a nuclear myocardial perfusion imaging study to evaluate for ischemic heart disease (Lexiscan Myoview).  2. Elevated BP: Reportedly normal. She thinks it's due to anxiety. I will monitor.  3. COPD: Stable. Followed by pulmonary.   Disposition: Follow up in 6 weeks.  Signed: Kate Sable, M.D., F.A.C.C.  10/24/2016, 2:50 PM

## 2016-10-24 NOTE — Patient Instructions (Addendum)
Medication Instructions:  Continue all current medications.  Labwork: none  Testing/Procedures:  Your physician has requested that you have a lexiscan myoview. For further information please visit [email or web address]. Please follow instruction sheet, as given.  Office will contact with results via phone or letter.    Follow-Up: 6 weeks   Any Other Special Instructions Will Be Listed Below (If Applicable).  If you need a refill on your cardiac medications before your next appointment, please call your pharmacy.

## 2016-10-25 ENCOUNTER — Encounter: Payer: Self-pay | Admitting: *Deleted

## 2016-10-30 ENCOUNTER — Encounter (HOSPITAL_BASED_OUTPATIENT_CLINIC_OR_DEPARTMENT_OTHER)
Admission: RE | Admit: 2016-10-30 | Discharge: 2016-10-30 | Disposition: A | Discharge status: Home / Self Care | Payer: Medicare Other | Source: Ambulatory Visit | Attending: Cardiovascular Disease | Admitting: Cardiovascular Disease

## 2016-10-30 ENCOUNTER — Encounter (HOSPITAL_COMMUNITY)
Admission: RE | Admit: 2016-10-30 | Discharge: 2016-10-30 | Disposition: A | Discharge status: Home / Self Care | Payer: Medicare Other | Source: Ambulatory Visit | Attending: Cardiovascular Disease | Admitting: Cardiovascular Disease

## 2016-10-30 ENCOUNTER — Encounter (HOSPITAL_COMMUNITY): Payer: Self-pay

## 2016-10-30 DIAGNOSIS — R0609 Other forms of dyspnea: Secondary | ICD-10-CM | POA: Insufficient documentation

## 2016-10-30 DIAGNOSIS — R0902 Hypoxemia: Secondary | ICD-10-CM | POA: Diagnosis present

## 2016-10-30 LAB — NM MYOCAR MULTI W/SPECT W/WALL MOTION / EF
CHL CUP NUCLEAR SDS: 0
CHL CUP RESTING HR STRESS: 71 {beats}/min
LHR: 0.32
LVDIAVOL: 34 mL (ref 46–106)
LVSYSVOL: 15 mL
Peak HR: 93 {beats}/min
SRS: 1
SSS: 1
TID: 0.99

## 2016-10-30 MED ORDER — TECHNETIUM TC 99M TETROFOSMIN IV KIT
10.0000 | PACK | Freq: Once | INTRAVENOUS | Status: AC | PRN
  Administered 2016-10-30: 11 via INTRAVENOUS

## 2016-10-30 MED ORDER — SODIUM CHLORIDE 0.9% FLUSH
INTRAVENOUS | Status: AC
  Administered 2016-10-30: 10 mL via INTRAVENOUS
  Filled 2016-10-30: qty 10

## 2016-10-30 MED ORDER — REGADENOSON 0.4 MG/5ML IV SOLN
INTRAVENOUS | Status: AC
  Administered 2016-10-30: 0.4 mg via INTRAVENOUS
  Filled 2016-10-30: qty 5

## 2016-10-30 MED ORDER — TECHNETIUM TC 99M TETROFOSMIN IV KIT
30.0000 | PACK | Freq: Once | INTRAVENOUS | Status: AC | PRN
  Administered 2016-10-30: 30 via INTRAVENOUS

## 2016-11-01 ENCOUNTER — Telehealth: Payer: Self-pay | Admitting: *Deleted

## 2016-11-01 NOTE — Telephone Encounter (Signed)
Notes recorded by Laurine Blazer, LPN on 04/26/2565 at 2:09 AM EDT Patient notified. Copy to pmd. ------  Notes recorded by Herminio Commons, MD on 10/30/2016 at 4:41 PM EDT Normal.

## 2016-11-11 ENCOUNTER — Ambulatory Visit (INDEPENDENT_AMBULATORY_CARE_PROVIDER_SITE_OTHER): Payer: Medicare Other | Admitting: Internal Medicine

## 2016-11-11 ENCOUNTER — Encounter: Payer: Self-pay | Admitting: Internal Medicine

## 2016-11-11 VITALS — BP 114/70 | HR 74 | Ht 65.5 in | Wt 191.4 lb

## 2016-11-11 DIAGNOSIS — E669 Obesity, unspecified: Secondary | ICD-10-CM

## 2016-11-11 DIAGNOSIS — J9611 Chronic respiratory failure with hypoxia: Secondary | ICD-10-CM

## 2016-11-11 DIAGNOSIS — J449 Chronic obstructive pulmonary disease, unspecified: Secondary | ICD-10-CM

## 2016-11-11 NOTE — Progress Notes (Signed)
Subjective:     Patient ID: Dawn Mercer, female   DOB: 03-29-1945,     MRN: [id number]      51 yowf  Quit smoking 2000  With dx of cx ca    History of Present Illness Dawn Mercer is a 73 yowf  former smoker with COPD, lung cancer ( 2014 reated with chemo radiation) and Bronchiectasis, for follow up of recent hospitalization for COPD exacerbation. She was previously  followed by Dr. Lenna Gilford, but has recently switched to Dr. Melvyn Novas.   07/08/2016 Hospital Follow Up For COPD exacerbation: Pt. Presents for hospital Follow Up: Pt. Was admitted for Acute on chronic respiratory failure from 4/21-4/23. She was positive for meta pneumovirus, which was thought to be the trigger of her exacerbation.Dawn Mercer She was treated with oxygen, IV steroids,scheduled BD;s and antibiotics.She was discharged home on Doxycycline and a prednisone taper which she has  Completed.She is maintained on Symbicort, Spiriva Respimat and Pro Air for rescue. She is using her DuoNebs  3-4 times daily. She states her breathing is no better. She continues to be dyspneic with oxygen saturation drops with minimal exertion. CTA in hospital was negative for PE. Pt.  states she does have some light green secretions in a small amount. She denies fever, chest pain, orthopnea or hemoptysis.She has not had PFT's since 2015. Ct Angio did show  right hilar soft tissue density that in cases right upper lobe pulmonary artery and bronchi. This appears slightly increased in size since previous imaging . Subcarinal adenopathy also appears slightly increased compared to prior CT; suggest correlation with PET-CT to evaluate for recurrent/metastatic disease. Pt. Is followed by Dr. Mohammed/ oncology.I have asked her to follow up with him regarding sooner PET imaging regarding the above finding.She is concerned about the worsening dyspnea with exertion.     NP eval  10/10/16  rec  Prednisone taper; 10 mg tablets: 4 tabs x 2 days, 3 tabs x 2 days, 2 tabs  x 2 days 1 tab x 2 days then stop. Start Zyrtec daily Therapeutic trial of Trelegy. Hold off on Symbicort and Spiriva.  Continue wearing oxygen at 2 L [place]  Call if secretion get worse so we can call in prescription. Follow up with Dr. Earlie Server regarding necessity for sooner PET scan as suggested by radiology after CT Angio on 06/15/2016.    08/27/2016 extended  f/u ov/Ebbie Sorenson re: transition of care:   COPD GOLD II with very low dlco 02 dep 24/7  On no maint rx (ran out of trelegy  Chief Complaint  Patient presents with  . Follow-up    PFT's done today. Pt states went to ED 08/25/16 with SOB- she was told she had bronchitis and dehydration. She is feeling better today, on pred. She is using proair 3-4 x per day on average. She uses Duoneb 2-4 x per day.   2lpm at bedtime/ 2lpm walking  Liked trelegy the best and since ran out using proair qid/ no noct flares rec Plan A = Automatic = Trelegy one click each am  Work on inhaler technique:   Plan B = Backup Only use your albuterol as a rescue medication Plan C = Crisis - only use your albuterol nebulizer if you first try Plan B and it fails to help > ok to use the nebulizer up to every 4 hours but if start needing it regularly call for immediate appointment Adjust 02 with walking to keep your 02 sats above 90% at all times  11/11/2016  f/u ov/Nechemia Chiappetta re: copd II/ wears 02 just with walking but not checking sats walkng as rec  Chief Complaint  Patient presents with  . Follow-up    Breathing is unchanged since her last visit.   trelegy on vs off does not change doe  X across the room = MMRC4  = sob if tries to leave home or while getting dressed   proair works the best - 3-4 x per week  sats fine RA sitting and sleeping but 70's across the room without 02  Prednisone 20 vs 10 last day today > no difference Limited by " low fe" Supposed to be on 02 with ex not monitoring  sats Supposed to be on dexilant, takes it prn    No obvious day to day  or daytime variability or assoc excess/ purulent sputum or mucus plugs or hemoptysis or cp or chest tightness, subjective wheeze or overt sinus or hb symptoms. No unusual exp hx or h/o childhood pna/ asthma or knowledge of premature birth.  Sleeping ok flat without nocturnal  or early am exacerbation  of respiratory  c/o's or need for noct saba. Also denies any obvious fluctuation of symptoms with weather or environmental changes or other aggravating or alleviating factors except as outlined above   Current Allergies, Complete Past Medical History, Past Surgical History, Family History, and Social History were reviewed in [employer] record.  ROS  The following are not active complaints unless bolded sore throat, dysphagia, dental problems, itching, sneezing,  nasal congestion or disharge of excess mucus or purulent secretions, ear ache,   fever, chills, sweats, unintended wt loss or wt gain, classically pleuritic or exertional cp,  orthopnea pnd or leg swelling, presyncope, palpitations, abdominal pain, anorexia, nausea, vomiting, diarrhea  or change in bowel habits or bladder habits, change in stools or change in urine, dysuria, hematuria,  rash, arthralgias, visual complaints, headache, numbness, weakness or ataxia or problems with walking or coordination,  change in mood/affect or memory.        Current Meds  Medication Sig  . albuterol (PROVENTIL HFA;VENTOLIN HFA) 108 (90 BASE) MCG/ACT inhaler Inhale 1 puff into the lungs every 6 (six) hours as needed for wheezing.  Dawn Mercer ALPRAZolam (XANAX) 0.5 MG tablet Take 0.5 tablets (0.25 mg total) by mouth 2 (two) times daily as needed for anxiety. (Patient taking differently: Take 0.25 mg by mouth 2 (two) times daily as needed for anxiety. Called in to [facility] per Dr. Sondra Come on 01/11/16.)  . Cholecalciferol (DIALYVITE VITAMIN D 5000 PO) Take 1 capsule by mouth daily.  Dawn Mercer levothyroxine (SYNTHROID, LEVOTHROID) 125 MCG tablet Take 125 mcg  by mouth daily before breakfast. 6 days of the wk, then on Sundays take 1/2 tablet  . OXYGEN 2lpm as needed per pt  . predniSONE (DELTASONE) 10 MG tablet 2 tablets x 2 weeks then 1 tablet until followup (Patient taking differently: 1 tablet daily)  . UNABLE TO FIND Med Name: B-12 Injection once per month  . [DISCONTINUED] Fluticasone-Umeclidin-Vilant (TRELEGY ELLIPTA) 100-62.5-25 MCG/INH AEPB Inhale 1 puff into the lungs daily.            Objective:   Physical Exam  amb mod obese wf nad      11/11/2016       19 1   08/27/16 188 lb (85.3 kg)  08/25/16 178 lb (80.7 kg)  08/01/16 188 lb 8 oz (85.5 kg)    Vital signs reviewed  - Note on arrival 02 sats  95% on RA     HEENT: nl dentition, turbinates bilaterally, and oropharynx. Nl external ear canals without cough reflex   NECK :  without JVD/Nodes/TM/ nl carotid upstrokes bilaterally   LUNGS: no acc muscle use,  Min barrel  contour chest with mostly transmitted upper airway noise / no true wheeze  CV:  RRR  no s3 or murmur or increase in P2, and no edema   ABD:  soft and nontender with nl inspiratory excursion in the supine position. No bruits or organomegaly appreciated, bowel sounds nl  MS:  Nl gait/ ext warm without deformities, calf tenderness, cyanosis or clubbing No obvious joint restrictions   SKIN: warm and dry without lesions    NEURO:  alert, approp, nl sensorium with  no motor or cerebellar deficits apparent.       I personally reviewed images and agree with radiology impression as follows:   Chest CT a  08/25/16  1. The right upper lobe pulmonary artery has been obliterated since April, but this appears to be tumor and/or treatment related. No evidence of acute pulmonary embolus. 2. Subtle increased right infrahilar and subcarinal lymph nodes since 06/15/2016 (see series 11, image 123). Recommend attention on future restaging studies. 3. Otherwise stable right lung since April. 4. Underlying emphysema with no  new pulmonary abnormality.          Assessment:

## 2016-11-11 NOTE — Patient Instructions (Addendum)
Dexilant  Take 30-60 min before first meal of the day automatically daily   Change 02 to 4lpm when walking    GERD (REFLUX)  is an extremely common cause of respiratory symptoms just like yours , many times with no obvious heartburn at all.    It can be treated with medication, but also with lifestyle changes including elevation of the head of your bed (ideally with 6 inch  bed blocks),  Smoking cessation, avoidance of late meals, excessive alcohol, and avoid fatty foods, chocolate, peppermint, colas, red wine, and acidic juices such as orange juice.  NO MINT OR MENTHOL PRODUCTS SO NO COUGH DROPS   USE SUGARLESS CANDY INSTEAD (Jolley ranchers or Stover's or Life Savers) or even ice chips will also do - the key is to swallow to prevent all throat clearing. NO OIL BASED VITAMINS - use powdered substitutes.   Please schedule a follow up office visit in 6 weeks, call sooner if needed

## 2016-11-12 NOTE — Assessment & Plan Note (Signed)
RA sats ok 08/27/2016  08/27/2016   Walked RA x one lap @ 185 stopped due to  desats improved on 4lpm per POC  - 11/11/2016   Walked RA x one lap @ 185 stopped due to  desats unless 4lpm POC at moderate pace  As of 11/11/2016 2lpm at hs and with activity 4lpm or titrate to > 90%   Extremely poor insight into how/ when to use 02, no better on prednisone so doubt reversible airway or ILD at this point > just rx with 02 and prn saba

## 2016-11-12 NOTE — Assessment & Plan Note (Signed)
Body mass index is 31.37 kg/m.  -  trending up slightly  Lab Results  Component Value Date   TSH 0.119 (L) on synthroid  06/16/2016     Contributing to gerd risk/ doe/reviewed the need and the process to achieve and maintain neg calorie balance > defer f/u primary care including intermittently monitoring thyroid status

## 2016-11-12 NOTE — Assessment & Plan Note (Addendum)
PFT's  08/27/2016  FEV1 1.25 (52 % ) ratio 65  p 2 % improvement from saba p saba prior to study with DLCO  21/23 % corrects to 38 % for alv volume   - 08/27/2016   continue trelegy and prn saba  - trial of pred stopped 11/11/2016 > no better 11/11/2016 so d/c'd  - 11/11/2016  After extensive coaching HFA effectiveness =    90%  Changed to saba prn/ d/c trelegy as not better on it/ resume dexilat daily  Denies any better on pred/trelegy vs off so rec leave off both and just use saba prn for now / adding dexilant back empirically for now plus diet then regroup in 6 weeks  I had an extended discussion with the patient reviewing all relevant studies completed to date and  lasting 15 to 20 minutes of a 25 minute visit    Each maintenance medication was reviewed in detail including most importantly the difference between maintenance and prns and under what circumstances the prns are to be triggered using an action plan format that is not reflected in the computer generated alphabetically organized AVS.    Please see AVS for specific instructions unique to this visit that I personally wrote and verbalized to the the pt in detail and then reviewed with pt  by my nurse highlighting any  changes in therapy recommended at today's visit to their plan of care.

## 2016-12-10 ENCOUNTER — Ambulatory Visit: Payer: Medicare Other | Admitting: Cardiovascular Disease

## 2016-12-23 ENCOUNTER — Encounter: Payer: Self-pay | Admitting: Internal Medicine

## 2016-12-23 ENCOUNTER — Ambulatory Visit (INDEPENDENT_AMBULATORY_CARE_PROVIDER_SITE_OTHER): Payer: Medicare Other | Admitting: Internal Medicine

## 2016-12-23 VITALS — BP 136/64 | HR 115 | Ht 66.0 in | Wt 183.8 lb

## 2016-12-23 DIAGNOSIS — K219 Gastro-esophageal reflux disease without esophagitis: Secondary | ICD-10-CM

## 2016-12-23 DIAGNOSIS — J9611 Chronic respiratory failure with hypoxia: Secondary | ICD-10-CM

## 2016-12-23 DIAGNOSIS — J449 Chronic obstructive pulmonary disease, unspecified: Secondary | ICD-10-CM

## 2016-12-23 MED ORDER — GLYCOPYRROLATE-FORMOTEROL 9-4.8 MCG/ACT IN AERO: 2.0000 | INHALATION_SPRAY | Freq: Two times a day (BID) | RESPIRATORY_TRACT | 0 refills | Status: DC

## 2016-12-23 MED ORDER — RABEPRAZOLE SODIUM 20 MG PO TBEC: DELAYED_RELEASE_TABLET | ORAL | 2 refills | Status: DC

## 2016-12-23 MED ORDER — GLYCOPYRROLATE-FORMOTEROL 9-4.8 MCG/ACT IN AERO: 2.0000 | INHALATION_SPRAY | Freq: Every day | RESPIRATORY_TRACT | 0 refills | Status: DC

## 2016-12-23 NOTE — Patient Instructions (Addendum)
Aciphex 20 mg Take 30-60 min before first meal of the day AUTOMATICALLY every day     GERD (REFLUX)  is an extremely common cause of respiratory symptoms just like yours , many times with no obvious heartburn at all.    It can be treated with medication, but also with lifestyle changes including elevation of the head of your bed (ideally with 6 inch  bed blocks),  Smoking cessation, avoidance of late meals, excessive alcohol, and avoid fatty foods, chocolate, peppermint, colas, red wine, and acidic juices such as orange juice.  NO MINT OR MENTHOL PRODUCTS SO NO COUGH DROPS  USE SUGARLESS CANDY INSTEAD (Jolley ranchers or Stover's or Life Savers) or even ice chips will also do - the key is to swallow to prevent all throat clearing. NO OIL BASED VITAMINS - use powdered substitutes.    Call Dr Harrington Challenger for prescription for your iron  - until this gets corrected you will likely need to turn up your 02 flow to keep the saturation above 90% at all times   Start BEVESPI  Take 2 puffs first thing in am and then another 2 puffs about 12 hours later.   Only use your albuterol (proair) as a rescue medication to be used if you can't catch your breath by resting or doing a relaxed purse lip breathing pattern.  - The less you use it, the better it will work when you need it. - Ok to use up to 2 puffs  every 4 hours if you must but call for immediate appointment if use goes up over your usual need - Don't leave home without it !!  (think of it like the spare tire for your car)    We will call [employer] to have them humidify your 02   Please schedule a follow up office visit in 4  weeks, call sooner if needed

## 2016-12-23 NOTE — Progress Notes (Signed)
Subjective:     Patient ID: Dawn Mercer, female   DOB: 01/17/1946,     MRN: [id number]      79 yowf  Quit smoking 2000  With dx of cx ca    History of Present Illness Dawn Mercer is a 36 yowf  former smoker with COPD, lung cancer ( 2014 treated with chemo radiation) and Bronchiectasis.    She was previously  followed by Dr. Lenna Gilford, but has recently switched to Dr. Melvyn Novas.   07/08/2016 Hospital Follow Up For COPD exacerbation: Pt. Presents for hospital Follow Up: Pt. Was admitted for Acute on chronic respiratory failure from 4/21-4/23. She was positive for meta pneumovirus, which was thought to be the trigger of her exacerbation.Marland Kitchen She was treated with oxygen, IV steroids,scheduled BD;s and antibiotics.She was discharged home on Doxycycline and a prednisone taper which she has  Completed.She is maintained on Symbicort, Spiriva Respimat and Pro Air for rescue. She is using her DuoNebs  3-4 times daily. She states her breathing is no better. She continues to be dyspneic with oxygen saturation drops with minimal exertion. CTA in hospital was negative for PE. Pt.  states she does have some light green secretions in a small amount. She denies fever, chest pain, orthopnea or hemoptysis.She has not had PFT's since 2015. Ct Angio did show  right hilar soft tissue density that in cases right upper lobe pulmonary artery and bronchi. This appears slightly increased in size since previous imaging . Subcarinal adenopathy also appears slightly increased compared to prior CT; suggest correlation with PET-CT to evaluate for recurrent/metastatic disease. Pt. Is followed by Dr. Mohammed/ oncology.I have asked her to follow up with him regarding sooner PET imaging regarding the above finding.She is concerned about the worsening dyspnea with exertion.     NP eval  10/10/16  rec  Prednisone taper; 10 mg tablets: 4 tabs x 2 days, 3 tabs x 2 days, 2 tabs x 2 days 1 tab x 2 days then stop. Start Zyrtec  daily Therapeutic trial of Trelegy. Hold off on Symbicort and Spiriva.  Continue wearing oxygen at 2 L [place]  Call if secretion get worse so we can call in prescription. Follow up with Dr. Earlie Server regarding necessity for sooner PET scan as suggested by radiology after CT Angio on 06/15/2016.    08/27/2016 extended  f/u ov/Szymon Foiles re: transition of care:   COPD GOLD II with very low dlco 02 dep 24/7  On no maint rx (ran out of trelegy  Chief Complaint  Patient presents with  . Follow-up    PFT's done today. Pt states went to ED 08/25/16 with SOB- she was told she had bronchitis and dehydration. She is feeling better today, on pred. She is using proair 3-4 x per day on average. She uses Duoneb 2-4 x per day.   2lpm at bedtime/ 2lpm walking  Liked trelegy the best and since ran out using proair qid/ no noct flares rec Plan A = Automatic = Trelegy one click each am  Work on inhaler technique:   Plan B = Backup Only use your albuterol as a rescue medication Plan C = Crisis - only use your albuterol nebulizer if you first try Plan B and it fails to help > ok to use the nebulizer up to every 4 hours but if start needing it regularly call for immediate appointment Adjust 02 with walking to keep your 02 sats above 90% at all times      11/11/2016  f/u ov/Nicha Hemann re: copd II/ wears 02 just with walking but not checking sats walkng as rec  Chief Complaint  Patient presents with  . Follow-up    Breathing is unchanged since her last visit.   trelegy on vs off does not change doe  X across the room = MMRC4  = sob if tries to leave home or while getting dressed   proair works the best - 3-4 x per week  sats fine RA sitting and sleeping but 70's across the room without 02  Prednisone 20 vs 10 last day today > no difference Limited by " low fe" Supposed to be on 02 with ex not monitoring  sats Supposed to be on dexilant, takes it prn  rec  Dexilant  Take 30-60 min before first meal of the day automatically  daily  Change 02 to 4lpm when walking  GERD . Please schedule a follow up office visit in 6 weeks, call sooner if needed     12/23/2016  f/u ov/Trevian Hayashida re:  Copd II/ 02 dep but still not checking sats with walking / ? No worse off prednisone  Chief Complaint  Patient presents with  . Follow-up    Breathing has been progressively worse since her last visit. She is wheezing some. She is not using her albuterol inhaler.   dexilant not able to take it due to abd cramps and still having overt HB  proair works the best but easily confused with details of care - esp prns Says knows she's anemic and fe rx rec but "husband couldn't fine the right fe rx so has not started.  Doe = MMRC3 = can't walk 100 yards even at a slow pace at a flat grade s stopping due to sob  On 3lpm    No obvious day to day or daytime variability or assoc excess/ purulent sputum or mucus plugs or hemoptysis or cp or chest tightness, subjective wheeze or overt sinus   symptoms. No unusual exp hx or h/o childhood pna/ asthma or knowledge of premature birth.  Sleeping ok flat without nocturnal  or early am exacerbation  of respiratory  c/o's or need for noct saba. Also denies any obvious fluctuation of symptoms with weather or environmental changes or other aggravating or alleviating factors except as outlined above   Current Allergies, Complete Past Medical History, Past Surgical History, Family History, and Social History were reviewed in [employer] record.  ROS  The following are not active complaints unless bolded Hoarseness, sore throat, dysphagia, dental problems, itching, sneezing,  nasal congestion or discharge of excess mucus or purulent secretions, ear ache,   fever, chills, sweats, unintended wt loss or wt gain, classically pleuritic or exertional cp,  orthopnea pnd or leg swelling, presyncope, palpitations, abdominal pain, anorexia, nausea, vomiting, diarrhea  or change in bowel habits or change  in bladder habits, change in stools or change in urine, dysuria, hematuria,  rash, arthralgias, visual complaints, headache, numbness, weakness or ataxia or problems with walking or coordination,  change in mood/affect or memory.        Current Meds  Medication Sig  . albuterol (PROVENTIL HFA;VENTOLIN HFA) 108 (90 BASE) MCG/ACT inhaler Inhale 1 puff into the lungs every 6 (six) hours as needed for wheezing.  Marland Kitchen ALPRAZolam (XANAX) 0.5 MG tablet Take 0.5 tablets (0.25 mg total) by mouth 2 (two) times daily as needed for anxiety. (Patient taking differently: Take 0.25 mg by mouth 2 (two) times daily as needed for  anxiety. Called in to [facility] per Dr. Sondra Come on 01/11/16.)  . Cholecalciferol (DIALYVITE VITAMIN D 5000 PO) Take 1 capsule by mouth daily.  Marland Kitchen levothyroxine (SYNTHROID, LEVOTHROID) 125 MCG tablet Take 125 mcg by mouth daily before breakfast. 6 days of the wk, then on Sundays take 1/2 tablet  . OXYGEN 2lpm with rest and 3lpm with exertion  . UNABLE TO FIND Med Name: B-12 Injection once per month                                 Objective:   Physical Exam  amb mod obese wf nad       12/23/2016      183   11/11/2016       19 1   08/27/16 188 lb (85.3 kg)  08/25/16 178 lb (80.7 kg)  08/01/16 188 lb 8 oz (85.5 kg)    Vital signs reviewed  - Note on arrival 02 sats  90% on 3lpm pulsed     HEENT: nl dentition, turbinates bilaterally, and oropharynx. Nl external ear canals without cough reflex   NECK :  without JVD/Nodes/TM/ nl carotid upstrokes bilaterally   LUNGS: no acc muscle use,  Min barrel  contour chest with continued  transmitted upper airway noise / no true wheeze  CV:  RRR  no s3 or murmur or increase in P2, and no edema   ABD:  soft and nontender with nl inspiratory excursion in the supine position. No bruits or organomegaly appreciated, bowel sounds nl  MS:  Nl gait/ ext warm without deformities, calf tenderness, cyanosis or clubbing No obvious joint  restrictions   SKIN: warm and dry without lesions    NEURO:  alert, approp, nl sensorium with  no motor or cerebellar deficits apparent.       I personally reviewed images and agree with radiology impression as follows:   Chest CT a  08/25/16  1. The right upper lobe pulmonary artery has been obliterated since April, but this appears to be tumor and/or treatment related. No evidence of acute pulmonary embolus. 2. Subtle increased right infrahilar and subcarinal lymph nodes since 06/15/2016 (see series 11, image 123). Recommend attention on future restaging studies. 3. Otherwise stable right lung since April. 4. Underlying emphysema with no new pulmonary abnormality.          Assessment:

## 2016-12-24 NOTE — Assessment & Plan Note (Addendum)
PFT's  08/27/2016  FEV1 1.25 (52 % ) ratio 65  p 2 % improvement from saba p saba prior to study with DLCO  21/23 % corrects to 38 % for alv volume   - 08/27/2016   continue trelegy and prn saba  - trial of pred stopped 11/11/2016 > no better 11/11/2016 so d/c'd  - 11/11/2016     Changed to saba prn/ d/c trelegy as not better on it/ resume dexilat daily> could not tolerate   - 12/23/2016  After extensive coaching HFA effectiveness =    75% > try bevespi 2 bid   Pt is Group B in terms of symptom/risk and laba/lama therefore appropriate rx at this point if can get used to using hfa effectively and afford it on her plan  Formulary restrictions will be an ongoing challenge for the forseable future and I would be happy to pick an alternative if the pt will first  provide me a list of them but pt  will need to return here for training for any new device that is required eg dpi vs hfa vs respimat.    In meantime we can always provide samples so the patient never runs out of any needed respiratory medications.   I had an extended discussion with the patient reviewing all relevant studies completed to date and  lasting 15 to 20 minutes of a 25 minute visit    Each maintenance medication was reviewed in detail including most importantly the difference between maintenance and prns and under what circumstances the prns are to be triggered using an action plan format that is not reflected in the computer generated alphabetically organized AVS.    Please see AVS for specific instructions unique to this visit that I personally wrote and verbalized to the the pt in detail and then reviewed with pt  by my nurse highlighting any  changes in therapy recommended at today's visit to their plan of care.

## 2016-12-24 NOTE — Assessment & Plan Note (Addendum)
RA sats ok 08/27/2016  08/27/2016   Walked RA x one lap @ 185 stopped due to  desats improved on 4lpm per POC  - 11/11/2016   Walked RA x one lap @ 185 stopped due to  desats unless 4lpm POC at moderate pace  As of 12/23/2016 2lpm at hs and with activity 4lpm or titrate to > 90%   Still not checking sats and titrating at rest and with activity as recommended   Added humidity to home 02 due to reported nasal congestion/ dryness on nasal 02 24/7

## 2016-12-24 NOTE — Assessment & Plan Note (Signed)
12/23/2016 reported gi intol to dexilant with overt HB s ppi > rec trial of aciphex 20 mg Take 30-60 min before first meal of the day

## 2016-12-30 ENCOUNTER — Encounter: Payer: Self-pay | Admitting: Cardiovascular Disease

## 2016-12-30 ENCOUNTER — Telehealth: Payer: Self-pay | Admitting: Internal Medicine

## 2016-12-30 ENCOUNTER — Ambulatory Visit (INDEPENDENT_AMBULATORY_CARE_PROVIDER_SITE_OTHER): Payer: Medicare Other | Admitting: Cardiovascular Disease

## 2016-12-30 VITALS — BP 122/82 | HR 106 | Ht 65.0 in | Wt 186.0 lb

## 2016-12-30 DIAGNOSIS — R030 Elevated blood-pressure reading, without diagnosis of hypertension: Secondary | ICD-10-CM

## 2016-12-30 DIAGNOSIS — I2510 Atherosclerotic heart disease of native coronary artery without angina pectoris: Secondary | ICD-10-CM

## 2016-12-30 DIAGNOSIS — R0609 Other forms of dyspnea: Secondary | ICD-10-CM

## 2016-12-30 DIAGNOSIS — R0902 Hypoxemia: Secondary | ICD-10-CM | POA: Diagnosis not present

## 2016-12-30 NOTE — Progress Notes (Signed)
SUBJECTIVE: The patient returns for follow-up after undergoing cardiovascular testing performed for the evaluation of exertional dyspnea with hypoxemia and coronary artery calcifications on CT scan.  Nuclear stress test on 10/30/16 was normal.  There is no evidence of myocardial ischemia or scar, LVEF 56%.  In summary, she has a prior history of tobacco abuse with oxygen dependent COPD. She uses 3 L of oxygen. She has been managed by pulmonology with symptoms of dyspnea with a disproportionate decrease in DLCO with respect to her COPD. She also has a history of lung cancer treated with chemotherapy and radiation.  She was evaluated in the ED for shortness of breath on 08/25/16. CT angiography of the chest did not demonstrate any acute pulmonary embolus. It is mentioned that the right upper lobe pulmonary artery had been obliterated since April but appear to be due to tumor and/or treatment related. There was a subtle increase in right infrahilar and subcarinal lymph nodes since 06/15/16.   She quit smoking in 1999. She said she normally uses 2 L of oxygen but when she begins walking from her couch to the kitchen in a hurry, her heart weight increases to 136 bpm and oxygen saturations can drop to 76%. If she rests they increase back to the 90% range.     Review of Systems: As per "subjective", otherwise negative.  Allergies  Allergen Reactions  . Latex Dermatitis  . Codeine Nausea Only    Current Outpatient Medications  Medication Sig Dispense Refill  . albuterol (PROVENTIL HFA;VENTOLIN HFA) 108 (90 BASE) MCG/ACT inhaler Inhale 1 puff into the lungs every 6 (six) hours as needed for wheezing.    Marland Kitchen ALPRAZolam (XANAX) 0.5 MG tablet Take 0.5 tablets (0.25 mg total) by mouth 2 (two) times daily as needed for anxiety. (Patient taking differently: Take 0.25 mg by mouth 2 (two) times daily as needed for anxiety. Called in to [facility] per Dr. Sondra Come on 01/11/16.) 90 tablet 0  . Cholecalciferol  (DIALYVITE VITAMIN D 5000 PO) Take 1 capsule by mouth daily.    . Glycopyrrolate-Formoterol (BEVESPI AEROSPHERE) 9-4.8 MCG/ACT AERO Inhale 2 Inhalers into the lungs 2 (two) times daily. Inhale 2 puffs daily, rinse mouth after use. 4 Inhaler 0  . levothyroxine (SYNTHROID, LEVOTHROID) 125 MCG tablet Take 125 mcg by mouth daily before breakfast. 6 days of the wk, then on Sundays take 1/2 tablet    . OXYGEN 2lpm with rest and 3lpm with exertion    . RABEprazole (ACIPHEX) 20 MG tablet Take 30-60 min before first meal of the day 30 tablet 2  . UNABLE TO FIND Med Name: B-12 Injection once per month     No current facility-administered medications for this visit.     Past Medical History:  Diagnosis Date  . Anxiety   . Bronchitis    Hx: of  . 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 Chest congestion 04/11/2016  . Claustrophobia   . COPD (chronic obstructive pulmonary disease) ([place])   . GERD (gastroesophageal reflux disease)   . H/O hiatal hernia   . Hay fever    Hx: of  . History of radiation therapy 01/28/13-03/23/13   63 gray to right upper lobe  . Hypothyroidism   . IBS (irritable bowel syndrome)    Hx: of  . Lung cancer, upper lobe ([place]) dx'd 12/2012   chemo and XRt complete  . 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  Pneumonia    years ago  . Seizures ([place])    Hx: of over 40 years ago  . Shortness of breath    Hx: of with exertion  . Status post radiation therapy 10/1999  . 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 COLONOSCOPY W/ BIOPSIES AND POLYPECTOMY     Hx: of  . IR GENERIC HISTORICAL  04/11/2016   IR REMOVAL TUN ACCESS W/ PORT W/O FL MOD SED 04/11/2016 Arne Cleveland, MD [facility]-INTERV RAD  . LASER ABLATION  04/2004   for VIN III  . PORTACATH PLACEMENT    . TUBAL LIGATION      Social History   Socioeconomic History  . Marital status: Married    Spouse name: Not on  file  . Number of children: Not on file  . Years of education: Not on file  . Highest education level: Not on file  Social Needs  . Financial resource strain: Not on file  . Food insecurity - worry: Not on file  . Food insecurity - inability: Not on file  . Transportation needs - medical: Not on file  . Transportation needs - non-medical: Not on file  Occupational History  . Occupation: Retired  Tobacco Use  . Smoking status: Former Smoker    Packs/day: 1.00    Years: 15.00    Pack years: 15.00    Last attempt to quit: 02/25/1998    Years since quitting: 18.8  . Smokeless tobacco: Never Used  Substance and Sexual Activity  . Alcohol use: No    Alcohol/week: 0.0 oz  . Drug use: No  . Sexual activity: Not Currently  Other Topics Concern  . Not on file  Social History Narrative  . Not on file     Vitals:   12/30/16 1505  BP: 122/82  Pulse: (!) 106  SpO2: (!) 88%  Weight: 186 lb (84.4 kg)  Height: 5\' 5"  (1.651 m)    Wt Readings from Last 3 Encounters:  12/30/16 186 lb (84.4 kg)  12/23/16 183 lb 12.8 oz (83.4 kg)  11/11/16 191 lb 6.4 oz (86.8 kg)     PHYSICAL EXAM General: NAD HEENT: Normal. Neck: No JVD, no thyromegaly. Lungs: Clear to auscultation bilaterally with normal respiratory effort. CV: Regular rate and rhythm, normal S1/S2, no S3/S4, no murmur. No pretibial or periankle edema. Abdomen: Soft, nontender, no distention.  Neurologic: Alert and oriented.  Psych: Normal affect. Skin: Normal. Musculoskeletal: No gross deformities.    ECG: Most recent ECG reviewed.   Labs: Lab Results  Component Value Date/Time   K 5.3 (H) 08/25/2016 05:12 PM   K 4.3 03/27/2016 09:58 AM   BUN 14 08/25/2016 05:12 PM   BUN 10.4 03/27/2016 09:58 AM   CREATININE 0.86 08/25/2016 05:12 PM   CREATININE 0.7 03/27/2016 09:58 AM   ALT 15 08/25/2016 05:12 PM   ALT 12 03/27/2016 09:58 AM   TSH 0.119 (L) 06/16/2016 05:00 AM   HGB 12.9 08/25/2016 05:12 PM   HGB 12.6  03/27/2016 09:58 AM     Lipids: No results found for: LDLCALC, LDLDIRECT, CHOL, TRIG, HDL     ASSESSMENT AND PLAN:  1. Exertional dyspnea with hypoxemia and coronary artery calcifications on CT scan:  Stress test was normal as detailed above. Left ventricular systolic function was normal.  No further cardiac testing is indicated at this time.  2. Elevated BP: Blood pressure is normal today.  3. COPD:  Stable. Followed by pulmonary.     Disposition: Follow up prn   Kate Sable, M.D., F.A.C.C.

## 2016-12-30 NOTE — Telephone Encounter (Signed)
OV notes have been faxed back and confirmation rec'd. Nothing more needed at this time.

## 2016-12-30 NOTE — Patient Instructions (Signed)
Medication Instructions:  Continue all current medications.  Labwork: none  Testing/Procedures: none  Follow-Up: As needed.    Any Other Special Instructions Will Be Listed Below (If Applicable).  If you need a refill on your cardiac medications before your next appointment, please call your pharmacy.  

## 2017-01-03 ENCOUNTER — Telehealth: Payer: Self-pay | Admitting: Internal Medicine

## 2017-01-03 NOTE — Telephone Encounter (Signed)
Fine with me

## 2017-01-03 NOTE — Telephone Encounter (Signed)
BQ please advise if you're willing to take on pt.  Thanks!

## 2017-01-03 NOTE — Telephone Encounter (Signed)
MW ok to switch to BQ?

## 2017-01-08 NOTE — Telephone Encounter (Signed)
Pt aware that we are awaiting BQ's response.

## 2017-01-08 NOTE — Telephone Encounter (Signed)
lmtcb x1 for pt. 

## 2017-01-08 NOTE — Telephone Encounter (Signed)
Pt returning call about change in providers.-tr

## 2017-01-09 NOTE — Telephone Encounter (Signed)
OK, 30 min only

## 2017-01-09 NOTE — Telephone Encounter (Signed)
Called spoke with patient 30 min appt scheduled with BQ for 12.6.18 @ 1030 - pt aware to arrive 65mins early for possible new patient paperwork 11.26.18 appt with MW cancelled  Nothing further needed; will sign off

## 2017-01-12 ENCOUNTER — Emergency Department (HOSPITAL_COMMUNITY): Payer: Medicare Other

## 2017-01-12 ENCOUNTER — Encounter (HOSPITAL_COMMUNITY): Payer: Self-pay | Admitting: *Deleted

## 2017-01-12 ENCOUNTER — Emergency Department (HOSPITAL_COMMUNITY)
Admission: EM | Admit: 2017-01-12 | Discharge: 2017-01-12 | Disposition: A | Discharge status: Home / Self Care | Payer: Medicare Other | Attending: Emergency Medicine | Admitting: Emergency Medicine

## 2017-01-12 DIAGNOSIS — E669 Obesity, unspecified: Secondary | ICD-10-CM | POA: Diagnosis not present

## 2017-01-12 DIAGNOSIS — Z79899 Other long term (current) drug therapy: Secondary | ICD-10-CM | POA: Diagnosis not present

## 2017-01-12 DIAGNOSIS — Z6835 Body mass index (BMI) 35.0-35.9, adult: Secondary | ICD-10-CM | POA: Diagnosis not present

## 2017-01-12 DIAGNOSIS — J701 Chronic and other pulmonary manifestations due to radiation: Secondary | ICD-10-CM | POA: Diagnosis not present

## 2017-01-12 DIAGNOSIS — Z87891 Personal history of nicotine dependence: Secondary | ICD-10-CM | POA: Insufficient documentation

## 2017-01-12 DIAGNOSIS — Z8541 Personal history of malignant neoplasm of cervix uteri: Secondary | ICD-10-CM | POA: Diagnosis not present

## 2017-01-12 DIAGNOSIS — C3402 Malignant neoplasm of left main bronchus: Secondary | ICD-10-CM

## 2017-01-12 DIAGNOSIS — R0602 Shortness of breath: Secondary | ICD-10-CM

## 2017-01-12 DIAGNOSIS — W888XXA Exposure to other ionizing radiation, initial encounter: Secondary | ICD-10-CM | POA: Insufficient documentation

## 2017-01-12 DIAGNOSIS — Z91040 Latex allergy status: Secondary | ICD-10-CM | POA: Diagnosis not present

## 2017-01-12 DIAGNOSIS — IMO0002 Reserved for concepts with insufficient information to code with codable children: Secondary | ICD-10-CM

## 2017-01-12 DIAGNOSIS — J441 Chronic obstructive pulmonary disease with (acute) exacerbation: Secondary | ICD-10-CM

## 2017-01-12 LAB — BASIC METABOLIC PANEL
Anion gap: 12 (ref 5–15)
BUN: 14 mg/dL (ref 6–20)
CO2: 23 mmol/L (ref 22–32)
CREATININE: 0.78 mg/dL (ref 0.44–1.00)
Calcium: 9.1 mg/dL (ref 8.9–10.3)
Chloride: 102 mmol/L (ref 101–111)
GFR calc Af Amer: >60 mL/min (ref >60)
GLUCOSE: 95 mg/dL (ref 65–99)
POTASSIUM: 3.7 mmol/L (ref 3.5–5.1)
SODIUM: 137 mmol/L (ref 135–145)

## 2017-01-12 LAB — CBC WITH DIFFERENTIAL/PLATELET
BASOS ABS: 0 10*3/uL (ref 0.0–0.1)
Basophils Relative: 0 %
EOS ABS: 0.1 10*3/uL (ref 0.0–0.7)
EOS PCT: 3 %
HCT: 36.2 % (ref 36.0–46.0)
Hemoglobin: 12.2 g/dL (ref 12.0–15.0)
LYMPHS ABS: 1.5 10*3/uL (ref 0.7–4.0)
LYMPHS PCT: 39 %
MCH: 30.7 pg (ref 26.0–34.0)
MCHC: 33.7 g/dL (ref 30.0–36.0)
MCV: 91.2 fL (ref 78.0–100.0)
MONO ABS: 0.1 10*3/uL (ref 0.1–1.0)
Monocytes Relative: 1 %
Neutro Abs: 2.3 10*3/uL (ref 1.7–7.7)
Neutrophils Relative %: 57 %
Platelets: 180 10*3/uL (ref 150–400)
RBC: 3.97 MIL/uL (ref 3.87–5.11)
RDW: 15.9 % — AB (ref 11.5–15.5)
WBC: 4 10*3/uL (ref 4.0–10.5)

## 2017-01-12 LAB — D-DIMER, QUANTITATIVE: D-Dimer, Quant: 2.26 ug/mL-FEU — ABNORMAL HIGH (ref 0.00–0.50)

## 2017-01-12 LAB — TROPONIN I

## 2017-01-12 MED ORDER — PREDNISONE 20 MG PO TABS: ORAL_TABLET | ORAL | 0 refills | Status: DC

## 2017-01-12 MED ORDER — ALBUTEROL SULFATE (2.5 MG/3ML) 0.083% IN NEBU
5.0000 mg | INHALATION_SOLUTION | Freq: Once | RESPIRATORY_TRACT | Status: AC
  Administered 2017-01-12: 5 mg via RESPIRATORY_TRACT
  Filled 2017-01-12: qty 6

## 2017-01-12 MED ORDER — IOPAMIDOL (ISOVUE-370) INJECTION 76%
100.0000 mL | Freq: Once | INTRAVENOUS | Status: AC | PRN
  Administered 2017-01-12: 100 mL via INTRAVENOUS

## 2017-01-12 MED ORDER — IOPAMIDOL (ISOVUE-370) INJECTION 76%
INTRAVENOUS | Status: AC
  Filled 2017-01-12: qty 100

## 2017-01-12 MED ORDER — SODIUM CHLORIDE 0.9 % IV SOLN
INTRAVENOUS | Status: DC
Start: 2017-01-12 — End: 2017-01-12
  Administered 2017-01-12: 16:00:00 via INTRAVENOUS

## 2017-01-12 MED ORDER — PREDNISONE 20 MG PO TABS
60.0000 mg | ORAL_TABLET | Freq: Once | ORAL | Status: AC
  Administered 2017-01-12: 60 mg via ORAL
  Filled 2017-01-12: qty 3

## 2017-01-12 MED ORDER — ALBUTEROL SULFATE (2.5 MG/3ML) 0.083% IN NEBU: 2.5000 mg | INHALATION_SOLUTION | RESPIRATORY_TRACT | 0 refills | Status: DC | PRN

## 2017-01-12 NOTE — ED Notes (Signed)
Patient transported to X-ray 

## 2017-01-12 NOTE — ED Provider Notes (Addendum)
[facility] DEPT Provider Note   CSN: [id number] Arrival date & time: 01/12/17  1453     History   Chief Complaint Chief Complaint  Patient presents with  . Shortness of Breath    HPI Dawn Mercer is a 71 y.o. female.  Who presents for evaluation of shortness several days, and not improving with home treatments.  She last had a COPD exacerbation requiring prednisone, 2 months ago.  She is on chronic home therapy with oxygen at 2-1/2-3 L/min.  She has had a mild cough recently but is not having fever, chills or significant sputum production.  She states that her pulmonologist does not want her to use bronchodilators, and that she has an appointment with a new pulmonologist coming up next month.  She denies chest pain, weakness or dizziness.  She complains of upper back pain present for several weeks, and worries that she has another compression fracture, in her thoracic vertebrae.  She last saw her pulmonologist, 12/23/16, and at that time was started on Bevespi-a combined long-acting product.  There are no other known modifying factors.   HPI  Past Medical History:  Diagnosis Date  . Anxiety   . Bronchitis    Hx: of  . 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 Chest congestion 04/11/2016  . Claustrophobia   . COPD (chronic obstructive pulmonary disease) ([place])   . GERD (gastroesophageal reflux disease)   . H/O hiatal hernia   . Hay fever    Hx: of  . History of radiation therapy 01/28/13-03/23/13   63 gray to right upper lobe  . Hypothyroidism   . IBS (irritable bowel syndrome)    Hx: of  . Lung cancer, upper lobe ([place]) dx'd 12/2012   chemo and XRt complete  . 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 Pneumonia    years ago  . Seizures ([place])    Hx: of over 40 years ago  . Shortness of breath    Hx: of with exertion  . Status post radiation therapy 10/1999  . VIN III (vulvar intraepithelial neoplasia  III) 04/2004    Patient Active Problem List   Diagnosis Date Noted  . Obesity (BMI 30-39.9) 08/27/2016  . Gastroesophageal reflux disease / clinical dx with overt HB    . Acute on chronic respiratory failure with hypoxia ([place]) 06/16/2016  . COPD with acute exacerbation ([place]) 06/16/2016  . COPD exacerbation ([place]) 06/16/2016  . Chest pain 06/16/2016  . Hypothyroidism 06/16/2016  . Compression fracture of body of thoracic vertebra ([place]) 12/06/2015  . Compression fracture of thoracic vertebra ([facility]) 11/20/2015  . Back pain, thoracic 11/20/2015  . COPD GOLD II with disporportionate decrease dlco  05/31/2015  . Chronic respiratory failure with hypoxia ([place]) 05/31/2015  . Dyspnea 05/31/2015  . Symptomatic cholelithiasis 07/23/2013  . History of cervical cancer 04/26/2013  . Malignant neoplasm of upper lobe of lung ([place]) 01/14/2013    Past Surgical History:  Procedure Laterality Date  . COLONOSCOPY W/ BIOPSIES AND POLYPECTOMY     Hx: of  . IR GENERIC HISTORICAL  04/11/2016   IR REMOVAL TUN ACCESS W/ PORT W/O FL MOD SED 04/11/2016 Arne Cleveland, MD [facility]-INTERV RAD  . LAPAROSCOPIC CHOLECYSTECTOMY N/A 08/03/2013   Performed by Coralie Keens, MD at [facility]  . LASER ABLATION  04/2004   for VIN III  . PORTACATH PLACEMENT    . THORACIC 5 KYPHOPLASTY N/A 12/06/2015  Performed by Melina Schools, MD at [facility]  . TUBAL LIGATION    . VIDEO BRONCHOSCOPY WITH ENDOBRONCHIAL NAVIGATION N/A 12/31/2012   Performed by Melrose Nakayama, MD at [facility]  . VIDEO BRONCHOSCOPY WITH ENDOBRONCHIAL ULTRASOUND N/A 12/31/2012   Performed by Melrose Nakayama, MD at [facility] OR    OB History    No data available       Home Medications    Prior to Admission medications   Medication Sig Start Date End Date Taking? Authorizing Provider  albuterol (PROVENTIL HFA;VENTOLIN HFA) 108 (90 BASE) MCG/ACT inhaler Inhale 1 puff into the lungs every 6 (six) hours as needed for wheezing.   Yes [provider]    ALPRAZolam [name]) 0.5 MG tablet Take 0.5 tablets (0.25 mg total) by mouth 2 (two) times daily as needed for anxiety. 01/11/16  Yes Gery Pray, MD  Cholecalciferol (DIALYVITE VITAMIN D 5000 PO) Take 1 capsule by mouth daily.   Yes [provider]  Glycopyrrolate-Formoterol (BEVESPI AEROSPHERE) 9-4.8 MCG/ACT AERO Inhale 2 Inhalers into the lungs 2 (two) times daily. Inhale 2 puffs daily, rinse mouth after use. 12/23/16  Yes Tanda Rockers, MD  iron polysaccharides (NIFEREX) 150 MG capsule Take 150 mg daily by mouth.   Yes [provider]  levothyroxine (SYNTHROID, LEVOTHROID) 125 MCG tablet Take 125 mcg by mouth daily before breakfast. 6 days of the wk, then on Sundays take 1/2 tablet   Yes [provider]  OXYGEN 2lpm with rest and 3lpm with exertion   Yes [provider]  RABEprazole (ACIPHEX) 20 MG tablet Take 30-60 min before first meal of the day 12/23/16  Yes Tanda Rockers, MD  terconazole (TERAZOL 3) 0.8 % vaginal cream Place 1 applicator daily vaginally. 12/24/16  Yes [provider]  UNABLE TO FIND Med Name: B-12 Injection once per month   Yes [provider]    Family History Family History  Problem Relation Age of Onset  . Lung cancer Brother   . Heart disease Father   . Cervical cancer Sister   . Heart disease Brother   . Cancer Brother        small cell lung cancer    Social History Social History   Tobacco Use  . Smoking status: Former Smoker    Packs/day: 1.00    Years: 15.00    Pack years: 15.00    Last attempt to quit: 02/25/1998    Years since quitting: 18.8  . Smokeless tobacco: Never Used  Substance Use Topics  . Alcohol use: No    Alcohol/week: 0.0 oz  . Drug use: No     Allergies   Latex and Codeine   Review of Systems Review of Systems  All other systems reviewed and are negative.    Physical Exam Updated Vital Signs BP (!) 110/94 (BP Location: Right Arm)   Pulse 90   Temp 97.6 F  (36.4 C) (Oral)   Resp (!) 21   SpO2 99%   Physical Exam  Constitutional: She is oriented to person, place, and time. She appears well-developed and well-nourished.  HENT:  Head: Normocephalic and atraumatic.  Eyes: Conjunctivae and EOM are normal. Pupils are equal, round, and reactive to light.  Neck: Normal range of motion and phonation normal. Neck supple.  Cardiovascular: Normal rate and regular rhythm.  Pulmonary/Chest: Effort normal. No respiratory distress. She exhibits no tenderness.  Somewhat decreased air movement bilaterally, but no audible wheezes.  No increased work of  breathing.  Abdominal: Soft. She exhibits no distension. There is no tenderness. There is no guarding.  Musculoskeletal: Normal range of motion. She exhibits no edema or tenderness.  Neurological: She is alert and oriented to person, place, and time. She exhibits normal muscle tone.  Skin: Skin is warm and dry.  Psychiatric: She has a normal mood and affect. Her behavior is normal. Judgment and thought content normal.  Nursing note and vitals reviewed.    ED Treatments / Results  Labs (all labs ordered are listed, but only abnormal results are displayed) Labs Reviewed  CBC WITH DIFFERENTIAL/PLATELET - Abnormal; Notable for the following components:      Result Value   RDW 15.9 (*)    All other components within normal limits  D-DIMER, QUANTITATIVE (NOT AT [facility]) - Abnormal; Notable for the following components:   D-Dimer, Quant 2.26 (*)    All other components within normal limits  BASIC METABOLIC PANEL  TROPONIN I    EKG  EKG Interpretation None      EKG   Rate: 113  Rhythm: sinus tachycardia  QRS Axis: normal  PR and QT Intervals: normal  ST/T Wave abnormalities: nonspecific T wave changes  PR and QRS Conduction Disutrbances:none  Narrative Interpretation:      Radiology Dg Chest 2 View  Result Date: 01/12/2017 CLINICAL DATA:  71 y/o F; shortness of breath. History of lung  cancer post radiation, COPD, bronchitis. EXAM: CHEST  2 VIEW COMPARISON:  06/15/2016 chest radiograph.  08/25/2016 CT chest. FINDINGS: Stable cardiomegaly. Stable volume loss in the right mid and upper lung zones with rightward displacement of mediastinum. Stable pleuroparenchymal scarring/fibrosis and the right mid and upper lung zones. No new focal consolidation. No pleural effusion or pneumothorax. Hyperinflated lungs with flattened diaphragms compatible with COPD. Stable T5 and T6 compression deformities. No new acute osseous abnormality identified. IMPRESSION: Stable cardiomegaly. Stable right mid and upper lung zone post radiation changes. Stable findings of COPD. No new acute pulmonary process identified. Electronically Signed   By: Kristine Garbe M.D.   On: 01/12/2017 16:13   Dg Thoracic Spine 2 View  Result Date: 01/12/2017 CLINICAL DATA:  History of lung cancer with radiation. Pain to the thoracic spine. EXAM: THORACIC SPINE 2 VIEWS COMPARISON:  08/25/2016 CT of the chest. FINDINGS: Stable T5 and T6 compression deformities post T5 kyphoplasty. No new loss of vertebral body height, malalignment, or acute osseous abnormality identified. IMPRESSION: Stable T5 and T6 compression deformities post T5 kyphoplasty. No new acute osseous abnormality identified. Electronically Signed   By: Kristine Garbe M.D.   On: 01/12/2017 16:15    Procedures Procedures (including critical care time)  Medications Ordered in ED Medications  0.9 %  sodium chloride infusion ( Intravenous New Bag/Given 01/12/17 1610)  predniSONE (DELTASONE) tablet 60 mg (not administered)  albuterol (PROVENTIL) (2.5 MG/3ML) 0.083% nebulizer solution 5 mg (5 mg Nebulization Given 01/12/17 1523)     Initial Impression / Assessment and Plan / ED Course  I have reviewed the triage vital signs and the nursing notes.  Pertinent labs & imaging results that were available during my care of the patient were reviewed by  me and considered in my medical decision making (see chart for details).      Patient Vitals for the past 24 hrs:  BP Temp Temp src Pulse Resp SpO2  01/12/17 1526 (!) 110/94 - - 90 (!) 21 99 %  01/12/17 1523 - - - - - 99 %  01/12/17 1504 130/84  97.6 F (36.4 C) Oral (!) 112 (!) 34 (!) 85 %    5:48 PM Reevaluation with update and discussion. After initial assessment and treatment, an updated evaluation reveals no change in clinical status.  Findings discussed with patient and husband, regarding current treatment and plan CT imaging to evaluate for pulmonary embolus. Daleen Bo      Final Clinical Impressions(s) / ED Diagnoses   Final diagnoses:  SOB (shortness of breath)   COPD with ongoing shortness of breath while on oxygen.  Patient states that she feels better when on Prednisone.  Screening evaluation negative for pneumonia.  CT angiogram chest to evaluate for PT because d-dimer was elevated.  Nursing Notes Reviewed/ Care Coordinated Applicable Imaging Reviewed Interpretation of Laboratory Data incorporated into ED treatment  Disposition-as per Dr. Darl Householder following return of CTA chest.  ED Discharge Orders    None       Daleen Bo, MD 01/12/17 1805    Daleen Bo, MD 01/12/17 [phone]

## 2017-01-12 NOTE — ED Provider Notes (Signed)
  Physical Exam  BP 113/85   Pulse 96   Temp 97.6 F (36.4 C) (Oral)   Resp 20   SpO2 95%   Physical Exam  ED Course  Procedures  MDM Care assumed at 5 pm. Patient has hx of COPD, lung cancer s/p radiation here with SOB. On 3L [place] at baseline and was noted to be hypoxic on arrival. D-dimer positive, sign out pending CTA chest.   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 O2 around 90-95% on 3L  (baseline). Will dc home with course of prednisone, albuterol prn.      Drenda Freeze, MD 01/12/17 [phone]

## 2017-01-12 NOTE — Discharge Instructions (Signed)
Take prednisone as prescribed.   Use albuterol every 4 hrs as needed for cough or wheezing   See your doctor and oncologist   Return to ER if you have worse shortness of breath, turning blue, trouble breathing, fever.

## 2017-01-12 NOTE — ED Notes (Signed)
Bed: OK59 Expected date:  Expected time:  Means of arrival:  Comments: Hold for Res B

## 2017-01-12 NOTE — ED Notes (Signed)
Patient transported to CT 

## 2017-01-12 NOTE — ED Triage Notes (Addendum)
Pt with known history of 02 dependency, SHOB sating 83% on 4 l, labored resp, pt pale and feels as if she can not breath. Pt has history of lung Ca and COPD

## 2017-01-20 ENCOUNTER — Ambulatory Visit: Payer: Medicare Other | Admitting: Internal Medicine

## 2017-01-29 ENCOUNTER — Encounter (HOSPITAL_COMMUNITY): Payer: Self-pay

## 2017-01-29 ENCOUNTER — Ambulatory Visit (HOSPITAL_COMMUNITY)
Admission: RE | Admit: 2017-01-29 | Discharge: 2017-01-29 | Disposition: A | Discharge status: Home / Self Care | Payer: Medicare Other | Source: Ambulatory Visit | Attending: Internal Medicine | Admitting: Internal Medicine

## 2017-01-29 ENCOUNTER — Other Ambulatory Visit (HOSPITAL_BASED_OUTPATIENT_CLINIC_OR_DEPARTMENT_OTHER): Payer: Medicare Other

## 2017-01-29 DIAGNOSIS — C3412 Malignant neoplasm of upper lobe, left bronchus or lung: Secondary | ICD-10-CM

## 2017-01-29 DIAGNOSIS — J449 Chronic obstructive pulmonary disease, unspecified: Secondary | ICD-10-CM

## 2017-01-29 DIAGNOSIS — Z85118 Personal history of other malignant neoplasm of bronchus and lung: Secondary | ICD-10-CM | POA: Diagnosis not present

## 2017-01-29 LAB — CBC WITH DIFFERENTIAL/PLATELET
BASO%: 0 % (ref 0.0–2.0)
BASOS ABS: 0 10*3/uL (ref 0.0–0.1)
EOS%: 4.1 % (ref 0.0–7.0)
Eosinophils Absolute: 0.1 10*3/uL (ref 0.0–0.5)
HEMATOCRIT: 36.2 % (ref 34.8–46.6)
HGB: 11.9 g/dL (ref 11.6–15.9)
LYMPH#: 1.1 10*3/uL (ref 0.9–3.3)
LYMPH%: 43 % (ref 14.0–49.7)
MCH: 31 pg (ref 25.1–34.0)
MCHC: 32.9 g/dL (ref 31.5–36.0)
MCV: 94.3 fL (ref 79.5–101.0)
MONO#: 0 10*3/uL — ABNORMAL LOW (ref 0.1–0.9)
MONO%: 1.2 % (ref 0.0–14.0)
NEUT#: 1.3 10*3/uL — ABNORMAL LOW (ref 1.5–6.5)
NEUT%: 51.7 % (ref 38.4–76.8)
PLATELETS: 113 10*3/uL — AB (ref 145–400)
RBC: 3.84 10*6/uL (ref 3.70–5.45)
RDW: 16.6 % — ABNORMAL HIGH (ref 11.2–14.5)
WBC: 2.4 10*3/uL — ABNORMAL LOW (ref 3.9–10.3)

## 2017-01-29 LAB — COMPREHENSIVE METABOLIC PANEL
ALK PHOS: 77 U/L (ref 40–150)
ALT: 14 U/L (ref 0–55)
AST: 18 U/L (ref 5–34)
Albumin: 3.3 g/dL — ABNORMAL LOW (ref 3.5–5.0)
Anion Gap: 10 mEq/L (ref 3–11)
BUN: 8.1 mg/dL (ref 7.0–26.0)
CALCIUM: 9.1 mg/dL (ref 8.4–10.4)
CHLORIDE: 104 meq/L (ref 98–109)
CO2: 27 meq/L (ref 22–29)
Creatinine: 0.8 mg/dL (ref 0.6–1.1)
EGFR: >60 mL/min/{1.73_m2} (ref >60)
GLUCOSE: 99 mg/dL (ref 70–140)
POTASSIUM: 4.5 meq/L (ref 3.5–5.1)
SODIUM: 141 meq/L (ref 136–145)
Total Bilirubin: 1.06 mg/dL (ref 0.20–1.20)
Total Protein: 7 g/dL (ref 6.4–8.3)

## 2017-01-29 MED ORDER — IOPAMIDOL (ISOVUE-300) INJECTION 61%
INTRAVENOUS | Status: AC
  Filled 2017-01-29: qty 75

## 2017-01-30 ENCOUNTER — Other Ambulatory Visit (INDEPENDENT_AMBULATORY_CARE_PROVIDER_SITE_OTHER): Payer: Medicare Other

## 2017-01-30 ENCOUNTER — Ambulatory Visit: Payer: Medicare Other | Admitting: Pulmonary Disease

## 2017-01-30 ENCOUNTER — Encounter: Payer: Self-pay | Admitting: Pulmonary Disease

## 2017-01-30 VITALS — BP 134/72 | HR 74 | Ht 65.5 in | Wt 185.6 lb

## 2017-01-30 DIAGNOSIS — J441 Chronic obstructive pulmonary disease with (acute) exacerbation: Secondary | ICD-10-CM | POA: Diagnosis not present

## 2017-01-30 DIAGNOSIS — J984 Other disorders of lung: Secondary | ICD-10-CM | POA: Diagnosis not present

## 2017-01-30 LAB — CBC WITH DIFFERENTIAL/PLATELET
BASOS ABS: 0 10*3/uL (ref 0.0–0.1)
Basophils Relative: 0.6 % (ref 0.0–3.0)
EOS PCT: 4.2 % (ref 0.0–5.0)
Eosinophils Absolute: 0.1 10*3/uL (ref 0.0–0.7)
HEMATOCRIT: 35.3 % — AB (ref 36.0–46.0)
Hemoglobin: 12 g/dL (ref 12.0–15.0)
LYMPHS PCT: 44.7 % (ref 12.0–46.0)
Lymphs Abs: 1.2 10*3/uL (ref 0.7–4.0)
MCHC: 33.9 g/dL (ref 30.0–36.0)
MCV: 93.8 fl (ref 78.0–100.0)
MONOS PCT: 1.4 % — AB (ref 3.0–12.0)
Monocytes Absolute: 0 10*3/uL — ABNORMAL LOW (ref 0.1–1.0)
Neutro Abs: 1.3 10*3/uL — ABNORMAL LOW (ref 1.4–7.7)
Neutrophils Relative %: 49.1 % (ref 43.0–77.0)
Platelets: 137 10*3/uL — ABNORMAL LOW (ref 150.0–400.0)
RBC: 3.77 Mil/uL — ABNORMAL LOW (ref 3.87–5.11)
RDW: 17.6 % — ABNORMAL HIGH (ref 11.5–15.5)
WBC: 2.6 10*3/uL — ABNORMAL LOW (ref 4.0–10.5)

## 2017-01-30 MED ORDER — PREDNISONE 20 MG PO TABS: 20.0000 mg | ORAL_TABLET | Freq: Every day | ORAL | 0 refills | Status: DC
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
## 2017-01-30 NOTE — Patient Instructions (Addendum)
COPD with acute exacerbation: Prednisone 20 mg daily for the next 7 days Keep using albuterol as needed for chest tightness wheezing or shortness of breath Continue taking Bevespi  We will check a serum IgE today in addition to other blood work to look for evidence of an allergy that may be causing her COPD to be worse  Increasing size of the right hilar mass in the setting of known lung cancer: I am very worried about this lesion, particularly in the setting of you feeling worse recently We will arrange for bronchoscopy at [facility] next week to assess this further  Follow up in 2 weeks

## 2017-01-31 ENCOUNTER — Telehealth: Payer: Self-pay | Admitting: Pulmonary Disease

## 2017-01-31 LAB — IGE: IGE (IMMUNOGLOBULIN E), SERUM: 60 kU/L (ref <=114)

## 2017-01-31 NOTE — Telephone Encounter (Signed)
[facility] can you reschedule bronch for pt?

## 2017-01-31 NOTE — Telephone Encounter (Signed)
No the doctor usually does

## 2017-02-01 ENCOUNTER — Other Ambulatory Visit: Payer: Self-pay | Admitting: Nurse Practitioner

## 2017-02-03 ENCOUNTER — Encounter (HOSPITAL_COMMUNITY): Payer: Medicare Other

## 2017-02-03 ENCOUNTER — Ambulatory Visit (HOSPITAL_COMMUNITY): Admission: RE | Admit: 2017-02-03 | Payer: Medicare Other | Source: Ambulatory Visit | Admitting: Pulmonary Disease

## 2017-02-03 ENCOUNTER — Encounter (HOSPITAL_COMMUNITY): Admission: RE | Payer: Self-pay | Source: Ambulatory Visit

## 2017-02-03 SURGERY — BRONCHOSCOPY, WITH FLUOROSCOPY
Anesthesia: Moderate Sedation | Laterality: Bilateral

## 2017-02-05 ENCOUNTER — Encounter: Payer: Self-pay | Admitting: Internal Medicine

## 2017-02-05 ENCOUNTER — Ambulatory Visit (HOSPITAL_BASED_OUTPATIENT_CLINIC_OR_DEPARTMENT_OTHER): Payer: Medicare Other | Admitting: Internal Medicine

## 2017-02-05 ENCOUNTER — Telehealth: Payer: Self-pay | Admitting: Internal Medicine

## 2017-02-05 DIAGNOSIS — Z85118 Personal history of other malignant neoplasm of bronchus and lung: Secondary | ICD-10-CM | POA: Diagnosis not present

## 2017-02-05 DIAGNOSIS — C3490 Malignant neoplasm of unspecified part of unspecified bronchus or lung: Secondary | ICD-10-CM

## 2017-02-05 NOTE — Progress Notes (Signed)
Called spoke with patient, advised of lab results / recs as stated by BQ.  Pt verbalized her understanding and denied any questions.

## 2017-02-05 NOTE — Progress Notes (Signed)
[place] Telephone:([phone]) [phone]   [phone]  OFFICE PROGRESS NOTE  Lawerance Cruel, MD [place] [address]  DIAGNOSIS: Stage IIIA (T1b., N2, M0) non-small cell lung cancer, squamous cell carcinoma presented with right upper lobe lung nodule in addition to mediastinal lymphadenopathy diagnosed in November of 2014.  PRIOR THERAPY:  1) Concurrent chemoradiation with weekly carboplatin for AUC of 2 and paclitaxel 45 mg/M2, status post 8 cycles, last dose was given 03/15/2013 with partial response. 2) Consolidation chemotherapy with carboplatin for AUC of 5 and paclitaxel 175 mg/M2 every 3 weeks with Neulasta support, status post 3 cycles. First dose 05/03/2013. Last dose was given 06/14/2013.  CURRENT THERAPY: Observation.  CHEMOTHERAPY INTENT: Control/curative  CURRENT # OF CHEMOTHERAPY CYCLES: 0  CURRENT ANTIEMETICS: Zofran, dexamethasone and Compazine  CURRENT SMOKING STATUS: Former smoker.  ORAL CHEMOTHERAPY AND CONSENT: None  CURRENT BISPHOSPHONATES USE: None  PAIN MANAGEMENT: 0/10  NARCOTICS INDUCED CONSTIPATION: None  LIVING WILL AND CODE STATUS: Full code   INTERVAL HISTORY: Dawn Mercer 71 y.o. female returns to the clinic today for follow-up visit accompanied by her husband.  The patient is feeling fine today with no specific complaints except for the baseline shortness of breath and she is currently on home oxygen.  She was recently treated for questionable pneumonia with antibiotics and prednisone.  She is feeling much better after this course of treatment.  She was also seen by Dr. Lake Bells and expected to have repeat bronchoscopy in the next few weeks for evaluation of her condition.  The patient denied having any current chest pain or hemoptysis.  She denied having any recent weight loss or night sweats.  She has no nausea, vomiting, diarrhea or constipation.  She had repeat CT scan of the chest performed  recently and she is here for evaluation and discussion of her risk her results.   MEDICAL HISTORY: Past Medical History:  Diagnosis Date  . Anxiety   . Bronchitis    Hx: of  . 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 Chest congestion 04/11/2016  . Claustrophobia   . COPD (chronic obstructive pulmonary disease) ([place])   . GERD (gastroesophageal reflux disease)   . H/O hiatal hernia   . Hay fever    Hx: of  . History of radiation therapy 01/28/13-03/23/13   63 gray to right upper lobe  . Hypothyroidism   . IBS (irritable bowel syndrome)    Hx: of  . Lung cancer, upper lobe ([place]) dx'd 12/2012   chemo and XRt complete  . 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 Pneumonia    years ago  . Seizures ([place])    Hx: of over 40 years ago  . Shortness of breath    Hx: of with exertion  . Status post radiation therapy 10/1999  . 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  MEDICATIONS:  Current Outpatient Medications  Medication Sig Dispense Refill  . albuterol (PROVENTIL HFA;VENTOLIN HFA) 108 (90 Base) MCG/ACT inhaler Inhale 2 puffs into the lungs every 6 (six) hours as needed for wheezing or shortness of breath.    Marland Kitchen albuterol (PROVENTIL) (2.5 MG/3ML) 0.083% nebulizer solution Take 3 mLs (2.5 mg total) every 4 (four) hours as needed by nebulization for wheezing or shortness of breath. 30 vial 0  . ALPRAZolam (XANAX) 0.5 MG tablet  Take 0.5 tablets (0.25 mg total) by mouth 2 (two) times daily as needed for anxiety. 90 tablet 0  . amoxicillin-clavulanate (AUGMENTIN) 875-125 MG tablet Take 1 tablet by mouth 2 (two) times daily.    Marland Kitchen aspirin EC 325 MG tablet Take 325 mg by mouth daily as needed for mild pain or moderate pain.    . Cholecalciferol (VITAMIN D3) 1000 units CAPS Take 1,000 Units by mouth daily.    . Glycopyrrolate-Formoterol (BEVESPI AEROSPHERE) 9-4.8 MCG/ACT AERO Inhale 2 Inhalers into  the lungs 2 (two) times daily. Inhale 2 puffs daily, rinse mouth after use. (Patient taking differently: Inhale 2 Inhalers into the lungs 2 (two) times daily. rinse mouth after use.) 4 Inhaler 0  . iron polysaccharides (NIFEREX) 150 MG capsule Take 150 mg daily by mouth.    . levothyroxine (SYNTHROID, LEVOTHROID) 125 MCG tablet Take 62.5-125 mcg by mouth See admin instructions. Take 125 mcg daily except Sundays take 62.5 mcg    . OXYGEN 2lpm with rest and 3lpm with exertion    . predniSONE (DELTASONE) 20 MG tablet Take 60 mg daily x 2 days then 40 mg daily x 2 days then 20 mg daily x 2 days (Patient not taking: Reported on 01/30/2017) 12 tablet 0  . predniSONE (DELTASONE) 20 MG tablet Take 1 tablet (20 mg total) by mouth daily with breakfast. 7 tablet 0  . RABEprazole (ACIPHEX) 20 MG tablet Take 30-60 min before first meal of the day 30 tablet 2  . terconazole (TERAZOL 3) 0.8 % vaginal cream Place 1 applicator vaginally daily. While on antibiotics    . UNABLE TO FIND Med Name: B-12 Injection once per month     No current facility-administered medications for this visit.     SURGICAL HISTORY:  Past Surgical History:  Procedure Laterality Date  . CHOLECYSTECTOMY N/A 08/03/2013   Procedure: LAPAROSCOPIC CHOLECYSTECTOMY;  Surgeon: Harl Bowie, MD;  Location: [facility];  Service: General;  Laterality: N/A;  . COLONOSCOPY W/ BIOPSIES AND POLYPECTOMY     Hx: of  . IR GENERIC HISTORICAL  04/11/2016   IR REMOVAL TUN ACCESS W/ PORT W/O FL MOD SED 04/11/2016 Arne Cleveland, MD [facility]-INTERV RAD  . KYPHOPLASTY N/A 12/06/2015   Procedure: THORACIC 5 KYPHOPLASTY;  Surgeon: Melina Schools, MD;  Location: [facility];  Service: Orthopedics;  Laterality: N/A;  . LASER ABLATION  04/2004   for VIN III  . PORTACATH PLACEMENT    . TUBAL LIGATION    . VIDEO BRONCHOSCOPY WITH ENDOBRONCHIAL NAVIGATION N/A 12/31/2012   Procedure: VIDEO BRONCHOSCOPY WITH ENDOBRONCHIAL NAVIGATION;  Surgeon: Melrose Nakayama, MD;  Location:  [facility];  Service: Thoracic;  Laterality: N/A;  . VIDEO BRONCHOSCOPY WITH ENDOBRONCHIAL ULTRASOUND N/A 12/31/2012   Procedure: VIDEO BRONCHOSCOPY WITH ENDOBRONCHIAL ULTRASOUND;  Surgeon: Melrose Nakayama, MD;  Location: [facility];  Service: Thoracic;  Laterality: N/A;    REVIEW OF SYSTEMS:  A comprehensive review of systems was negative except for: Respiratory: positive for cough and dyspnea on exertion   PHYSICAL EXAMINATION: General appearance: alert, cooperative, fatigued and no distress Head: Normocephalic, without obvious abnormality, atraumatic Neck: no adenopathy, no JVD, supple, symmetrical, trachea midline and thyroid not enlarged, symmetric, no tenderness/mass/nodules Lymph nodes: Cervical, supraclavicular, and axillary nodes normal. Resp: wheezes bilaterally Back: symmetric, no curvature. ROM normal. No CVA tenderness. Cardio: regular rate and rhythm, S1, S2 normal, no murmur, click, rub or gallop GI: soft, non-tender; bowel sounds normal; no masses,  no organomegaly Extremities: extremities normal, atraumatic, no cyanosis  or edema  ECOG PERFORMANCE STATUS: 1 - Symptomatic but completely ambulatory  Blood pressure (!) 145/74, pulse 66, temperature 97.9 F (36.6 C), temperature source Oral, resp. rate 18, weight 182 lb 11.2 oz (82.9 kg), SpO2 100 %.  LABORATORY DATA: Lab Results  Component Value Date   WBC 2.6 (L) 01/30/2017   HGB 12.0 01/30/2017   HCT 35.3 (L) 01/30/2017   MCV 93.8 01/30/2017   PLT 137.0 (L) 01/30/2017      Chemistry      Component Value Date/Time   NA 141 01/29/2017 0944   K 4.5 01/29/2017 0944   CL 102 01/12/2017 1509   CO2 27 01/29/2017 0944   BUN 8.1 01/29/2017 0944   CREATININE 0.8 01/29/2017 0944      Component Value Date/Time   CALCIUM 9.1 01/29/2017 0944   ALKPHOS 77 01/29/2017 0944   AST 18 01/29/2017 0944   ALT 14 01/29/2017 0944   BILITOT 1.06 01/29/2017 0944       RADIOGRAPHIC STUDIES: Dg Chest 2 View  Result Date:  01/12/2017 CLINICAL DATA:  71 y/o F; shortness of breath. History of lung cancer post radiation, COPD, bronchitis. EXAM: CHEST  2 VIEW COMPARISON:  06/15/2016 chest radiograph.  08/25/2016 CT chest. FINDINGS: Stable cardiomegaly. Stable volume loss in the right mid and upper lung zones with rightward displacement of mediastinum. Stable pleuroparenchymal scarring/fibrosis and the right mid and upper lung zones. No new focal consolidation. No pleural effusion or pneumothorax. Hyperinflated lungs with flattened diaphragms compatible with COPD. Stable T5 and T6 compression deformities. No new acute osseous abnormality identified. IMPRESSION: Stable cardiomegaly. Stable right mid and upper lung zone post radiation changes. Stable findings of COPD. No new acute pulmonary process identified. Electronically Signed   By: Kristine Garbe M.D.   On: 01/12/2017 16:13   Dg Thoracic Spine 2 View  Result Date: 01/12/2017 CLINICAL DATA:  History of lung cancer with radiation. Pain to the thoracic spine. EXAM: THORACIC SPINE 2 VIEWS COMPARISON:  08/25/2016 CT of the chest. FINDINGS: Stable T5 and T6 compression deformities post T5 kyphoplasty. No new loss of vertebral body height, malalignment, or acute osseous abnormality identified. IMPRESSION: Stable T5 and T6 compression deformities post T5 kyphoplasty. No new acute osseous abnormality identified. Electronically Signed   By: Kristine Garbe M.D.   On: 01/12/2017 16:15   Ct Angio Chest Pe W/cm &/or Wo Cm  Result Date: 01/12/2017 CLINICAL DATA:  Shortness of breath for several days EXAM: CT ANGIOGRAPHY CHEST WITH CONTRAST TECHNIQUE: Multidetector CT imaging of the chest was performed using the standard protocol during bolus administration of intravenous contrast. Multiplanar CT image reconstructions and MIPs were obtained to evaluate the vascular anatomy. CONTRAST:  116mL ISOVUE-370 IOPAMIDOL (ISOVUE-370) INJECTION 76% COMPARISON:  08/25/2016 FINDINGS:  Cardiovascular: Atherosclerotic calcifications of the thoracic aorta are noted. No aneurysmal dilatation or dissection is seen. Mild cardiomegaly is noted. Coronary calcifications are seen. The pulmonary artery shows a normal branching pattern without intraluminal filling defect to suggest pulmonary embolism. There remains compression of the right main pulmonary artery as well as a obliteration of the upper lobe arterial branch secondary to the prior radiation therapy. Mediastinum/Nodes: The thoracic inlet is within normal limits. Mild mediastinal shift to the right is noted related to the prior treatment and volume loss in the right lung. Stable right infrahilar lymph node is noted as well as some small subcarinal lymph nodes. There remains soft tissue density emanating from the right hilum superiorly compression upon the right pulmonary artery as  well as the tracheobronchial tree involving the right upper lobe. There appears to be some slight increase in the degree of soft tissue fullness in the right hilum best seen on image number 98 of series 7. This measures approximately 3.0 by 2.3 cm. The possibility of some recurrent disease could not be totally excluded on the basis of this exam. Lungs/Pleura: Emphysematous changes are noted. Volume loss on the right is noted consistent with the prior history. The degree of radiation fibrosis is relatively stable when compared with the prior exam. No new focal infiltrate or nodule is noted. Upper Abdomen: Visualized upper abdomen is within normal limits. Musculoskeletal: Degenerative changes of the thoracic spine are seen. No acute compression deformities noted. Stable changes of kyphoplasty and T5 with endplate deformity at T6 is noted. No new compression deformities are noted. Review of the MIP images confirms the above findings. IMPRESSION: No evidence of pulmonary emboli. The right upper lobe pulmonary arterial branch is again occluded secondary to the changes of  radiation fibrosis. Slight increase in the degree of soft tissue in the region of the right hilum and extending superiorly. The possibility of recurrent neoplasm could not be totally excluded. Stable appearing right hilar nodes and subcarinal nodes are seen. Repeat PET-CT may be helpful as clinically indicated. COPD. Chronic changes as described above. Aortic Atherosclerosis (ICD10-I70.0) and Emphysema (ICD10-J43.9). Electronically Signed   By: Inez Catalina M.D.   On: 01/12/2017 19:05   ASSESSMENT AND PLAN:  This is a very pleasant 71 years old white female with stage IIIa non-small cell lung cancer status post a course of concurrent chemoradiation followed by consolidation chemotherapy with carboplatin and paclitaxel and she has been on observation for more than 3 years with no clear evidence for disease recurrence. The patient had repeat CT scan of the chest performed recently.  The scan showed slight increase in the soft tissue in the right hilum again suspicious for recurrent neoplasm but she had a previous PET scan few months ago that showed no clear evidence for disease recurrence. I recommended for her to proceed with the bronchoscopy as recommended by Dr. Lake Bells. I will see her back for follow-up visit in 3 months for evaluation with repeat CT scan of the chest. The patient was advised to call immediately if she has any concerning symptoms in the interval. The patient voices understanding of current disease status and treatment options and is in agreement with the current care plan. All questions were answered. The patient knows to call the clinic with any problems, questions or concerns. We can certainly see the patient much sooner if necessary. I spent 10 minutes counseling the patient face to face. The total time spent in the appointment was 15 minutes.  Disclaimer: This note was dictated with voice recognition software. Similar sounding words can inadvertently be transcribed and may not be  corrected upon review.

## 2017-02-05 NOTE — Telephone Encounter (Signed)
Respiratory in the hospital closes at 4:30, so we will have to call 12/13 AM to reschedule this.  Will hold in message to ensure that this is followed up on.

## 2017-02-05 NOTE — Telephone Encounter (Signed)
BQ please advise when it would be best for your schedule to get this bronch rescheduled. Thanks.

## 2017-02-05 NOTE — Telephone Encounter (Signed)
Scheduled appt per 12/12 los- Gave patient AVS and calender per los. - Central radiology to contact patient with ct scan schedule.

## 2017-02-05 NOTE — Telephone Encounter (Signed)
Any time 12/13, 12/14, or 12/15 at [facility]

## 2017-02-06 ENCOUNTER — Telehealth: Payer: Self-pay | Admitting: Pulmonary Disease

## 2017-02-06 NOTE — Telephone Encounter (Signed)
Scheduled bronch for tomorrow at 9:00 at [facility]. Called to make pt aware, states she cannot do tomorrow and does not want to reschedule this before Christmas.  Called Baxter Flattery again at respiratory to cancel this bronch.  BQ please advise when you can do bronch after Christmas.  Thanks.

## 2017-02-06 NOTE — Telephone Encounter (Signed)
Spoke with Dawn Mercer at respiratory- states that they can do tomorrow at [facility] but not at [facility].    BQ please advise if you can do this tomorrow at [facility].  Thanks.

## 2017-02-06 NOTE — Telephone Encounter (Signed)
Called respiratory, bronch cannot be scheduled for today or tomorrow, 12/15 is a Saturday and cannot be scheduled for that day either.  BQ please advise if there is a day next week that works well for you.  Thanks!

## 2017-02-06 NOTE — Telephone Encounter (Signed)
Sure - that's fine

## 2017-02-06 NOTE — Telephone Encounter (Signed)
Duplicate encounter- see 12/7 phone note.

## 2017-02-06 NOTE — Telephone Encounter (Signed)
(  duplicate message opened- copied below)  Incoming call:  pt calling back about bronoscopy that she is scheduled for tomorrow. She has questions that she didn't ask due to being caught off guard for the scheduling so soon.  ------------ Pt had questions regarding bronch which have been answered.  Pt also said she would be willing to do bronch next week if BQ is available.  BQ please advise.  Thanks.

## 2017-02-06 NOTE — Telephone Encounter (Signed)
I can do it tomorrow or next week at [facility] first case (8AM).  I'm OK with changing if she needs to

## 2017-02-06 NOTE — Telephone Encounter (Signed)
I can do 12/14, the respiratory techs told me today that they can do it on 12/14

## 2017-02-07 NOTE — Telephone Encounter (Signed)
Pt returning Ashley's call from yesterday concerning rescheduling of her bronc.  [phone]

## 2017-02-07 NOTE — Telephone Encounter (Signed)
noted 

## 2017-02-07 NOTE — Telephone Encounter (Signed)
lmtcb X1 for pt- want to ensure that she can do early next week at [facility] before scheduling.

## 2017-02-07 NOTE — Telephone Encounter (Signed)
Spoke with pt, she states early morning next week is ok for the bronch.

## 2017-02-07 NOTE — Telephone Encounter (Addendum)
Spoke with Baxter Flattery and we scheduled bronch for pt on Friday 21st at 8am. Pt notified and advised to arrive at [facility] at 7:00am. FYI BQ

## 2017-02-14 ENCOUNTER — Ambulatory Visit (HOSPITAL_COMMUNITY): Payer: Medicare Other

## 2017-02-14 ENCOUNTER — Ambulatory Visit (HOSPITAL_COMMUNITY)
Admission: RE | Admit: 2017-02-14 | Discharge: 2017-02-14 | Disposition: A | Discharge status: Home / Self Care | Payer: Medicare Other | Source: Ambulatory Visit | Attending: Pulmonary Disease | Admitting: Pulmonary Disease

## 2017-02-14 ENCOUNTER — Encounter (HOSPITAL_COMMUNITY)
Admission: RE | Disposition: A | Discharge status: Home / Self Care | Payer: Self-pay | Source: Ambulatory Visit | Attending: Pulmonary Disease

## 2017-02-14 ENCOUNTER — Encounter (HOSPITAL_COMMUNITY): Payer: Self-pay | Admitting: Respiratory Therapy

## 2017-02-14 DIAGNOSIS — Z87891 Personal history of nicotine dependence: Secondary | ICD-10-CM | POA: Diagnosis not present

## 2017-02-14 DIAGNOSIS — K449 Diaphragmatic hernia without obstruction or gangrene: Secondary | ICD-10-CM | POA: Diagnosis not present

## 2017-02-14 DIAGNOSIS — K219 Gastro-esophageal reflux disease without esophagitis: Secondary | ICD-10-CM | POA: Diagnosis not present

## 2017-02-14 DIAGNOSIS — Z9889 Other specified postprocedural states: Secondary | ICD-10-CM

## 2017-02-14 DIAGNOSIS — G629 Polyneuropathy, unspecified: Secondary | ICD-10-CM | POA: Diagnosis not present

## 2017-02-14 DIAGNOSIS — Z8249 Family history of ischemic heart disease and other diseases of the circulatory system: Secondary | ICD-10-CM | POA: Insufficient documentation

## 2017-02-14 DIAGNOSIS — K589 Irritable bowel syndrome without diarrhea: Secondary | ICD-10-CM | POA: Insufficient documentation

## 2017-02-14 DIAGNOSIS — F40240 Claustrophobia: Secondary | ICD-10-CM | POA: Diagnosis not present

## 2017-02-14 DIAGNOSIS — E039 Hypothyroidism, unspecified: Secondary | ICD-10-CM | POA: Diagnosis not present

## 2017-02-14 DIAGNOSIS — Z8541 Personal history of malignant neoplasm of cervix uteri: Secondary | ICD-10-CM | POA: Insufficient documentation

## 2017-02-14 DIAGNOSIS — Z8049 Family history of malignant neoplasm of other genital organs: Secondary | ICD-10-CM | POA: Insufficient documentation

## 2017-02-14 DIAGNOSIS — C3411 Malignant neoplasm of upper lobe, right bronchus or lung: Secondary | ICD-10-CM | POA: Insufficient documentation

## 2017-02-14 DIAGNOSIS — J449 Chronic obstructive pulmonary disease, unspecified: Secondary | ICD-10-CM | POA: Diagnosis not present

## 2017-02-14 DIAGNOSIS — Z923 Personal history of irradiation: Secondary | ICD-10-CM | POA: Diagnosis not present

## 2017-02-14 DIAGNOSIS — C3401 Malignant neoplasm of right main bronchus: Secondary | ICD-10-CM

## 2017-02-14 DIAGNOSIS — R0602 Shortness of breath: Secondary | ICD-10-CM | POA: Insufficient documentation

## 2017-02-14 DIAGNOSIS — Z9221 Personal history of antineoplastic chemotherapy: Secondary | ICD-10-CM | POA: Insufficient documentation

## 2017-02-14 DIAGNOSIS — Z91040 Latex allergy status: Secondary | ICD-10-CM | POA: Insufficient documentation

## 2017-02-14 DIAGNOSIS — Z885 Allergy status to narcotic agent status: Secondary | ICD-10-CM | POA: Insufficient documentation

## 2017-02-14 DIAGNOSIS — F419 Anxiety disorder, unspecified: Secondary | ICD-10-CM | POA: Diagnosis not present

## 2017-02-14 DIAGNOSIS — Z801 Family history of malignant neoplasm of trachea, bronchus and lung: Secondary | ICD-10-CM | POA: Insufficient documentation

## 2017-02-14 HISTORY — PX: VIDEO BRONCHOSCOPY: SHX5072

## 2017-02-14 SURGERY — BRONCHOSCOPY, WITH FLUOROSCOPY
Anesthesia: Moderate Sedation | Laterality: Bilateral

## 2017-02-14 MED ORDER — BUTAMBEN-TETRACAINE-BENZOCAINE 2-2-14 % EX AERO: 1.0000 | INHALATION_SPRAY | Freq: Once | CUTANEOUS | Status: DC

## 2017-02-14 MED ORDER — SODIUM CHLORIDE 0.9 % IV SOLN
INTRAVENOUS | Status: DC
  Administered 2017-02-14: 08:00:00 via INTRAVENOUS

## 2017-02-14 MED ORDER — MIDAZOLAM HCL 10 MG/2ML IJ SOLN
INTRAMUSCULAR | Status: DC | PRN
  Administered 2017-02-14: 1 mg via INTRAVENOUS
  Administered 2017-02-14: 2 mg via INTRAVENOUS

## 2017-02-14 MED ORDER — LIDOCAINE HCL 2 % EX GEL
CUTANEOUS | Status: DC | PRN
  Administered 2017-02-14: 1

## 2017-02-14 MED ORDER — MIDAZOLAM HCL 5 MG/ML IJ SOLN
INTRAMUSCULAR | Status: AC
  Filled 2017-02-14: qty 2

## 2017-02-14 MED ORDER — FENTANYL CITRATE (PF) 100 MCG/2ML IJ SOLN
INTRAMUSCULAR | Status: AC
  Filled 2017-02-14: qty 4

## 2017-02-14 MED ORDER — FENTANYL CITRATE (PF) 100 MCG/2ML IJ SOLN
INTRAMUSCULAR | Status: DC | PRN
  Administered 2017-02-14: 50 ug via INTRAVENOUS
  Administered 2017-02-14: 25 ug via INTRAVENOUS

## 2017-02-14 MED ORDER — PHENYLEPHRINE HCL 0.25 % NA SOLN
NASAL | Status: DC | PRN
  Administered 2017-02-14: 2 via NASAL

## 2017-02-14 MED ORDER — LIDOCAINE HCL 1 % IJ SOLN
INTRAMUSCULAR | Status: DC | PRN
  Administered 2017-02-14: 6 mL via RESPIRATORY_TRACT

## 2017-02-14 MED ORDER — LIDOCAINE HCL 2 % EX GEL: 1.0000 "application " | Freq: Once | CUTANEOUS | Status: DC

## 2017-02-14 NOTE — Progress Notes (Signed)
Video Bronchoscopy done  Intervention Bronchial washing Intervention Bronchial brushing  Procedure tolerated well

## 2017-02-14 NOTE — Op Note (Signed)
[facility] Cardiopulmonary Patient Name: Dawn Mercer Procedure Date: 02/14/2017 MRN: [id number] Attending MD: Juanito Doom , MD Date of Birth: [birth year] CSN: [id number] Age: 71 Admit Type: Outpatient Ethnicity: Not Hispanic or Latino Procedure:            Bronchoscopy Indications:          Known lung cancer of the right upper lobe Providers:            Nathaneil Canary B. Creola Krotz, MD, Andre Lefort RRT,RCP,                        Ashley Mariner RRT,RCP Referring MD:          Medicines:            Midazolam 3 mg mg IV, Fentanyl 75 mcg IV Complications:        No immediate complications Estimated Blood Loss: Estimated blood loss: none. Procedure:      Pre-Anesthesia Assessment:      - A History and Physical has been performed. Patient meds and allergies       have been reviewed. The risks and benefits of the procedure and the       sedation options and risks were discussed with the patient. All       questions were answered and informed consent was obtained. Patient       identification and proposed procedure were verified prior to the       procedure by the physician and the technician in the procedure room.       Mental Status Examination: normal. Airway Examination: normal       oropharyngeal airway. Respiratory Examination: clear to auscultation. CV       Examination: normal. ASA Grade Assessment: II - A patient with mild       systemic disease. After reviewing the risks and benefits, the patient       was deemed in satisfactory condition to undergo the procedure. The       anesthesia plan was to use moderate sedation / analgesia (conscious       sedation). Immediately prior to administration of medications, the       patient was re-assessed for adequacy to receive sedatives. The heart       rate, respiratory rate, oxygen saturations, blood pressure, adequacy of       pulmonary ventilation, and response to care were monitored throughout       the procedure.  The physical status of the patient was re-assessed after       the procedure.      After obtaining informed consent, the bronchoscope was passed under       direct vision. Throughout the procedure, the patient's blood pressure,       pulse, and oxygen saturations were monitored continuously. the [id number]       [id number] Bronchoscope was introduced through the right nostril and       advanced to the tracheobronchial tree of both lungs. The procedure was       accomplished without difficulty. The patient tolerated the procedure       well. The total duration of the procedure was 13 minutes. Findings:      The nasopharynx/oropharynx appears normal. The larynx appears normal.       The vocal cords appear normal. The subglottic space is normal. The       trachea is of normal caliber. The carina is sharp.  The tracheobronchial       tree of the left lung was examined to at least the first subsegmental       level. Bronchial mucosa and anatomy in the left lung are normal; there       are no endobronchial lesions, and no secretions.      Right Lung Abnormalities: Erythema was found in the right upper lobe. An       area of acutely inflamed mucosa was found in the anterior segment of the       right upper lobe (B3). BAL was performed in the RUL anterior segment       (B3) of the lung and sent for cell count, bacterial culture, viral       smears & culture, and fungal & AFB analysis and routine cytology. 60 mL       of fluid were instilled. 22 mL were returned. The return was cloudy.       There were no mucoid plugs in the return fluid. Fluoroscopically guided       transbronchial brushings were obtained in the anterior segment of the       right upper lobe and sent for routine cytology. Four samples were       obtained.      The remainder of the tracheobronchial tree is normal. Impression:      - Known lung cancer of the right upper lobe      - The left lung was normal.      - Erythema was present  in the right upper lobe.      - Acute mucosal inflammation was visualized in the anterior segment of       the right upper lobe (B3).      - Bronchoalveolar lavage was performed.      - Fluoroscopically guided transbronchial brushings were obtained. Moderate Sedation:      Moderate (conscious) sedation was personally administered by the       endoscopist. The following parameters were monitored: oxygen saturation,       heart rate, blood pressure, and response to care. Total physician       intraservice time was 18 minutes. Recommendation:      - Await BAL, biopsy, culture and cytology results. Procedure Code(s):      --- Professional ---      [phone], Bronchoscopy, rigid or flexible, including fluoroscopic guidance,       when performed; with bronchial alveolar lavage      [phone], Bronchoscopy, rigid or flexible, including fluoroscopic guidance,       when performed; with brushing or protected brushings      [id number], Moderate sedation services provided by the same physician or       other qualified health care professional performing the diagnostic or       therapeutic service that the sedation supports, requiring the presence       of an independent trained observer to assist in the monitoring of the       patient's level of consciousness and physiological status; initial 15       minutes of intraservice time, patient age 62 years or older Diagnosis Code(s):      --- Professional ---      C34.11, Malignant neoplasm of upper lobe, right bronchus or lung      R09.89, Other specified symptoms and signs involving the circulatory and       respiratory systems      J18.9,  Pneumonia, unspecified organism CPT copyright 2016 American Medical Association. All rights reserved. The codes documented in this report are preliminary and upon coder review may  be revised to meet current compliance requirements. Norlene Campbell, MD Juanito Doom, MD 02/14/2017 8:30:07 AM This report has been  signed electronically. Number of Addenda: 0 Scope In: 0:08:67 AM Scope Out: 8:21:52 AM

## 2017-02-14 NOTE — H&P (Signed)
[facility]  HPI: 71 y/o female with COPD and lung cancer and a right upper lobe mass known to be consistent with NSCLC treated with chemo/radiation previously came to my clinic for the first time in 12/2016 complaining of worsening fatigue, dyspnea and weight loss.  A 12/2016 CT angiogram showed that the RUL mass had increased in size.    Past Medical History:  Diagnosis Date  . Anxiety   . Bronchitis    Hx: of  . 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 Chest congestion 04/11/2016  . Claustrophobia   . COPD (chronic obstructive pulmonary disease) ([place])   . GERD (gastroesophageal reflux disease)   . H/O hiatal hernia   . Hay fever    Hx: of  . History of radiation therapy 01/28/13-03/23/13   64 gray to right upper lobe  . Hypothyroidism   . IBS (irritable bowel syndrome)    Hx: of  . Lung cancer, upper lobe ([place]) dx'd 12/2012   chemo and XRt complete  . 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 Pneumonia    years ago  . Seizures ([place])    Hx: of over 40 years ago  . Shortness of breath    Hx: of with exertion  . Status post radiation therapy 10/1999  . 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 Lung cancer Brother   . Heart disease Father   . Cervical cancer Sister   . Heart disease Brother   . Cancer Brother        small cell lung cancer     Social History   Socioeconomic History  . Marital status: Married    Spouse name: Not on file  . Number of children: Not on file  . Years of education: Not on file  . Highest education level: Not on file  Social Needs  . Financial resource strain: Not on file  . Food insecurity - worry: Not on file  . Food insecurity - inability: Not on file  . Transportation needs - medical: Not on file  . Transportation needs - non-medical: Not on file  Occupational History  . Occupation: Retired  Tobacco Use  . Smoking status: Former Smoker     Packs/day: 1.00    Years: 15.00    Pack years: 15.00    Last attempt to quit: 02/25/1998    Years since quitting: 18.9  . Smokeless tobacco: Never Used  Substance and Sexual Activity  . Alcohol use: No    Alcohol/week: 0.0 oz  . Drug use: No  . Sexual activity: Not Currently  Other Topics Concern  . Not on file  Social History Narrative  . Not on file     Allergies  Allergen Reactions  . Latex Dermatitis  . Codeine Nausea Only     @encmedstart @ Vitals:   02/14/17 0735 02/14/17 0739  Temp: 98.6 F (37 C)   TempSrc: Oral Oral    Gen: chronically ill appearing HENT: OP clear, TM's clear, neck supple PULM: CTA B, normal percussion CV: RRR, no mgr, trace edema GI: BS+, soft, nontender Derm: no cyanosis or rash Psyche: normal mood and affect   CBC    Component Value Date/Time   WBC 2.6 (L) 01/30/2017 1140   RBC 3.77 (L) 01/30/2017 1140   HGB 12.0 01/30/2017 1140   HGB 11.9 01/29/2017 0944   HCT  35.3 (L) 01/30/2017 1140   HCT 36.2 01/29/2017 0944   PLT 137.0 (L) 01/30/2017 1140   PLT 113 (L) 01/29/2017 0944   MCV 93.8 01/30/2017 1140   MCV 94.3 01/29/2017 0944   MCH 31.0 01/29/2017 0944   MCH 30.7 01/12/2017 1509   MCHC 33.9 01/30/2017 1140   RDW 17.6 (H) 01/30/2017 1140   RDW 16.6 (H) 01/29/2017 0944   LYMPHSABS 1.2 01/30/2017 1140   LYMPHSABS 1.1 01/29/2017 0944   MONOABS 0.0 (L) 01/30/2017 1140   MONOABS 0.0 (L) 01/29/2017 0944   EOSABS 0.1 01/30/2017 1140   EOSABS 0.1 01/29/2017 0944   BASOSABS 0.0 01/30/2017 1140   BASOSABS 0.0 01/29/2017 0944   CT chest images reviewed again today: slightly larger right upper lobe mass, centrilobular emphyseam  Impression/Plan: 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  Roselie Awkward, MD [place] [facility] Pager: [phone] Cell: [phone] After 3pm or if no response, call  [phone]

## 2017-02-14 NOTE — Discharge Instructions (Signed)
Flexible Bronchoscopy, Care After These instructions give you information on caring for yourself after your procedure. Your doctor may also give you more specific instructions. Call your doctor if you have any problems or questions after your procedure. Follow these instructions at home:  Do not eat or drink anything for 2 hours after your procedure. If you try to eat or drink before the medicine wears off, food or drink could go into your lungs. You could also burn yourself.  After 2 hours have passed and when you can cough and gag normally, you may eat soft food and drink liquids slowly.  The day after the test, you may eat your normal diet.  You may do your normal activities.  Keep all doctor visits. Get help right away if:  You get more and more short of breath.  You get light-headed.  You feel like you are going to pass out (faint).  You have chest pain.  You have new problems that worry you.  You cough up more than a little blood.  You cough up more blood than before. This information is not intended to replace advice given to you by your health care provider. Make sure you discuss any questions you have with your health care provider. Document Released: 12/09/2008 Document Revised: 07/20/2015 Document Reviewed: 10/16/2012 Elsevier Interactive Patient Education  2017 [place] not eat or drink until after   10:30    am  today 02/14/17  Please call the office at [phone] if you have any questions or concerns

## 2017-02-15 LAB — ACID FAST SMEAR (AFB): ACID FAST SMEAR - AFSCU2: NEGATIVE

## 2017-02-16 ENCOUNTER — Encounter (HOSPITAL_COMMUNITY): Payer: Self-pay | Admitting: Pulmonary Disease

## 2017-02-16 LAB — CULTURE, RESPIRATORY W GRAM STAIN: Special Requests: NORMAL

## 2017-02-16 LAB — CULTURE, RESPIRATORY: CULTURE: NORMAL

## 2017-02-23 ENCOUNTER — Telehealth: Payer: Self-pay | Admitting: Pulmonary Disease

## 2017-02-23 NOTE — Telephone Encounter (Signed)
I spoke to [place] today to let her know that the brushings from her bronchoscopy showed malignant cells consistent with carcinoma.  She voiced understanding.  Will cc the oncology team to help coordinate follow up.  Roselie Awkward, MD [place] [facility] Pager: [phone] Cell: [phone] After 3pm or if no response, call [phone]

## 2017-02-24 ENCOUNTER — Telehealth: Payer: Self-pay | Admitting: *Deleted

## 2017-02-24 DIAGNOSIS — C3412 Malignant neoplasm of upper lobe, left bronchus or lung: Secondary | ICD-10-CM

## 2017-02-24 NOTE — Telephone Encounter (Signed)
Oncology Nurse Navigator Documentation  Oncology Nurse Navigator Flowsheets 02/24/2017  Navigator Location [facility]  Navigator Encounter Type Telephone/I followed up with Dr. Julien Nordmann regarding recent update on Dawn Mercer. She has tissue DX to comeback positive for cancer. Dr. Julien Nordmann states he would like to see her in a few weeks. I called her to schedule her. She verbalized understanding of appt time and place.   Telephone Outgoing Call  Patient Visit Type Follow-up  Treatment Phase Follow-up  Barriers/Navigation Needs Coordination of Care  Interventions Coordination of Care  Coordination of Care Appts  Acuity Level 2  Time Spent with Patient 30

## 2017-03-11 ENCOUNTER — Ambulatory Visit (INDEPENDENT_AMBULATORY_CARE_PROVIDER_SITE_OTHER): Payer: Medicare Other | Admitting: Pulmonary Disease

## 2017-03-11 ENCOUNTER — Encounter: Payer: Self-pay | Admitting: Pulmonary Disease

## 2017-03-11 ENCOUNTER — Other Ambulatory Visit (INDEPENDENT_AMBULATORY_CARE_PROVIDER_SITE_OTHER): Payer: Medicare Other

## 2017-03-11 VITALS — BP 128/68 | HR 102

## 2017-03-11 DIAGNOSIS — D649 Anemia, unspecified: Secondary | ICD-10-CM

## 2017-03-11 DIAGNOSIS — J984 Other disorders of lung: Secondary | ICD-10-CM

## 2017-03-11 DIAGNOSIS — J449 Chronic obstructive pulmonary disease, unspecified: Secondary | ICD-10-CM

## 2017-03-11 DIAGNOSIS — C3491 Malignant neoplasm of unspecified part of right bronchus or lung: Secondary | ICD-10-CM

## 2017-03-11 LAB — FERRITIN: Ferritin: 586.6 ng/mL — ABNORMAL HIGH (ref 10.0–291.0)

## 2017-03-11 MED ORDER — ALBUTEROL SULFATE (2.5 MG/3ML) 0.083% IN NEBU: 2.5000 mg | INHALATION_SOLUTION | Freq: Four times a day (QID) | RESPIRATORY_TRACT | 11 refills | Status: AC | PRN

## 2017-03-11 MED ORDER — IPRATROPIUM-ALBUTEROL 0.5-2.5 (3) MG/3ML IN SOLN: 3.0000 mL | RESPIRATORY_TRACT | 11 refills | Status: DC | PRN

## 2017-03-11 MED ORDER — GLYCOPYRROLATE-FORMOTEROL 9-4.8 MCG/ACT IN AERO: 2.0000 | INHALATION_SPRAY | Freq: Two times a day (BID) | RESPIRATORY_TRACT | 5 refills | Status: DC

## 2017-03-11 NOTE — Patient Instructions (Signed)
Non-small cell lung cancer of the right upper lobe: Continue follow-up with Dr. Earlie Server  COPD: Continue taking Bivespi as you are doing Use albuterol 2-3 times a day throughout the day for shortness of breath  Chronic respiratory failure with hypoxemia: Continue taking oxygen 2 L/min  Anemia: We will check your iron levels, you may be a candidate for IV iron  We will see you back in 6-8 weeks or sooner if needed

## 2017-03-11 NOTE — Progress Notes (Signed)
Subjective:   PATIENT ID: Dawn Mercer GENDER: female DOB: 05-13-1945, MRN: [id number]  Synopsis: Referred in Dec 2018 for COPD, history of lung cancer treated with chemotherapy and radiation in 2014.  Bronchoscopy performed in December 2018 showed evidence of recurrent non-small cell lung cancer on a BAL from the right upper lobe.  HPI  Chief Complaint  Patient presents with  . Follow-up    pt c/o sob with exertion, stable.     Dawn Mercer is going to see Dr. Earlie Server tomorrow morning.  She thinks that she may have to take chemotherapy again.  It is uncertain what she will need.  She plans to talk about this tomorrow.  She is willing to undergo another biopsy if necessary.    She has been using her oxygen regularly.    She has been feeling some pain from her port-a-cath, she thinks she has a scar from where the last one was located.    She continues to notes some GERD symptoms.  She says that Tums help it.   She is taking B12 shots that have helped with her energy levels.   She wants to consider taking prednisone daily because she says it helps her breathing so much.    Past Medical History:  Diagnosis Date  . Anxiety   . Bronchitis    Hx: of  . 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 Chest congestion 04/11/2016  . Claustrophobia   . COPD (chronic obstructive pulmonary disease) ([place])   . GERD (gastroesophageal reflux disease)   . H/O hiatal hernia   . Hay fever    Hx: of  . History of radiation therapy 01/28/13-03/23/13   64 gray to right upper lobe  . Hypothyroidism   . IBS (irritable bowel syndrome)    Hx: of  . Lung cancer, upper lobe ([place]) dx'd 12/2012   chemo and XRt complete  . 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 Pneumonia    years ago  . Seizures ([place])    Hx: of over 40 years ago  . Shortness of breath    Hx: of with exertion  . Status post radiation therapy 10/1999  . VIN III (vulvar intraepithelial neoplasia  III) 04/2004     Review of Systems  Constitutional: Positive for malaise/fatigue. Negative for chills, fever and weight loss.  HENT: Negative for congestion, nosebleeds, sinus pain and sore throat.   Eyes: Negative for photophobia, pain and discharge.  Respiratory: Positive for cough, sputum production and shortness of breath. Negative for hemoptysis and wheezing.   Cardiovascular: Negative for chest pain, palpitations, orthopnea and leg swelling.  Gastrointestinal: Negative for abdominal pain, constipation, diarrhea, nausea and vomiting.  Genitourinary: Negative for dysuria, frequency, hematuria and urgency.  Musculoskeletal: Negative for back pain, joint pain, myalgias and neck pain.  Skin: Negative for itching and rash.  Neurological: Negative for tingling, tremors, sensory change, speech change, focal weakness, seizures, weakness and headaches.  Psychiatric/Behavioral: Negative for memory loss, substance abuse and suicidal ideas. The patient is not nervous/anxious.       Objective:  Physical Exam   Vitals:   03/11/17 1528  BP: 128/68  Pulse: (!) 102  SpO2: 93%   2L [place]  Gen: chronically ill appearing HENT: OP clear, TM's clear, neck supple PULM: wheezing RUL B, normal percussion CV: RRR, no mgr, trace edema GI: BS+, soft, nontender Derm: no cyanosis or rash Psyche: normal mood and affect  CBC    Component Value Date/Time   WBC 2.6 (L) 01/30/2017 1140   RBC 3.77 (L) 01/30/2017 1140   HGB 12.0 01/30/2017 1140   HGB 11.9 01/29/2017 0944   HCT 35.3 (L) 01/30/2017 1140   HCT 36.2 01/29/2017 0944   PLT 137.0 (L) 01/30/2017 1140   PLT 113 (L) 01/29/2017 0944   MCV 93.8 01/30/2017 1140   MCV 94.3 01/29/2017 0944   MCH 31.0 01/29/2017 0944   MCH 30.7 01/12/2017 1509   MCHC 33.9 01/30/2017 1140   RDW 17.6 (H) 01/30/2017 1140   RDW 16.6 (H) 01/29/2017 0944   LYMPHSABS 1.2 01/30/2017 1140   LYMPHSABS 1.1 01/29/2017 0944   MONOABS 0.0 (L) 01/30/2017 1140   MONOABS  0.0 (L) 01/29/2017 0944   EOSABS 0.1 01/30/2017 1140   EOSABS 0.1 01/29/2017 0944   BASOSABS 0.0 01/30/2017 1140   BASOSABS 0.0 01/29/2017 0944     Chest imaging: 12/2016 CT angiogram> negative for PE, likely radiation fibrosis in RUL, moderate to severe emphysema, ground glass in peripheral/basal distribution lower lobes, I have personally reviewed these images in the right upper lobe mass has increased in size compared to prior studies.   Other imaging: May 2018 PET/CT showed increased uptake in the hilar mass which had increased in size somewhat with some pleural extension. 10/2016 Nuc stress test: There was no ST segment deviation noted during stress. The study is normal. No myocardial ischemia or scar. his is a low risk study. Nuclear stress EF: 56%.  PFT:  Labs:  Path: December 2018 bronchoscopy: Right upper lobe BAL cytology positive for non-small cell lung cancer, brushing negative.  Echo:  Heart Catheterization:   Records from the November 2018 ER visit for  COPD exacerbation reviewed    Assessment & Plan:   Anemia, unspecified type - Plan: Ferritin  COPD GOLD II with disporportionate decrease dlco   Mass in right upper lung lobe  Non-small cell cancer of right lung ([place])  Discussion: Unfortunately our bronchoscopy showed recurrent non-small cell lung cancer in the right upper lobe on bronchoalveolar lavage wash.  Interestingly the brush was negative, though this is not surprising because there was no visible endobronchial disease.  She needs to follow-up with oncology for this.  She has persistent shortness of breath and some wheezing, I have advised her to use albuterol more frequently.  She is requesting daily prednisone but I think this would be too problematic in terms of side effects.  I do wonder whether or not iron deficiency contributes to her symptoms, she may be a good candidate for IV iron considering her underlying malignancy.  It is very unlikely  that she is absorbing any of the oral iron that she is taking.  Plan: Non-small cell lung cancer of the right upper lobe: Continue follow-up with Dr. Earlie Server  COPD: Continue taking Bivespi as you are doing Use albuterol 2-3 times a day throughout the day for shortness of breath  Chronic respiratory failure with hypoxemia: Continue taking oxygen 2 L/min  Anemia: We will check your iron levels, you may be a candidate for IV iron  We will see you back in 6-8 weeks or sooner if needed    Current Outpatient Medications:  .  albuterol (PROVENTIL HFA;VENTOLIN HFA) 108 (90 Base) MCG/ACT inhaler, Inhale 2 puffs into the lungs every 6 (six) hours as needed for wheezing or shortness of breath., Disp: , Rfl:  .  ALPRAZolam (XANAX) 0.5 MG tablet, Take 0.5 tablets (0.25 mg total) by  mouth 2 (two) times daily as needed for anxiety., Disp: 90 tablet, Rfl: 0 .  aspirin EC 325 MG tablet, Take 325 mg by mouth daily as needed for mild pain or moderate pain., Disp: , Rfl:  .  Cholecalciferol (VITAMIN D3) 1000 units CAPS, Take 1,000 Units by mouth daily., Disp: , Rfl:  .  Glycopyrrolate-Formoterol (BEVESPI AEROSPHERE) 9-4.8 MCG/ACT AERO, Inhale 2 Inhalers into the lungs 2 (two) times daily. Inhale 2 puffs daily, rinse mouth after use., Disp: 1 Inhaler, Rfl: 5 .  iron polysaccharides (NIFEREX) 150 MG capsule, Take 150 mg daily by mouth., Disp: , Rfl:  .  levothyroxine (SYNTHROID, LEVOTHROID) 125 MCG tablet, Take 62.5-125 mcg by mouth See admin instructions. Take 125 mcg daily except Sundays take 62.5 mcg, Disp: , Rfl:  .  OXYGEN, 2lpm with rest and 3lpm with exertion, Disp: , Rfl:  .  RABEprazole (ACIPHEX) 20 MG tablet, Take 30-60 min before first meal of the day, Disp: 30 tablet, Rfl: 2 .  terconazole (TERAZOL 3) 0.8 % vaginal cream, Place 1 applicator vaginally daily. While on antibiotics, Disp: , Rfl:  .  UNABLE TO FIND, Med Name: B-12 Injection once per month, Disp: , Rfl:  .  albuterol (PROVENTIL)  (2.5 MG/3ML) 0.083% nebulizer solution, Take 3 mLs (2.5 mg total) by nebulization every 6 (six) hours as needed for wheezing or shortness of breath., Disp: 300 mL, Rfl: 11 .  ipratropium-albuterol (DUONEB) 0.5-2.5 (3) MG/3ML SOLN, Take 3 mLs by nebulization every 4 (four) hours as needed., Disp: 360 mL, Rfl: 11

## 2017-03-12 ENCOUNTER — Telehealth: Payer: Self-pay | Admitting: Internal Medicine

## 2017-03-12 ENCOUNTER — Inpatient Hospital Stay: Payer: Medicare Other | Attending: Internal Medicine | Admitting: Internal Medicine

## 2017-03-12 ENCOUNTER — Encounter: Payer: Self-pay | Admitting: Internal Medicine

## 2017-03-12 ENCOUNTER — Inpatient Hospital Stay: Payer: Medicare Other

## 2017-03-12 ENCOUNTER — Telehealth: Payer: Self-pay | Admitting: Pulmonary Disease

## 2017-03-12 ENCOUNTER — Encounter: Payer: Self-pay | Admitting: *Deleted

## 2017-03-12 VITALS — BP 116/84 | HR 108 | Temp 98.1°F | Resp 18 | Ht 65.5 in | Wt 181.5 lb

## 2017-03-12 DIAGNOSIS — J449 Chronic obstructive pulmonary disease, unspecified: Secondary | ICD-10-CM | POA: Insufficient documentation

## 2017-03-12 DIAGNOSIS — Z9221 Personal history of antineoplastic chemotherapy: Secondary | ICD-10-CM

## 2017-03-12 DIAGNOSIS — C3411 Malignant neoplasm of upper lobe, right bronchus or lung: Secondary | ICD-10-CM

## 2017-03-12 DIAGNOSIS — K449 Diaphragmatic hernia without obstruction or gangrene: Secondary | ICD-10-CM | POA: Insufficient documentation

## 2017-03-12 DIAGNOSIS — C3412 Malignant neoplasm of upper lobe, left bronchus or lung: Secondary | ICD-10-CM

## 2017-03-12 DIAGNOSIS — R590 Localized enlarged lymph nodes: Secondary | ICD-10-CM | POA: Diagnosis not present

## 2017-03-12 DIAGNOSIS — D709 Neutropenia, unspecified: Secondary | ICD-10-CM | POA: Insufficient documentation

## 2017-03-12 DIAGNOSIS — K219 Gastro-esophageal reflux disease without esophagitis: Secondary | ICD-10-CM | POA: Diagnosis not present

## 2017-03-12 DIAGNOSIS — R5382 Chronic fatigue, unspecified: Secondary | ICD-10-CM

## 2017-03-12 DIAGNOSIS — G629 Polyneuropathy, unspecified: Secondary | ICD-10-CM | POA: Insufficient documentation

## 2017-03-12 DIAGNOSIS — Z7982 Long term (current) use of aspirin: Secondary | ICD-10-CM | POA: Diagnosis not present

## 2017-03-12 DIAGNOSIS — Z79899 Other long term (current) drug therapy: Secondary | ICD-10-CM | POA: Insufficient documentation

## 2017-03-12 DIAGNOSIS — Z8541 Personal history of malignant neoplasm of cervix uteri: Secondary | ICD-10-CM | POA: Diagnosis not present

## 2017-03-12 DIAGNOSIS — D708 Other neutropenia: Secondary | ICD-10-CM

## 2017-03-12 DIAGNOSIS — F419 Anxiety disorder, unspecified: Secondary | ICD-10-CM | POA: Insufficient documentation

## 2017-03-12 DIAGNOSIS — E039 Hypothyroidism, unspecified: Secondary | ICD-10-CM | POA: Insufficient documentation

## 2017-03-12 DIAGNOSIS — K589 Irritable bowel syndrome without diarrhea: Secondary | ICD-10-CM | POA: Diagnosis not present

## 2017-03-12 LAB — COMPREHENSIVE METABOLIC PANEL
ALK PHOS: 88 U/L (ref 40–150)
ALT: 11 U/L (ref 0–55)
AST: 15 U/L (ref 5–34)
Albumin: 3.4 g/dL — ABNORMAL LOW (ref 3.5–5.0)
Anion gap: 10 (ref 3–11)
BUN: 14 mg/dL (ref 7–26)
CALCIUM: 8.9 mg/dL (ref 8.4–10.4)
CO2: 28 mmol/L (ref 22–29)
CREATININE: 0.85 mg/dL (ref 0.60–1.10)
Chloride: 104 mmol/L (ref 98–109)
Glucose, Bld: 109 mg/dL (ref 70–140)
Potassium: 3.7 mmol/L (ref 3.3–4.7)
Sodium: 142 mmol/L (ref 136–145)
Total Bilirubin: 1.2 mg/dL (ref 0.2–1.2)
Total Protein: 7 g/dL (ref 6.4–8.3)

## 2017-03-12 LAB — CBC WITH DIFFERENTIAL/PLATELET
BASOS PCT: 1 %
Basophils Absolute: 0 10*3/uL (ref 0.0–0.1)
EOS ABS: 0.1 10*3/uL (ref 0.0–0.5)
Eosinophils Relative: 5 %
HCT: 32.9 % — ABNORMAL LOW (ref 34.8–46.6)
HEMOGLOBIN: 11.1 g/dL — AB (ref 11.6–15.9)
Lymphocytes Relative: 50 %
Lymphs Abs: 0.7 10*3/uL — ABNORMAL LOW (ref 0.9–3.3)
MCH: 32.1 pg (ref 25.1–34.0)
MCHC: 33.7 g/dL (ref 31.5–36.0)
MCV: 95.3 fL (ref 79.5–101.0)
Monocytes Absolute: 0 10*3/uL — ABNORMAL LOW (ref 0.1–0.9)
Monocytes Relative: 1 %
NEUTROS PCT: 43 %
Neutro Abs: 0.6 10*3/uL — ABNORMAL LOW (ref 1.5–6.5)
PLATELETS: 107 10*3/uL — AB (ref 145–400)
RBC: 3.45 MIL/uL — AB (ref 3.70–5.45)
RDW: 17.6 % — AB (ref 11.2–16.1)
WBC: 1.4 10*3/uL — AB (ref 3.9–10.3)

## 2017-03-12 MED ORDER — TBO-FILGRASTIM 480 MCG/0.8ML ~~LOC~~ SOSY
480.0000 ug | PREFILLED_SYRINGE | Freq: Once | SUBCUTANEOUS | Status: AC
  Administered 2017-03-12: 480 ug via SUBCUTANEOUS

## 2017-03-12 MED ORDER — GLYCOPYRROLATE-FORMOTEROL 9-4.8 MCG/ACT IN AERO: 2.0000 | INHALATION_SPRAY | Freq: Two times a day (BID) | RESPIRATORY_TRACT | 5 refills | Status: AC

## 2017-03-12 NOTE — Progress Notes (Signed)
[facility] [phone]   Fax:([phone]) [phone]  OFFICE PROGRESS NOTE  Lawerance Cruel, MD [place] [address]  DIAGNOSIS: Recurrent non-small cell lung cancer initially diagnosed as stage IIIA (T1b., N2, M0) non-small cell lung cancer, squamous cell carcinoma presented with right upper lobe lung nodule in addition to mediastinal lymphadenopathy diagnosed in November of 2014.  PRIOR THERAPY:  1) Concurrent chemoradiation with weekly carboplatin for AUC of 2 and paclitaxel 45 mg/M2, status post 8 cycles, last dose was given 03/15/2013 with partial response. 2) Consolidation chemotherapy with carboplatin for AUC of 5 and paclitaxel 175 mg/M2 every 3 weeks with Neulasta support, status post 3 cycles. First dose 05/03/2013. Last dose was given 06/14/2013.  CURRENT THERAPY: Systemic chemotherapy with carboplatin for AUC of 5, paclitaxel 175 mg/M2 and Keytruda 200 mg IV every 3 weeks.  First dose March 19, 2017.  INTERVAL HISTORY: Dawn Mercer 72 y.o. female returns to the clinic today for follow-up visit accompanied by her husband.  The patient is feeling fine today with no specific complaint except for the baseline shortness of breath and mild cough.  She denied having any hemoptysis.  She denied having any chest pain. She has no weight loss or night sweats.  She denied having any nausea, vomiting, diarrhea or constipation.  She denied having any fever or chills.  She recently underwent repeat bronchoscopy under the care of Dr. Lake Bells and the final pathology was consistent with recurrent non-small cell lung cancer, squamous cell carcinoma.  She is here today for evaluation and discussion of her treatment options.  MEDICAL HISTORY: Past Medical History:  Diagnosis Date  . Anxiety   . Bronchitis    Hx: of  . 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 Chest congestion 04/11/2016  . Claustrophobia   . COPD  (chronic obstructive pulmonary disease) ([place])   . GERD (gastroesophageal reflux disease)   . H/O hiatal hernia   . Hay fever    Hx: of  . History of radiation therapy 01/28/13-03/23/13   63 gray to right upper lobe  . Hypothyroidism   . IBS (irritable bowel syndrome)    Hx: of  . Lung cancer, upper lobe ([place]) dx'd 12/2012   chemo and XRt complete  . 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 Pneumonia    years ago  . Seizures ([place])    Hx: of over 40 years ago  . Shortness of breath    Hx: of with exertion  . Status post radiation therapy 10/1999  . 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  MEDICATIONS:  Current Outpatient Medications  Medication Sig Dispense Refill  . albuterol (PROVENTIL HFA;VENTOLIN HFA) 108 (90 Base) MCG/ACT inhaler Inhale 2 puffs into the lungs every 6 (six) hours as needed for wheezing or shortness of breath.    Marland Kitchen albuterol (PROVENTIL) (2.5 MG/3ML) 0.083% nebulizer solution Take 3 mLs (2.5 mg total) by nebulization every 6 (six) hours as needed for wheezing or shortness of breath. 300 mL 11  . ALPRAZolam (XANAX) 0.5 MG tablet Take 0.5 tablets (0.25 mg total) by mouth 2 (two) times daily as needed for anxiety. 90 tablet 0  . aspirin EC 325 MG tablet Take 325 mg by mouth daily as needed for mild pain or moderate pain.    . Cholecalciferol (VITAMIN D3) 1000 units  CAPS Take 1,000 Units by mouth daily.    . Glycopyrrolate-Formoterol (BEVESPI AEROSPHERE) 9-4.8 MCG/ACT AERO Inhale 2 Inhalers into the lungs 2 (two) times daily. Inhale 2 puffs daily, rinse mouth after use. 1 Inhaler 5  . ipratropium-albuterol (DUONEB) 0.5-2.5 (3) MG/3ML SOLN Take 3 mLs by nebulization every 4 (four) hours as needed. 360 mL 11  . iron polysaccharides (NIFEREX) 150 MG capsule Take 150 mg daily by mouth.    . levothyroxine (SYNTHROID, LEVOTHROID) 125 MCG tablet Take 62.5-125 mcg by mouth See admin instructions. Take 125 mcg  daily except Sundays take 62.5 mcg    . OXYGEN 2lpm with rest and 3lpm with exertion    . RABEprazole (ACIPHEX) 20 MG tablet Take 30-60 min before first meal of the day 30 tablet 2  . terconazole (TERAZOL 3) 0.8 % vaginal cream Place 1 applicator vaginally daily. While on antibiotics    . UNABLE TO FIND Med Name: B-12 Injection once per month     No current facility-administered medications for this visit.     SURGICAL HISTORY:  Past Surgical History:  Procedure Laterality Date  . CHOLECYSTECTOMY N/A 08/03/2013   Procedure: LAPAROSCOPIC CHOLECYSTECTOMY;  Surgeon: Harl Bowie, MD;  Location: [facility];  Service: General;  Laterality: N/A;  . COLONOSCOPY W/ BIOPSIES AND POLYPECTOMY     Hx: of  . IR GENERIC HISTORICAL  04/11/2016   IR REMOVAL TUN ACCESS W/ PORT W/O FL MOD SED 04/11/2016 Arne Cleveland, MD [facility]INTER[facility]  . KYPHOPLASTY N/A 12/06/2015   Procedure: THORACIC 5 KYPHOPLASTY;  Surgeon: Melina Schools, MD;  Location: [facility];  Service: Orthopedics;  Laterality: N/A;  . LASER ABLATION  04/2004   for VIN III  . PORTACATH PLACEMENT    . TUBAL LIGATION    . VIDEO BRONCHOSCOPY Bilateral 02/14/2017   Procedure: VIDEO BRONCHOSCOPY WITH FLUORO;  Surgeon: Juanito Doom, MD;  Location: [facility];  Service: Cardiopulmonary;  Laterality: Bilateral;  . VIDEO BRONCHOSCOPY WITH ENDOBRONCHIAL NAVIGATION N/A 12/31/2012   Procedure: VIDEO BRONCHOSCOPY WITH ENDOBRONCHIAL NAVIGATION;  Surgeon: Melrose Nakayama, MD;  Location: [facility];  Service: Thoracic;  Laterality: N/A;  . VIDEO BRONCHOSCOPY WITH ENDOBRONCHIAL ULTRASOUND N/A 12/31/2012   Procedure: VIDEO BRONCHOSCOPY WITH ENDOBRONCHIAL ULTRASOUND;  Surgeon: Melrose Nakayama, MD;  Location: [facility];  Service: Thoracic;  Laterality: N/A;    REVIEW OF SYSTEMS:  Constitutional: positive for fatigue Eyes: negative Ears, nose, mouth, throat, and face: negative Respiratory: positive for cough and dyspnea on exertion Cardiovascular:  negative Gastrointestinal: negative Genitourinary:negative Integument/breast: negative Hematologic/lymphatic: negative Musculoskeletal:negative Neurological: negative Behavioral/Psych: negative Endocrine: negative Allergic/Immunologic: negative   PHYSICAL EXAMINATION: General appearance: alert, cooperative, fatigued and no distress Head: Normocephalic, without obvious abnormality, atraumatic Neck: no adenopathy, no JVD, supple, symmetrical, trachea midline and thyroid not enlarged, symmetric, no tenderness/mass/nodules Lymph nodes: Cervical, supraclavicular, and axillary nodes normal. Resp: wheezes bilaterally Back: symmetric, no curvature. ROM normal. No CVA tenderness. Cardio: regular rate and rhythm, S1, S2 normal, no murmur, click, rub or gallop GI: soft, non-tender; bowel sounds normal; no masses,  no organomegaly Extremities: extremities normal, atraumatic, no cyanosis or edema Neurologic: Alert and oriented X 3, normal strength and tone. Normal symmetric reflexes. Normal coordination and gait  ECOG PERFORMANCE STATUS: 1 - Symptomatic but completely ambulatory  Blood pressure 116/84, pulse (!) 108, temperature 98.1 F (36.7 C), temperature source Oral, resp. rate 18, height 5' 5.5" (1.664 m), weight 181 lb 8 oz (82.3 kg), SpO2 96 %.  LABORATORY DATA: Lab Results  Component  Value Date   WBC 1.4 (L) 03/12/2017   HGB 11.1 (L) 03/12/2017   HCT 32.9 (L) 03/12/2017   MCV 95.3 03/12/2017   PLT 107 (L) 03/12/2017      Chemistry      Component Value Date/Time   NA 142 03/12/2017 0917   NA 141 01/29/2017 0944   K 3.7 03/12/2017 0917   K 4.5 01/29/2017 0944   CL 104 03/12/2017 0917   CO2 28 03/12/2017 0917   CO2 27 01/29/2017 0944   BUN 14 03/12/2017 0917   BUN 8.1 01/29/2017 0944   CREATININE 0.85 03/12/2017 0917   CREATININE 0.8 01/29/2017 0944      Component Value Date/Time   CALCIUM 8.9 03/12/2017 0917   CALCIUM 9.1 01/29/2017 0944   ALKPHOS 88 03/12/2017 0917    ALKPHOS 77 01/29/2017 0944   AST 15 03/12/2017 0917   AST 18 01/29/2017 0944   ALT 11 03/12/2017 0917   ALT 14 01/29/2017 0944   BILITOT 1.2 03/12/2017 0917   BILITOT 1.06 01/29/2017 0944       RADIOGRAPHIC STUDIES: Dg C-arm Bronchoscopy  Result Date: 02/14/2017 C-ARM BRONCHOSCOPY: Fluoroscopy was utilized by the requesting physician.  No radiographic interpretation.   ASSESSMENT AND PLAN:  This is a very pleasant 72 years old white female with recurrent non-small cell lung cancer initially diagnosed as stage IIIA non-small cell lung cancer status post a course of concurrent chemoradiation followed by consolidation chemotherapy with carboplatin and paclitaxel and she has been on observation for close to 4 years. Her recent CT scan of the chest showed increasing soft tissue in the right hilum suspicious for disease recurrence.  The patient underwent repeat bronchoscopy under the care of Dr. Lake Bells and the final pathology was consistent with recurrent non-small cell lung cancer, squamous cell carcinoma. I had a lengthy discussion with the patient and her husband today about her current disease of stage, prognosis and treatment options.  I gave the patient the option of continuous observation and palliative care versus consideration of palliative systemic chemotherapy with carboplatin for AC of 5, paclitaxel 175 mg/M2 and Keytruda 200 mg IV every 3 weeks with Neulasta support. I discussed with the patient the adverse effect of this treatment including but not limited to alopecia, myelosuppression, nausea and vomiting, peripheral neuropathy, liver or renal dysfunction in addition to the immunotherapy adverse effects. Her absolute neutrophil count is low today, I will consider the patient for treatment with Granix 480 mcg subcutaneously today to improve her blood count before starting systemic chemotherapy next week. The patient is interested in proceeding with the treatment and she is expected to  start the first cycle of this treatment on March 19, 2017. I will see her back for follow-up visit in 4 weeks for evaluation before starting cycle #2. The patient was advised to call immediately if she has any concerning symptoms in the interval. The patient voices understanding of current disease status and treatment options and is in agreement with the current care plan. All questions were answered. The patient knows to call the clinic with any problems, questions or concerns. We can certainly see the patient much sooner if necessary.  Disclaimer: This note was dictated with voice recognition software. Similar sounding words can inadvertently be transcribed and may not be corrected upon review.

## 2017-03-12 NOTE — Progress Notes (Signed)
Oncology Nurse Navigator Documentation  Oncology Nurse Navigator Flowsheets 03/12/2017  Navigator Location [facility]  Navigator Encounter Type Clinic/MDC/I spoke with patient and husband today at clinic.  Unfortunately, Dawn Mercer has disease progression.  She will be started back on chemo with IO therapy added. I educated her on IO therapy and treatment plan. She verbalized understanding plan of care.   Patient Visit Type Follow-up  Treatment Phase Pre-Tx/Tx Discussion  Barriers/Navigation Needs Education  Education Other  Interventions Education  Education Method Verbal;Written  Acuity Level 2  Acuity Level 2 Educational needs  Time Spent with Patient 30

## 2017-03-12 NOTE — Telephone Encounter (Signed)
Scheduled appt per 1/16 los - Gave patient AVS and calender per los.

## 2017-03-12 NOTE — Patient Instructions (Signed)
Tbo-Filgrastim injection What is this medicine? TBO-FILGRASTIM (T B O fil GRA stim) is a granulocyte colony-stimulating factor that stimulates the growth of neutrophils, a type of white blood cell important in the body's fight against infection. It is used to reduce the incidence of fever and infection in patients with certain types of cancer who are receiving chemotherapy that affects the bone marrow. This medicine may be used for other purposes; ask your health care provider or pharmacist if you have questions. COMMON BRAND NAME(S): Granix What should I tell my health care provider before I take this medicine? They need to know if you have any of these conditions: -bone scan or tests planned -kidney disease -sickle cell anemia -an unusual or allergic reaction to tbo-filgrastim, filgrastim, pegfilgrastim, other medicines, foods, dyes, or preservatives -pregnant or trying to get pregnant -breast-feeding How should I use this medicine? This medicine is for injection under the skin. If you get this medicine at home, you will be taught how to prepare and give this medicine. Refer to the Instructions for Use that come with your medication packaging. Use exactly as directed. Take your medicine at regular intervals. Do not take your medicine more often than directed. It is important that you put your used needles and syringes in a special sharps container. Do not put them in a trash can. If you do not have a sharps container, call your pharmacist or healthcare provider to get one. Talk to your pediatrician regarding the use of this medicine in children. Special care may be needed. Overdosage: If you think you have taken too much of this medicine contact a poison control center or emergency room at once. NOTE: This medicine is only for you. Do not share this medicine with others. What if I miss a dose? It is important not to miss your dose. Call your doctor or health care professional if you miss a  dose. What may interact with this medicine? This medicine may interact with the following medications: -medicines that may cause a release of neutrophils, such as lithium This list may not describe all possible interactions. Give your health care provider a list of all the medicines, herbs, non-prescription drugs, or dietary supplements you use. Also tell them if you smoke, drink alcohol, or use illegal drugs. Some items may interact with your medicine. What should I watch for while using this medicine? You may need blood work done while you are taking this medicine. What side effects may I notice from receiving this medicine? Side effects that you should report to your doctor or health care professional as soon as possible: -allergic reactions like skin rash, itching or hives, swelling of the face, lips, or tongue -blood in the urine -dark urine -dizziness -fast heartbeat -feeling faint -shortness of breath or breathing problems -signs and symptoms of infection like fever or chills; cough; or sore throat -signs and symptoms of kidney injury like trouble passing urine or change in the amount of urine -stomach or side pain, or pain at the shoulder -sweating -swelling of the legs, ankles, or abdomen -tiredness Side effects that usually do not require medical attention (report to your doctor or health care professional if they continue or are bothersome): -bone pain -headache -muscle pain -vomiting This list may not describe all possible side effects. Call your doctor for medical advice about side effects. You may report side effects to FDA at 1-800-FDA-1088. Where should I keep my medicine? Keep out of the reach of children. Store in a refrigerator between   2 and 8 degrees C (36 and 46 degrees F). Keep in carton to protect from light. Throw away this medicine if it is left out of the refrigerator for more than 5 consecutive days. Throw away any unused medicine after the expiration  date. NOTE: This sheet is a summary. It may not cover all possible information. If you have questions about this medicine, talk to your doctor, pharmacist, or health care provider.  2018 Elsevier/Gold Standard (2015-04-03 19:07:04)  

## 2017-03-12 NOTE — Telephone Encounter (Signed)
Spoke with patient. She stated that [facility] in [place] never received the RX for the [place]. Apologized to patient and advised that I would send in a RX for her.   She verbalized understanding. Nothing else needed at time of call.

## 2017-03-14 ENCOUNTER — Telehealth: Payer: Self-pay | Admitting: Pulmonary Disease

## 2017-03-14 NOTE — Telephone Encounter (Signed)
PA request sent from [email or web address] Key: [facility] PA sent to plan, determination expected within 1-5 days.  Will route to myself to follow up on.

## 2017-03-17 NOTE — Telephone Encounter (Signed)
Checked covermymeds for an outcome of pt's PA on the [place].  Based on Covermymeds, pt's bevespi was on the list of covered meds per her insurance and at this time a PA was not required at this time.  Called pt's pharmacy and spoke with Tameka to check on the status of pt's Rx. Tameka stated to me that the pt picked up Rx 03/12/17. Nothing further needed at this current time.

## 2017-03-19 ENCOUNTER — Emergency Department (HOSPITAL_COMMUNITY): Payer: Medicare Other

## 2017-03-19 ENCOUNTER — Other Ambulatory Visit: Payer: Self-pay

## 2017-03-19 ENCOUNTER — Inpatient Hospital Stay (HOSPITAL_COMMUNITY)
Admission: EM | Admit: 2017-03-19 | Discharge: 2017-03-24 | DRG: 291 | Disposition: A | Discharge status: Home Health | Payer: Medicare Other | Attending: Internal Medicine | Admitting: Internal Medicine

## 2017-03-19 ENCOUNTER — Encounter (HOSPITAL_COMMUNITY): Payer: Self-pay | Admitting: Family Medicine

## 2017-03-19 ENCOUNTER — Telehealth: Payer: Self-pay | Admitting: *Deleted

## 2017-03-19 ENCOUNTER — Ambulatory Visit: Payer: Medicare Other

## 2017-03-19 ENCOUNTER — Other Ambulatory Visit: Payer: Medicare Other

## 2017-03-19 DIAGNOSIS — Z87891 Personal history of nicotine dependence: Secondary | ICD-10-CM

## 2017-03-19 DIAGNOSIS — J189 Pneumonia, unspecified organism: Secondary | ICD-10-CM | POA: Diagnosis present

## 2017-03-19 DIAGNOSIS — K219 Gastro-esophageal reflux disease without esophagitis: Secondary | ICD-10-CM | POA: Diagnosis present

## 2017-03-19 DIAGNOSIS — Z885 Allergy status to narcotic agent status: Secondary | ICD-10-CM

## 2017-03-19 DIAGNOSIS — E039 Hypothyroidism, unspecified: Secondary | ICD-10-CM | POA: Diagnosis present

## 2017-03-19 DIAGNOSIS — C3411 Malignant neoplasm of upper lobe, right bronchus or lung: Secondary | ICD-10-CM | POA: Diagnosis not present

## 2017-03-19 DIAGNOSIS — R0609 Other forms of dyspnea: Secondary | ICD-10-CM | POA: Diagnosis not present

## 2017-03-19 DIAGNOSIS — C3410 Malignant neoplasm of upper lobe, unspecified bronchus or lung: Secondary | ICD-10-CM | POA: Diagnosis present

## 2017-03-19 DIAGNOSIS — Z79899 Other long term (current) drug therapy: Secondary | ICD-10-CM

## 2017-03-19 DIAGNOSIS — J479 Bronchiectasis, uncomplicated: Secondary | ICD-10-CM

## 2017-03-19 DIAGNOSIS — I5031 Acute diastolic (congestive) heart failure: Secondary | ICD-10-CM | POA: Diagnosis present

## 2017-03-19 DIAGNOSIS — F40240 Claustrophobia: Secondary | ICD-10-CM | POA: Diagnosis present

## 2017-03-19 DIAGNOSIS — E038 Other specified hypothyroidism: Secondary | ICD-10-CM

## 2017-03-19 DIAGNOSIS — D61818 Other pancytopenia: Secondary | ICD-10-CM | POA: Diagnosis present

## 2017-03-19 DIAGNOSIS — E669 Obesity, unspecified: Secondary | ICD-10-CM | POA: Diagnosis present

## 2017-03-19 DIAGNOSIS — Z7982 Long term (current) use of aspirin: Secondary | ICD-10-CM

## 2017-03-19 DIAGNOSIS — I509 Heart failure, unspecified: Secondary | ICD-10-CM | POA: Diagnosis not present

## 2017-03-19 DIAGNOSIS — G629 Polyneuropathy, unspecified: Secondary | ICD-10-CM | POA: Diagnosis present

## 2017-03-19 DIAGNOSIS — J471 Bronchiectasis with (acute) exacerbation: Secondary | ICD-10-CM

## 2017-03-19 DIAGNOSIS — Z9221 Personal history of antineoplastic chemotherapy: Secondary | ICD-10-CM

## 2017-03-19 DIAGNOSIS — J440 Chronic obstructive pulmonary disease with acute lower respiratory infection: Secondary | ICD-10-CM | POA: Diagnosis present

## 2017-03-19 DIAGNOSIS — K589 Irritable bowel syndrome without diarrhea: Secondary | ICD-10-CM | POA: Diagnosis present

## 2017-03-19 DIAGNOSIS — Z6839 Body mass index (BMI) 39.0-39.9, adult: Secondary | ICD-10-CM | POA: Diagnosis not present

## 2017-03-19 DIAGNOSIS — Z91040 Latex allergy status: Secondary | ICD-10-CM

## 2017-03-19 DIAGNOSIS — J9621 Acute and chronic respiratory failure with hypoxia: Secondary | ICD-10-CM | POA: Diagnosis present

## 2017-03-19 DIAGNOSIS — I248 Other forms of acute ischemic heart disease: Secondary | ICD-10-CM | POA: Diagnosis present

## 2017-03-19 DIAGNOSIS — J441 Chronic obstructive pulmonary disease with (acute) exacerbation: Secondary | ICD-10-CM | POA: Diagnosis present

## 2017-03-19 DIAGNOSIS — Z923 Personal history of irradiation: Secondary | ICD-10-CM | POA: Diagnosis not present

## 2017-03-19 LAB — CBC WITH DIFFERENTIAL/PLATELET
Basophils Absolute: 0 10*3/uL (ref 0.0–0.1)
Basophils Relative: 1 %
EOS PCT: 6 %
Eosinophils Absolute: 0.1 10*3/uL (ref 0.0–0.7)
HEMATOCRIT: 30.4 % — AB (ref 36.0–46.0)
Hemoglobin: 10.4 g/dL — ABNORMAL LOW (ref 12.0–15.0)
LYMPHS ABS: 0.7 10*3/uL (ref 0.7–4.0)
Lymphocytes Relative: 49 %
MCH: 32.6 pg (ref 26.0–34.0)
MCHC: 34.2 g/dL (ref 30.0–36.0)
MCV: 95.3 fL (ref 78.0–100.0)
MONO ABS: 0 10*3/uL — AB (ref 0.1–1.0)
MONOS PCT: 3 %
NEUTROS ABS: 0.6 10*3/uL — AB (ref 1.7–7.7)
Neutrophils Relative %: 41 %
Platelets: 88 10*3/uL — ABNORMAL LOW (ref 150–400)
RBC: 3.19 MIL/uL — ABNORMAL LOW (ref 3.87–5.11)
RDW: 16.8 % — AB (ref 11.5–15.5)
WBC: 1.4 10*3/uL — CL (ref 4.0–10.5)

## 2017-03-19 LAB — TROPONIN I: TROPONIN I: 0.04 ng/mL — AB (ref <0.03)

## 2017-03-19 LAB — COMPREHENSIVE METABOLIC PANEL
ALT: 13 U/L — ABNORMAL LOW (ref 14–54)
ANION GAP: 11 (ref 5–15)
AST: 19 U/L (ref 15–41)
Albumin: 3.6 g/dL (ref 3.5–5.0)
Alkaline Phosphatase: 80 U/L (ref 38–126)
BILIRUBIN TOTAL: 0.9 mg/dL (ref 0.3–1.2)
BUN: 18 mg/dL (ref 6–20)
CO2: 27 mmol/L (ref 22–32)
Calcium: 8.9 mg/dL (ref 8.9–10.3)
Chloride: 102 mmol/L (ref 101–111)
Creatinine, Ser: 0.81 mg/dL (ref 0.44–1.00)
GFR calc Af Amer: >60 mL/min (ref >60)
Glucose, Bld: 110 mg/dL — ABNORMAL HIGH (ref 65–99)
POTASSIUM: 3.5 mmol/L (ref 3.5–5.1)
Sodium: 140 mmol/L (ref 135–145)
TOTAL PROTEIN: 7.5 g/dL (ref 6.5–8.1)

## 2017-03-19 LAB — BRAIN NATRIURETIC PEPTIDE: B NATRIURETIC PEPTIDE 5: 175.3 pg/mL — AB (ref 0.0–100.0)

## 2017-03-19 MED ORDER — ENOXAPARIN SODIUM 40 MG/0.4ML ~~LOC~~ SOLN
40.0000 mg | SUBCUTANEOUS | Status: DC
  Administered 2017-03-19 – 2017-03-21 (×3): 40 mg via SUBCUTANEOUS
  Filled 2017-03-19 (×3): qty 0.4

## 2017-03-19 MED ORDER — SODIUM CHLORIDE 0.9% FLUSH
3.0000 mL | Freq: Two times a day (BID) | INTRAVENOUS | Status: DC
  Administered 2017-03-19 – 2017-03-24 (×9): 3 mL via INTRAVENOUS

## 2017-03-19 MED ORDER — IPRATROPIUM-ALBUTEROL 0.5-2.5 (3) MG/3ML IN SOLN
RESPIRATORY_TRACT | Status: AC
  Filled 2017-03-19: qty 3

## 2017-03-19 MED ORDER — ACETAMINOPHEN 325 MG PO TABS
650.0000 mg | ORAL_TABLET | Freq: Four times a day (QID) | ORAL | Status: DC | PRN
  Administered 2017-03-19 – 2017-03-22 (×7): 650 mg via ORAL
  Filled 2017-03-19 (×7): qty 2

## 2017-03-19 MED ORDER — LEVOTHYROXINE SODIUM 125 MCG PO TABS
62.5000 ug | ORAL_TABLET | ORAL | Status: DC
  Filled 2017-03-19: qty 1

## 2017-03-19 MED ORDER — VITAMIN D 1000 UNITS PO TABS
1000.0000 [IU] | ORAL_TABLET | Freq: Every day | ORAL | Status: DC
  Administered 2017-03-20 – 2017-03-24 (×5): 1000 [IU] via ORAL
  Filled 2017-03-19 (×5): qty 1

## 2017-03-19 MED ORDER — LEVOTHYROXINE SODIUM 50 MCG PO TABS
62.5000 ug | ORAL_TABLET | ORAL | Status: DC
  Administered 2017-03-23: 08:00:00 62.5 ug via ORAL
  Filled 2017-03-19: qty 1

## 2017-03-19 MED ORDER — IPRATROPIUM-ALBUTEROL 0.5-2.5 (3) MG/3ML IN SOLN
3.0000 mL | Freq: Four times a day (QID) | RESPIRATORY_TRACT | Status: DC | PRN
  Administered 2017-03-19: 3 mL via RESPIRATORY_TRACT
  Filled 2017-03-19: qty 3

## 2017-03-19 MED ORDER — ALPRAZOLAM 0.25 MG PO TABS
0.2500 mg | ORAL_TABLET | Freq: Two times a day (BID) | ORAL | Status: DC | PRN
  Administered 2017-03-19 – 2017-03-23 (×9): 0.25 mg via ORAL
  Filled 2017-03-19 (×10): qty 1

## 2017-03-19 MED ORDER — ONDANSETRON HCL 4 MG PO TABS: 4.0000 mg | ORAL_TABLET | Freq: Four times a day (QID) | ORAL | Status: DC | PRN

## 2017-03-19 MED ORDER — FUROSEMIDE 10 MG/ML IJ SOLN
20.0000 mg | Freq: Every day | INTRAMUSCULAR | Status: DC
  Administered 2017-03-19 – 2017-03-21 (×3): 20 mg via INTRAVENOUS
  Filled 2017-03-19 (×3): qty 2

## 2017-03-19 MED ORDER — FLUTICASONE FUROATE-VILANTEROL 100-25 MCG/INH IN AEPB
1.0000 | INHALATION_SPRAY | Freq: Every day | RESPIRATORY_TRACT | Status: DC
  Administered 2017-03-20: 08:00:00 1 via RESPIRATORY_TRACT
  Filled 2017-03-19: qty 28

## 2017-03-19 MED ORDER — SODIUM CHLORIDE 0.9 % IV SOLN: 250.0000 mL | INTRAVENOUS | Status: DC | PRN

## 2017-03-19 MED ORDER — IOPAMIDOL (ISOVUE-370) INJECTION 76%
100.0000 mL | Freq: Once | INTRAVENOUS | Status: AC | PRN
  Administered 2017-03-19: 100 mL via INTRAVENOUS

## 2017-03-19 MED ORDER — LEVOTHYROXINE SODIUM 25 MCG PO TABS
125.0000 ug | ORAL_TABLET | ORAL | Status: DC
  Administered 2017-03-20 – 2017-03-24 (×4): 125 ug via ORAL
  Filled 2017-03-19 (×4): qty 1

## 2017-03-19 MED ORDER — IOPAMIDOL (ISOVUE-370) INJECTION 76%
INTRAVENOUS | Status: AC
Start: 2017-03-19 — End: 2017-03-20
  Filled 2017-03-19: qty 100

## 2017-03-19 MED ORDER — ONDANSETRON HCL 4 MG/2ML IJ SOLN: 4.0000 mg | Freq: Four times a day (QID) | INTRAMUSCULAR | Status: DC | PRN

## 2017-03-19 MED ORDER — ARFORMOTEROL TARTRATE 15 MCG/2ML IN NEBU
15.0000 ug | INHALATION_SOLUTION | Freq: Two times a day (BID) | RESPIRATORY_TRACT | Status: DC
Start: 2017-03-19 — End: 2017-03-20
  Administered 2017-03-19 – 2017-03-20 (×2): 15 ug via RESPIRATORY_TRACT
  Filled 2017-03-19 (×2): qty 2

## 2017-03-19 MED ORDER — POLYSACCHARIDE IRON COMPLEX 150 MG PO CAPS
150.0000 mg | ORAL_CAPSULE | Freq: Every day | ORAL | Status: DC
  Administered 2017-03-19 – 2017-03-24 (×6): 150 mg via ORAL
  Filled 2017-03-19 (×6): qty 1

## 2017-03-19 MED ORDER — PANTOPRAZOLE SODIUM 40 MG PO TBEC
40.0000 mg | DELAYED_RELEASE_TABLET | Freq: Every day | ORAL | Status: DC
  Administered 2017-03-20 – 2017-03-24 (×5): 40 mg via ORAL
  Filled 2017-03-19 (×5): qty 1

## 2017-03-19 MED ORDER — METHYLPREDNISOLONE SODIUM SUCC 40 MG IJ SOLR
40.0000 mg | Freq: Three times a day (TID) | INTRAMUSCULAR | Status: DC
  Administered 2017-03-19 – 2017-03-21 (×5): 40 mg via INTRAVENOUS
  Filled 2017-03-19 (×5): qty 1

## 2017-03-19 MED ORDER — ACETAMINOPHEN 650 MG RE SUPP: 650.0000 mg | Freq: Four times a day (QID) | RECTAL | Status: DC | PRN

## 2017-03-19 MED ORDER — SODIUM CHLORIDE 0.9% FLUSH: 3.0000 mL | INTRAVENOUS | Status: DC | PRN

## 2017-03-19 MED ORDER — DOXYCYCLINE HYCLATE 100 MG PO TABS
100.0000 mg | ORAL_TABLET | Freq: Two times a day (BID) | ORAL | Status: AC
  Administered 2017-03-19 – 2017-03-24 (×10): 100 mg via ORAL
  Filled 2017-03-19 (×10): qty 1

## 2017-03-19 NOTE — ED Notes (Signed)
ED TO INPATIENT HANDOFF REPORT  Name/Age/Gender Dawn Mercer 72 y.o. female  Code Status    Code Status Orders  (From admission, onward)        Start     Ordered   03/19/17 1840  Full code  Continuous     03/19/17 1842    Code Status History    Date Active Date Inactive Code Status Order ID Comments User Context   06/16/2016 02:25 06/17/2016 19:50 Full Code [id number]  Toy Baker, MD ED   12/06/2015 11:03 12/07/2015 16:51 Full Code [id number]  Melina Schools, MD Inpatient   08/03/2013 17:57 08/04/2013 14:22 Full Code [id number]  Coralie Keens, MD Inpatient      Home/SNF/Other Home  Chief Complaint SOB  Level of Care/Admitting Diagnosis ED Disposition    ED Disposition Condition Placer: [facility] [[id number]]  Level of Care: Telemetry [5]  Admit to tele based on following criteria: Acute CHF  Diagnosis: Acute on chronic respiratory failure with hypoxia [facility]) [[id number]]  Admitting Physician: Barton Dubois [[id number]]  Attending Physician: Barton Dubois [[id number]]  Estimated length of stay: past midnight tomorrow  Certification:: I certify this patient will need inpatient services for at least 2 midnights  PT Class (Do Not Modify): Inpatient [101]  PT Acc Code (Do Not Modify): Private [1]       Medical History Past Medical History:  Diagnosis Date  . Anxiety   . Bronchitis    Hx: of  . 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 Chest congestion 04/11/2016  . Claustrophobia   . COPD (chronic obstructive pulmonary disease) ([place])   . GERD (gastroesophageal reflux disease)   . H/O hiatal hernia   . Hay fever    Hx: of  . History of radiation therapy 01/28/13-03/23/13   63 gray to right upper lobe  . Hypothyroidism   . IBS (irritable bowel syndrome)    Hx: of  . Lung cancer, upper lobe ([place]) dx'd 12/2012   chemo and XRt complete  . 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 Pneumonia     years ago  . Seizures ([place])    Hx: of over 40 years ago  . Shortness of breath    Hx: of with exertion  . Status post radiation therapy 10/1999  . 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 Latex Dermatitis  . Codeine Nausea Only    IV Location/Drains/Wounds Patient Lines/Drains/Airways Status   Active Line/Drains/Airways    Name:   Placement date:   Placement time:   Site:   Days:   Peripheral IV 03/19/17 Right Antecubital   03/19/17    1217    Antecubital   less than 1   Peripheral IV 03/19/17 Right Antecubital   03/19/17    1219    Antecubital   less than 1   Incision 01/11/13 Chest Right;Upper   01/11/13    1240     1528   Incision (Closed) 08/03/13 Abdomen Other (Comment)   08/03/13    1050     1324   Incision (Closed) 12/06/15 Back Other (Comment)   12/06/15    0916     469   Incision - 4 Ports Abdomen 1: Umbilicus 2: Right;Medial 3: Right;Lateral 4: Mid;Upper   08/03/13    --     1324  Labs/Imaging Results for orders placed or performed during the hospital encounter of 03/19/17 (from the past 48 hour(s))  CBC with Differential/Platelet     Status: Abnormal   Collection Time: 03/19/17 12:13 PM  Result Value Ref Range   WBC 1.4 (LL) 4.0 - 10.5 K/uL    Comment: REPEATED TO VERIFY CRITICAL RESULT CALLED TO, READ BACK BY AND VERIFIED WITH: SMITH,T. RN '@1320'$  ON 1.23.19 BY NMCCOY    RBC 3.19 (L) 3.87 - 5.11 MIL/uL   Hemoglobin 10.4 (L) 12.0 - 15.0 g/dL   HCT 30.4 (L) 36.0 - 46.0 %   MCV 95.3 78.0 - 100.0 fL   MCH 32.6 26.0 - 34.0 pg   MCHC 34.2 30.0 - 36.0 g/dL   RDW 16.8 (H) 11.5 - 15.5 %   Platelets 88 (L) 150 - 400 K/uL    Comment: REPEATED TO VERIFY SPECIMEN CHECKED FOR CLOTS PLATELET COUNT CONFIRMED BY SMEAR    Neutrophils Relative % 41 %   Lymphocytes Relative 49 %   Monocytes Relative 3 %   Eosinophils Relative 6 %   Basophils Relative 1 %   Neutro Abs 0.6 (L) 1.7 - 7.7 K/uL   Lymphs  Abs 0.7 0.7 - 4.0 K/uL   Monocytes Absolute 0.0 (L) 0.1 - 1.0 K/uL   Eosinophils Absolute 0.1 0.0 - 0.7 K/uL   Basophils Absolute 0.0 0.0 - 0.1 K/uL   WBC Morphology WHITE COUNT CONFIRMED ON SMEAR   Comprehensive metabolic panel     Status: Abnormal   Collection Time: 03/19/17 12:13 PM  Result Value Ref Range   Sodium 140 135 - 145 mmol/L   Potassium 3.5 3.5 - 5.1 mmol/L   Chloride 102 101 - 111 mmol/L   CO2 27 22 - 32 mmol/L   Glucose, Bld 110 (H) 65 - 99 mg/dL   BUN 18 6 - 20 mg/dL   Creatinine, Ser 0.81 0.44 - 1.00 mg/dL   Calcium 8.9 8.9 - 10.3 mg/dL   Total Protein 7.5 6.5 - 8.1 g/dL   Albumin 3.6 3.5 - 5.0 g/dL   AST 19 15 - 41 U/L   ALT 13 (L) 14 - 54 U/L   Alkaline Phosphatase 80 38 - 126 U/L   Total Bilirubin 0.9 0.3 - 1.2 mg/dL   GFR calc non Af Amer >60 >60 mL/min   GFR calc Af Amer >60 >60 mL/min    Comment: (NOTE) The eGFR has been calculated using the CKD EPI equation. This calculation has not been validated in all clinical situations. eGFR's persistently <60 mL/min signify possible Chronic Kidney Disease.    Anion gap 11 5 - 15  Brain natriuretic peptide     Status: Abnormal   Collection Time: 03/19/17 12:13 PM  Result Value Ref Range   B Natriuretic Peptide 175.3 (H) 0.0 - 100.0 pg/mL  Troponin I     Status: Abnormal   Collection Time: 03/19/17 12:13 PM  Result Value Ref Range   Troponin I 0.04 (HH) <0.03 ng/mL    Comment: CRITICAL RESULT CALLED TO, READ BACK BY AND VERIFIED WITH: T.SMITH RN 1319 [id number]    Ct Angio Chest Pe W And/or Wo Contrast  Result Date: 03/19/2017 CLINICAL DATA:  Dyspnea worsening since 10:15 a.m. patient has a known lung mass in the right upper lobe and was supposed to start chemotherapy today. EXAM: CT ANGIOGRAPHY CHEST WITH CONTRAST TECHNIQUE: Multidetector CT imaging of the chest was performed using the standard protocol during bolus administration of intravenous contrast. Multiplanar  CT image reconstructions and MIPs  were obtained to evaluate the vascular anatomy. CONTRAST:  152m ISOVUE-370 IOPAMIDOL (ISOVUE-370) INJECTION 76% COMPARISON:  01/12/2017 FINDINGS: Cardiovascular: Normal 3 vessel branch pattern of the great vessels with atherosclerosis noted proximally. Aortic atherosclerosis is also noted without aneurysm or dissection. Good opacification of the pulmonary arteries. Mild dilatation of the main pulmonary artery to 3.2 cm compatible with changes of chronic pulmonary hypertension. No evidence of acute pulmonary embolus. Mild cardiomegaly without pericardial effusion. Coronary arteriosclerosis. Slight dextroversion of the heart due to volume loss on the right. There remains slight compression of the main right pulmonary artery secondary to post radiative change as well as obliteration of the right upper lobe pulmonary artery. Mediastinum/Nodes: No adenopathy at the base of the neck. No supraclavicular nor axillary lymphadenopathy. No thyromegaly or mass. There remains soft tissue density emanating from the right hilum superiorly impressing upon the adjacent right pulmonary artery and tracheobronchial tree. This measures approximately 3.2 x 2.4 cm versus 3 x 2.3 cm previously and is without significant change. Stable right infrahilar and small subcarinal lymph nodes as before. The largest is subcarinal approximately 1.2 cm short axis. Lungs/Pleura: Stable centrilobular and paraseptal emphysematous change with volume loss on the right consistent with post treatment change and radiation fibrosis. Superimposed on emphysema are faint diffuse ground-glass opacities within both lungs that may represent a component of alveolitis or pneumonitis versus stigmata of mild pulmonary edema. No new focal infiltrate nor pulmonary nodules. Upper Abdomen: The included upper abdomen is nonacute. Musculoskeletal: Degenerative changes of the thoracic spine without acute compression deformity. Stable kyphoplasty at T5 with chronic stable  superior endplate compression of T6. No aggressive osteolytic or blastic lesions. Review of the MIP images confirms the above findings. IMPRESSION: 1. No acute pulmonary embolus. Redemonstration of occluded right upper lobe pulmonary arterial branch likely secondary to post treatment change and fibrosis. 2. Stable masslike opacity about the right hilum measuring approximately 3.2 x 2.4 cm versus 3 x 2.3 cm previously, slightly impressing upon the adjacent right pulmonary artery and tracheobronchial tree. 3. Stable small right infrahilar and subcarinal lymph nodes. 4. Faint ground-glass opacities of the lungs superimposed on emphysema, nonspecific but may represent stigmata of mild pulmonary edema, alveolitis or pneumonitis, or an atypical infection among some possibilities. 5. Coronary arteriosclerosis. 6. T5 kyphoplasty with chronic superior endplate compression of T6. No suspicious osseous lesions. Aortic Atherosclerosis (ICD10-I70.0) and Emphysema (ICD10-J43.9). Electronically Signed   By: DAshley RoyaltyM.D.   On: 03/19/2017 15:04   Dg Chest Port 1 View  Result Date: 03/19/2017 CLINICAL DATA:  History lung cancer.  Decreased oxygen saturation EXAM: PORTABLE CHEST 1 VIEW COMPARISON:  CT chest 01/12/2017 FINDINGS: Stable right perihilar and upper lobe posttreatment changes. Right lung volume loss. Underlying COPD. No pleural effusion or pneumothorax. No focal consolidation. Stable cardiomediastinal silhouette. No acute osseous abnormality. Prior upper-mid thoracic vertebral body augmentation. IMPRESSION: No acute cardiopulmonary disease. Stable right perihilar and upper lobe posttreatment changes. Electronically Signed   By: HKathreen Devoid  On: 03/19/2017 12:42    Pending Labs Unresulted Labs (From admission, onward)   Start     Ordered   03/26/17 0500  Creatinine, serum  (enoxaparin (LOVENOX)    CrCl >/= 30 ml/min)  Weekly,   R    Comments:  while on enoxaparin therapy    03/19/17 1842   03/20/17 06222  Basic metabolic panel  Daily,   R     03/19/17 1854   03/19/17 1839  CBC  (enoxaparin (LOVENOX)    CrCl >/= 30 ml/min)  Once,   R    Comments:  Baseline for enoxaparin therapy IF NOT ALREADY DRAWN.  Notify MD if PLT < 100 K.    03/19/17 1842   03/19/17 1839  Creatinine, serum  (enoxaparin (LOVENOX)    CrCl >/= 30 ml/min)  Once,   R    Comments:  Baseline for enoxaparin therapy IF NOT ALREADY DRAWN.    03/19/17 1842      Vitals/Pain Today's Vitals   03/19/17 1630 03/19/17 1800 03/19/17 1830 03/19/17 1900  BP: 123/64 116/60 131/67 (!) 110/94  Pulse:    (!) 110  Resp: 20 (!) 21 18   Temp:      TempSrc:      SpO2:  97% 94%   Weight:      Height:      PainSc:        Isolation Precautions No active isolations  Medications Medications  iopamidol (ISOVUE-370) 76 % injection (not administered)  ipratropium-albuterol (DUONEB) 0.5-2.5 (3) MG/3ML nebulizer solution 3 mL (not administered)  ALPRAZolam (XANAX) tablet 0.25 mg (not administered)  Vitamin D3 CAPS 1,000 Units (not administered)  iron polysaccharides (NIFEREX) capsule 150 mg (not administered)  levothyroxine (SYNTHROID, LEVOTHROID) tablet 62.5-125 mcg (not administered)  pantoprazole (PROTONIX) EC tablet 40 mg (not administered)  fluticasone furoate-vilanterol (BREO ELLIPTA) 100-25 MCG/INH 1 puff (not administered)  arformoterol (BROVANA) nebulizer solution 15 mcg (not administered)  methylPREDNISolone sodium succinate (SOLU-MEDROL) 40 mg/mL injection 40 mg (not administered)  doxycycline (VIBRA-TABS) tablet 100 mg (not administered)  furosemide (LASIX) injection 20 mg (not administered)  enoxaparin (LOVENOX) injection 40 mg (not administered)  acetaminophen (TYLENOL) tablet 650 mg (not administered)    Or  acetaminophen (TYLENOL) suppository 650 mg (not administered)  ondansetron (ZOFRAN) tablet 4 mg (not administered)    Or  ondansetron (ZOFRAN) injection 4 mg (not administered)  sodium chloride flush (NS) 0.9 %  injection 3 mL (not administered)  sodium chloride flush (NS) 0.9 % injection 3 mL (not administered)  0.9 %  sodium chloride infusion (not administered)  iopamidol (ISOVUE-370) 76 % injection 100 mL (100 mLs Intravenous Contrast Given 03/19/17 1431)    Mobility Unsure at this time.

## 2017-03-19 NOTE — ED Notes (Signed)
Date and time results received: 03/19/17 1320  (use smartphrase ".now" to insert current time)  Test: Troponin & WBC Critical Value: 0.04 & 1.4 Respectively  Name of Provider Notified: Dr. Lita Mains  Orders Received? Or Actions Taken?:

## 2017-03-19 NOTE — ED Notes (Signed)
When patient arrived writer placed pt on the monitor and pts o2 was reading 57% and she was on 3L of her own oxygen. Writer alerted RN in triage and we then placed pt on 15L non rebreather.

## 2017-03-19 NOTE — Telephone Encounter (Signed)
"  I have an appointment today.  My air level is down to 81 %.  I do not think I can come in.  Wearing oxygen at 3L nasally.  Have been wearing 3L for two hours now.  Can hardly walk."   Denies fever or other symptoms.  Advised to call 911 for transport to nearest ED.  Message left for collaborative with call information.Marland Kitchen

## 2017-03-19 NOTE — ED Triage Notes (Signed)
Patient is complaining of shortness of breath that got worse than normal around 10:15. She normally wears oxygen at 2L [place] but this morning went up to 3L [place]. Patient spoke with a call in line for the cancer center concerning her shortness of breath and increased in oxygen use. She was advised to call 911 but she came to [facility] instead by POV. Patient has a lung mass in her RUL and was suppose to start chemotherapy today. Patient is mild distress and gets more anxious when she feels she can not breath. Patient is currently 95%  [place] @ 3L.

## 2017-03-19 NOTE — ED Notes (Signed)
Bed: JS28 Expected date:  Expected time:  Means of arrival:  Comments: Sharlet Salina

## 2017-03-19 NOTE — ED Provider Notes (Signed)
[facility] DEPT Provider Note   CSN: [id number] Arrival date & time: 03/19/17  1133     History   Chief Complaint Chief Complaint  Patient presents with  . Shortness of Breath    HPI Dawn Mercer is a 72 y.o. female.  HPI Patient with history of COPD and lung cancer on 2 L of oxygen chronically presents with acute worsening of her shortness of breath this morning around 10:15 AM.  States she was trying to put on her pants and became dyspneic with tightness across her chest.  Also endorses generalized weakness.  She had minimal cough.  Denies any new lower extremity swelling or pain.  No fever or chills.  Said her that her oxygen saturations dropped into the 50s and came up into the 80s when she increase her oxygen to 3 L. Past Medical History:  Diagnosis Date  . Anxiety   . Bronchitis    Hx: of  . 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 Chest congestion 04/11/2016  . Claustrophobia   . COPD (chronic obstructive pulmonary disease) ([place])   . GERD (gastroesophageal reflux disease)   . H/O hiatal hernia   . Hay fever    Hx: of  . History of radiation therapy 01/28/13-03/23/13   63 gray to right upper lobe  . Hypothyroidism   . IBS (irritable bowel syndrome)    Hx: of  . Lung cancer, upper lobe ([place]) dx'd 12/2012   chemo and XRt complete  . 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 Pneumonia    years ago  . Seizures ([place])    Hx: of over 40 years ago  . Shortness of breath    Hx: of with exertion  . Status post radiation therapy 10/1999  . 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 Neutropenia ([place]) 03/12/2017  . 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 Obesity (BMI 30-39.9) 08/27/2016  . Gastroesophageal reflux disease / clinical dx with overt HB    . Acute on chronic respiratory failure with hypoxia ([place]) 06/16/2016  .  COPD with acute exacerbation ([place]) 06/16/2016  . COPD exacerbation ([facility]) 06/16/2016  . Chest pain 06/16/2016  . Hypothyroidism 06/16/2016  . Compression fracture of body of thoracic vertebra ([place]) 12/06/2015  . Compression fracture of thoracic vertebra ([facility]) 11/20/2015  . Back pain, thoracic 11/20/2015  . COPD GOLD II with disporportionate decrease dlco  05/31/2015  . Chronic respiratory failure with hypoxia ([place]) 05/31/2015  . Dyspnea 05/31/2015  . Symptomatic cholelithiasis 07/23/2013  . History of cervical cancer 04/26/2013  . Malignant neoplasm of upper lobe of lung ([place]) 01/14/2013    Past Surgical History:  Procedure Laterality Date  . CHOLECYSTECTOMY N/A 08/03/2013   Procedure: LAPAROSCOPIC CHOLECYSTECTOMY;  Surgeon: Harl Bowie, MD;  Location: [facility];  Service: General;  Laterality: N/A;  . COLONOSCOPY W/ BIOPSIES AND POLYPECTOMY     Hx: of  . IR GENERIC HISTORICAL  04/11/2016   IR REMOVAL TUN ACCESS W/ PORT W/O FL MOD SED 04/11/2016 Arne Cleveland, MD [facility]-INTERV RAD  . KYPHOPLASTY N/A 12/06/2015   Procedure: THORACIC 5 KYPHOPLASTY;  Surgeon: Melina Schools, MD;  Location: [facility];  Service: Orthopedics;  Laterality: N/A;  . LASER ABLATION  04/2004   for VIN III  . PORTACATH PLACEMENT    . TUBAL LIGATION    .  VIDEO BRONCHOSCOPY Bilateral 02/14/2017   Procedure: VIDEO BRONCHOSCOPY WITH FLUORO;  Surgeon: Juanito Doom, MD;  Location: [facility];  Service: Cardiopulmonary;  Laterality: Bilateral;  . VIDEO BRONCHOSCOPY WITH ENDOBRONCHIAL NAVIGATION N/A 12/31/2012   Procedure: VIDEO BRONCHOSCOPY WITH ENDOBRONCHIAL NAVIGATION;  Surgeon: Melrose Nakayama, MD;  Location: [facility];  Service: Thoracic;  Laterality: N/A;  . VIDEO BRONCHOSCOPY WITH ENDOBRONCHIAL ULTRASOUND N/A 12/31/2012   Procedure: VIDEO BRONCHOSCOPY WITH ENDOBRONCHIAL ULTRASOUND;  Surgeon: Melrose Nakayama, MD;  Location: [facility];  Service: Thoracic;  Laterality: N/A;    OB History    No data available        Home Medications    Prior to Admission medications   Medication Sig Start Date End Date Taking? Authorizing Provider  albuterol (PROVENTIL HFA;VENTOLIN HFA) 108 (90 Base) MCG/ACT inhaler Inhale 2 puffs into the lungs every 6 (six) hours as needed for wheezing or shortness of breath.   Yes [provider]  albuterol (PROVENTIL) (2.5 MG/3ML) 0.083% nebulizer solution Take 3 mLs (2.5 mg total) by nebulization every 6 (six) hours as needed for wheezing or shortness of breath. 03/11/17  Yes Juanito Doom, MD  ALPRAZolam [name]) 0.5 MG tablet Take 0.5 tablets (0.25 mg total) by mouth 2 (two) times daily as needed for anxiety. 01/11/16  Yes Gery Pray, MD  aspirin EC 325 MG tablet Take 325 mg by mouth daily as needed for mild pain or moderate pain.   Yes [provider]  Cholecalciferol (VITAMIN D3) 1000 units CAPS Take 1,000 Units by mouth daily.   Yes [provider]  cyanocobalamin (,VITAMIN B-12,) 1000 MCG/ML injection Inject 1,000 mcg into the muscle every 30 (thirty) days.   Yes [provider]  Glycopyrrolate-Formoterol (BEVESPI AEROSPHERE) 9-4.8 MCG/ACT AERO Inhale 2 Inhalers into the lungs 2 (two) times daily. Inhale 2 puffs daily, rinse mouth after use. 03/12/17  Yes Juanito Doom, MD  iron polysaccharides (NIFEREX) 150 MG capsule Take 150 mg daily by mouth.   Yes [provider]  levothyroxine (SYNTHROID, LEVOTHROID) 125 MCG tablet Take 62.5-125 mcg by mouth See admin instructions. Take 125 mcg daily except Sundays take 62.5 mcg   Yes [provider]  RABEprazole (ACIPHEX) 20 MG tablet Take 30-60 min before first meal of the day Patient taking differently: Take 30-60 mg by mouth daily. Before first meal of the day 12/23/16  Yes Tanda Rockers, MD  ipratropium-albuterol (DUONEB) 0.5-2.5 (3) MG/3ML SOLN Take 3 mLs by nebulization every 4 (four) hours as needed. Patient not taking: Reported on 03/19/2017 03/11/17   Juanito Doom, MD  OXYGEN 2lpm with rest and 3lpm with exertion    [provider]    Family History Family History  Problem Relation Age of Onset  . Lung cancer Brother   . Heart disease Father   . Cervical cancer Sister   . Heart disease Brother   . Cancer Brother        small cell lung cancer    Social History Social History   Tobacco Use  . Smoking status: Former Smoker    Packs/day: 1.00    Years: 15.00    Pack years: 15.00    Last attempt to quit: 02/25/1998    Years since quitting: 19.0  . Smokeless tobacco: Never Used  Substance Use Topics  . Alcohol use: No    Alcohol/week: 0.0 oz  . Drug use: No     Allergies   Latex and Codeine   Review  of Systems Review of Systems  Constitutional: Positive for fatigue. Negative for chills and fever.  Eyes: Negative for visual disturbance.  Respiratory: Positive for cough, chest tightness and shortness of breath.   Cardiovascular: Positive for chest pain. Negative for palpitations and leg swelling.  Gastrointestinal: Negative for abdominal distention, abdominal pain, constipation, diarrhea, nausea and vomiting.  Musculoskeletal: Negative for back pain, myalgias and neck pain.  Skin: Negative for rash and wound.  Neurological: Negative for dizziness, syncope, weakness, light-headedness, numbness and headaches.  Psychiatric/Behavioral: The patient is nervous/anxious.   All other systems reviewed and are negative.    Physical Exam Updated Vital Signs BP 108/63 (BP Location: Left Arm)   Pulse 100   Temp 97.7 F (36.5 C) (Oral)   Resp 20   Ht 5\' 5"  (1.651 m)   Wt 82.3 kg (181 lb 8 oz)   SpO2 94%   BMI 30.20 kg/m   Physical Exam  Constitutional: She is oriented to person, place, and time. She appears well-developed and well-nourished.  Anxious appearing  HENT:  Head: Normocephalic and atraumatic.  Mouth/Throat: Oropharynx is clear and moist.  Eyes: EOM are normal. Pupils are equal, round, and reactive to  light.  Neck: Normal range of motion. Neck supple.  Cardiovascular: Regular rhythm.  Tachycardia  Pulmonary/Chest: Breath sounds normal. No respiratory distress. She has no wheezes. She exhibits no tenderness.  Mild increased respiratory effort.  Abdominal: Soft. Bowel sounds are normal. There is no tenderness. There is no rebound and no guarding.  Musculoskeletal: Normal range of motion. She exhibits no edema or tenderness.  No lower extremity swelling, asymmetry or tenderness.  Neurological: She is alert and oriented to person, place, and time.  Moves all extremities without focal deficit.  Sensation intact.  Skin: Skin is warm and dry. Capillary refill takes less than 2 seconds. No rash noted. No erythema.  Psychiatric: She has a normal mood and affect. Her behavior is normal.  Nursing note and vitals reviewed.    ED Treatments / Results  Labs (all labs ordered are listed, but only abnormal results are displayed) Labs Reviewed  CBC WITH DIFFERENTIAL/PLATELET - Abnormal; Notable for the following components:      Result Value   WBC 1.4 (*)    RBC 3.19 (*)    Hemoglobin 10.4 (*)    HCT 30.4 (*)    RDW 16.8 (*)    Platelets 88 (*)    Neutro Abs 0.6 (*)    Monocytes Absolute 0.0 (*)    All other components within normal limits  COMPREHENSIVE METABOLIC PANEL - Abnormal; Notable for the following components:   Glucose, Bld 110 (*)    ALT 13 (*)    All other components within normal limits  BRAIN NATRIURETIC PEPTIDE - Abnormal; Notable for the following components:   B Natriuretic Peptide 175.3 (*)    All other components within normal limits  TROPONIN I - Abnormal; Notable for the following components:   Troponin I 0.04 (*)    All other components within normal limits    EKG  EKG Interpretation  Date/Time:  Wednesday March 19 2017 12:14:20 EST Ventricular Rate:  101 PR Interval:    QRS Duration: 82 QT Interval:  354 QTC Calculation: 459 R Axis:   34 Text  Interpretation:  Sinus tachycardia Confirmed by Julianne Rice [phone]) on 03/19/2017 3:35:44 PM       Radiology Ct Angio Chest Pe W And/or Wo Contrast  Result Date: 03/19/2017 CLINICAL DATA:  Dyspnea worsening  since 10:15 a.m. patient has a known lung mass in the right upper lobe and was supposed to start chemotherapy today. EXAM: CT ANGIOGRAPHY CHEST WITH CONTRAST TECHNIQUE: Multidetector CT imaging of the chest was performed using the standard protocol during bolus administration of intravenous contrast. Multiplanar CT image reconstructions and MIPs were obtained to evaluate the vascular anatomy. CONTRAST:  192mL ISOVUE-370 IOPAMIDOL (ISOVUE-370) INJECTION 76% COMPARISON:  01/12/2017 FINDINGS: Cardiovascular: Normal 3 vessel branch pattern of the great vessels with atherosclerosis noted proximally. Aortic atherosclerosis is also noted without aneurysm or dissection. Good opacification of the pulmonary arteries. Mild dilatation of the main pulmonary artery to 3.2 cm compatible with changes of chronic pulmonary hypertension. No evidence of acute pulmonary embolus. Mild cardiomegaly without pericardial effusion. Coronary arteriosclerosis. Slight dextroversion of the heart due to volume loss on the right. There remains slight compression of the main right pulmonary artery secondary to post radiative change as well as obliteration of the right upper lobe pulmonary artery. Mediastinum/Nodes: No adenopathy at the base of the neck. No supraclavicular nor axillary lymphadenopathy. No thyromegaly or mass. There remains soft tissue density emanating from the right hilum superiorly impressing upon the adjacent right pulmonary artery and tracheobronchial tree. This measures approximately 3.2 x 2.4 cm versus 3 x 2.3 cm previously and is without significant change. Stable right infrahilar and small subcarinal lymph nodes as before. The largest is subcarinal approximately 1.2 cm short axis. Lungs/Pleura: Stable  centrilobular and paraseptal emphysematous change with volume loss on the right consistent with post treatment change and radiation fibrosis. Superimposed on emphysema are faint diffuse ground-glass opacities within both lungs that may represent a component of alveolitis or pneumonitis versus stigmata of mild pulmonary edema. No new focal infiltrate nor pulmonary nodules. Upper Abdomen: The included upper abdomen is nonacute. Musculoskeletal: Degenerative changes of the thoracic spine without acute compression deformity. Stable kyphoplasty at T5 with chronic stable superior endplate compression of T6. No aggressive osteolytic or blastic lesions. Review of the MIP images confirms the above findings. IMPRESSION: 1. No acute pulmonary embolus. Redemonstration of occluded right upper lobe pulmonary arterial branch likely secondary to post treatment change and fibrosis. 2. Stable masslike opacity about the right hilum measuring approximately 3.2 x 2.4 cm versus 3 x 2.3 cm previously, slightly impressing upon the adjacent right pulmonary artery and tracheobronchial tree. 3. Stable small right infrahilar and subcarinal lymph nodes. 4. Faint ground-glass opacities of the lungs superimposed on emphysema, nonspecific but may represent stigmata of mild pulmonary edema, alveolitis or pneumonitis, or an atypical infection among some possibilities. 5. Coronary arteriosclerosis. 6. T5 kyphoplasty with chronic superior endplate compression of T6. No suspicious osseous lesions. Aortic Atherosclerosis (ICD10-I70.0) and Emphysema (ICD10-J43.9). Electronically Signed   By: Ashley Royalty M.D.   On: 03/19/2017 15:04   Dg Chest Port 1 View  Result Date: 03/19/2017 CLINICAL DATA:  History lung cancer.  Decreased oxygen saturation EXAM: PORTABLE CHEST 1 VIEW COMPARISON:  CT chest 01/12/2017 FINDINGS: Stable right perihilar and upper lobe posttreatment changes. Right lung volume loss. Underlying COPD. No pleural effusion or pneumothorax.  No focal consolidation. Stable cardiomediastinal silhouette. No acute osseous abnormality. Prior upper-mid thoracic vertebral body augmentation. IMPRESSION: No acute cardiopulmonary disease. Stable right perihilar and upper lobe posttreatment changes. Electronically Signed   By: Kathreen Devoid   On: 03/19/2017 12:42    Procedures Procedures (including critical care time)  Medications Ordered in ED Medications  iopamidol (ISOVUE-370) 76 % injection (not administered)  iopamidol (ISOVUE-370) 76 % injection 100 mL (100  mLs Intravenous Contrast Given 03/19/17 1431)     Initial Impression / Assessment and Plan / ED Course  I have reviewed the triage vital signs and the nursing notes.  Pertinent labs & imaging results that were available during my care of the patient were reviewed by me and considered in my medical decision making (see chart for details).    Groundglass opacity on CT angios.  No evidence of PE.  Differential includes atypical infection versus edema.  Patient does have a mild elevation in her BNP.  Leukopenia.  Chest pressure is resolved.  Discussed with hospitalist who will see patient and admit.   Final Clinical Impressions(s) / ED Diagnoses   Final diagnoses:  Dyspnea on exertion    ED Discharge Orders    None       Julianne Rice, MD 03/19/17 1536

## 2017-03-19 NOTE — H&P (Signed)
History and Physical    Dawn Mercer GYJ:[id number] DOB: 14-Dec-1945 DOA: 03/19/2017  PCP: Lawerance Cruel, MD   I have briefly reviewed patients previous medical reports in [facility].  Patient coming from: home   Chief Complaint: Shortness of breath, nonproductive cough, chills.  HPI: Dawn Mercer is a 72 year old female with a past medical history significant for chronic respiratory failure (due to COPD/emphysema), lung cancer, anxiety, GERD and hypothyroidism: Who presented to the emergency department secondary to worsening shortness of breath and increase needs of oxygen supplementation.  Patient reports that her symptoms presented suddenly in the last 24 hours; she reports no improvement with the use of nebulizer at home and denies any chest pain.  There has not been any fevers, but positive chills.  Patient also expressed some nonproductive cough.  Of note, she noticed her oxygen saturation at home to be as low as the low 80s requiring up to 5 L of oxygen supplementation to gated into the high 80s.   Patient denies fever, chest pain, nausea, vomiting, abdominal pain, dysuria, hematuria, melena, hematochezia, headaches, focal weakness or any other complaints.  ED Course: Chest x-ray and CT on U has been done, concerns for atypical pneumonia/pneumonitis and vascular congestion with presumably mild interstitial edema appreciated.  Workup demonstrated leukopenia and the patient is requiring 3-5 L of oxygen supplementation while in the ED to maintain her O2 sats above 90%.  Try a hospitalist has been contacted to admit the patient for further evaluation and treatment.  Review of Systems:  All other systems reviewed and apart from HPI, are negative.  Past Medical History:  Diagnosis Date  . Anxiety   . Bronchitis    Hx: of  . 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 Chest congestion 04/11/2016  . Claustrophobia   . COPD (chronic obstructive  pulmonary disease) ([place])   . GERD (gastroesophageal reflux disease)   . H/O hiatal hernia   . Hay fever    Hx: of  . History of radiation therapy 01/28/13-03/23/13   63 gray to right upper lobe  . Hypothyroidism   . IBS (irritable bowel syndrome)    Hx: of  . Lung cancer, upper lobe ([place]) dx'd 12/2012   chemo and XRt complete  . 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 Pneumonia    years ago  . Seizures ([place])    Hx: of over 40 years ago  . Shortness of breath    Hx: of with exertion  . Status post radiation therapy 10/1999  . 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 CHOLECYSTECTOMY N/A 08/03/2013   Procedure: LAPAROSCOPIC CHOLECYSTECTOMY;  Surgeon: Harl Bowie, MD;  Location: [place];  Service: General;  Laterality: N/A;  . COLONOSCOPY W/ BIOPSIES AND POLYPECTOMY     Hx: of  . IR GENERIC HISTORICAL  04/11/2016   IR REMOVAL TUN ACCESS W/ PORT W/O FL MOD SED 04/11/2016 Arne Cleveland, MD [facility]-INTERV RAD  . KYPHOPLASTY N/A 12/06/2015   Procedure: THORACIC 5 KYPHOPLASTY;  Surgeon: Melina Schools, MD;  Location: [place];  Service: Orthopedics;  Laterality: N/A;  . LASER ABLATION  04/2004   for VIN III  . PORTACATH PLACEMENT    . TUBAL LIGATION    . VIDEO BRONCHOSCOPY Bilateral 02/14/2017   Procedure: VIDEO BRONCHOSCOPY WITH FLUORO;  Surgeon: Juanito Doom, MD;  Location: [facility];  Service: Cardiopulmonary;  Laterality: Bilateral;  . VIDEO BRONCHOSCOPY WITH ENDOBRONCHIAL NAVIGATION N/A 12/31/2012   Procedure: VIDEO BRONCHOSCOPY WITH ENDOBRONCHIAL NAVIGATION;  Surgeon: Melrose Nakayama, MD;  Location: [place];  Service: Thoracic;  Laterality: N/A;  . VIDEO BRONCHOSCOPY WITH ENDOBRONCHIAL ULTRASOUND N/A 12/31/2012   Procedure: VIDEO BRONCHOSCOPY WITH ENDOBRONCHIAL ULTRASOUND;  Surgeon: Melrose Nakayama, MD;  Location: [place];  Service: Thoracic;  Laterality: N/A;    Social History  reports that she  quit smoking about 19 years ago. She has a 15.00 pack-year smoking history. she has never used smokeless tobacco. She reports that she does not drink alcohol or use drugs.  Allergies  Allergen Reactions  . Latex Dermatitis  . Codeine Nausea Only    Family History  Problem Relation Age of Onset  . Lung cancer Brother   . Heart disease Father   . Cervical cancer Sister   . Heart disease Brother   . Cancer Brother        small cell lung cancer    Prior to Admission medications   Medication Sig Start Date End Date Taking? Authorizing Provider  albuterol (PROVENTIL HFA;VENTOLIN HFA) 108 (90 Base) MCG/ACT inhaler Inhale 2 puffs into the lungs every 6 (six) hours as needed for wheezing or shortness of breath.   Yes [provider]  albuterol (PROVENTIL) (2.5 MG/3ML) 0.083% nebulizer solution Take 3 mLs (2.5 mg total) by nebulization every 6 (six) hours as needed for wheezing or shortness of breath. 03/11/17  Yes Juanito Doom, MD  ALPRAZolam [name]) 0.5 MG tablet Take 0.5 tablets (0.25 mg total) by mouth 2 (two) times daily as needed for anxiety. 01/11/16  Yes Gery Pray, MD  aspirin EC 325 MG tablet Take 325 mg by mouth daily as needed for mild pain or moderate pain.   Yes [provider]  Cholecalciferol (VITAMIN D3) 1000 units CAPS Take 1,000 Units by mouth daily.   Yes [provider]  cyanocobalamin (,VITAMIN B-12,) 1000 MCG/ML injection Inject 1,000 mcg into the muscle every 30 (thirty) days.   Yes [provider]  Glycopyrrolate-Formoterol (BEVESPI AEROSPHERE) 9-4.8 MCG/ACT AERO Inhale 2 Inhalers into the lungs 2 (two) times daily. Inhale 2 puffs daily, rinse mouth after use. 03/12/17  Yes Juanito Doom, MD  iron polysaccharides (NIFEREX) 150 MG capsule Take 150 mg daily by mouth.   Yes [provider]  levothyroxine (SYNTHROID, LEVOTHROID) 125 MCG tablet Take 62.5-125 mcg by mouth See admin instructions. Take 125 mcg daily except  Sundays take 62.5 mcg   Yes [provider]  RABEprazole (ACIPHEX) 20 MG tablet Take 30-60 min before first meal of the day Patient taking differently: Take 30-60 mg by mouth daily. Before first meal of the day 12/23/16  Yes Tanda Rockers, MD  ipratropium-albuterol (DUONEB) 0.5-2.5 (3) MG/3ML SOLN Take 3 mLs by nebulization every 4 (four) hours as needed. Patient not taking: Reported on 03/19/2017 03/11/17   Juanito Doom, MD  OXYGEN 2lpm with rest and 3lpm with exertion    [provider]    Physical Exam: Vitals:   03/19/17 1148 03/19/17 1222 03/19/17 1224  BP:   108/63  Pulse:   100  Resp:   20  Temp:   97.7 F (36.5 C)  TempSrc:  Oral Oral  SpO2:   94%  Weight: 82.3 kg (181 lb 8 oz)    Height: 5\' 5"  (1.651 m)     Constitutional: Currently afebrile, denies chest pain, patient with mild difficulty  speaking in full sentences and short of breath/tachypneic with minimal exertion.  Denies nausea, vomiting, dysuria, abdominal pain or any other complaints. Eyes: PERTLA, lids and conjunctivae normal, no icterus, no nystagmus. ENMT: Mucous membranes are moist. Posterior pharynx clear of any exudate or lesions. No thrush. Neck: supple, no masses, no thyromegaly, unable to properly assess JVD secondary to body habitus. Respiratory: Scattered rhonchi, decreased breath sounds bases bilaterally, mild expiratory wheezing.  Patient tachypneic and short of breath with minimal exertion.  No using accessory muscles; wearing 3 L nasal cannula oxygen supplementation. Cardiovascular: S1 & S2 heard, regular rate and rhythm, no murmurs / rubs / gallops.  Trace edema bilaterally up to her knees. No carotid bruits.  Abdomen: No distension, no tenderness, no masses palpated. No hepatosplenomegaly. Bowel sounds normal.  Musculoskeletal: no clubbing / cyanosis. No joint deformity upper and lower extremities. Good ROM, no contractures. Normal muscle tone.  Skin: no rashes, no petechiae, no  lesions or indurations.   Neurologic: CN 2-12 grossly intact. Sensation intact, DTR normal. Strength 5/5 in all 4 limbs.  Psychiatric: Normal judgment and insight. Alert and oriented x 3. Normal mood.     Labs on Admission: I have personally reviewed following labs and imaging studies  CBC: Recent Labs  Lab 03/19/17 1213  WBC 1.4*  NEUTROABS 0.6*  HGB 10.4*  HCT 30.4*  MCV 95.3  PLT 88*   Basic Metabolic Panel: Recent Labs  Lab 03/19/17 1213  NA 140  K 3.5  CL 102  CO2 27  GLUCOSE 110*  BUN 18  CREATININE 0.81  CALCIUM 8.9   Liver Function Tests: Recent Labs  Lab 03/19/17 1213  AST 19  ALT 13*  ALKPHOS 80  BILITOT 0.9  PROT 7.5  ALBUMIN 3.6   Cardiac Enzymes: Recent Labs  Lab 03/19/17 1213  TROPONINI 0.04*   Urine analysis:    Component Value Date/Time   COLORURINE YELLOW 08/25/2016 [place] 08/25/2016 1631   LABSPEC 1.017 08/25/2016 1631   PHURINE 5.0 08/25/2016 1631   GLUCOSEU NEGATIVE 08/25/2016 1631   HGBUR NEGATIVE 08/25/2016 1631   BILIRUBINUR NEGATIVE 08/25/2016 1631   KETONESUR NEGATIVE 08/25/2016 1631   PROTEINUR NEGATIVE 08/25/2016 1631   NITRITE NEGATIVE 08/25/2016 1631   LEUKOCYTESUR NEGATIVE 08/25/2016 1631   Radiological Exams on Admission: Ct Angio Chest Pe W And/or Wo Contrast  Result Date: 03/19/2017 CLINICAL DATA:  Dyspnea worsening since 10:15 a.m. patient has a known lung mass in the right upper lobe and was supposed to start chemotherapy today. EXAM: CT ANGIOGRAPHY CHEST WITH CONTRAST TECHNIQUE: Multidetector CT imaging of the chest was performed using the standard protocol during bolus administration of intravenous contrast. Multiplanar CT image reconstructions and MIPs were obtained to evaluate the vascular anatomy. CONTRAST:  146mL ISOVUE-370 IOPAMIDOL (ISOVUE-370) INJECTION 76% COMPARISON:  01/12/2017 FINDINGS: Cardiovascular: Normal 3 vessel branch pattern of the great vessels with atherosclerosis noted  proximally. Aortic atherosclerosis is also noted without aneurysm or dissection. Good opacification of the pulmonary arteries. Mild dilatation of the main pulmonary artery to 3.2 cm compatible with changes of chronic pulmonary hypertension. No evidence of acute pulmonary embolus. Mild cardiomegaly without pericardial effusion. Coronary arteriosclerosis. Slight dextroversion of the heart due to volume loss on the right. There remains slight compression of the main right pulmonary artery secondary to post radiative change as well as obliteration of the right upper lobe pulmonary artery. Mediastinum/Nodes: No adenopathy at the base of the neck. No supraclavicular nor axillary lymphadenopathy. No thyromegaly or  mass. There remains soft tissue density emanating from the right hilum superiorly impressing upon the adjacent right pulmonary artery and tracheobronchial tree. This measures approximately 3.2 x 2.4 cm versus 3 x 2.3 cm previously and is without significant change. Stable right infrahilar and small subcarinal lymph nodes as before. The largest is subcarinal approximately 1.2 cm short axis. Lungs/Pleura: Stable centrilobular and paraseptal emphysematous change with volume loss on the right consistent with post treatment change and radiation fibrosis. Superimposed on emphysema are faint diffuse ground-glass opacities within both lungs that may represent a component of alveolitis or pneumonitis versus stigmata of mild pulmonary edema. No new focal infiltrate nor pulmonary nodules. Upper Abdomen: The included upper abdomen is nonacute. Musculoskeletal: Degenerative changes of the thoracic spine without acute compression deformity. Stable kyphoplasty at T5 with chronic stable superior endplate compression of T6. No aggressive osteolytic or blastic lesions. Review of the MIP images confirms the above findings. IMPRESSION: 1. No acute pulmonary embolus. Redemonstration of occluded right upper lobe pulmonary arterial  branch likely secondary to post treatment change and fibrosis. 2. Stable masslike opacity about the right hilum measuring approximately 3.2 x 2.4 cm versus 3 x 2.3 cm previously, slightly impressing upon the adjacent right pulmonary artery and tracheobronchial tree. 3. Stable small right infrahilar and subcarinal lymph nodes. 4. Faint ground-glass opacities of the lungs superimposed on emphysema, nonspecific but may represent stigmata of mild pulmonary edema, alveolitis or pneumonitis, or an atypical infection among some possibilities. 5. Coronary arteriosclerosis. 6. T5 kyphoplasty with chronic superior endplate compression of T6. No suspicious osseous lesions. Aortic Atherosclerosis (ICD10-I70.0) and Emphysema (ICD10-J43.9). Electronically Signed   By: Ashley Royalty M.D.   On: 03/19/2017 15:04   Dg Chest Port 1 View  Result Date: 03/19/2017 CLINICAL DATA:  History lung cancer.  Decreased oxygen saturation EXAM: PORTABLE CHEST 1 VIEW COMPARISON:  CT chest 01/12/2017 FINDINGS: Stable right perihilar and upper lobe posttreatment changes. Right lung volume loss. Underlying COPD. No pleural effusion or pneumothorax. No focal consolidation. Stable cardiomediastinal silhouette. No acute osseous abnormality. Prior upper-mid thoracic vertebral body augmentation. IMPRESSION: No acute cardiopulmonary disease. Stable right perihilar and upper lobe posttreatment changes. Electronically Signed   By: Kathreen Devoid   On: 03/19/2017 12:42    EKG:  Sinus tachycardia, normal axis, no acute ischemic changes appreciated.  Assessment/Plan 1-acute on chronic respiratory failure with hypoxia (HCC) -Appears to be secondary to COPD exacerbation with bronchiectasis and presumed mild acute diastolic heart failure. -Patient will be admitted to telemetry, daily weights, low-sodium diet, strict intake and output, will check 2D echo and give IV Lasix. -Simultaneously will be treated with doxycycline for bronchiectasis and Solu-Medrol  for his COPD exacerbation.  Will continue with Brovana and Ellipta, along with as needed Duoneb. -follow clinical response  -will use mucinex and also flutter valve/IS -mild elevation on troponin most likely in setting of demand ischemia.  2-Malignant neoplasm of upper lobe of lung ([place]) -Dr. Earlie Server has been informed about patient's admission through epic -Will follow further recommendations -Patient was in the process of initiate chemotherapy for her lung cancer.  3-COPD with acute exacerbation ([place]) -as mentioned above: steroids, nebulizer treatment, mucinex, doxycycline, oxygen supplementation (weaning down to home usage)  4-Hypothyroidism -will check TSH -continue synthroid   5-Obesity (BMI 30-39.9) -low calorie diet and exercise encouraged   6-Acute diastolic CHF (congestive heart failure) ([place]): presumed diastolic in nature -education regarding low sodium diet provided -will follow daily weights and strict intake and output -check Echo -IV lasix initiated -  will follow daily BMET -follow electrolytes and replete as needed   7-Bronchiectasis ([place]) -will treat with doxycycline  -mucinex also ordered   8-Pancytopenia ([place]) -most likely associated with malignancy  -no signs of overt bleeding  -will follow CBC -Hgb stable   9-GERD -will continue PPI  Time: 70 minutes   DVT prophylaxis: Lovenox Code Status: Full code Family Communication: Husband at bedside Disposition Plan: To be determined.  But most likely home in the next 48 hours once her respiratory status improved. Consults called: None Admission status: Inpatient, length of stay more than 2 midnights, telemetry bed.   Barton Dubois MD [facility] Pager [phone]  If 7PM-7AM, please contact night-coverage [email or web address] Password [facility]  03/19/2017, 6:44 PM

## 2017-03-20 ENCOUNTER — Inpatient Hospital Stay (HOSPITAL_COMMUNITY): Payer: Medicare Other

## 2017-03-20 ENCOUNTER — Encounter (HOSPITAL_COMMUNITY): Payer: Self-pay

## 2017-03-20 DIAGNOSIS — J9621 Acute and chronic respiratory failure with hypoxia: Secondary | ICD-10-CM

## 2017-03-20 DIAGNOSIS — I509 Heart failure, unspecified: Secondary | ICD-10-CM

## 2017-03-20 LAB — ECHOCARDIOGRAM COMPLETE
HEIGHTINCHES: 65 in
WEIGHTICAEL: 2908.8 [oz_av]

## 2017-03-20 LAB — BASIC METABOLIC PANEL
Anion gap: 13 (ref 5–15)
BUN: 18 mg/dL (ref 6–20)
CO2: 25 mmol/L (ref 22–32)
CREATININE: 0.89 mg/dL (ref 0.44–1.00)
Calcium: 8.5 mg/dL — ABNORMAL LOW (ref 8.9–10.3)
Chloride: 100 mmol/L — ABNORMAL LOW (ref 101–111)
GFR calc Af Amer: >60 mL/min (ref >60)
GLUCOSE: 162 mg/dL — AB (ref 65–99)
Potassium: 3.6 mmol/L (ref 3.5–5.1)
Sodium: 138 mmol/L (ref 135–145)

## 2017-03-20 MED ORDER — IPRATROPIUM-ALBUTEROL 0.5-2.5 (3) MG/3ML IN SOLN
3.0000 mL | Freq: Three times a day (TID) | RESPIRATORY_TRACT | Status: DC
  Administered 2017-03-20 – 2017-03-22 (×7): 3 mL via RESPIRATORY_TRACT
  Filled 2017-03-20 (×6): qty 3

## 2017-03-20 MED ORDER — ALBUTEROL SULFATE (2.5 MG/3ML) 0.083% IN NEBU: 2.5000 mg | INHALATION_SOLUTION | RESPIRATORY_TRACT | Status: DC | PRN

## 2017-03-20 MED ORDER — IPRATROPIUM-ALBUTEROL 0.5-2.5 (3) MG/3ML IN SOLN: 3.0000 mL | Freq: Four times a day (QID) | RESPIRATORY_TRACT | Status: DC

## 2017-03-20 MED ORDER — LOPERAMIDE HCL 2 MG PO CAPS
2.0000 mg | ORAL_CAPSULE | ORAL | Status: DC | PRN
  Administered 2017-03-20 – 2017-03-22 (×2): 2 mg via ORAL
  Filled 2017-03-20 (×2): qty 1

## 2017-03-20 NOTE — Progress Notes (Signed)
PROGRESS NOTE  Dawn Mercer DGL:[id number] DOB: 12-29-1945 DOA: 03/19/2017 PCP: Lawerance Cruel, MD  HPI/Recap of past 24 hours:  Dawn Mercer is a 72 y.o. year old female with medical history significant for chronic respiratory failure (due to COPD/emphysema), lung cancer, anxiety, GERD and hypothyroidism who presented on 03/19/2017 with progressive dyspnea over several weeks and acute worsening requiring increase in oxygen supplementation and was found to have acute on chronic respiratory failure    This morning, she notes her breathing feels much better from overnight. No cough, chest pain, fevers or chills.   Assessment/Plan: Principal Problem:   Acute on chronic respiratory failure with hypoxia (HCC) Active Problems:   Malignant neoplasm of upper lobe of lung (HCC)   COPD with acute exacerbation (HCC)   Hypothyroidism   Obesity (BMI 30-39.9)   Acute diastolic CHF (congestive heart failure) (HCC)   Bronchiectasis (HCC)   Pancytopenia (HCC)   Acute on chronic respiratory failure, suspect multifactorial etiology including progression of lung cancer, CHF exacerbation, COPD exacerbation, stable On increased oxygen of 4 L currently.  Reports progressive dyspnea over the past several weeks, however no noted orthopnea or peripheral edema.  BNP not impressive, TTE shows slightly reduced EF of 45-50% with septal hypokinesis.  PE ruled out on CTA chest.  Continue scheduled inhalers, since telemetry, IV Lasix, strict I's and O's, IV steroids  COPD exacerbation. Scheduled duonebs, IC, IV steroids for now, holding home inhaler therapy. Doxycycline.   Acute CHFrEF exacerbation. TTE showed reduced EF, seems to be new diagnosis. Septal hypokinesis present. Continue IV lasix 20 mg (since naive), daily weights, I/o's. Likely benefit from starting BB and Acei given reduced EF eventually  Pancytopenia. S/p Granix therapy by her oncologist who noted her low ANC cont on 1/16 ( prior to  starting any chemotherapy) Query any BM involvement in recurrent lung cancer. Monitor CBC  Recurrent NSCLC s/p chemoradiation over 4 years ago. Followed by Dr. Julien Nordmann. Scheduled to start 1st cycle of palliative chemotherapy 1/23 but was admitted instead  Hypothyroidism. Home synthroid  Code Status: Full code  Family Communication: Family updated at bedside  Disposition Plan: Improvement in respiratory status with IV diuresis and scheduled inhaler therapy.   Consultants:  None  Procedures:  TTE on 03/20/17,  Study Conclusions  - Left ventricle: The cavity size was normal. Wall thickness was   normal. Systolic function was mildly reduced. The estimated   ejection fraction was in the range of 45% to 50%. There is   hypokinesis of the septal myocardium. The study is not   technically sufficient to allow evaluation of LV diastolic   function. - Right ventricle: The cavity size was mildly dilated. Systolic   function was moderately reduced. - Pulmonary arteries: PA peak pressure: 35 mm Hg (S). - Pericardium, extracardiac: A trivial pericardial effusion was   identified.  Impressions:  - Septal hypokinesis with overall mild LV dysfunction; mild RVE   with moderate RV dysfunction.  Antimicrobials:  None  Cultures:  None  DVT prophylaxis: Lovenox   Objective: Vitals:   03/20/17 0451 03/20/17 0816 03/20/17 1251 03/20/17 1513  BP: (!) 109/59  114/74   Pulse: (!) 104 (!) 108 (!) 108 98  Resp: 18 18 18 18   Temp: (!) 97.4 F (36.3 C)  97.9 F (36.6 C)   TempSrc: Oral  Oral   SpO2: 91% 96% 100% 92%  Weight: 82.5 kg (181 lb 12.8 oz)     Height:  Intake/Output Summary (Last 24 hours) at 03/20/2017 1748 Last data filed at 03/20/2017 0600 Gross per 24 hour  Intake 223 ml  Output -  Net 223 ml   Filed Weights   03/19/17 1148 03/19/17 2123 03/20/17 0451  Weight: 82.3 kg (181 lb 8 oz) 82.1 kg (181 lb) 82.5 kg (181 lb 12.8 oz)    Exam:  General: Lying  in bed, in no apparent distres Eyes: EOMI, anicteric ENT: Oral Mucosa clear and oist Neck: normal, no appreciable JVD Cardiovascular: regular rate and rhythm, no murmurs, rubs or gallops, trace lower extremity edema Respiratory: Normal respiratory effort on 4 L [place], decreased breath sounds on right side,  no rales, wheezes or rhonchi Abdomen: soft, non-distended, non-tender, normal bowel sounds, no guarding or rebound tenderness Skin: No Rash Musculoskeletal:No clubbing / cyanosis. No joint deformity upper and lower extremities. Good ROM, no contractures. Normal muscle tone Neurologic: Grossly no focal neuro deficit.Mental status AAOx3, speech normal, Psychiatric:Appropriate affect, and mood  Data Reviewed: CBC: Recent Labs  Lab 03/19/17 1213  WBC 1.4*  NEUTROABS 0.6*  HGB 10.4*  HCT 30.4*  MCV 95.3  PLT 88*   Basic Metabolic Panel: Recent Labs  Lab 03/19/17 1213 03/20/17 0506  NA 140 138  K 3.5 3.6  CL 102 100*  CO2 27 25  GLUCOSE 110* 162*  BUN 18 18  CREATININE 0.81 0.89  CALCIUM 8.9 8.5*   GFR: Estimated Creatinine Clearance: 62.4 mL/min (by C-G formula based on SCr of 0.89 mg/dL). Liver Function Tests: Recent Labs  Lab 03/19/17 1213  AST 19  ALT 13*  ALKPHOS 80  BILITOT 0.9  PROT 7.5  ALBUMIN 3.6   No results for input(s): LIPASE, AMYLASE in the last 168 hours. No results for input(s): AMMONIA in the last 168 hours. Coagulation Profile: No results for input(s): INR, PROTIME in the last 168 hours. Cardiac Enzymes: Recent Labs  Lab 03/19/17 1213  TROPONINI 0.04*   BNP (last 3 results) No results for input(s): PROBNP in the last 8760 hours. HbA1C: No results for input(s): HGBA1C in the last 72 hours. CBG: No results for input(s): GLUCAP in the last 168 hours. Lipid Profile: No results for input(s): CHOL, HDL, LDLCALC, TRIG, CHOLHDL, LDLDIRECT in the last 72 hours. Thyroid Function Tests: No results for input(s): TSH, T4TOTAL, FREET4, T3FREE,  THYROIDAB in the last 72 hours. Anemia Panel: No results for input(s): VITAMINB12, FOLATE, FERRITIN, TIBC, IRON, RETICCTPCT in the last 72 hours. Urine analysis:    Component Value Date/Time   COLORURINE YELLOW 08/25/2016 [place] 08/25/2016 1631   LABSPEC 1.017 08/25/2016 1631   PHURINE 5.0 08/25/2016 1631   GLUCOSEU NEGATIVE 08/25/2016 1631   HGBUR NEGATIVE 08/25/2016 1631   BILIRUBINUR NEGATIVE 08/25/2016 1631   KETONESUR NEGATIVE 08/25/2016 1631   PROTEINUR NEGATIVE 08/25/2016 1631   NITRITE NEGATIVE 08/25/2016 1631   LEUKOCYTESUR NEGATIVE 08/25/2016 1631   Sepsis Labs: @LABRCNTIP (procalcitonin:4,lacticidven:4)  )No results found for this or any previous visit (from the past 240 hour(s)).    Studies: No results found.  Scheduled Meds: . cholecalciferol  1,000 Units Oral Daily  . doxycycline  100 mg Oral Q12H  . enoxaparin (LOVENOX) injection  40 mg Subcutaneous Q24H  . furosemide  20 mg Intravenous Daily  . ipratropium-albuterol  3 mL Nebulization TID  . iron polysaccharides  150 mg Oral Daily  . levothyroxine  125 mcg Oral Once per day on Mon Tue Wed Thu Fri Sat  . [START ON 03/23/2017] levothyroxine  62.5 mcg Oral Q Sun  . methylPREDNISolone (SOLU-MEDROL) injection  40 mg Intravenous Q8H  . pantoprazole  40 mg Oral Daily  . sodium chloride flush  3 mL Intravenous Q12H    Continuous Infusions: . sodium chloride       LOS: 1 day     Desiree Hane, MD [facility] Pager [phone]  If 7PM-7AM, please contact night-coverage [email or web address] Password [facility] 03/20/2017, 5:48 PM

## 2017-03-20 NOTE — Evaluation (Signed)
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  Clinical Impression  Pt admitted with above diagnosis. Pt currently with functional limitations due to the deficits listed below (see PT Problem List). Pt ambulated 8' without an assistive device, SaO2 93% on 4L O2 walking, HR 118, no loss of balance, 3/4 dyspnea. Encouraged pt to walk very short distances several times a day to minimize deconditioning, and to performed seated BUE/LE exercises for strengthening. Rollator recommended for energy conservation.  Pt will benefit from skilled PT to increase their independence and safety with mobility to allow discharge to the venue listed below.       Follow Up Recommendations Home health PT    Equipment Recommendations  Other (comment)(4 wheeled rolling walker with seat); toilet riser    Recommendations for Other Services       Precautions / Restrictions Precautions Precaution Comments: monitor O2 Restrictions Weight Bearing Restrictions: No      Mobility  Bed Mobility               General bed mobility comments: sitting up on edge of bed  Transfers Overall transfer level: Independent Equipment used: None             General transfer comment: SPT from bed to bedside commode then back to bed  Ambulation/Gait Ambulation/Gait assistance: Supervision Ambulation Distance (Feet): 8 Feet Assistive device: None Gait Pattern/deviations: Step-through pattern   Gait velocity interpretation: Below normal speed for age/gender General Gait Details: SaO2 93% on 4L O2 walking, 3/4 dyspnea,  no AD, distance limited by SOB  Stairs            Wheelchair Mobility    Modified Rankin (Stroke Patients Only)       Balance Overall balance assessment: Independent                                           Pertinent Vitals/Pain Pain Assessment: No/denies pain    Home Living Family/patient expects to be discharged to:: Private residence Living Arrangements: Spouse/significant other Available Help at Discharge: Family;Available PRN/intermittently(husband works out of town at times) Type of Home: [employer] Access: Stairs to enter Entrance Stairs-Rails: Building surveyor of Steps: [place]: One level Bandana: [employer] - single point      Prior Function Level of Independence: Independent         Comments: walks short distances only due to SOB, wears 2.5-3L O2 24* at home, sponge bathes (sometimes husband helps)     Hand Dominance        Extremity/Trunk Assessment   Upper Extremity Assessment Upper Extremity Assessment: Overall WFL for tasks assessed    Lower Extremity Assessment Lower Extremity Assessment: Overall WFL for tasks assessed    Cervical / Trunk Assessment Cervical / Trunk Assessment: Normal  Communication   Communication: No difficulties  Cognition Arousal/Alertness: Awake/alert Behavior During Therapy: WFL for tasks assessed/performed Overall Cognitive Status: Within Functional Limits for tasks assessed  General Comments      Exercises General Exercises - Lower Extremity Ankle Circles/Pumps: AROM;Both;10 reps;Seated Long Arc Quad: AROM;Both;5 reps;Seated Hip Flexion/Marching: AROM;5 reps;Both;Seated Shoulder Exercises Shoulder Flexion: AROM;Both;5 reps;Seated   Assessment/Plan    PT Assessment Patient needs continued PT services  PT Problem List Cardiopulmonary status limiting activity;Decreased activity tolerance       PT  Treatment Interventions Therapeutic exercise;Patient/family education    PT Goals (Current goals can be found in the Care Plan section)  Acute Rehab PT Goals Patient Stated Goal: be able to walk farther PT Goal Formulation: With patient/family Time For Goal Achievement: 04/03/17 Potential to Achieve Goals: Fair    Frequency Min 3X/week   Barriers to discharge        Co-evaluation               AM-PAC PT "6 Clicks" Daily Activity  Outcome Measure Difficulty turning over in bed (including adjusting bedclothes, sheets and blankets)?: A Little Difficulty moving from lying on back to sitting on the side of the bed? : A Little Difficulty sitting down on and standing up from a chair with arms (e.g., wheelchair, bedside commode, etc,.)?: None Help needed moving to and from a bed to chair (including a wheelchair)?: None Help needed walking in hospital room?: None Help needed climbing 3-5 steps with a railing? : A Little 6 Click Score: 21    End of Session Equipment Utilized During Treatment: Oxygen Activity Tolerance: Patient limited by fatigue Patient left: in bed;with family/visitor present Nurse Communication: Mobility status PT Visit Diagnosis: Difficulty in walking, not elsewhere classified (R26.2)    Time: 1251-1311 PT Time Calculation (min) (ACUTE ONLY): 20 min   Charges:   PT Evaluation $PT Eval Low Complexity: 1 Low     PT G CodesPhilomena Doheny 03/20/2017, 1:23 PM [phone]

## 2017-03-20 NOTE — Progress Notes (Signed)
  Echocardiogram 2D Echocardiogram has been performed.  Dawn Mercer 03/20/2017, 2:55 PM

## 2017-03-21 ENCOUNTER — Ambulatory Visit: Payer: Medicare Other

## 2017-03-21 LAB — CBC
HCT: 27 % — ABNORMAL LOW (ref 36.0–46.0)
HEMOGLOBIN: 9.2 g/dL — AB (ref 12.0–15.0)
MCH: 32.6 pg (ref 26.0–34.0)
MCHC: 34.1 g/dL (ref 30.0–36.0)
MCV: 95.7 fL (ref 78.0–100.0)
Platelets: 90 10*3/uL — ABNORMAL LOW (ref 150–400)
RBC: 2.82 MIL/uL — AB (ref 3.87–5.11)
RDW: 17.3 % — ABNORMAL HIGH (ref 11.5–15.5)
WBC: 1.2 10*3/uL — CL (ref 4.0–10.5)

## 2017-03-21 LAB — BASIC METABOLIC PANEL
ANION GAP: 10 (ref 5–15)
BUN: 24 mg/dL — ABNORMAL HIGH (ref 6–20)
CALCIUM: 8.4 mg/dL — AB (ref 8.9–10.3)
CO2: 26 mmol/L (ref 22–32)
Chloride: 100 mmol/L — ABNORMAL LOW (ref 101–111)
Creatinine, Ser: 1.01 mg/dL — ABNORMAL HIGH (ref 0.44–1.00)
GFR, EST NON AFRICAN AMERICAN: 55 mL/min — AB (ref >60)
Glucose, Bld: 154 mg/dL — ABNORMAL HIGH (ref 65–99)
Potassium: 4.7 mmol/L (ref 3.5–5.1)
Sodium: 136 mmol/L (ref 135–145)

## 2017-03-21 LAB — FUNGUS CULTURE WITH STAIN

## 2017-03-21 LAB — TYPE AND SCREEN
ABO/RH(D): O POS
ANTIBODY SCREEN: NEGATIVE

## 2017-03-21 LAB — DIFFERENTIAL
BASOS ABS: 0 10*3/uL (ref 0.0–0.1)
Basophils Relative: 2 %
EOS ABS: 0 10*3/uL (ref 0.0–0.7)
Eosinophils Relative: 0 %
LYMPHS ABS: 0.4 10*3/uL — AB (ref 0.7–4.0)
LYMPHS PCT: 30 %
MONOS PCT: 1 %
Monocytes Absolute: 0 10*3/uL — ABNORMAL LOW (ref 0.1–1.0)
NEUTROS PCT: 67 %
Neutro Abs: 0.8 10*3/uL — ABNORMAL LOW (ref 1.7–7.7)

## 2017-03-21 LAB — FUNGUS CULTURE RESULT

## 2017-03-21 LAB — ABO/RH: ABO/RH(D): O POS

## 2017-03-21 LAB — FUNGAL ORGANISM REFLEX

## 2017-03-21 MED ORDER — FUROSEMIDE 10 MG/ML IJ SOLN
40.0000 mg | Freq: Every day | INTRAMUSCULAR | Status: DC
  Administered 2017-03-22: 40 mg via INTRAVENOUS
  Filled 2017-03-21: qty 4

## 2017-03-21 MED ORDER — HYDROCODONE-ACETAMINOPHEN 5-325 MG PO TABS
1.0000 | ORAL_TABLET | Freq: Four times a day (QID) | ORAL | Status: DC | PRN
  Administered 2017-03-21 – 2017-03-23 (×5): 1 via ORAL
  Filled 2017-03-21 (×5): qty 1

## 2017-03-21 MED ORDER — FUROSEMIDE 10 MG/ML IJ SOLN
20.0000 mg | Freq: Once | INTRAMUSCULAR | Status: AC
  Administered 2017-03-21: 20 mg via INTRAVENOUS
  Filled 2017-03-21: qty 2

## 2017-03-21 MED ORDER — PREDNISONE 20 MG PO TABS
40.0000 mg | ORAL_TABLET | Freq: Every day | ORAL | Status: AC
  Administered 2017-03-22 – 2017-03-24 (×3): 40 mg via ORAL
  Filled 2017-03-21 (×3): qty 2

## 2017-03-21 NOTE — Progress Notes (Signed)
   03/21/17 1100  PT Visit Information  Last PT Received On 03/21/17  Reason Eval/Treat Not Completed Fatigue/lethargy limiting ability to participate

## 2017-03-21 NOTE — Progress Notes (Signed)
PROGRESS NOTE  Dawn Mercer JSE:[id number] DOB: 05-17-1945 DOA: 03/19/2017 PCP: Lawerance Cruel, MD  HPI/Recap of past 24 hours:  Dawn Mercer is a 72 y.o. year old female with medical history significant for chronic respiratory failure (due to COPD/emphysema), lung cancer, anxiety, GERD and hypothyroidism who presented on 03/19/2017 with progressive dyspnea over several weeks and acute worsening requiring increase in oxygen supplementation and was found to have acute on chronic respiratory failure    This morning, complains of burning in lower feet.  And also cramping in hands.  States slight improvement in breathing.  Denies any chest pain, fevers, cough or chills.  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  Down to 3 L of oxygen (on admission requiring 4 L, home requirements 2 L) will increase Lasix to 40 daily and expect improvement in diuresis.  We will continue to monitor weights, low-sodium diet.  Will touch base with patient's oncologist assist of the potential is progression of known recurrent non-small cell lung cancer (CT chest showing soft tissue density impressing on right pulmonary artery and associated tracheal bronchial tree).  Continue scheduled inhalers and supportive care for COPD as mentioned below.  COPD exacerbation. Scheduled duonebs, IC, DC IV steroids and start prednisone for the next 3 days, holding home inhaler therapy.  Doxycycline.   Acute CHFrEF exacerbation. TTE showed reduced EF, seems to be new diagnosis. Septal hypokinesis present.  Increased to Lasix 40 mg daily, daily weights, I/o's. Likely benefit from starting BB and Acei given reduced EF eventually, will continue to monitor  Pancytopenia, worsening. S/p Granix therapy by her oncologist who noted her low ANC cont on 1/16 ( prior to starting any chemotherapy).  Hemoglobin is dropped 1.8, leukocytosis worsening, platelets stable.  Currently not neutropenic.  Patient will be typed and screened.  Touch base with patient's oncologist.  Query any BM involvement in recurrent lung cancer. Monitor CBC  Recurrent NSCLC s/p chemoradiation over 4 years ago. Followed by Dr. Julien Nordmann. Scheduled to start 1st cycle of palliative chemotherapy 1/23 but was admitted instead.  Will discuss with patient's oncologist this may be progression of recurrent cancer, however we will continue to treat COPD/CHF exacerbation to see if any improvement in respiratory status  Hypothyroidism. Home synthroid  Code Status: Full code  Family Communication: Family updated at bedside  Disposition Plan: Improvement in respiratory status with IV diuresis and scheduled inhaler therapy.   Consultants:  None  Procedures:  TTE on 03/20/17,  Study Conclusions  - Left ventricle: The cavity size was normal. Wall thickness was   normal. Systolic function was mildly reduced. The estimated   ejection fraction was in the range of 45% to 50%. There is   hypokinesis of the septal myocardium. The study is not   technically sufficient to allow evaluation of LV diastolic   function. - Right ventricle: The cavity size was mildly dilated. Systolic   function was moderately reduced. - Pulmonary arteries: PA peak pressure: 35 mm Hg (S). - Pericardium, extracardiac:  A trivial pericardial effusion was   identified.  Impressions:  - Septal hypokinesis with overall mild LV dysfunction; mild  RVE   with moderate RV dysfunction.  Antimicrobials:  None  Cultures:  None  DVT prophylaxis: Lovenox   Objective: Vitals:   03/20/17 1952 03/20/17 2056 03/21/17 0543 03/21/17 1057  BP:  123/65 124/78   Pulse:  (!) 105 98 98  Resp:  20 20 17   Temp:  97.9 F (36.6 C) 97.6 F (36.4 C)   TempSrc:  Oral Oral   SpO2: 95% 97% 99% 99%  Weight:   83.6 kg (184 lb 4.9 oz)   Height:        Intake/Output Summary (Last 24 hours) at 03/21/2017 1307 Last data filed at 03/21/2017 0600 Gross per 24 hour  Intake 360 ml  Output -  Net 360 ml   Filed Weights   03/19/17 2123 03/20/17 0451 03/21/17 0543  Weight: 82.1 kg (181 lb) 82.5 kg (181 lb 12.8 oz) 83.6 kg (184 lb 4.9 oz)    Exam:  General: Sitting up in side of the bed, in no distress Eyes: EOMI, anicteric ENT: Moist oral mucosa Neck: no appreciable JVD Cardiovascular: regular rate and rhythm, no murmurs, rubs or gallops, trace lower extremity edema of bilateral lower legs,( nonpitting) Respiratory: Normal respiratory effort on 3 L [place], decreased breath sounds on right side,  no rales, wheezes or rhonchi Neurologic: Grossly no focal neuro deficit.Mental status AAOx3, speech normal, Psychiatric:Appropriate affect, and mood  Data Reviewed: CBC: Recent Labs  Lab 03/19/17 1213 03/21/17 0507  WBC 1.4* 1.2*  NEUTROABS 0.6* 0.8*  HGB 10.4* 9.2*  HCT 30.4* 27.0*  MCV 95.3 95.7  PLT 88* 90*   Basic Metabolic Panel: Recent Labs  Lab 03/19/17 1213 03/20/17 0506 03/21/17 0507  NA 140 138 136  K 3.5 3.6 4.7  CL 102 100* 100*  CO2 27 25 26   GLUCOSE 110* 162* 154*  BUN 18 18 24*  CREATININE 0.81 0.89 1.01*  CALCIUM 8.9 8.5* 8.4*   GFR: Estimated Creatinine Clearance: 55.3 mL/min (A) (by C-G formula based on SCr of 1.01 mg/dL (H)). Liver Function Tests: Recent Labs  Lab 03/19/17 1213  AST 19  ALT 13*  ALKPHOS 80  BILITOT 0.9  PROT 7.5  ALBUMIN 3.6   No results for input(s): LIPASE, AMYLASE in the last 168  hours. No results for input(s): AMMONIA in the last 168 hours. Coagulation Profile: No results for input(s): INR, PROTIME in the last 168 hours. Cardiac Enzymes: Recent Labs  Lab 03/19/17 1213  TROPONINI 0.04*   BNP (last 3 results) No results for input(s): PROBNP in the last 8760 hours. HbA1C: No results for input(s): HGBA1C in the last 72 hours. CBG: No results for input(s): GLUCAP in the last 168 hours. Lipid Profile: No results for input(s): CHOL, HDL, LDLCALC, TRIG, CHOLHDL, LDLDIRECT in the last 72 hours. Thyroid Function Tests: No results for input(s): TSH, T4TOTAL, FREET4, T3FREE, THYROIDAB in the last 72 hours. Anemia Panel: No results for input(s): VITAMINB12, FOLATE, FERRITIN, TIBC, IRON, RETICCTPCT in the last 72 hours. Urine analysis:    Component Value Date/Time   COLORURINE YELLOW 08/25/2016 1631   APPEARANCEUR CLEAR 08/25/2016 1631   LABSPEC 1.017 08/25/2016 1631   PHURINE 5.0 08/25/2016 1631   GLUCOSEU NEGATIVE 08/25/2016 1631   HGBUR NEGATIVE 08/25/2016 [place] NEGATIVE 08/25/2016 1631   KETONESUR NEGATIVE 08/25/2016 1631   PROTEINUR NEGATIVE 08/25/2016 1631   NITRITE NEGATIVE 08/25/2016 1631  LEUKOCYTESUR NEGATIVE 08/25/2016 1631   Sepsis Labs: @LABRCNTIP (procalcitonin:4,lacticidven:4)  )No results found for this or any previous visit (from the past 240 hour(s)).    Studies: No results found.  Scheduled Meds: . cholecalciferol  1,000 Units Oral Daily  . doxycycline  100 mg Oral Q12H  . enoxaparin (LOVENOX) injection  40 mg Subcutaneous Q24H  . [START ON 03/22/2017] furosemide  40 mg Intravenous Daily  . ipratropium-albuterol  3 mL Nebulization TID  . iron polysaccharides  150 mg Oral Daily  . levothyroxine  125 mcg Oral Once per day on Mon Tue Wed Thu Fri Sat  . [START ON 03/23/2017] levothyroxine  62.5 mcg Oral Q Sun  . pantoprazole  40 mg Oral Daily  . [START ON 03/22/2017] predniSONE  40 mg Oral Q breakfast  . sodium chloride  flush  3 mL Intravenous Q12H    Continuous Infusions: . sodium chloride       LOS: 2 days     Desiree Hane, MD [facility] Pager [phone]  If 7PM-7AM, please contact night-coverage [email or web address] Password [email or web address] 03/21/2017, 1:07 PM

## 2017-03-21 NOTE — Progress Notes (Signed)
Occupational Therapy Evaluation Patient Details Name: Dawn Mercer MRN: [id number] DOB: November 29, 1945 Today's Date: 03/21/2017    History of Present Illness  72 year old female with a past medical history significant for chronic respiratory failure (due to COPD/emphysema), lung cancer, anxiety, GERD and hypothyroidism: Who presented to the emergency department secondary to worsening shortness of breath and increase needs of oxygen supplementation.. Pt with progression of lung cancer, new onset CHF with reduced EF exacerbation, COPD exacerbation   Clinical Impression   PTA, pt living at home with husband. Pt was independent with ADL and mobility, although she states she was requiring more assistance for the last few weeks. Pt currently limited by 3/4 dyspnea with minimal activity. Pt unable to ambulate at this time due to dypsnea. Only able to complete stand pivot transfer to [facility]. Requires min to mod A with ADL at this time. Will follow acutely to maximize functional level of independence to facilitate safe DC home with husband.     Follow Up Recommendations  No OT follow up;Supervision - Intermittent    Equipment Recommendations  Other (comment);Toilet riser(Shower DME TBA by [facility] therapists due to size of showerstall)    Recommendations for Other Services       Precautions / Restrictions Precautions Precaution Comments: monitor O2 Restrictions Weight Bearing Restrictions: No      Mobility Bed Mobility               General bed mobility comments: sitting up on edge of bed  Transfers Overall transfer level: Needs assistance Equipment used: None Transfers: Stand Pivot Transfers;Sit to/from Stand Sit to Stand: Supervision Stand pivot transfers: Supervision       General transfer comment: SPT from bed to bedside commode then back to bed    Balance Overall balance assessment: No apparent balance deficits (not formally assessed)                                          ADL either performed or assessed with clinical judgement   ADL Overall ADL's : Needs assistance/impaired     Grooming: Minimal assistance;Sitting   Upper Body Bathing: Minimal assistance;Sitting   Lower Body Bathing: Moderate assistance;Sit to/from stand   Upper Body Dressing : Minimal assistance;Sitting   Lower Body Dressing: Moderate assistance;Sit to/from stand   Toilet Transfer: Minimal assistance;BSC;Stand-pivot   Toileting- Clothing Manipulation and Hygiene: Supervision/safety       Functional mobility during ADLs: Min guard(stand pivot) General ADL Comments: Pt ableot cross feet over knees for LB ADL. Pt currently limited by SOB. Unableto walk from hospital bed to bathroom at this time.Began education on energy conservation - handout provided     Vision         Perception     Praxis      Pertinent Vitals/Pain Pain Assessment: No/denies pain     Hand Dominance Right   Extremity/Trunk Assessment Upper Extremity Assessment Upper Extremity Assessment: Generalized weakness   Lower Extremity Assessment Lower Extremity Assessment: Defer to PT evaluation   Cervical / Trunk Assessment Cervical / Trunk Assessment: Normal   Communication Communication Communication: No difficulties   Cognition Arousal/Alertness: Awake/alert Behavior During Therapy: WFL for tasks assessed/performed Overall Cognitive Status: Within Functional Limits for tasks assessed  General Comments       Exercises     Shoulder Instructions      Home Living Family/patient expects to be discharged to:: Private residence Living Arrangements: Spouse/significant other Available Help at Discharge: Family;Available PRN/intermittently(husband works out of town at times) Type of Home: Mobile home Home Access: Stairs to enter [employer] of Steps: 4 Entrance Stairs-Rails: Right [place]: One level      Bathroom Shower/Tub: Walk-in shower("very small")   Bathroom Toilet: Standard Bathroom Accessibility: No   Home Equipment: [place] - single point;Bedside commode          Prior Functioning/Environment Level of Independence: Independent        Comments: walks short distances only due to SOB, wears 2.5-3L O2 24* at home, sponge bathes (sometimes husband helps)        OT Problem List: Decreased activity tolerance;Decreased knowledge of use of DME or AE      OT Treatment/Interventions: Self-care/ADL training;Energy conservation;Therapeutic activities;Patient/family education    OT Goals(Current goals can be found in the care plan section) Acute Rehab OT Goals Patient Stated Goal: be able to walk farther OT Goal Formulation: With patient Time For Goal Achievement: 04/04/17 Potential to Achieve Goals: Good  OT Frequency: Min 2X/week   Barriers to D/C:            Co-evaluation              AM-PAC PT "6 Clicks" Daily Activity     Outcome Measure Help from another person eating meals?: None Help from another person taking care of personal grooming?: A Little Help from another person toileting, which includes using toliet, bedpan, or urinal?: A Little Help from another person bathing (including washing, rinsing, drying)?: A Little Help from another person to put on and taking off regular upper body clothing?: A Little Help from another person to put on and taking off regular lower body clothing?: A Little 6 Click Score: 19   End of Session Nurse Communication: Mobility status  Activity Tolerance: Other (comment)(limited by dyspnea) Patient left: in bed;with call bell/phone within reach;with family/visitor present  OT Visit Diagnosis: Muscle weakness (generalized) (M62.81)                Time: 1450-1506 OT Time Calculation (min): 16 min Charges:  OT General Charges $OT Visit: 1 Visit OT Evaluation $OT Eval Low Complexity: 1 Low G-Codes:     Sapna Padron, OT/L   [phone] 03/21/2017  Blanche Gallien,HILLARY 03/21/2017, 3:19 PM

## 2017-03-21 NOTE — Progress Notes (Signed)
Explained to pt that ambulating pulse oximetry was needed to be done today. Pt refused at this time. Will pass along to next shift.

## 2017-03-21 NOTE — Care Management Note (Signed)
Case Management Note  Patient Details  Name: Dawn Mercer MRN: [id number] Date of Birth: 02/20/1946  Subjective/Objective:PT recc HHPT,rw. Patient chose [facility] rep Merril Abbe HHPT, & rw. Will check on home 02(if can change to another agency)                     Action/Plan:d/c plan home w/HHC/DME.   Expected Discharge Date:  (unknown)               Expected Discharge Plan:  [place]  In-House Referral:     Discharge planning Services  CM Consult  Post Acute Care Choice:  Durable Medical Equipment(Active w/home 02-Adult Pediatric Service) Choice offered to:  Patient  DME Arranged:  Walker rolling DME Agency:  [place]:  PT [facility] Agency:     Status of Service:  In process, will continue to follow  If discussed at Long Length of Stay Meetings, dates discussed:    Additional Comments:  Dessa Phi, RN 03/21/2017, 1:10 PM

## 2017-03-21 NOTE — Progress Notes (Signed)
Patient will get toilet riser on own.

## 2017-03-21 NOTE — Plan of Care (Signed)
  Clinical Measurements: Respiratory complications will improve 03/21/2017 2326 - Progressing by Ashley Murrain, RN

## 2017-03-21 NOTE — Progress Notes (Signed)
[facility] will be able to provide Home 02 for patient-please check 02 sats @ rest on ra,ambulating, then on 02.

## 2017-03-22 LAB — IRON AND TIBC
IRON: 110 ug/dL (ref 28–170)
Saturation Ratios: 38 % — ABNORMAL HIGH (ref 10.4–31.8)
TIBC: 288 ug/dL (ref 250–450)
UIBC: 178 ug/dL

## 2017-03-22 LAB — CBC WITH DIFFERENTIAL/PLATELET
BASOS PCT: 0 %
Basophils Absolute: 0 10*3/uL (ref 0.0–0.1)
Eosinophils Absolute: 0 10*3/uL (ref 0.0–0.7)
Eosinophils Relative: 1 %
HEMATOCRIT: 24.2 % — AB (ref 36.0–46.0)
HEMOGLOBIN: 8.5 g/dL — AB (ref 12.0–15.0)
LYMPHS PCT: 58 %
Lymphs Abs: 0.8 10*3/uL (ref 0.7–4.0)
MCH: 33.9 pg (ref 26.0–34.0)
MCHC: 35.1 g/dL (ref 30.0–36.0)
MCV: 96.4 fL (ref 78.0–100.0)
MONO ABS: 0.1 10*3/uL (ref 0.1–1.0)
MONOS PCT: 4 %
NEUTROS ABS: 0.5 10*3/uL — AB (ref 1.7–7.7)
NEUTROS PCT: 37 %
Platelets: 82 10*3/uL — ABNORMAL LOW (ref 150–400)
RBC: 2.51 MIL/uL — ABNORMAL LOW (ref 3.87–5.11)
RDW: 17.8 % — AB (ref 11.5–15.5)
WBC: 1.4 10*3/uL — CL (ref 4.0–10.5)

## 2017-03-22 LAB — BASIC METABOLIC PANEL
ANION GAP: 8 (ref 5–15)
BUN: 43 mg/dL — ABNORMAL HIGH (ref 6–20)
CALCIUM: 7.9 mg/dL — AB (ref 8.9–10.3)
CHLORIDE: 95 mmol/L — AB (ref 101–111)
CO2: 29 mmol/L (ref 22–32)
Creatinine, Ser: 1.06 mg/dL — ABNORMAL HIGH (ref 0.44–1.00)
GFR calc non Af Amer: 52 mL/min — ABNORMAL LOW (ref >60)
GLUCOSE: 89 mg/dL (ref 65–99)
Potassium: 3.3 mmol/L — ABNORMAL LOW (ref 3.5–5.1)
Sodium: 132 mmol/L — ABNORMAL LOW (ref 135–145)

## 2017-03-22 LAB — RETICULOCYTES
RBC.: 2.52 MIL/uL — ABNORMAL LOW (ref 3.87–5.11)
RETIC CT PCT: 2.2 % (ref 0.4–3.1)
Retic Count, Absolute: 55.4 10*3/uL (ref 19.0–186.0)

## 2017-03-22 LAB — SAVE SMEAR

## 2017-03-22 LAB — LACTATE DEHYDROGENASE: LDH: 190 U/L (ref 98–192)

## 2017-03-22 MED ORDER — UMECLIDINIUM BROMIDE 62.5 MCG/INH IN AEPB
1.0000 | INHALATION_SPRAY | Freq: Every day | RESPIRATORY_TRACT | Status: DC
  Filled 2017-03-22: qty 7

## 2017-03-22 MED ORDER — FUROSEMIDE 40 MG PO TABS
40.0000 mg | ORAL_TABLET | Freq: Every day | ORAL | Status: DC
  Administered 2017-03-23 – 2017-03-24 (×2): 40 mg via ORAL
  Filled 2017-03-22 (×2): qty 1

## 2017-03-22 MED ORDER — IPRATROPIUM-ALBUTEROL 0.5-2.5 (3) MG/3ML IN SOLN: 3.0000 mL | Freq: Four times a day (QID) | RESPIRATORY_TRACT | Status: DC | PRN

## 2017-03-22 MED ORDER — ARFORMOTEROL TARTRATE 15 MCG/2ML IN NEBU
15.0000 ug | INHALATION_SOLUTION | Freq: Two times a day (BID) | RESPIRATORY_TRACT | Status: DC
  Administered 2017-03-22 – 2017-03-24 (×4): 15 ug via RESPIRATORY_TRACT
  Filled 2017-03-22 (×3): qty 2

## 2017-03-22 MED ORDER — POTASSIUM CHLORIDE CRYS ER 20 MEQ PO TBCR
40.0000 meq | EXTENDED_RELEASE_TABLET | Freq: Once | ORAL | Status: AC
Start: 2017-03-22 — End: 2017-03-22
  Administered 2017-03-22: 40 meq via ORAL
  Filled 2017-03-22: qty 2

## 2017-03-22 NOTE — Progress Notes (Addendum)
PROGRESS NOTE  Dawn Mercer ZOX:[id number] DOB: 18-Jan-1946 DOA: 03/19/2017 PCP: Lawerance Cruel, MD  HPI/Recap of past 24 hours:  Dawn Mercer is a 72 y.o. year old female with medical history significant for chronic respiratory failure (due to COPD/emphysema), lung cancer, anxiety, GERD and hypothyroidism who presented on 03/19/2017 with progressive dyspnea over several weeks and acute worsening requiring increase in oxygen supplementation and was found to have acute on chronic respiratory failure    No complaints. 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  Down to 3 L of oxygen (on admission requiring 4 L, home requirements 2 L) will transition to PO Lasix 40mg   daily and monitor diuresis.  We will continue to monitor weights, low-sodium diet.   Decrease to PRN inhalers and resume home regimen and supportive care for COPD as mentioned below.  COPD exacerbation. PRN duonebs, IC, prednisone x 2 days, resume home inhaler therapy. Doxycycline x 2 days.   Acute CHFrEF exacerbation. TTE showed reduced EF, seems to be new diagnosis. Septal hypokinesis present.  Po Lasix 40 mg daily, daily weights, I/o's. Likely benefit from starting Acei given reduced EF eventually, will continue to monitor  Pancytopenia, worsening. S/p Granix therapy by her oncologist who noted her low ANC cont on 1/16 ( prior to starting any chemotherapy).   Hemoglobin is dropping, WBC and plts stable.  Query any BM involvement or hemolysis in recurrent lung cancer. Patient denies any bleeding F/u hemolytic labs ( retic, LDH, hapto), peripheral smear, pathologist review, iron profile. Of note patient takes iron for assumed iron def anemia. (addendum) D/c Prophylactic lovenox and start SCDs.   Recurrent NSCLC s/p chemoradiation over 4 years ago. Followed by Dr. Julien Nordmann. Scheduled to start 1st cycle of palliative chemotherapy 1/23 but was admitted instead.  Was unable to reach patient's oncologist. Less likely related to malignancy as improving with diuresis. Will continue to monitor  Hypothyroidism. Home synthroid  Code Status: Full code  Family Communication: Family updated at bedside  Disposition Plan: Improvement in respiratory status, monitor on oral lasix, home inhaler regimen, monitor hgb ( dropping)   Consultants:  None  Procedures:  TTE on 03/20/17  Study Conclusions  - Left ventricle: The cavity size was normal. Wall thickness was   normal. Systolic function was mildly reduced. The estimated   ejection fraction was in the range of 45% to 50%. There is   hypokinesis of the septal myocardium. The study is not   technically sufficient to allow evaluation of LV diastolic   function. - Right ventricle: The cavity size was mildly dilated. Systolic   function was moderately reduced. - Pulmonary arteries: PA peak pressure: 35 mm Hg (S). - Pericardium, extracardiac: A trivial pericardial effusion was   identified.  Impressions:  - Septal hypokinesis with overall mild LV dysfunction; mild RVE   with moderate RV dysfunction.  Antimicrobials:  None  Cultures:  None  DVT prophylaxis: Lovenox   Objective: Vitals:   03/22/17 0621 03/22/17 0908 03/22/17  1249 03/22/17 1442  BP: (!) 111/58  115/72   Pulse:   90   Resp:      Temp:   (!) 97.5 F (36.4 C)   TempSrc:   Oral   SpO2:  95% 98% 92%  Weight:      Height:         Intake/Output Summary (Last 24 hours) at 03/22/2017 1517 Last data filed at 03/22/2017 1200 Gross per 24 hour  Intake 360 ml  Output 402 ml  Net -42 ml   Filed Weights   03/20/17 0451 03/21/17 0543 03/22/17 0410  Weight: 82.5 kg (181 lb 12.8 oz) 83.6 kg (184 lb 4.9 oz) 82.5 kg (181 lb 14.1 oz)    Exam:  General:  in no distress Eyes: EOMI, anicteric Cardiovascular: regular rate and rhythm, no murmurs, rubs or gallops, trace lower extremity edema of bilateral lower legs,( nonpitting) Respiratory: Normal respiratory effort on 3 L [place], decreased breath sounds on right side,  no rales, wheezes or rhonchi Neurologic: Grossly no focal neuro deficit.Mental status AAOx3, speech normal, Psychiatric:Appropriate affect, and mood  Data Reviewed: CBC: Recent Labs  Lab 03/19/17 1213 03/21/17 0507 03/22/17 0517  WBC 1.4* 1.2* 1.4*  NEUTROABS 0.6* 0.8* 0.5*  HGB 10.4* 9.2* 8.5*  HCT 30.4* 27.0* 24.2*  MCV 95.3 95.7 96.4  PLT 88* 90* 82*   Basic Metabolic Panel: Recent Labs  Lab 03/19/17 1213 03/20/17 0506 03/21/17 0507 03/22/17 0517  NA 140 138 136 132*  K 3.5 3.6 4.7 3.3*  CL 102 100* 100* 95*  CO2 27 25 26 29   GLUCOSE 110* 162* 154* 89  BUN 18 18 24* 43*  CREATININE 0.81 0.89 1.01* 1.06*  CALCIUM 8.9 8.5* 8.4* 7.9*   GFR: Estimated Creatinine Clearance: 52.4 mL/min (A) (by C-G formula based on SCr of 1.06 mg/dL (H)). Liver Function Tests: Recent Labs  Lab 03/19/17 1213  AST 19  ALT 13*  ALKPHOS 80  BILITOT 0.9  PROT 7.5  ALBUMIN 3.6   No results for input(s): LIPASE, AMYLASE in the last 168 hours. No results for input(s): AMMONIA in the last 168 hours. Coagulation Profile: No results for input(s): INR, PROTIME in the last 168 hours. Cardiac Enzymes: Recent Labs  Lab 03/19/17 1213  TROPONINI 0.04*   BNP (last 3 results) No results for input(s): PROBNP in the last 8760 hours. HbA1C: No results for input(s): HGBA1C in the last 72 hours. CBG: No results  for input(s): GLUCAP in the last 168 hours. Lipid Profile: No results for input(s): CHOL, HDL, LDLCALC, TRIG, CHOLHDL, LDLDIRECT in the last 72 hours. Thyroid Function Tests: No results for input(s): TSH, T4TOTAL, FREET4, T3FREE, THYROIDAB in the last 72 hours. Anemia Panel: Recent Labs    03/22/17 0511 03/22/17 0920  TIBC  --  288  IRON  --  110  RETICCTPCT 2.2  --    Urine analysis:    Component Value Date/Time   COLORURINE YELLOW 08/25/2016 [place] 08/25/2016 1631   LABSPEC 1.017 08/25/2016 1631   PHURINE 5.0 08/25/2016 1631   GLUCOSEU NEGATIVE 08/25/2016 1631   HGBUR NEGATIVE 08/25/2016 1631   BILIRUBINUR NEGATIVE 08/25/2016 1631   KETONESUR NEGATIVE 08/25/2016 1631   PROTEINUR NEGATIVE 08/25/2016 1631   NITRITE NEGATIVE 08/25/2016 1631   LEUKOCYTESUR NEGATIVE 08/25/2016 1631   Sepsis Labs: @LABRCNTIP (procalcitonin:4,lacticidven:4)  )No results found for this or any previous visit (from the past 240 hour(s)).    Studies: No results found.  Scheduled Meds: . cholecalciferol  1,000 Units Oral Daily  . doxycycline  100 mg Oral Q12H  . enoxaparin (LOVENOX) injection  40 mg Subcutaneous Q24H  . [START ON 03/23/2017] furosemide  40 mg Oral Daily  . ipratropium-albuterol  3 mL Nebulization TID  . iron polysaccharides  150 mg Oral Daily  . levothyroxine  125 mcg Oral Once per day on Mon Tue Wed Thu Fri Sat  . [START ON 03/23/2017] levothyroxine  62.5 mcg Oral Q Sun  . pantoprazole  40 mg Oral Daily  . predniSONE  40 mg Oral Q breakfast  . sodium chloride flush  3 mL Intravenous Q12H    Continuous Infusions: . sodium chloride       LOS: 3 days     Desiree Hane, MD [facility] Pager [phone]  If 7PM-7AM, please contact night-coverage [email or web address] Password [facility] 03/22/2017, 3:17 PM

## 2017-03-23 LAB — BASIC METABOLIC PANEL
Anion gap: 8 (ref 5–15)
BUN: 38 mg/dL — AB (ref 6–20)
CALCIUM: 8.6 mg/dL — AB (ref 8.9–10.3)
CHLORIDE: 99 mmol/L — AB (ref 101–111)
CO2: 31 mmol/L (ref 22–32)
CREATININE: 0.9 mg/dL (ref 0.44–1.00)
Glucose, Bld: 88 mg/dL (ref 65–99)
Potassium: 4.2 mmol/L (ref 3.5–5.1)
SODIUM: 138 mmol/L (ref 135–145)

## 2017-03-23 LAB — CBC WITH DIFFERENTIAL/PLATELET
Basophils Absolute: 0 10*3/uL (ref 0.0–0.1)
Basophils Relative: 0 %
EOS ABS: 0 10*3/uL (ref 0.0–0.7)
EOS PCT: 1 %
HCT: 25.9 % — ABNORMAL LOW (ref 36.0–46.0)
HEMOGLOBIN: 8.9 g/dL — AB (ref 12.0–15.0)
Lymphocytes Relative: 56 %
Lymphs Abs: 0.9 10*3/uL (ref 0.7–4.0)
MCH: 33.3 pg (ref 26.0–34.0)
MCHC: 34.4 g/dL (ref 30.0–36.0)
MCV: 97 fL (ref 78.0–100.0)
MONO ABS: 0 10*3/uL — AB (ref 0.1–1.0)
Monocytes Relative: 3 %
NEUTROS PCT: 40 %
Neutro Abs: 0.6 10*3/uL — ABNORMAL LOW (ref 1.7–7.7)
PLATELETS: 89 10*3/uL — AB (ref 150–400)
RBC: 2.67 MIL/uL — AB (ref 3.87–5.11)
RDW: 17.6 % — ABNORMAL HIGH (ref 11.5–15.5)
WBC: 1.5 10*3/uL — AB (ref 4.0–10.5)

## 2017-03-23 LAB — HAPTOGLOBIN: HAPTOGLOBIN: 295 mg/dL — AB (ref 34–200)

## 2017-03-23 MED ORDER — UMECLIDINIUM BROMIDE 62.5 MCG/INH IN AEPB
1.0000 | INHALATION_SPRAY | Freq: Every day | RESPIRATORY_TRACT | Status: DC
  Administered 2017-03-23 – 2017-03-24 (×2): 1 via RESPIRATORY_TRACT

## 2017-03-23 MED ORDER — LISINOPRIL 5 MG PO TABS
5.0000 mg | ORAL_TABLET | Freq: Every day | ORAL | Status: DC
  Administered 2017-03-23: 5 mg via ORAL
  Filled 2017-03-23: qty 1

## 2017-03-23 MED ORDER — IPRATROPIUM-ALBUTEROL 0.5-2.5 (3) MG/3ML IN SOLN: 3.0000 mL | RESPIRATORY_TRACT | Status: DC | PRN

## 2017-03-23 MED ORDER — SODIUM CHLORIDE 0.9 % IV BOLUS (SEPSIS)
1000.0000 mL | Freq: Once | INTRAVENOUS | Status: AC
  Administered 2017-03-23: 1000 mL via INTRAVENOUS

## 2017-03-23 MED ORDER — GUAIFENESIN ER 600 MG PO TB12
600.0000 mg | ORAL_TABLET | Freq: Two times a day (BID) | ORAL | Status: DC | PRN
  Administered 2017-03-23: 600 mg via ORAL
  Filled 2017-03-23: qty 1

## 2017-03-23 NOTE — Progress Notes (Signed)
PROGRESS NOTE  Dawn Mercer TOI:[id number] DOB: 07/06/1945 DOA: 03/19/2017 PCP: Lawerance Cruel, MD  HPI/Recap of past 24 hours:  Dawn Mercer is a 72 y.o. year old female with medical history significant for chronic respiratory failure (due to COPD/emphysema), lung cancer, anxiety, GERD and hypothyroidism who presented on 03/19/2017 with progressive dyspnea over several weeks and acute worsening requiring increase in oxygen supplementation and was found to have acute on chronic respiratory failure    Notes continued improvement in breathing.  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  Stable on 3 L of oxygen (on admission requiring 4 L, home requirements 2 L) continue PO Lasix 40mg   daily and monitor diuresis (patient did receive IV Lasix on yesterday, so this will be the first day of only oral diuresis, we will need to monitor to ensure appropriate diuresis that will be continued on discharge).  We will continue to monitor weights, low-sodium diet.    PRN inhalers and  home inhaler regimen and supportive care for COPD as mentioned below.  Encouraged to ambulate to bedside chair.  COPD exacerbation. PRN duonebs, IC, prednisone, home inhaler therapy. Doxycycline to complete soon..   Acute CHFrEF exacerbation, improving. TTE showed reduced EF, seems to be new diagnosis. Septal hypokinesis present.  Po Lasix 40 mg daily, daily weights, I/o's. Likely  benefit from starting Acei given reduced EF so will start low dose lisinopril today, will continue to monitor. Will benefit from outpatient ischemic evaluation given reduced EF. Should add BB as outpatient given acute exacerbation  Pancytopenia, stable. S/p Granix therapy by her oncologist who noted her low ANC cont on 1/16 ( prior to starting any chemotherapy).  Hemolysis labs negative.  Patient does have a history of iron deficiency anemia for which she takes oral iron. continue SCDs  Recurrent NSCLC s/p chemoradiation over 4 years ago. Followed by Dr. Julien Nordmann. Scheduled to start 1st cycle of palliative chemotherapy 1/23 but was admitted instead.  Was unable to reach patient's oncologist. Less likely related to malignancy as improving with diuresis. Will continue to monitor  Hypothyroidism. Home synthroid  Code Status: Full code  Family Communication: Family updated at bedside  Disposition Plan: Improvement in respiratory status, monitor on oral lasix, home inhaler regimen, monitor hgb ( dropping), lisinopril, expect discharge in 24 hours.   Consultants:  None  Procedures:  TTE on 03/20/17  Study Conclusions  - Left ventricle: The cavity size was normal. Wall thickness was   normal. Systolic function was mildly reduced. The estimated   ejection fraction was in the range of 45% to 50%. There is   hypokinesis of the septal myocardium. The study is not   technically sufficient to allow evaluation of LV diastolic   function. - Right ventricle: The cavity size was mildly dilated. Systolic   function was moderately reduced. - Pulmonary arteries: PA peak pressure: 35 mm Hg (S). - Pericardium, extracardiac: A trivial pericardial effusion was   identified.  Impressions:  - Septal hypokinesis with overall mild LV  dysfunction; mild RVE   with moderate RV dysfunction.  Antimicrobials:  None  Cultures:  None  DVT prophylaxis: Lovenox   Objective: Vitals:   03/22/17 2107  03/22/17 2232 03/23/17 0612 03/23/17 1226  BP: 127/60  128/62   Pulse: (!) 101  81   Resp: 18  18   Temp: 99.1 F (37.3 C)  98 F (36.7 C)   TempSrc: Oral  Oral   SpO2: 96% 96% 96% 92%  Weight:      Height:        Intake/Output Summary (Last 24 hours) at 03/23/2017 1534 Last data filed at 03/23/2017 0900 Gross per 24 hour  Intake 720 ml  Output 100 ml  Net 620 ml   Filed Weights   03/20/17 0451 03/21/17 0543 03/22/17 0410  Weight: 82.5 kg (181 lb 12.8 oz) 83.6 kg (184 lb 4.9 oz) 82.5 kg (181 lb 14.1 oz)    Exam:  General:  in no distress Eyes: EOMI, anicteric Cardiovascular: regular rate and rhythm, no murmurs, rubs or gallops, trace lower extremity edema of bilateral lower legs,( nonpitting) Respiratory: Normal respiratory effort on 3 L [place], decreased breath sounds on right side,  no rales, wheezes or rhonchi Neurologic: Grossly no focal neuro deficit.Mental status AAOx3, speech normal, Psychiatric:Appropriate affect, and mood  Data Reviewed: CBC: Recent Labs  Lab 03/19/17 1213 03/21/17 0507 03/22/17 0517 03/23/17 0527  WBC 1.4* 1.2* 1.4* 1.5*  NEUTROABS 0.6* 0.8* 0.5* 0.6*  HGB 10.4* 9.2* 8.5* 8.9*  HCT 30.4* 27.0* 24.2* 25.9*  MCV 95.3 95.7 96.4 97.0  PLT 88* 90* 82* 89*   Basic Metabolic Panel: Recent Labs  Lab 03/19/17 1213 03/20/17 0506 03/21/17 0507 03/22/17 0517 03/23/17 0527  NA 140 138 136 132* 138  K 3.5 3.6 4.7 3.3* 4.2  CL 102 100* 100* 95* 99*  CO2 27 25 26 29 31   GLUCOSE 110* 162* 154* 89 88  BUN 18 18 24* 43* 38*  CREATININE 0.81 0.89 1.01* 1.06* 0.90  CALCIUM 8.9 8.5* 8.4* 7.9* 8.6*   GFR: Estimated Creatinine Clearance: 61.7 mL/min (by C-G formula based on SCr of 0.9 mg/dL). Liver Function Tests: Recent Labs  Lab 03/19/17 1213  AST 19  ALT 13*  ALKPHOS 80  BILITOT 0.9  PROT 7.5  ALBUMIN 3.6   No results for input(s): LIPASE, AMYLASE in the last 168 hours. No results for input(s): AMMONIA in the last 168 hours. Coagulation  Profile: No results for input(s): INR, PROTIME in the last 168 hours. Cardiac Enzymes: Recent Labs  Lab 03/19/17 1213  TROPONINI 0.04*   BNP (last 3 results) No results for input(s): PROBNP in the last 8760 hours. HbA1C: No results for input(s): HGBA1C in the last 72 hours. CBG: No results for input(s): GLUCAP in the last 168 hours. Lipid Profile: No results for input(s): CHOL, HDL, LDLCALC, TRIG, CHOLHDL, LDLDIRECT in the last 72 hours. Thyroid Function Tests: No results for input(s): TSH, T4TOTAL, FREET4, T3FREE, THYROIDAB in the last 72 hours. Anemia Panel: Recent Labs    03/22/17 0511 03/22/17 0920  TIBC  --  288  IRON  --  110  RETICCTPCT 2.2  --    Urine analysis:    Component Value Date/Time   COLORURINE YELLOW 08/25/2016 [place] 08/25/2016 1631   LABSPEC 1.017 08/25/2016 1631   PHURINE 5.0 08/25/2016 [place] 08/25/2016 1631   HGBUR NEGATIVE 08/25/2016 1631   BILIRUBINUR NEGATIVE 08/25/2016 1631   KETONESUR NEGATIVE 08/25/2016 1631  PROTEINUR NEGATIVE 08/25/2016 1631   NITRITE NEGATIVE 08/25/2016 1631   LEUKOCYTESUR NEGATIVE 08/25/2016 1631   Sepsis Labs: @LABRCNTIP (procalcitonin:4,lacticidven:4)  )No results found for this or any previous visit (from the past 240 hour(s)).    Studies: No results found.  Scheduled Meds: . arformoterol  15 mcg Nebulization BID  . cholecalciferol  1,000 Units Oral Daily  . doxycycline  100 mg Oral Q12H  . furosemide  40 mg Oral Daily  . iron polysaccharides  150 mg Oral Daily  . levothyroxine  125 mcg Oral Once per day on Mon Tue Wed Thu Fri Sat  . levothyroxine  62.5 mcg Oral Q Sun  . pantoprazole  40 mg Oral Daily  . predniSONE  40 mg Oral Q breakfast  . sodium chloride flush  3 mL Intravenous Q12H  . umeclidinium bromide  1 puff Inhalation Daily    Continuous Infusions: . sodium chloride       LOS: 4 days     Desiree Hane, MD [facility] Pager  [phone]  If 7PM-7AM, please contact night-coverage [email or web address] Password [facility] 03/23/2017, 3:34 PM

## 2017-03-24 LAB — CBC WITH DIFFERENTIAL/PLATELET
BASOS ABS: 0 10*3/uL (ref 0.0–0.1)
Basophils Relative: 0 %
EOS ABS: 0 10*3/uL (ref 0.0–0.7)
Eosinophils Relative: 1 %
HCT: 25.7 % — ABNORMAL LOW (ref 36.0–46.0)
Hemoglobin: 8.8 g/dL — ABNORMAL LOW (ref 12.0–15.0)
LYMPHS ABS: 1 10*3/uL (ref 0.7–4.0)
Lymphocytes Relative: 62 %
MCH: 33.2 pg (ref 26.0–34.0)
MCHC: 34.2 g/dL (ref 30.0–36.0)
MCV: 97 fL (ref 78.0–100.0)
MONO ABS: 0.1 10*3/uL (ref 0.1–1.0)
MONOS PCT: 3 %
Neutro Abs: 0.6 10*3/uL — ABNORMAL LOW (ref 1.7–7.7)
Neutrophils Relative %: 34 %
PLATELETS: 80 10*3/uL — AB (ref 150–400)
RBC: 2.65 MIL/uL — AB (ref 3.87–5.11)
RDW: 17.7 % — AB (ref 11.5–15.5)
WBC: 1.7 10*3/uL — AB (ref 4.0–10.5)

## 2017-03-24 LAB — BASIC METABOLIC PANEL
Anion gap: 9 (ref 5–15)
BUN: 42 mg/dL — AB (ref 6–20)
CO2: 27 mmol/L (ref 22–32)
CREATININE: 0.98 mg/dL (ref 0.44–1.00)
Calcium: 8.2 mg/dL — ABNORMAL LOW (ref 8.9–10.3)
Chloride: 101 mmol/L (ref 101–111)
GFR calc Af Amer: >60 mL/min (ref >60)
GFR, EST NON AFRICAN AMERICAN: 57 mL/min — AB (ref >60)
Glucose, Bld: 81 mg/dL (ref 65–99)
POTASSIUM: 3.7 mmol/L (ref 3.5–5.1)
SODIUM: 137 mmol/L (ref 135–145)

## 2017-03-24 MED ORDER — FUROSEMIDE 40 MG PO TABS: 40.0000 mg | ORAL_TABLET | Freq: Every day | ORAL | 0 refills | Status: DC

## 2017-03-24 MED ORDER — GUAIFENESIN ER 600 MG PO TB12: 600.0000 mg | ORAL_TABLET | Freq: Two times a day (BID) | ORAL | 0 refills | Status: AC | PRN

## 2017-03-24 NOTE — Progress Notes (Signed)
Physical Therapy Treatment Patient Details Name: Dawn Mercer MRN: [id number] DOB: 10/21/1945 Today's Date: 03/24/2017    History of Present Illness  72 year old female with a past medical history significant for chronic respiratory failure (due to COPD/emphysema), lung cancer, anxiety, GERD and hypothyroidism: Who presented to the emergency department secondary to worsening shortness of breath and increase needs of oxygen supplementation.. Pt with progression of lung cancer, new onset CHF with reduced EF exacerbation, COPD exacerbation    PT Comments    Pt continues to have fatigue and dyspnea with activity. Min assist needed to stabilize on today due to lightheadedness and fatigue. Discussed need for an assistive device-pt is agreeable. Recommend a 4 wheeled rolling walker to provide support while ambulating and to allow pt to sit and rest when necessary. Pt politely declined HHPT f/u.    Follow Up Recommendations  No PT follow up;Supervision for mobility/OOB (pt politely declines HHPT f/u)     Equipment Recommendations  4 wheeled rolling walker   Recommendations for Other Services        Precautions / Restrictions Precautions Precaution Comments: monitor O2 Restrictions Weight Bearing Restrictions: No    Mobility  Bed Mobility Overal bed mobility: Modified Independent                Transfers Overall transfer level: Needs assistance Equipment used: None Transfers: Sit to/from Stand Sit to Stand: Min guard         General transfer comment: close guard for safety. Pt c/o some mild lightheadedness  Ambulation/Gait Ambulation/Gait assistance: Min assist Ambulation Distance (Feet): 50 Feet Assistive device: None Gait Pattern/deviations: Step-through pattern;Decreased stride length     General Gait Details: Intermittent assist to steady. Slow gait speed. O2 sat 88% on 3L [name], dyspnea 3/4. distance limited by dyspnea, fatigue.    Stairs             Wheelchair Mobility    Modified Rankin (Stroke Patients Only)       Balance Overall balance assessment: Needs assistance           Standing balance-[name] Scale: Fair Standing balance comment: unsteady with ambulation on today. Pt think this may be due to medication                             Cognition Arousal/Alertness: Awake/alert Behavior During Therapy: WFL for tasks assessed/performed Overall Cognitive Status: Within Functional Limits for tasks assessed                                        Exercises      General Comments        Pertinent Vitals/Pain Pain Assessment: No/denies pain    Home Living                      Prior Function            PT Goals (current goals can now be found in the care plan section) Progress towards PT goals: Progressing toward goals    Frequency    Min 3X/week      PT Plan Current plan remains appropriate    Co-evaluation              AM-PAC PT "6 Clicks" Daily Activity  Outcome Measure  Difficulty turning over in bed (including adjusting bedclothes, sheets and blankets)?:  None Difficulty moving from lying on back to sitting on the side of the bed? : None Difficulty sitting down on and standing up from a chair with arms (e.g., wheelchair, bedside commode, etc,.)?: A Little Help needed moving to and from a bed to chair (including a wheelchair)?: A Little Help needed walking in hospital room?: A Little Help needed climbing 3-5 steps with a railing? : A Little 6 Click Score: 20    End of Session Equipment Utilized During Treatment: Oxygen Activity Tolerance: Patient limited by fatigue Patient left: in bed;with call bell/phone within reach;with family/visitor present   PT Visit Diagnosis: Difficulty in walking, not elsewhere classified (R26.2)     Time: 0623-7628 PT Time Calculation (min) (ACUTE ONLY): 20 min  Charges:  $Gait Training: 8-22 mins                     G Codes:          Dawn Mercer, MPT Pager: [phone]

## 2017-03-24 NOTE — Care Management Important Message (Signed)
Important Message  Patient Details  Name: [name] MRN: [id number] Date of Birth: 11-10-1945   Medicare Important Message Given:  Yes    Kerin Salen 03/24/2017, 12:42 [place] Message  Patient Details  Name: [name] MRN: [id number] Date of Birth: 16-Jul-1945   Medicare Important Message Given:  Yes    Kerin Salen 03/24/2017, 12:42 PM

## 2017-03-24 NOTE — Care Management Note (Signed)
Case Management Note  Patient Details  Name: Dawn Mercer MRN: [id number] Date of Birth: [birth year]  Subjective/Objective:Qualify for home 02-[facility] rep [place] Glad aware to deliver home 02 travel tank to rm prior d/c,rw. No further CM needs.                    Action/Plan:d/c home w/HHC/dme.   Expected Discharge Date:  (unknown)               Expected Discharge Plan:  [place]  In-House Referral:     Discharge planning Services  CM Consult  Post Acute Care Choice:  Durable Medical Equipment(Active w/home 02-Adult Pediatric Service) Choice offered to:  Patient  DME Arranged:  Walker rolling, Oxygen DME Agency:  [place]:  PT [facility] Agency:     Status of Service:  Completed, signed off  If discussed at [place] of Stay Meetings, dates discussed:    Additional Comments:  Dessa Phi, RN 03/24/2017, 10:49 AM

## 2017-03-24 NOTE — Discharge Summary (Signed)
Discharge Summary  Dawn Mercer TKZ:[id number] DOB: 05/18/1945  PCP: Lawerance Cruel, MD  Admit date: 03/19/2017 Discharge date: 03/24/2017  Time spent: < 25 minutes   Admitted From: Home Disposition:  Home  Recommendations for Outpatient Follow-up:  1. Follow up with PCP in 1-2 weeks 2. Please obtain CBC(wbc, hgb, plt)  in one week   Home Health:PT Equipment/Devices:Oxygen  Discharge Diagnoses:  Active Hospital Problems   Diagnosis Date Noted  . Acute on chronic respiratory failure with hypoxia ([place]) 06/16/2016  . Acute diastolic CHF (congestive heart failure) ([place]) 03/19/2017  . Bronchiectasis ([place]) 03/19/2017  . Pancytopenia ([place]) 03/19/2017  . Obesity (BMI 30-39.9) 08/27/2016  . Hypothyroidism 06/16/2016  . COPD with acute exacerbation ([place]) 06/16/2016  . 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    Discharge Condition: Stable  CODE STATUS:Full  Diet recommendation: Heart Healthy / Carb Modified / Regular / Dysphagia   Vitals:   03/24/17 0740 03/24/17 1326  BP:  (!) 104/56  Pulse:  75  Resp:  18  Temp:  98 F (36.7 C)  SpO2: 97% 100%    History of present illness:  Dawn Mercer is a 72 y.o. year old female with medical history significant for chronic respiratory failure ( home O2 2 L)due to COPD/emphysema,lung cancer, anxiety, GERD and hypothyroidism who presented on 03/19/2017 with progressive dyspnea over several weeks that acutely worsened requiring increase in oxygen supplementation and was found to have acute on chronic respiratory failure secondary to COPD exacerbation and new onset diastolic CHF exacerbation.  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  In setting of progressive dyspnea over several weeks differential included PE, pneumonia, CHF.  CTA chest on admission was negative for PE. BNP 175.  TTE was obtained which showed EF of 45-50% with septal hypokinesis (unfortunately study was insufficient to evaluate for diastolic dysfunction).  Given change in EF patient was treated as CHF exacerbation in addition to COPD exacerbation given worsening dyspnea.  Patient's breathing improved with IV Lasix that was then transitioned to oral Lasix with continued improvement.  Patient transition from IV scheduled duo nebs back to home regimen.  She was monitored over 24 hours and maintained improvement in her respiratory status.  On discharge patient required 3 L at rest for liters with exertion.    COPD exacerbation.   Initially treated with scheduled DuoNeb therapy, and Solu-Medrol.  Was transitioned to oral prednisone, oral doxycycline, and back to home regimen.  She completed oral prednisone therapy and doxycycline prior to discharge.   Acute CHFpEF exacerbation,.   New diagnosis for patient. Septal hypokinesis present patient will need outpatient ischemic evaluation.    Discharged on Po Lasix 40 mg daily.  Given EF not less than 40% patient does not need beta-blocker or ACE inhibitor.  Chronic pancytopenia, stable. S/p Granix therapy by her  oncologist who noted her low ANC cont on 1/16 ( prior to starting any chemotherapy).    Inpatient workup as initial hemoglobin on admission was 11.1 this continue to downtrend and stabilized at 8.8. Hemolysis labs negative. Panel: Iron 110, TIBC 288,  saturation was 38.  Ferritin level obtained as an outpatient 586. Patient apparently has a history of iron deficiency anemia for which she takes oral iron.   She was continued on SCDs  Recurrent NSCLC s/p chemoradiation over 4 years ago. Followed by Dr. Julien Nordmann. Scheduled to start 1st cycle of palliative chemotherapy 1/23 but was admitted instead.  Spoke with Dr. Julien Nordmann prior to discharge to let him know of her clinical status.   Hypothyroidism. Home synthroid     Consultations: None  Procedures/Studies: Ct Angio Chest Pe W And/or Wo Contrast  Result Date: 03/19/2017 CLINICAL DATA:  Dyspnea worsening since 10:15 a.m. patient has a known lung mass in the right upper lobe and was supposed to start chemotherapy today. EXAM: CT ANGIOGRAPHY CHEST WITH CONTRAST TECHNIQUE: Multidetector CT imaging of the chest was performed using the standard protocol during bolus administration of intravenous contrast. Multiplanar CT image reconstructions and MIPs were obtained to evaluate the vascular anatomy. CONTRAST:  113mL ISOVUE-370 IOPAMIDOL (ISOVUE-370) INJECTION 76% COMPARISON:  01/12/2017 FINDINGS: Cardiovascular: Normal 3 vessel branch pattern of the great vessels with atherosclerosis noted proximally. Aortic atherosclerosis is also noted without aneurysm or dissection. Good opacification of the pulmonary arteries. Mild dilatation of the main pulmonary artery to 3.2 cm compatible with changes of chronic pulmonary hypertension. No evidence of acute pulmonary embolus. Mild cardiomegaly without pericardial effusion. Coronary arteriosclerosis. Slight dextroversion of the heart due to volume loss on the right. There remains slight compression of the main right pulmonary artery secondary to post radiative change as well as obliteration of the right upper lobe pulmonary artery. Mediastinum/Nodes: No adenopathy at the base of the neck. No supraclavicular nor axillary lymphadenopathy. No thyromegaly or mass. There  remains soft tissue density emanating from the right hilum superiorly impressing upon the adjacent right pulmonary artery and tracheobronchial tree. This measures approximately 3.2 x 2.4 cm versus 3 x 2.3 cm previously and is without significant change. Stable right infrahilar and small subcarinal lymph nodes as before. The largest is subcarinal approximately 1.2 cm short axis. Lungs/Pleura: Stable centrilobular and paraseptal emphysematous change with volume loss on the right consistent with post treatment change and radiation fibrosis. Superimposed on emphysema are faint diffuse ground-glass opacities within both lungs that may represent a component of alveolitis or pneumonitis versus stigmata of mild pulmonary edema. No new focal infiltrate nor pulmonary nodules. Upper Abdomen: The included upper abdomen is nonacute. Musculoskeletal: Degenerative changes of the thoracic spine without acute compression deformity. Stable kyphoplasty at T5 with chronic stable superior endplate compression of T6. No aggressive osteolytic or blastic lesions. Review of the MIP images confirms the above findings. IMPRESSION: 1. No acute pulmonary embolus. Redemonstration of occluded right upper lobe pulmonary arterial branch likely secondary to post treatment change and fibrosis. 2. Stable masslike opacity about the right hilum measuring approximately 3.2 x 2.4 cm versus 3 x 2.3 cm previously, slightly impressing upon the adjacent right pulmonary artery and tracheobronchial tree. 3. Stable small right infrahilar and subcarinal lymph nodes. 4. Faint ground-glass opacities of the lungs superimposed on emphysema, nonspecific but may represent stigmata of mild pulmonary edema, alveolitis or pneumonitis, or an atypical infection among some possibilities. 5. Coronary arteriosclerosis. 6. T5 kyphoplasty with chronic superior endplate compression of T6. No  suspicious osseous lesions. Aortic Atherosclerosis (ICD10-I70.0) and Emphysema  (ICD10-J43.9). Electronically Signed   By: Ashley Royalty M.D.   On: 03/19/2017 15:04   Dg Chest Port 1 View  Result Date: 03/19/2017 CLINICAL DATA:  History lung cancer.  Decreased oxygen saturation EXAM: PORTABLE CHEST 1 VIEW COMPARISON:  CT chest 01/12/2017 FINDINGS: Stable right perihilar and upper lobe posttreatment changes. Right lung volume loss. Underlying COPD. No pleural effusion or pneumothorax. No focal consolidation. Stable cardiomediastinal silhouette. No acute osseous abnormality. Prior upper-mid thoracic vertebral body augmentation. IMPRESSION: No acute cardiopulmonary disease. Stable right perihilar and upper lobe posttreatment changes. Electronically Signed   By: Kathreen Devoid   On: 03/19/2017 12:42     Discharge Exam: BP (!) 104/56 (BP Location: Left Arm)   Pulse 75   Temp 98 F (36.7 C) (Oral)   Resp 18   Ht 5\' 5"  (1.651 m)   Wt 81.3 kg (179 lb 3.7 oz)   SpO2 100%   BMI 29.83 kg/m    General: lying in bed comfortably Cardiovascular: rrr, no m,r, or gallops. No peripheral edema  Respiratory: occasional rhonchi, minimal crackles at bases, no wheezing. On 3 L [place]   Discharge Instructions You were cared for by a hospitalist during your hospital stay. If you have any questions about your discharge medications or the care you received while you were in the hospital after you are discharged, you can call the unit and asked to speak with the hospitalist on call if the hospitalist that took care of you is not available. Once you are discharged, your primary care physician will handle any further medical issues. Please note that NO REFILLS for any discharge medications will be authorized once you are discharged, as it is imperative that you return to your primary care physician (or establish a relationship with a primary care physician if you do not have one) for your aftercare needs so that they can reassess your need for medications and monitor your lab values.  Discharge  Instructions    Diet - low sodium heart healthy   Complete by:  As directed    Increase activity slowly   Complete by:  As directed      Allergies as of 03/24/2017      Reactions   Latex Dermatitis   Codeine Nausea Only      Medication List    TAKE these medications   albuterol 108 (90 Base) MCG/ACT inhaler Commonly known as:  PROVENTIL HFA;VENTOLIN HFA Inhale 2 puffs into the lungs every 6 (six) hours as needed for wheezing or shortness of breath.   albuterol (2.5 MG/3ML) 0.083% nebulizer solution Commonly known as:  PROVENTIL Take 3 mLs (2.5 mg total) by nebulization every 6 (six) hours as needed for wheezing or shortness of breath.   ALPRAZolam 0.5 MG tablet Commonly known as:  XANAX Take 0.5 tablets (0.25 mg total) by mouth 2 (two) times daily as needed for anxiety.   aspirin EC 325 MG tablet Take 325 mg by mouth daily as needed for mild pain or moderate pain.   cyanocobalamin 1000 MCG/ML injection Commonly known as:  (VITAMIN B-12) Inject 1,000 mcg into the muscle every 30 (thirty) days.   furosemide 40 MG tablet Commonly known as:  LASIX Take 1 tablet (40 mg total) by mouth daily. Start taking on:  03/25/2017   Glycopyrrolate-Formoterol 9-4.8 MCG/ACT Aero Commonly known as:  BEVESPI AEROSPHERE Inhale 2 Inhalers into the lungs 2 (two) times daily. Inhale 2 puffs daily, rinse mouth  after use.   guaiFENesin 600 MG 12 hr tablet Commonly known as:  MUCINEX Take 1 tablet (600 mg total) by mouth 2 (two) times daily as needed for to loosen phlegm.   ipratropium-albuterol 0.5-2.5 (3) MG/3ML Soln Commonly known as:  DUONEB Take 3 mLs by nebulization every 4 (four) hours as needed.   iron polysaccharides 150 MG capsule Commonly known as:  NIFEREX Take 150 mg daily by mouth.   levothyroxine 125 MCG tablet Commonly known as:  SYNTHROID, LEVOTHROID Take 62.5-125 mcg by mouth See admin instructions. Take 125 mcg daily except Sundays take 62.5 mcg   OXYGEN 2lpm with  rest and 3lpm with exertion   RABEprazole 20 MG tablet Commonly known as:  ACIPHEX Take 30-60 min before first meal of the day What changed:    how much to take  how to take this  when to take this  additional instructions   Vitamin D3 1000 units Caps Take 1,000 Units by mouth daily.            Durable Medical Equipment  (From admission, onward)        Start     Ordered   03/24/17 1042  For home use only DME oxygen  Once    Question Answer Comment  Mode or (Route) Nasal cannula   Liters per Minute 3   Frequency Continuous (stationary and portable oxygen unit needed)   Oxygen delivery system Gas      01 /28/19 1042   03/24/17 1011  For home use only DME Walker rolling  Once    Comments:  4 wheeled rolling walker  Question:  Patient needs a walker to treat with the following condition  Answer:  Dyspnea   03/24/17 1011   03/21/17 1314  For home use only DME 4 wheeled rolling walker with seat  Once    Question:  Patient needs a walker to treat with the following condition  Answer:  Unsteady gait   03/21/17 1314     Allergies  Allergen Reactions  . Latex Dermatitis  . Codeine Nausea Only   Follow-up Information    [facility] Follow [facility].   Specialty:  Home Health Services Why:  [facility] [place] [facility] Contact information: [place] [address] [place] Follow up.   Why:  home 4 wheeled rolling walker, home oxygen Contact information: [address] [place] [address] [phone]            The results of significant diagnostics from this hospitalization (including imaging, microbiology, ancillary and laboratory) are listed below for reference.    Significant Diagnostic Studies: Ct Angio Chest Pe W And/or Wo Contrast  Result Date: 03/19/2017 CLINICAL DATA:  Dyspnea worsening since 10:15 a.m. patient has a known lung mass in the right upper lobe and was  supposed to start chemotherapy today. EXAM: CT ANGIOGRAPHY CHEST WITH CONTRAST TECHNIQUE: Multidetector CT imaging of the chest was performed using the standard protocol during bolus administration of intravenous contrast. Multiplanar CT image reconstructions and MIPs were obtained to evaluate the vascular anatomy. CONTRAST:  157mL ISOVUE-370 IOPAMIDOL (ISOVUE-370) INJECTION 76% COMPARISON:  01/12/2017 FINDINGS: Cardiovascular: Normal 3 vessel branch pattern of the great vessels with atherosclerosis noted proximally. Aortic atherosclerosis is also noted without aneurysm or dissection. Good opacification of the pulmonary arteries. Mild dilatation of the main pulmonary artery to 3.2 cm compatible with changes of chronic pulmonary hypertension. No  evidence of acute pulmonary embolus. Mild cardiomegaly without pericardial effusion. Coronary arteriosclerosis. Slight dextroversion of the heart due to volume loss on the right. There remains slight compression of the main right pulmonary artery secondary to post radiative change as well as obliteration of the right upper lobe pulmonary artery. Mediastinum/Nodes: No adenopathy at the base of the neck. No supraclavicular nor axillary lymphadenopathy. No thyromegaly or mass. There remains soft tissue density emanating from the right hilum superiorly impressing upon the adjacent right pulmonary artery and tracheobronchial tree. This measures approximately 3.2 x 2.4 cm versus 3 x 2.3 cm previously and is without significant change. Stable right infrahilar and small subcarinal lymph nodes as before. The largest is subcarinal approximately 1.2 cm short axis. Lungs/Pleura: Stable centrilobular and paraseptal emphysematous change with volume loss on the right consistent with post treatment change and radiation fibrosis. Superimposed on emphysema are faint diffuse ground-glass opacities within both lungs that may represent a component of alveolitis or pneumonitis versus stigmata of  mild pulmonary edema. No new focal infiltrate nor pulmonary nodules. Upper Abdomen: The included upper abdomen is nonacute. Musculoskeletal: Degenerative changes of the thoracic spine without acute compression deformity. Stable kyphoplasty at T5 with chronic stable superior endplate compression of T6. No aggressive osteolytic or blastic lesions. Review of the MIP images confirms the above findings. IMPRESSION: 1. No acute pulmonary embolus. Redemonstration of occluded right upper lobe pulmonary arterial branch likely secondary to post treatment change and fibrosis. 2. Stable masslike opacity about the right hilum measuring approximately 3.2 x 2.4 cm versus 3 x 2.3 cm previously, slightly impressing upon the adjacent right pulmonary artery and tracheobronchial tree. 3. Stable small right infrahilar and subcarinal lymph nodes. 4. Faint ground-glass opacities of the lungs superimposed on emphysema, nonspecific but may represent stigmata of mild pulmonary edema, alveolitis or pneumonitis, or an atypical infection among some possibilities. 5. Coronary arteriosclerosis. 6. T5 kyphoplasty with chronic superior endplate compression of T6. No suspicious osseous lesions. Aortic Atherosclerosis (ICD10-I70.0) and Emphysema (ICD10-J43.9). Electronically Signed   By: Ashley Royalty M.D.   On: 03/19/2017 15:04   Dg Chest Port 1 View  Result Date: 03/19/2017 CLINICAL DATA:  History lung cancer.  Decreased oxygen saturation EXAM: PORTABLE CHEST 1 VIEW COMPARISON:  CT chest 01/12/2017 FINDINGS: Stable right perihilar and upper lobe posttreatment changes. Right lung volume loss. Underlying COPD. No pleural effusion or pneumothorax. No focal consolidation. Stable cardiomediastinal silhouette. No acute osseous abnormality. Prior upper-mid thoracic vertebral body augmentation. IMPRESSION: No acute cardiopulmonary disease. Stable right perihilar and upper lobe posttreatment changes. Electronically Signed   By: Kathreen Devoid   On:  03/19/2017 12:42    Microbiology: No results found for this or any previous visit (from the past 240 hour(s)).   Labs: Basic Metabolic Panel: Recent Labs  Lab 03/20/17 0506 03/21/17 0507 03/22/17 0517 03/23/17 0527 03/24/17 0453  NA 138 136 132* 138 137  K 3.6 4.7 3.3* 4.2 3.7  CL 100* 100* 95* 99* 101  CO2 25 26 29 31 27   GLUCOSE 162* 154* 89 88 81  BUN 18 24* 43* 38* 42*  CREATININE 0.89 1.01* 1.06* 0.90 0.98  CALCIUM 8.5* 8.4* 7.9* 8.6* 8.2*   Liver Function Tests: Recent Labs  Lab 03/19/17 1213  AST 19  ALT 13*  ALKPHOS 80  BILITOT 0.9  PROT 7.5  ALBUMIN 3.6   No results for input(s): LIPASE, AMYLASE in the last 168 hours. No results for input(s): AMMONIA in the last 168 hours. CBC: Recent Labs  Lab 03/19/17 1213 03/21/17 0507 03/22/17 0517 03/23/17 0527 03/24/17 0453  WBC 1.4* 1.2* 1.4* 1.5* 1.7*  NEUTROABS 0.6* 0.8* 0.5* 0.6* 0.6*  HGB 10.4* 9.2* 8.5* 8.9* 8.8*  HCT 30.4* 27.0* 24.2* 25.9* 25.7*  MCV 95.3 95.7 96.4 97.0 97.0  PLT 88* 90* 82* 89* 80*   Cardiac Enzymes: Recent Labs  Lab 03/19/17 1213  TROPONINI 0.04*   BNP: BNP (last 3 results) Recent Labs    03/19/17 1213  BNP 175.3*    ProBNP (last 3 results) No results for input(s): PROBNP in the last 8760 hours.  CBG: No results for input(s): GLUCAP in the last 168 hours.     Signed:  Desiree Hane, MD [facility] 03/24/2017, 1:44 PM

## 2017-03-24 NOTE — Progress Notes (Signed)
OT Cancellation Note and Discharge  Patient Details Name: Dawn Mercer MRN: [id number] DOB: 05/12/1945   Cancelled Treatment:    Reason Eval/Treat Not Completed: Other (comment). Stopped by room to see if pt had her rollator yet (otherwise I was going to get ours for her to try) and she did. She said she already had used it once and she did not have any questions about it. She reports she would like a toilet riser--I informed her that insurance did not cover it and she said she thought she had an [place] book that she could order one from. I told her to make sure she knew if her toilets with round or elongated and to get the right one that fits her toilets. No further needs, we will sign off.  Almon Register [phone] 03/24/2017, 1:53 PM

## 2017-03-24 NOTE — Progress Notes (Signed)
SATURATION QUALIFICATIONS: (This note is used to comply with regulatory documentation for home oxygen)  Patient Saturations on Room Air at Rest = 89%  Patient Saturations on Room Air while Ambulating = 85%  Patient Saturations on 3 Liters of oxygen while Ambulating = 88%  Please briefly explain why patient needs home oxygen: Pt with lung CA, wearing O2 for the last 12 months.

## 2017-03-26 ENCOUNTER — Other Ambulatory Visit: Payer: Medicare Other

## 2017-03-30 LAB — ACID FAST CULTURE WITH REFLEXED SENSITIVITIES: ACID FAST CULTURE - AFSCU3: NEGATIVE

## 2017-03-31 ENCOUNTER — Telehealth: Payer: Self-pay | Admitting: Medical Oncology

## 2017-03-31 NOTE — Telephone Encounter (Signed)
I returned pt call . Appts given for this week and next.

## 2017-04-02 ENCOUNTER — Telehealth: Payer: Self-pay | Admitting: Medical Oncology

## 2017-04-02 ENCOUNTER — Inpatient Hospital Stay: Payer: Medicare Other | Attending: Internal Medicine

## 2017-04-02 DIAGNOSIS — Z9221 Personal history of antineoplastic chemotherapy: Secondary | ICD-10-CM | POA: Insufficient documentation

## 2017-04-02 DIAGNOSIS — K219 Gastro-esophageal reflux disease without esophagitis: Secondary | ICD-10-CM | POA: Insufficient documentation

## 2017-04-02 DIAGNOSIS — D708 Other neutropenia: Secondary | ICD-10-CM

## 2017-04-02 DIAGNOSIS — K589 Irritable bowel syndrome without diarrhea: Secondary | ICD-10-CM | POA: Diagnosis not present

## 2017-04-02 DIAGNOSIS — J441 Chronic obstructive pulmonary disease with (acute) exacerbation: Secondary | ICD-10-CM | POA: Diagnosis not present

## 2017-04-02 DIAGNOSIS — I509 Heart failure, unspecified: Secondary | ICD-10-CM | POA: Insufficient documentation

## 2017-04-02 DIAGNOSIS — C3411 Malignant neoplasm of upper lobe, right bronchus or lung: Secondary | ICD-10-CM | POA: Insufficient documentation

## 2017-04-02 DIAGNOSIS — Z923 Personal history of irradiation: Secondary | ICD-10-CM | POA: Diagnosis not present

## 2017-04-02 DIAGNOSIS — Z79899 Other long term (current) drug therapy: Secondary | ICD-10-CM | POA: Diagnosis not present

## 2017-04-02 DIAGNOSIS — R5382 Chronic fatigue, unspecified: Secondary | ICD-10-CM

## 2017-04-02 DIAGNOSIS — E039 Hypothyroidism, unspecified: Secondary | ICD-10-CM | POA: Diagnosis not present

## 2017-04-02 DIAGNOSIS — F419 Anxiety disorder, unspecified: Secondary | ICD-10-CM | POA: Insufficient documentation

## 2017-04-02 DIAGNOSIS — D61818 Other pancytopenia: Secondary | ICD-10-CM | POA: Diagnosis not present

## 2017-04-02 LAB — CMP (CANCER CENTER ONLY)
ALBUMIN: 3.2 g/dL — AB (ref 3.5–5.0)
ALK PHOS: 86 U/L (ref 40–150)
ALT: 17 U/L (ref 0–55)
ANION GAP: 15 — AB (ref 3–11)
AST: 17 U/L (ref 5–34)
BUN: 37 mg/dL — AB (ref 7–26)
CO2: 24 mmol/L (ref 22–29)
Calcium: 9.5 mg/dL (ref 8.4–10.4)
Chloride: 101 mmol/L (ref 98–109)
Creatinine: 0.94 mg/dL (ref 0.60–1.10)
GFR, Est AFR Am: >60 mL/min (ref >60)
GFR, Estimated: >60 mL/min (ref >60)
GLUCOSE: 144 mg/dL — AB (ref 70–140)
Potassium: 3.2 mmol/L — ABNORMAL LOW (ref 3.5–5.1)
SODIUM: 140 mmol/L (ref 136–145)
Total Bilirubin: 0.8 mg/dL (ref 0.2–1.2)
Total Protein: 7.2 g/dL (ref 6.4–8.3)

## 2017-04-02 LAB — CBC WITH DIFFERENTIAL (CANCER CENTER ONLY)
Basophils Absolute: 0 10*3/uL (ref 0.0–0.1)
Basophils Relative: 1 %
EOS PCT: 3 %
Eosinophils Absolute: 0 10*3/uL (ref 0.0–0.5)
HCT: 27.9 % — ABNORMAL LOW (ref 34.8–46.6)
Hemoglobin: 9.6 g/dL — ABNORMAL LOW (ref 11.6–15.9)
LYMPHS PCT: 48 %
Lymphs Abs: 0.5 10*3/uL — ABNORMAL LOW (ref 0.9–3.3)
MCH: 33.4 pg (ref 25.1–34.0)
MCHC: 34.4 g/dL (ref 31.5–36.0)
MCV: 97.1 fL (ref 79.5–101.0)
MONO ABS: 0 10*3/uL — AB (ref 0.1–0.9)
MONOS PCT: 5 %
Neutro Abs: 0.4 10*3/uL — CL (ref 1.5–6.5)
Neutrophils Relative %: 43 %
PLATELETS: 103 10*3/uL — AB (ref 145–400)
RBC: 2.87 MIL/uL — ABNORMAL LOW (ref 3.70–5.45)
RDW: 18.9 % — AB (ref 11.2–14.5)
WBC Count: 0.9 10*3/uL — CL (ref 3.9–10.3)

## 2017-04-02 LAB — TSH: TSH: 0.255 u[IU]/mL — ABNORMAL LOW (ref 0.308–3.960)

## 2017-04-02 NOTE — Telephone Encounter (Signed)
Reviewed cbc with pt and instructed her on neutropenic precautions.

## 2017-04-08 ENCOUNTER — Telehealth: Payer: Self-pay | Admitting: Medical Oncology

## 2017-04-08 NOTE — Telephone Encounter (Signed)
Weakness -"cannot even stand up". Reports dizziness. I instructed pt to go to ED.

## 2017-04-09 ENCOUNTER — Encounter: Payer: Self-pay | Admitting: Nurse Practitioner

## 2017-04-09 ENCOUNTER — Inpatient Hospital Stay (HOSPITAL_BASED_OUTPATIENT_CLINIC_OR_DEPARTMENT_OTHER): Payer: Medicare Other | Admitting: Nurse Practitioner

## 2017-04-09 ENCOUNTER — Telehealth: Payer: Self-pay | Admitting: *Deleted

## 2017-04-09 ENCOUNTER — Inpatient Hospital Stay: Payer: Medicare Other

## 2017-04-09 VITALS — BP 94/53 | HR 98 | Temp 97.6°F | Resp 20 | Ht 65.0 in

## 2017-04-09 DIAGNOSIS — Z923 Personal history of irradiation: Secondary | ICD-10-CM

## 2017-04-09 DIAGNOSIS — C3401 Malignant neoplasm of right main bronchus: Secondary | ICD-10-CM

## 2017-04-09 DIAGNOSIS — Z79899 Other long term (current) drug therapy: Secondary | ICD-10-CM | POA: Diagnosis not present

## 2017-04-09 DIAGNOSIS — C3411 Malignant neoplasm of upper lobe, right bronchus or lung: Secondary | ICD-10-CM

## 2017-04-09 DIAGNOSIS — Z9221 Personal history of antineoplastic chemotherapy: Secondary | ICD-10-CM

## 2017-04-09 DIAGNOSIS — D61818 Other pancytopenia: Secondary | ICD-10-CM

## 2017-04-09 DIAGNOSIS — D708 Other neutropenia: Secondary | ICD-10-CM

## 2017-04-09 LAB — CBC WITH DIFFERENTIAL (CANCER CENTER ONLY)
BASOS ABS: 0 10*3/uL (ref 0.0–0.1)
BASOS PCT: 1 %
EOS ABS: 0.1 10*3/uL (ref 0.0–0.5)
Eosinophils Relative: 5 %
HCT: 23.7 % — ABNORMAL LOW (ref 34.8–46.6)
Hemoglobin: 8 g/dL — ABNORMAL LOW (ref 11.6–15.9)
Lymphocytes Relative: 55 %
Lymphs Abs: 0.6 10*3/uL — ABNORMAL LOW (ref 0.9–3.3)
MCH: 32.7 pg (ref 25.1–34.0)
MCHC: 33.8 g/dL (ref 31.5–36.0)
MCV: 96.7 fL (ref 79.5–101.0)
Monocytes Absolute: 0.1 10*3/uL (ref 0.1–0.9)
Monocytes Relative: 7 %
NEUTROS PCT: 32 %
NRBC: 0 /100{WBCs}
Neutro Abs: 0.3 10*3/uL — CL (ref 1.5–6.5)
PLATELETS: 73 10*3/uL — AB (ref 145–400)
RBC: 2.45 MIL/uL — AB (ref 3.70–5.45)
RDW: 17.1 % — ABNORMAL HIGH (ref 11.2–14.5)
WBC: 1 10*3/uL — AB (ref 3.9–10.3)

## 2017-04-09 LAB — SAMPLE TO BLOOD BANK

## 2017-04-09 LAB — CMP (CANCER CENTER ONLY)
ALT: 23 U/L (ref 0–55)
AST: 18 U/L (ref 5–34)
Albumin: 3.1 g/dL — ABNORMAL LOW (ref 3.5–5.0)
Alkaline Phosphatase: 88 U/L (ref 40–150)
Anion gap: 15 — ABNORMAL HIGH (ref 3–11)
BUN: 33 mg/dL — AB (ref 7–26)
CHLORIDE: 101 mmol/L (ref 98–109)
CO2: 24 mmol/L (ref 22–29)
CREATININE: 0.92 mg/dL (ref 0.60–1.10)
Calcium: 9.3 mg/dL (ref 8.4–10.4)
GFR, Estimated: >60 mL/min (ref >60)
Glucose, Bld: 93 mg/dL (ref 70–140)
Potassium: 3.5 mmol/L (ref 3.5–5.1)
SODIUM: 140 mmol/L (ref 136–145)
Total Bilirubin: 1.3 mg/dL — ABNORMAL HIGH (ref 0.2–1.2)
Total Protein: 7.1 g/dL (ref 6.4–8.3)

## 2017-04-09 LAB — PREPARE RBC (CROSSMATCH)

## 2017-04-09 MED ORDER — ALPRAZOLAM 0.5 MG PO TABS: 0.2500 mg | ORAL_TABLET | Freq: Two times a day (BID) | ORAL | 0 refills | Status: AC | PRN

## 2017-04-09 MED ORDER — ACETAMINOPHEN 325 MG PO TABS
ORAL_TABLET | ORAL | Status: AC
Start: 2017-04-09 — End: 2017-04-09
  Filled 2017-04-09: qty 2

## 2017-04-09 MED ORDER — SODIUM CHLORIDE 0.9 % IV SOLN
250.0000 mL | Freq: Once | INTRAVENOUS | Status: AC
  Administered 2017-04-09: 250 mL via INTRAVENOUS

## 2017-04-09 MED ORDER — DIPHENHYDRAMINE HCL 25 MG PO CAPS
25.0000 mg | ORAL_CAPSULE | Freq: Once | ORAL | Status: AC
  Administered 2017-04-09: 25 mg via ORAL

## 2017-04-09 MED ORDER — TBO-FILGRASTIM 480 MCG/0.8ML ~~LOC~~ SOSY
480.0000 ug | PREFILLED_SYRINGE | Freq: Once | SUBCUTANEOUS | Status: DC
  Administered 2017-04-09: 480 ug via SUBCUTANEOUS

## 2017-04-09 MED ORDER — TBO-FILGRASTIM 480 MCG/0.8ML ~~LOC~~ SOSY: 480.0000 ug | PREFILLED_SYRINGE | Freq: Once | SUBCUTANEOUS | Status: DC

## 2017-04-09 MED ORDER — TBO-FILGRASTIM 480 MCG/0.8ML ~~LOC~~ SOSY
PREFILLED_SYRINGE | SUBCUTANEOUS | Status: AC
  Filled 2017-04-09: qty 0.8

## 2017-04-09 MED ORDER — DIPHENHYDRAMINE HCL 25 MG PO CAPS
ORAL_CAPSULE | ORAL | Status: AC
  Filled 2017-04-09: qty 1

## 2017-04-09 MED ORDER — ACETAMINOPHEN 325 MG PO TABS
650.0000 mg | ORAL_TABLET | Freq: Once | ORAL | Status: AC
  Administered 2017-04-09: 650 mg via ORAL

## 2017-04-09 NOTE — Telephone Encounter (Signed)
Called pt to request she come in earlier for her treatment today. She will try, she stated it takes an hour to get here and she just woke up. Pt also stated she has "a little bit of neuropathy" in her hands and feet. Asked if treatment will make it worse, informed her this will be discussed during office visit.

## 2017-04-09 NOTE — Progress Notes (Addendum)
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  PRIOR THERAPY:  1) Concurrent chemoradiation with weekly carboplatin for AUC of 2 and paclitaxel 45 mg/M2, status post 8 cycles, last dose was given 03/15/2013 with partial response. 2) Consolidation chemotherapy with carboplatin for AUC of 5 and paclitaxel 175 mg/M2 every 3 weeks with Neulasta support, status post 3 cycles. First dose 05/03/2013. Last dose was given 06/14/2013.  CURRENT THERAPY: Systemic chemotherapy with carboplatin for AUC of 5, paclitaxel 175 mg/M2 and Keytruda 200 mg IV every 3 weeks.       INTERVAL HISTORY:   Ms. Blas returns for follow-up.  She was scheduled to begin chemotherapy with carboplatin/Taxol/Pembrolizumab 03/19/2017.  She was hospitalized 03/19/2017 through 03/24/2017.  She was treated for a CHF exacerbation in addition to COPD exacerbation with improvement.  She was discharged home on 3 L of supplemental oxygen.  She reports no improvement in her breathing since hospital discharge.  She continue supplemental oxygen at 3 L.  She denies fever.  No shaking chills.  No bleeding.  Appetite is poor.  She notes weight loss.  She is weak.  She denies pain.  No cough.  She is anxious.  Objective:  Vital signs in last 24 hours:  Blood pressure (!) 94/53, pulse 98, temperature 97.6 F (36.4 C), temperature source Oral, resp. rate 20, height 5\' 5"  (1.651 m), SpO2 100 %.    HEENT: No thrush or ulcers. Resp: Lungs with bilateral wheezes.  No respiratory distress. Cardio: Regular rate and rhythm. GI: Abdomen soft and nontender.  No hepatomegaly. Vascular: No leg edema. Neuro: Alert and oriented.   Lab Results:  Lab Results  Component Value  Date   WBC 1.0 (L) 04/09/2017   HGB 8.8 (L) 03/24/2017   HCT 23.7 (L) 04/09/2017   MCV 96.7 04/09/2017   PLT 73 (L) 04/09/2017   NEUTROABS 0.3 (LL) 04/09/2017    Imaging:  No results found.  Medications: I have reviewed the patient's current medications.  Assessment/Plan: 1. Recurrent non-small cell lung cancer initially diagnosed as stage IIIA non-small cell lung cancer status post a course of concurrent chemoradiation followed by consolidation chemotherapy with carboplatin and paclitaxel; she has been on observation for close to 4 years; recent CT scan of the chest showed increasing soft tissue in the right hilum suspicious for disease recurrence.  Status post bronchoscopy under the care of Dr. Lake Bells with final pathology consistent with recurrent non-small cell lung cancer, squamous cell carcinoma. 2. Pancytopenia, etiology unclear. 3. Recent hospitalization with CHF exacerbation, COPD exacerbation    Disposition: Ms. Huie is a 72 year old woman with recurrent non-small cell lung cancer.  She was scheduled to begin chemotherapy on 03/19/2017 but was hospitalized with CHF/COPD exacerbations.  She presents today prior to beginning cycle 1 carboplatin/Taxol/Pembrolizumab.  She is noted to have progressive pancytopenia of unclear etiology.  Chemotherapy being held due to the pancytopenia.  Dr. Julien Nordmann recommends Granix 480 mcg today and tomorrow; blood transfusion 04/10/2017.  Lab and follow-up in 1 week.  Ms. Fairbank understands to contact the office with fever, chills, other signs of infection, bleeding.  She was provided with a Xanax prescription 0.25 mg twice daily as needed for anxiety.  Patient seen with Dr. Julien Nordmann.    Ned Card ANP/GNP-BC  04/09/2017  1:39 PM   ADDENDUM: Hematology/Oncology Attending: I had a face-to-face encounter with the patient today.  I recommended her care plan.  This is a very pleasant 72 years old white female with metastatic non-small  cell lung cancer.  She was supposed to start the first cycle of systemic chemotherapy with carboplatin, paclitaxel and Keytruda today.  Unfortunately the patient was admitted recently to [facility] with congestive heart failure and COPD exacerbation.  She was also found to have pancytopenia during her hospitalization and she received Granix at that time.  She presented to the clinic today for evaluation before starting the first cycle of her treatment and she was found to have pancytopenia.  The etiology is unclear. I recommended for the patient to delay the start of the chemotherapy by 1 week until improvement of her count. For the anemia of chronic disease, we will arrange for the patient to receive PRBCs transfusion. For the neutropenia, will arrange for the patient to receive Granix 480 mcg subcutaneously today and tomorrow. For the thrombocytopenia, we will continue to monitor her closely for now. For anxiety we gave the patient refill of Xanax.  She also received a refill of hydrocodone. The patient will come back for follow-up visit in 1 week for reevaluation before resuming her treatment. She was advised to call immediately if she has any concerning symptoms in the interval.  Disclaimer: This note was dictated with voice recognition software. Similar sounding words can inadvertently be transcribed and may be missed upon review. Eilleen Kempf, MD 04/09/17

## 2017-04-09 NOTE — Patient Instructions (Signed)

## 2017-04-10 ENCOUNTER — Other Ambulatory Visit: Payer: Self-pay | Admitting: *Deleted

## 2017-04-10 ENCOUNTER — Inpatient Hospital Stay: Payer: Medicare Other

## 2017-04-10 DIAGNOSIS — C3411 Malignant neoplasm of upper lobe, right bronchus or lung: Secondary | ICD-10-CM

## 2017-04-10 DIAGNOSIS — C3410 Malignant neoplasm of upper lobe, unspecified bronchus or lung: Secondary | ICD-10-CM

## 2017-04-10 DIAGNOSIS — D708 Other neutropenia: Secondary | ICD-10-CM

## 2017-04-10 DIAGNOSIS — C3401 Malignant neoplasm of right main bronchus: Secondary | ICD-10-CM

## 2017-04-10 LAB — ABO/RH: ABO/RH(D): O POS

## 2017-04-10 LAB — PREPARE RBC (CROSSMATCH)

## 2017-04-10 MED ORDER — TBO-FILGRASTIM 480 MCG/0.8ML ~~LOC~~ SOSY
480.0000 ug | PREFILLED_SYRINGE | Freq: Once | SUBCUTANEOUS | Status: AC
  Administered 2017-04-10: 480 ug via SUBCUTANEOUS

## 2017-04-10 MED ORDER — DIPHENHYDRAMINE HCL 25 MG PO CAPS
25.0000 mg | ORAL_CAPSULE | Freq: Once | ORAL | Status: AC
  Administered 2017-04-10: 25 mg via ORAL

## 2017-04-10 MED ORDER — SODIUM CHLORIDE 0.9 % IV SOLN
250.0000 mL | Freq: Once | INTRAVENOUS | Status: AC
  Administered 2017-04-10: 250 mL via INTRAVENOUS

## 2017-04-10 MED ORDER — DIPHENHYDRAMINE HCL 25 MG PO CAPS
ORAL_CAPSULE | ORAL | Status: AC
  Filled 2017-04-10: qty 1

## 2017-04-10 MED ORDER — TBO-FILGRASTIM 480 MCG/0.8ML ~~LOC~~ SOSY
PREFILLED_SYRINGE | SUBCUTANEOUS | Status: AC
  Filled 2017-04-10: qty 0.8

## 2017-04-10 MED ORDER — HEPARIN SOD (PORK) LOCK FLUSH 100 UNIT/ML IV SOLN
500.0000 [IU] | Freq: Every day | INTRAVENOUS | Status: DC | PRN
  Filled 2017-04-10: qty 5

## 2017-04-10 MED ORDER — ACETAMINOPHEN 325 MG PO TABS
650.0000 mg | ORAL_TABLET | Freq: Once | ORAL | Status: AC
  Administered 2017-04-10: 650 mg via ORAL

## 2017-04-10 MED ORDER — ACETAMINOPHEN 325 MG PO TABS
ORAL_TABLET | ORAL | Status: AC
  Filled 2017-04-10: qty 2

## 2017-04-10 MED ORDER — PEGFILGRASTIM INJECTION 6 MG/0.6ML ~~LOC~~: 6.0000 mg | PREFILLED_SYRINGE | Freq: Once | SUBCUTANEOUS | Status: DC

## 2017-04-10 NOTE — Patient Instructions (Signed)
Pegfilgrastim injection What is this medicine? PEGFILGRASTIM (PEG fil gra stim) is a long-acting granulocyte colony-stimulating factor that stimulates the growth of neutrophils, a type of white blood cell important in the body's fight against infection. It is used to reduce the incidence of fever and infection in patients with certain types of cancer who are receiving chemotherapy that affects the bone marrow, and to increase survival after being exposed to high doses of radiation. This medicine may be used for other purposes; ask your health care provider or pharmacist if you have questions. COMMON BRAND NAME(S): Neulasta What should I tell my health care provider before I take this medicine? They need to know if you have any of these conditions: -kidney disease -latex allergy -ongoing radiation therapy -sickle cell disease -skin reactions to acrylic adhesives (On-Body Injector only) -an unusual or allergic reaction to pegfilgrastim, filgrastim, other medicines, foods, dyes, or preservatives -pregnant or trying to get pregnant -breast-feeding How should I use this medicine? This medicine is for injection under the skin. If you get this medicine at home, you will be taught how to prepare and give the pre-filled syringe or how to use the On-body Injector. Refer to the patient Instructions for Use for detailed instructions. Use exactly as directed. Tell your healthcare provider immediately if you suspect that the On-body Injector may not have performed as intended or if you suspect the use of the On-body Injector resulted in a missed or partial dose. It is important that you put your used needles and syringes in a special sharps container. Do not put them in a trash can. If you do not have a sharps container, call your pharmacist or healthcare provider to get one. Talk to your pediatrician regarding the use of this medicine in children. While this drug may be prescribed for selected conditions,  precautions do apply. Overdosage: If you think you have taken too much of this medicine contact a poison control center or emergency room at once. NOTE: This medicine is only for you. Do not share this medicine with others. What if I miss a dose? It is important not to miss your dose. Call your doctor or health care professional if you miss your dose. If you miss a dose due to an On-body Injector failure or leakage, a new dose should be administered as soon as possible using a single prefilled syringe for manual use. What may interact with this medicine? Interactions have not been studied. Give your health care provider a list of all the medicines, herbs, non-prescription drugs, or dietary supplements you use. Also tell them if you smoke, drink alcohol, or use illegal drugs. Some items may interact with your medicine. This list may not describe all possible interactions. Give your health care provider a list of all the medicines, herbs, non-prescription drugs, or dietary supplements you use. Also tell them if you smoke, drink alcohol, or use illegal drugs. Some items may interact with your medicine. What should I watch for while using this medicine? You may need blood work done while you are taking this medicine. If you are going to need a MRI, CT scan, or other procedure, tell your doctor that you are using this medicine (On-Body Injector only). What side effects may I notice from receiving this medicine? Side effects that you should report to your doctor or health care professional as soon as possible: -allergic reactions like skin rash, itching or hives, swelling of the face, lips, or tongue -dizziness -fever -pain, redness, or irritation at site   where injected -pinpoint red spots on the skin -red or dark-brown urine -shortness of breath or breathing problems -stomach or side pain, or pain at the shoulder -swelling -tiredness -trouble passing urine or change in the amount of urine Side  effects that usually do not require medical attention (report to your doctor or health care professional if they continue or are bothersome): -bone pain -muscle pain This list may not describe all possible side effects. Call your doctor for medical advice about side effects. You may report side effects to FDA at 1-800-FDA-1088. Where should I keep my medicine? Keep out of the reach of children. Store pre-filled syringes in a refrigerator between 2 and 8 degrees C (36 and 46 degrees F). Do not freeze. Keep in carton to protect from light. Throw away this medicine if it is left out of the refrigerator for more than 48 hours. Throw away any unused medicine after the expiration date. NOTE: This sheet is a summary. It may not cover all possible information. If you have questions about this medicine, talk to your doctor, pharmacist, or health care provider.  2018 [employer] Standard (2016-02-08 12:58:03)  

## 2017-04-10 NOTE — Patient Instructions (Signed)

## 2017-04-11 ENCOUNTER — Telehealth: Payer: Self-pay | Admitting: Emergency Medicine

## 2017-04-11 ENCOUNTER — Ambulatory Visit: Payer: Medicare Other

## 2017-04-11 LAB — BPAM RBC
Blood Product Expiration Date: 201903142359
Blood Product Expiration Date: 201903192359
ISSUE DATE / TIME: 201902131542
ISSUE DATE / TIME: 201902141543
UNIT TYPE AND RH: 5100
Unit Type and Rh: 5100

## 2017-04-11 LAB — TYPE AND SCREEN
ABO/RH(D): O POS
ANTIBODY SCREEN: NEGATIVE
UNIT DIVISION: 0
Unit division: 0

## 2017-04-11 NOTE — Telephone Encounter (Addendum)
Per Dr.Mohamed patient does not have to come in today for granix. Pt verbalized understanding of this.   ----- Message from Dawn Baumgartner, RN sent at 04/10/2017  5:38 PM EST ----- Regarding: Dawn Mercer,   Wanted to check with you regarding the granix injections for Dawn Mercer.  According to your note she was to get granix yesterday and today, she is also scheduled for injection on Friday 2/15.  Does she need to come for this injection?  Please call her and let her know.  She needs to be called by 10 am since she lives far away.    Thanks,  [employer]

## 2017-04-15 ENCOUNTER — Ambulatory Visit: Payer: Medicare Other | Admitting: Oncology

## 2017-04-15 ENCOUNTER — Other Ambulatory Visit: Payer: Medicare Other

## 2017-04-16 ENCOUNTER — Other Ambulatory Visit: Payer: Medicare Other

## 2017-04-16 ENCOUNTER — Ambulatory Visit: Payer: Medicare Other | Admitting: Oncology

## 2017-04-17 ENCOUNTER — Inpatient Hospital Stay (HOSPITAL_BASED_OUTPATIENT_CLINIC_OR_DEPARTMENT_OTHER): Payer: Medicare Other | Admitting: Oncology

## 2017-04-17 ENCOUNTER — Encounter: Payer: Self-pay | Admitting: Oncology

## 2017-04-17 ENCOUNTER — Inpatient Hospital Stay: Payer: Medicare Other

## 2017-04-17 VITALS — BP 113/68 | HR 104 | Temp 98.2°F | Resp 20 | Ht 65.0 in | Wt 177.2 lb

## 2017-04-17 DIAGNOSIS — C3410 Malignant neoplasm of upper lobe, unspecified bronchus or lung: Secondary | ICD-10-CM

## 2017-04-17 DIAGNOSIS — D61818 Other pancytopenia: Secondary | ICD-10-CM

## 2017-04-17 DIAGNOSIS — Z79899 Other long term (current) drug therapy: Secondary | ICD-10-CM | POA: Diagnosis not present

## 2017-04-17 DIAGNOSIS — D708 Other neutropenia: Secondary | ICD-10-CM

## 2017-04-17 DIAGNOSIS — Z923 Personal history of irradiation: Secondary | ICD-10-CM | POA: Diagnosis not present

## 2017-04-17 DIAGNOSIS — Z9221 Personal history of antineoplastic chemotherapy: Secondary | ICD-10-CM

## 2017-04-17 DIAGNOSIS — C3401 Malignant neoplasm of right main bronchus: Secondary | ICD-10-CM

## 2017-04-17 DIAGNOSIS — C3411 Malignant neoplasm of upper lobe, right bronchus or lung: Secondary | ICD-10-CM

## 2017-04-17 LAB — SAMPLE TO BLOOD BANK

## 2017-04-17 LAB — CBC WITH DIFFERENTIAL (CANCER CENTER ONLY)
BASOS PCT: 0 %
Basophils Absolute: 0 10*3/uL (ref 0.0–0.1)
EOS ABS: 0.1 10*3/uL (ref 0.0–0.5)
Eosinophils Relative: 4 %
HCT: 33 % — ABNORMAL LOW (ref 34.8–46.6)
Hemoglobin: 11 g/dL — ABNORMAL LOW (ref 11.6–15.9)
Lymphocytes Relative: 57 %
Lymphs Abs: 0.7 10*3/uL — ABNORMAL LOW (ref 0.9–3.3)
MCH: 30.1 pg (ref 25.1–34.0)
MCHC: 33.3 g/dL (ref 31.5–36.0)
MCV: 90.4 fL (ref 79.5–101.0)
MONO ABS: 0.1 10*3/uL (ref 0.1–0.9)
MONOS PCT: 4 %
Neutro Abs: 0.4 10*3/uL — CL (ref 1.5–6.5)
Neutrophils Relative %: 35 %
PLATELETS: 54 10*3/uL — AB (ref 145–400)
RBC: 3.65 MIL/uL — ABNORMAL LOW (ref 3.70–5.45)
RDW: 19.6 % — ABNORMAL HIGH (ref 11.2–14.5)
WBC Count: 1.2 10*3/uL — ABNORMAL LOW (ref 3.9–10.3)
nRBC: 0 /100 WBC

## 2017-04-17 LAB — CMP (CANCER CENTER ONLY)
ALK PHOS: 84 U/L (ref 40–150)
ALT: 21 U/L (ref 0–55)
AST: 21 U/L (ref 5–34)
Albumin: 3.1 g/dL — ABNORMAL LOW (ref 3.5–5.0)
Anion gap: 11 (ref 3–11)
BUN: 15 mg/dL (ref 7–26)
CALCIUM: 9.2 mg/dL (ref 8.4–10.4)
CO2: 28 mmol/L (ref 22–29)
Chloride: 100 mmol/L (ref 98–109)
Creatinine: 0.82 mg/dL (ref 0.60–1.10)
GFR, Estimated: >60 mL/min (ref >60)
GLUCOSE: 80 mg/dL (ref 70–140)
Potassium: 4.1 mmol/L (ref 3.5–5.1)
SODIUM: 139 mmol/L (ref 136–145)
Total Bilirubin: 1.7 mg/dL — ABNORMAL HIGH (ref 0.2–1.2)
Total Protein: 6.9 g/dL (ref 6.4–8.3)

## 2017-04-17 NOTE — Progress Notes (Signed)
[facility] OFFICE PROGRESS NOTE  Lawerance Cruel, MD [place] [address]  DIAGNOSIS: Recurrent non-small cell lung cancer initially diagnosed as stage IIIA (T1b., N2, M0) non-small cell lung cancer, squamous cell carcinoma presented with right upper lobe lung nodule in addition to mediastinal lymphadenopathy diagnosed in November of 2014.  PRIOR THERAPY: 1) Concurrent chemoradiation with weekly carboplatin for AUC of 2 and paclitaxel 45 mg/M2, status post 8 cycles, last dose was given 03/15/2013 with partial response. 2) Consolidation chemotherapy with carboplatin for AUC of 5 and paclitaxel 175 mg/M2 every 3 weeks with Neulasta support, status post 3 cycles. First dose 05/03/2013. Last dose was given 06/14/2013.  CURRENT THERAPY: Systemic chemotherapy with carboplatin for AUC of 5, paclitaxel 175 mg/M2 and Keytruda 200 mg IV every 3 weeks. She has not yet started chemotherapy due to pancytopenia.  INTERVAL HISTORY: Dawn Mercer 72 y.o. female returns for routine follow-up visit accompanied by her husband.  The patient is feeling better this week than she is last week.  Patient was noted to have pancytopenia at her visit last week.  She was given Granix 480 mcg subcutaneously times 2 days and was given 2 units of packed red blood cells for her anemia.  Patient has ongoing dyspnea on exertion.  She remains on home oxygen.  Patient denies fevers and chills.  Denies chest pain, cough, hemoptysis.  Denies nausea, vomiting, constipation, diarrhea.  She denies bleeding.  She bruises easily.  The patient is here for evaluation and repeat lab work.  MEDICAL HISTORY: Past Medical History:  Diagnosis Date  . Anxiety   . Bronchitis    Hx: of  . 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 Chest congestion 04/11/2016  . Claustrophobia   . COPD (chronic obstructive pulmonary disease) ([facility])   . GERD (gastroesophageal reflux disease)    . H/O hiatal hernia   . Hay fever    Hx: of  . History of radiation therapy 01/28/13-03/23/13   63 gray to right upper lobe  . Hypothyroidism   . IBS (irritable bowel syndrome)    Hx: of  . Lung cancer, upper lobe ([facility]) dx'd 12/2012   chemo and XRt complete  . 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 Pneumonia    years ago  . Seizures ([facility])    Hx: of over 40 years ago  . Shortness of breath    Hx: of with exertion  . Status post radiation therapy 10/1999  . 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  MEDICATIONS:  Current Outpatient Medications  Medication Sig Dispense Refill  . albuterol (PROVENTIL HFA;VENTOLIN HFA) 108 (90 Base) MCG/ACT inhaler Inhale 2 puffs into the lungs every 6 (six) hours as needed for wheezing or shortness of breath.    Marland Kitchen albuterol (PROVENTIL) (2.5 MG/3ML) 0.083% nebulizer solution Take 3 mLs (2.5 mg total) by nebulization every 6 (six) hours as needed for wheezing or shortness of breath. 300 mL 11  . ALPRAZolam (XANAX) 0.5 MG tablet Take 0.5 tablets (0.25 mg total) by mouth 2 (two) times daily as needed for anxiety. 30 tablet 0  . aspirin EC 81 MG tablet Take 81 mg by mouth daily.    . Cholecalciferol (VITAMIN D3) 1000 units CAPS Take 1,000 Units by mouth daily.    . cyanocobalamin (,VITAMIN B-12,) 1000 MCG/ML injection Inject 1,000 mcg into  the muscle every 30 (thirty) days.    . furosemide (LASIX) 40 MG tablet Take 1 tablet (40 mg total) by mouth daily. 30 tablet 0  . Glycopyrrolate-Formoterol (BEVESPI AEROSPHERE) 9-4.8 MCG/ACT AERO Inhale 2 Inhalers into the lungs 2 (two) times daily. Inhale 2 puffs daily, rinse mouth after use. 1 Inhaler 5  . guaiFENesin (MUCINEX) 600 MG 12 hr tablet Take 1 tablet (600 mg total) by mouth 2 (two) times daily as needed for to loosen phlegm. 30 tablet 0  . ipratropium-albuterol (DUONEB) 0.5-2.5 (3) MG/3ML SOLN Take 3 mLs by nebulization every 4 (four) hours  as needed. 360 mL 11  . iron polysaccharides (NIFEREX) 150 MG capsule Take 150 mg daily by mouth.    . levothyroxine (SYNTHROID, LEVOTHROID) 125 MCG tablet Take 62.5-125 mcg by mouth See admin instructions. Take 125 mcg daily except Sundays take 62.5 mcg    . OXYGEN 2lpm with rest and 3lpm with exertion    . RABEprazole (ACIPHEX) 20 MG tablet Take 30-60 min before first meal of the day (Patient taking differently: Take 30-60 mg by mouth daily. Before first meal of the day) 30 tablet 2  . aspirin EC 325 MG tablet Take 325 mg by mouth daily as needed for mild pain or moderate pain.     No current facility-administered medications for this visit.     SURGICAL HISTORY:  Past Surgical History:  Procedure Laterality Date  . CHOLECYSTECTOMY N/A 08/03/2013   Procedure: LAPAROSCOPIC CHOLECYSTECTOMY;  Surgeon: Harl Bowie, MD;  Location: [facility];  Service: General;  Laterality: N/A;  . COLONOSCOPY W/ BIOPSIES AND POLYPECTOMY     Hx: of  . IR GENERIC HISTORICAL  04/11/2016   IR REMOVAL TUN ACCESS W/ PORT W/O FL MOD SED 04/11/2016 Arne Cleveland, MD [facility]  . KYPHOPLASTY N/A 12/06/2015   Procedure: THORACIC 5 KYPHOPLASTY;  Surgeon: Melina Schools, MD;  Location: [facility];  Service: Orthopedics;  Laterality: N/A;  . LASER ABLATION  04/2004   for VIN III  . PORTACATH PLACEMENT    . TUBAL LIGATION    . VIDEO BRONCHOSCOPY Bilateral 02/14/2017   Procedure: VIDEO BRONCHOSCOPY WITH FLUORO;  Surgeon: Juanito Doom, MD;  Location: [facility];  Service: Cardiopulmonary;  Laterality: Bilateral;  . VIDEO BRONCHOSCOPY WITH ENDOBRONCHIAL NAVIGATION N/A 12/31/2012   Procedure: VIDEO BRONCHOSCOPY WITH ENDOBRONCHIAL NAVIGATION;  Surgeon: Melrose Nakayama, MD;  Location: [facility];  Service: Thoracic;  Laterality: N/A;  . VIDEO BRONCHOSCOPY WITH ENDOBRONCHIAL ULTRASOUND N/A 12/31/2012   Procedure: VIDEO BRONCHOSCOPY WITH ENDOBRONCHIAL ULTRASOUND;  Surgeon: Melrose Nakayama, MD;  Location: [facility];   Service: Thoracic;  Laterality: N/A;    REVIEW OF SYSTEMS:   Review of Systems  Constitutional: Negative for appetite change, chills, fever and unexpected weight change. Positive for fatigue. HENT:   Negative for mouth sores, nosebleeds, sore throat and trouble swallowing.   Eyes: Negative for eye problems and icterus.  Respiratory: Negative for cough, hemoptysis, shortness of breath at rest and wheezing.  Positive for shortness of breath with exertion.  She wears home oxygen.  Cardiovascular: Negative for chest pain and leg swelling.  Gastrointestinal: Negative for abdominal pain, constipation, diarrhea, nausea and vomiting.  Genitourinary: Negative for bladder incontinence, difficulty urinating, dysuria, frequency and hematuria.   Musculoskeletal: Negative for back pain, neck pain and neck stiffness. Positive for generalized weakness. Skin: Negative for itching and rash.  Neurological: Negative for dizziness, extremity weakness, gait problem, headaches, light-headedness and seizures.  Hematological: Negative for adenopathy.  Positive for easy bruisability.  Psychiatric/Behavioral: Negative for confusion, depression and sleep disturbance. The patient is not nervous/anxious.     PHYSICAL EXAMINATION:  Blood pressure 113/68, pulse (!) 104, temperature 98.2 F (36.8 C), temperature source Oral, resp. rate 20, height 5' 5" (1.651 m), weight 177 lb 3.2 oz (80.4 kg), SpO2 90 %.  ECOG PERFORMANCE STATUS: 1 - Symptomatic but completely ambulatory  Physical Exam  Constitutional: Oriented to person, place, and time and well-developed, well-nourished, and in no distress. No distress.  HENT:  Head: Normocephalic and atraumatic.  Mouth/Throat: Oropharynx is clear and moist. No oropharyngeal exudate.  Eyes: Conjunctivae are normal. Right eye exhibits no discharge. Left eye exhibits no discharge. No scleral icterus.  Neck: Normal range of motion. Neck supple.  Cardiovascular: Normal rate, regular  rhythm, normal heart sounds and intact distal pulses.   Pulmonary/Chest: Effort normal.  Diminished breath sounds bilaterally. No respiratory distress. No wheezes. No rales.  Abdominal: Soft. Bowel sounds are normal. Exhibits no distension and no mass. There is no tenderness.  Musculoskeletal: Normal range of motion. Exhibits no edema.  Lymphadenopathy:    No cervical adenopathy.  Neurological: Alert and oriented to person, place, and time. Exhibits normal muscle tone. Coordination normal.  Skin: Skin is warm and dry. No rash noted. Not diaphoretic. No erythema. No pallor.  Psychiatric: Mood, memory and judgment normal.  Vitals reviewed.  LABORATORY DATA: Lab Results  Component Value Date   WBC 1.2 (L) 04/17/2017   HGB 8.8 (L) 03/24/2017   HCT 33.0 (L) 04/17/2017   MCV 90.4 04/17/2017   PLT 54 (L) 04/17/2017      Chemistry      Component Value Date/Time   NA 139 04/17/2017 1310   NA 141 01/29/2017 0944   K 4.1 04/17/2017 1310   K 4.5 01/29/2017 0944   CL 100 04/17/2017 1310   CO2 28 04/17/2017 1310   CO2 27 01/29/2017 0944   BUN 15 04/17/2017 1310   BUN 8.1 01/29/2017 0944   CREATININE 0.82 04/17/2017 1310   CREATININE 0.8 01/29/2017 0944      Component Value Date/Time   CALCIUM 9.2 04/17/2017 1310   CALCIUM 9.1 01/29/2017 0944   ALKPHOS 84 04/17/2017 1310   ALKPHOS 77 01/29/2017 0944   AST 21 04/17/2017 1310   AST 18 01/29/2017 0944   ALT 21 04/17/2017 1310   ALT 14 01/29/2017 0944   BILITOT 1.7 (H) 04/17/2017 1310   BILITOT 1.06 01/29/2017 0944       RADIOGRAPHIC STUDIES:  Ct Angio Chest Pe W And/or Wo Contrast  Result Date: 03/19/2017 CLINICAL DATA:  Dyspnea worsening since 10:15 a.m. patient has a known lung mass in the right upper lobe and was supposed to start chemotherapy today. EXAM: CT ANGIOGRAPHY CHEST WITH CONTRAST TECHNIQUE: Multidetector CT imaging of the chest was performed using the standard protocol during bolus administration of intravenous  contrast. Multiplanar CT image reconstructions and MIPs were obtained to evaluate the vascular anatomy. CONTRAST:  164m ISOVUE-370 IOPAMIDOL (ISOVUE-370) INJECTION 76% COMPARISON:  01/12/2017 FINDINGS: Cardiovascular: Normal 3 vessel branch pattern of the great vessels with atherosclerosis noted proximally. Aortic atherosclerosis is also noted without aneurysm or dissection. Good opacification of the pulmonary arteries. Mild dilatation of the main pulmonary artery to 3.2 cm compatible with changes of chronic pulmonary hypertension. No evidence of acute pulmonary embolus. Mild cardiomegaly without pericardial effusion. Coronary arteriosclerosis. Slight dextroversion of the heart due to volume loss on the right. There remains slight compression of the main  right pulmonary artery secondary to post radiative change as well as obliteration of the right upper lobe pulmonary artery. Mediastinum/Nodes: No adenopathy at the base of the neck. No supraclavicular nor axillary lymphadenopathy. No thyromegaly or mass. There remains soft tissue density emanating from the right hilum superiorly impressing upon the adjacent right pulmonary artery and tracheobronchial tree. This measures approximately 3.2 x 2.4 cm versus 3 x 2.3 cm previously and is without significant change. Stable right infrahilar and small subcarinal lymph nodes as before. The largest is subcarinal approximately 1.2 cm short axis. Lungs/Pleura: Stable centrilobular and paraseptal emphysematous change with volume loss on the right consistent with post treatment change and radiation fibrosis. Superimposed on emphysema are faint diffuse ground-glass opacities within both lungs that may represent a component of alveolitis or pneumonitis versus stigmata of mild pulmonary edema. No new focal infiltrate nor pulmonary nodules. Upper Abdomen: The included upper abdomen is nonacute. Musculoskeletal: Degenerative changes of the thoracic spine without acute compression  deformity. Stable kyphoplasty at T5 with chronic stable superior endplate compression of T6. No aggressive osteolytic or blastic lesions. Review of the MIP images confirms the above findings. IMPRESSION: 1. No acute pulmonary embolus. Redemonstration of occluded right upper lobe pulmonary arterial branch likely secondary to post treatment change and fibrosis. 2. Stable masslike opacity about the right hilum measuring approximately 3.2 x 2.4 cm versus 3 x 2.3 cm previously, slightly impressing upon the adjacent right pulmonary artery and tracheobronchial tree. 3. Stable small right infrahilar and subcarinal lymph nodes. 4. Faint ground-glass opacities of the lungs superimposed on emphysema, nonspecific but may represent stigmata of mild pulmonary edema, alveolitis or pneumonitis, or an atypical infection among some possibilities. 5. Coronary arteriosclerosis. 6. T5 kyphoplasty with chronic superior endplate compression of T6. No suspicious osseous lesions. Aortic Atherosclerosis (ICD10-I70.0) and Emphysema (ICD10-J43.9). Electronically Signed   By: Ashley Royalty M.D.   On: 03/19/2017 15:04   Dg Chest Port 1 View  Result Date: 03/19/2017 CLINICAL DATA:  History lung cancer.  Decreased oxygen saturation EXAM: PORTABLE CHEST 1 VIEW COMPARISON:  CT chest 01/12/2017 FINDINGS: Stable right perihilar and upper lobe posttreatment changes. Right lung volume loss. Underlying COPD. No pleural effusion or pneumothorax. No focal consolidation. Stable cardiomediastinal silhouette. No acute osseous abnormality. Prior upper-mid thoracic vertebral body augmentation. IMPRESSION: No acute cardiopulmonary disease. Stable right perihilar and upper lobe posttreatment changes. Electronically Signed   By: Kathreen Devoid   On: 03/19/2017 12:42     ASSESSMENT/PLAN:   This is a very pleasant 72 year old white female with recurrent non-small cell lung cancer initially diagnosed as stage IIIA non-small cell lung cancer status post a course  of concurrent chemoradiation followed by consolidation chemotherapy with carboplatin and paclitaxel and she has been on observation for close to 4 years. Her recent CT scan of the chest showed increasing soft tissue in the right hilum suspicious for disease recurrence.  The patient underwent repeat bronchoscopy under the care of Dr. Lake Bells and the final pathology was consistent with recurrent non-small cell lung cancer, squamous cell carcinoma. The patient was scheduled to begin palliative systemic chemotherapy with carboplatin for AUC of 5, paclitaxel 175 mg/M2 and Keytruda 200 mg IV every 3 weeks with Neulasta support.  However, this has been delayed due to hospital admission and persistent pancytopenia. The patient received 2 units of packed red blood cells last week along with Granix 480 mcg subcutaneously times 2 days. The patient returns this week for evaluation and repeat lab work.  The patient  was seen with Dr. Julien Nordmann.  CBC from today was reviewed with the patient and her husband.  Her hemoglobin has improved to 11.0 following the blood transfusion.  However, her total white blood cell count remains low at 1.2 and her ANC remains low at 0.4.  Her platelets have dropped further down to 54,000.  Etiology of her pancytopenia is not clear.  The patient could have bone marrow involvement with her cancer versus a new hematological issue going on.  Recommend proceeding with a bone marrow biopsy within the next 2-3 days.  We will hold off on given Granix due to the pending bone marrow biopsy.  The patient was cautioned about neutropenic and bleeding precautions.  We will plan to bring the patient back in approximately 1 week for repeat lab work and to review her preliminary bone marrow biopsy results.  The patient was advised to call immediately if she has any concerning symptoms in the interval. The patient voices understanding of current disease status and treatment options and is in agreement with the  current care plan. All questions were answered. The patient knows to call the clinic with any problems, questions or concerns. We can certainly see the patient much sooner if necessary.  Orders Placed This Encounter  Procedures  . CT BIOPSY    Standing Status:   Future    Standing Expiration Date:   07/18/2018    Order Specific Question:   Reason for exam:    Answer:   Pancytopenia. Hx of lung cancer. No recent chemo.    Order Specific Question:   Preferred imaging location?    Answer:   [facility]  . CT BONE MARROW BIOPSY & ASPIRATION    Standing Status:   Future    Standing Expiration Date:   07/16/2018    Order Specific Question:   Reason for Exam (SYMPTOM  OR DIAGNOSIS REQUIRED)    Answer:   Pancytopenia. Hx of lung cancer. No recent chemo.    Order Specific Question:   Preferred imaging location?    Answer:   [facility]    Order Specific Question:   Radiology Contrast Protocol - do NOT remove file path    Answer:   _0 [email or web address]  . CT BIOPSY    Standing Status:   Future    Standing Expiration Date:   07/18/2018    Order Specific Question:   Reason for exam:    Answer:   Pancytopenia. Hx of lung cancer. No chemo since 2015.    Order Specific Question:   Preferred imaging location?    Answer:   [facility]  . CT BONE MARROW BIOPSY & ASPIRATION    Standing Status:   Future    Standing Expiration Date:   07/16/2018    Order Specific Question:   Reason for Exam (SYMPTOM  OR DIAGNOSIS REQUIRED)    Answer:   Pancytopenia. Hx of lung cancer. No chemo since 2015.    Order Specific Question:   Preferred imaging location?    Answer:   [facility]    Order Specific Question:   Radiology Contrast Protocol - do NOT remove file path    Answer:   _1 [email or web address].pdf  . CBC with Differential ([place] Only)    Standing Status:   Future    Standing Expiration Date:   04/17/2018  . CMP ([place] only)    Standing Status:   Future    Standing Expiration Date:   04/17/2018  .  Sample to Blood Bank    Standing Status:   Future    Standing Expiration Date:   04/17/2018     Mikey Bussing, DNP, AGPCNP-BC, AOCNP 04/17/17   ADDENDUM: Hematology/Oncology Attending: I had a face-to-face encounter with the patient.  I recommended her care plan.  This is a very pleasant 72 years old white female with metastatic non-small cell lung cancer, squamous cell carcinoma that was initially diagnosed as a stage IIIa status post a course of concurrent chemoradiation followed by consolidation chemotherapy.  She has evidence for disease recurrence recently.  The patient was considered for treatment with systemic chemotherapy with carboplatin, paclitaxel and Keytruda but unfortunately over the last few weeks she had significant pancytopenia. She was recently admitted to [facility] with congestive heart failure and COPD.  Her platelet count as well as hemoglobin hematocrit and absolute neutrophil count has been low over the last 2 weeks. I had a lengthy discussion with the patient and her husband today about her condition.  I recommended for her to delay the start of her systemic chemotherapy until improvement of her blood count.  We will arrange for the patient to have bone marrow biopsy and aspirate for evaluation of her pancytopenia.  I will hold on giving her any growth factor for now until the bone marrow biopsy and aspirate is performed to avoid any misinterpretation of her results. We will arrange for the patient to come back for follow-up visit in 1 week for reevaluation and repeat blood work. The patient and her husband agreed to the current plan. She was advised to call immediately if she has any concerning symptoms in the interval.  Disclaimer: This note was dictated with voice recognition software. Similar sounding words can inadvertently be transcribed and may be missed upon  review. Eilleen Kempf, MD 04/18/17

## 2017-04-18 ENCOUNTER — Telehealth: Payer: Self-pay | Admitting: *Deleted

## 2017-04-18 ENCOUNTER — Other Ambulatory Visit: Payer: Medicare Other

## 2017-04-18 ENCOUNTER — Ambulatory Visit: Payer: Medicare Other | Admitting: Oncology

## 2017-04-18 NOTE — Telephone Encounter (Signed)
Oncology Nurse Navigator Documentation  Oncology Nurse Navigator Flowsheets 04/18/2017  Navigator Location [facility]-  Navigator Encounter Type Telephone/I received emails from cancer center team to get Ms. Ladley scheduled for her biopsy.  I called patient and updated her on appt.  She has questions about procedure.  I clarified.  She was thankful for the call and will be here at 8 on 04/21/17.  I relayed that information to the caner center team.   Telephone Outgoing Call  Patient Visit Type Follow-up  Treatment Phase Pre-Tx/Tx Discussion  Barriers/Navigation Needs Education;Coordination of Care  Education Other  Interventions Coordination of Care;Education  Coordination of Care Other  Education Method Verbal  Acuity Level 2  Time Spent with Patient 30

## 2017-04-21 ENCOUNTER — Inpatient Hospital Stay: Payer: Medicare Other

## 2017-04-21 ENCOUNTER — Inpatient Hospital Stay (HOSPITAL_BASED_OUTPATIENT_CLINIC_OR_DEPARTMENT_OTHER): Payer: Medicare Other | Admitting: Adult Health

## 2017-04-21 ENCOUNTER — Encounter: Payer: Self-pay | Admitting: *Deleted

## 2017-04-21 ENCOUNTER — Other Ambulatory Visit: Payer: Medicare Other

## 2017-04-21 VITALS — BP 111/53 | HR 80 | Temp 97.6°F | Resp 19

## 2017-04-21 DIAGNOSIS — D61818 Other pancytopenia: Secondary | ICD-10-CM

## 2017-04-21 DIAGNOSIS — C3411 Malignant neoplasm of upper lobe, right bronchus or lung: Secondary | ICD-10-CM | POA: Diagnosis not present

## 2017-04-21 DIAGNOSIS — R110 Nausea: Secondary | ICD-10-CM | POA: Diagnosis not present

## 2017-04-21 LAB — CBC WITH DIFFERENTIAL (CANCER CENTER ONLY)
Basophils Absolute: 0 10*3/uL (ref 0.0–0.1)
Basophils Relative: 0 %
EOS ABS: 0 10*3/uL (ref 0.0–0.5)
EOS PCT: 2 %
HCT: 30.8 % — ABNORMAL LOW (ref 34.8–46.6)
HEMOGLOBIN: 10.1 g/dL — AB (ref 11.6–15.9)
LYMPHS ABS: 0.6 10*3/uL — AB (ref 0.9–3.3)
Lymphocytes Relative: 60 %
MCH: 30.3 pg (ref 25.1–34.0)
MCHC: 32.8 g/dL (ref 31.5–36.0)
MCV: 92.5 fL (ref 79.5–101.0)
MONO ABS: 0 10*3/uL — AB (ref 0.1–0.9)
MONOS PCT: 4 %
NEUTROS PCT: 34 %
Neutro Abs: 0.4 10*3/uL — CL (ref 1.5–6.5)
Platelet Count: 61 10*3/uL — ABNORMAL LOW (ref 145–400)
RBC: 3.33 MIL/uL — ABNORMAL LOW (ref 3.70–5.45)
RDW: 20.6 % — ABNORMAL HIGH (ref 11.2–14.5)
WBC Count: 1.1 10*3/uL — ABNORMAL LOW (ref 3.9–10.3)

## 2017-04-21 MED ORDER — LIDOCAINE HCL 2 % IJ SOLN
INTRAMUSCULAR | Status: AC
  Filled 2017-04-21: qty 20

## 2017-04-21 MED ORDER — LORAZEPAM 1 MG PO TABS
ORAL_TABLET | ORAL | Status: AC
  Filled 2017-04-21: qty 1

## 2017-04-21 MED ORDER — LORAZEPAM 1 MG PO TABS
1.0000 mg | ORAL_TABLET | Freq: Once | ORAL | Status: AC
  Administered 2017-04-21: 1 mg via ORAL

## 2017-04-21 NOTE — Progress Notes (Signed)
Arrived to lab after laying on back for 30 minutes. Vital signs obtained. Small area of blood noted on dressing to back.

## 2017-04-21 NOTE — Progress Notes (Signed)
INDICATION: Pancytopenia  Bone Marrow Biopsy and Aspiration Procedure Note   Informed consent was obtained and potential risks including bleeding, infection and pain were reviewed with the patient.  The patient's name, date of birth, identification, consent and allergies were verified prior to the start of procedure and time out was performed.  The right posterior iliac crest was chosen as the site of biopsy.  The skin was prepped with ChloraPrep.   10 cc of 2% lidocaine was used to provide local anaesthesia.   8 cc of bone marrow aspirate was obtained followed by 1cm biopsy.  Pressure was applied to the biopsy site and bandage was placed over the biopsy site. Patient was made to lie on the back for 15 mins prior to discharge.  The procedure was tolerated well. COMPLICATIONS: None BLOOD LOSS: none The patient was discharged home in stable condition with a 1 week follow up to review results.  Patient was provided with post bone marrow biopsy instructions and instructed to call if there was any bleeding or worsening pain.  Specimens sent for flow cytometry, cytogenetics and additional studies.  Signed Scot Dock, NP

## 2017-04-21 NOTE — Progress Notes (Signed)
Oncology Nurse Navigator Documentation  Oncology Nurse Navigator Flowsheets 04/21/2017  Navigator Location [facility]  Navigator Encounter Type Clinic/MDC/I spoke with patient and husband today.  Patient is having bone marrow biopsy today and is nervous. I sat with her before her procedure and spoke to her about how she was doing.  I offered support.  I also spoke with her husband and right before her procedure he states he was not feeling well.  I took him out of the procedure room and sat with him near the lobby.  We talked about his feelings regarding his wife. He is very concerned about her prognosis and is sad.  I listened as he explained. I offered support and guidance.  I will also update CSW on this case.  They may be able to offer more counseling.   Patient Visit Type Follow-up  Treatment Phase Pre-Tx/Tx Discussion  Barriers/Navigation Needs Education;Family concerns  Education Other  Interventions Education  Education Method Verbal  Acuity Level 2  Time Spent with Patient 68

## 2017-04-21 NOTE — Patient Instructions (Signed)
Bone Marrow Aspiration and Bone Marrow Biopsy, Adult, Care After This sheet gives you information about how to care for yourself after your procedure. Your health care provider may also give you more specific instructions. If you have problems or questions, contact your health care provider. What can I expect after the procedure? After the procedure, it is common to have:  Mild pain and tenderness.  Swelling.  Bruising.  Follow these instructions at home:  Take over-the-counter or prescription medicines only as told by your health care provider.  Do not take baths, swim, or use a hot tub until your health care provider approves. Ask if you can take a shower or have a sponge bath.  Follow instructions from your health care provider about how to take care of the puncture site. Make sure you: ? Wash your hands with soap and water before you change your bandage (dressing). If soap and water are not available, use hand sanitizer. ? Change your dressing as told by your health care provider.  Check your puncture siteevery day for signs of infection. Check for: ? More redness, swelling, or pain. ? More fluid or blood. ? Warmth. ? Pus or a bad smell.  Return to your normal activities as told by your health care provider. Ask your health care provider what activities are safe for you.  Do not drive for 24 hours if you were given a medicine to help you relax (sedative).  Keep all follow-up visits as told by your health care provider. This is important. Contact a health care provider if:  You have more redness, swelling, or pain around the puncture site.  You have more fluid or blood coming from the puncture site.  Your puncture site feels warm to the touch.  You have pus or a bad smell coming from the puncture site.  You have a fever.  Your pain is not controlled with medicine. This information is not intended to replace advice given to you by your health care provider. Make sure  you discuss any questions you have with your health care provider. Document Released: 08/31/2004 Document Revised: 09/01/2015 Document Reviewed: 07/26/2015 Elsevier Interactive Patient Education  2018 [employer].

## 2017-04-22 ENCOUNTER — Other Ambulatory Visit: Payer: Self-pay | Admitting: Internal Medicine

## 2017-04-22 ENCOUNTER — Telehealth: Payer: Self-pay | Admitting: Adult Health

## 2017-04-22 NOTE — Telephone Encounter (Signed)
Per 2/25 no los

## 2017-04-23 ENCOUNTER — Other Ambulatory Visit: Payer: Medicare Other

## 2017-04-23 ENCOUNTER — Ambulatory Visit: Payer: Medicare Other | Admitting: Pulmonary Disease

## 2017-04-23 NOTE — Progress Notes (Deleted)
Subjective:   PATIENT ID: Dawn Mercer GENDER: female DOB: 02/17/1946, MRN: [id number]  Synopsis: Referred in Dec 2018 for COPD, history of lung cancer treated with chemotherapy and radiation in 2014.  Bronchoscopy performed in December 2018 showed evidence of recurrent non-small cell lung cancer on a BAL from the right upper lobe.  HPI  No chief complaint on file.  ***   Past Medical History:  Diagnosis Date  . Anxiety   . Bronchitis    Hx: of  . 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 Chest congestion 04/11/2016  . Claustrophobia   . COPD (chronic obstructive pulmonary disease) ([place])   . GERD (gastroesophageal reflux disease)   . H/O hiatal hernia   . Hay fever    Hx: of  . History of radiation therapy 01/28/13-03/23/13   63 gray to right upper lobe  . Hypothyroidism   . IBS (irritable bowel syndrome)    Hx: of  . Lung cancer, upper lobe ([place]) dx'd 12/2012   chemo and XRt complete  . 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 Pneumonia    years ago  . Seizures ([place])    Hx: of over 40 years ago  . Shortness of breath    Hx: of with exertion  . Status post radiation therapy 10/1999  . 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 Negative for chills, fever and weight loss.  HENT: Negative for congestion, nosebleeds, sinus pain and sore throat.   Eyes: Negative for photophobia, pain and discharge.  Respiratory: Positive for cough, sputum production and shortness of breath. Negative for hemoptysis and wheezing.   Cardiovascular: Negative for chest pain, palpitations, orthopnea and leg swelling.  Gastrointestinal: Negative for abdominal pain, constipation, diarrhea, nausea and vomiting.  Genitourinary: Negative for dysuria, frequency, hematuria and urgency.  Musculoskeletal: Negative for back pain, joint pain, myalgias and neck pain.  Skin: Negative  for itching and rash.  Neurological: Negative for tingling, tremors, sensory change, speech change, focal weakness, seizures, weakness and headaches.  Psychiatric/Behavioral: Negative for memory loss, substance abuse and suicidal ideas. The patient is not nervous/anxious.       Objective:  Physical Exam   There were no vitals filed for this visit. 2L [place]  ***    CBC    Component Value Date/Time   WBC 1.1 (L) 04/21/2017 0919   WBC 1.7 (L) 03/24/2017 0453   RBC 3.33 (L) 04/21/2017 0919   HGB 8.8 (L) 03/24/2017 0453   HGB 11.9 01/29/2017 0944   HCT 30.8 (L) 04/21/2017 0919   HCT 36.2 01/29/2017 0944   PLT 61 (L) 04/21/2017 0919   PLT 113 (L) 01/29/2017 0944   MCV 92.5 04/21/2017 0919   MCV 94.3 01/29/2017 0944   MCH 30.3 04/21/2017 0919   MCHC 32.8 04/21/2017 0919   RDW 20.6 (H) 04/21/2017 0919   RDW 16.6 (H) 01/29/2017 0944   LYMPHSABS 0.6 (L) 04/21/2017 0919   LYMPHSABS 1.1 01/29/2017 0944   MONOABS 0.0 (L) 04/21/2017 0919   MONOABS 0.0 (L) 01/29/2017 0944   EOSABS 0.0 04/21/2017 0919   EOSABS 0.1 01/29/2017 0944   BASOSABS 0.0 04/21/2017 0919   BASOSABS 0.0 01/29/2017 0944     Chest imaging: 12/2016 CT angiogram> negative for PE, 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   Other imaging: May 2018 PET/CT showed increased uptake in the hilar mass which had increased in size somewhat with some pleural extension. 10/2016 Nuc stress test: There was no ST segment deviation noted during stress. The study is normal. No myocardial ischemia or scar. his is a low risk study. Nuclear stress EF: 56%.  PFT:  Labs:  Path: December 2018 bronchoscopy: Right upper lobe BAL cytology positive for non-small cell lung cancer, brushing negative.  Echo:  Heart  Catheterization:   Records from the November 2018 ER visit for  COPD exacerbation reviewed    Assessment & Plan:   No diagnosis found.  ***    Current Outpatient Medications:  .  albuterol (PROVENTIL HFA;VENTOLIN HFA) 108 (90 Base) MCG/ACT inhaler, Inhale 2 puffs into the lungs every 6 (six) hours as needed for wheezing or shortness of breath., Disp: , Rfl:  .  albuterol (PROVENTIL) (2.5 MG/3ML) 0.083% nebulizer solution, Take 3 mLs (2.5 mg total) by nebulization every 6 (six) hours as needed for wheezing or shortness of breath., Disp: 300 mL, Rfl: 11 .  ALPRAZolam (XANAX) 0.5 MG tablet, Take 0.5 tablets (0.25 mg total) by mouth 2 (two) times daily as needed for anxiety., Disp: 30 tablet, Rfl: 0 .  aspirin EC 325 MG tablet, Take 325 mg by mouth daily as needed for mild pain or moderate pain., Disp: , Rfl:  .  aspirin EC 81 MG tablet, Take 81 mg by mouth daily., Disp: , Rfl:  .  Cholecalciferol (VITAMIN D3) 1000 units CAPS, Take 1,000 Units by mouth daily., Disp: , Rfl:  .  cyanocobalamin (,VITAMIN B-12,) 1000 MCG/ML injection, Inject 1,000 mcg into the muscle every 30 (thirty) days., Disp: , Rfl:  .  furosemide (LASIX) 40 MG tablet, Take 1 tablet (40 mg total) by mouth daily., Disp: 30 tablet, Rfl: 0 .  Glycopyrrolate-Formoterol (BEVESPI AEROSPHERE) 9-4.8 MCG/ACT AERO, Inhale 2 Inhalers into the lungs 2 (two) times daily. Inhale 2 puffs daily, rinse mouth after use., Disp: 1 Inhaler, Rfl: 5 .  guaiFENesin (MUCINEX) 600 MG 12 hr tablet, Take 1 tablet (600 mg total) by mouth 2 (two) times daily as needed for to loosen phlegm., Disp: 30 tablet, Rfl: 0 .  ipratropium-albuterol (DUONEB) 0.5-2.5 (3) MG/3ML SOLN, Take 3 mLs by nebulization every 4 (four) hours as needed., Disp: 360 mL, Rfl: 11 .  iron polysaccharides (NIFEREX) 150 MG capsule, Take 150 mg daily by mouth., Disp: , Rfl:  .  levothyroxine (SYNTHROID, LEVOTHROID) 125 MCG tablet, Take 62.5-125 mcg by mouth See admin instructions. Take  125 mcg daily except Sundays take 62.5 mcg, Disp: , Rfl:  .  OXYGEN, 2lpm with rest and 3lpm with exertion, Disp: , Rfl:  .  RABEprazole (ACIPHEX) 20 MG tablet, Take 30-60 min before first meal of the day (Patient taking differently: Take 30-60 mg by mouth daily. Before first meal of the day), Disp: 30 tablet, Rfl: 2

## 2017-04-24 ENCOUNTER — Encounter (HOSPITAL_COMMUNITY): Payer: Self-pay

## 2017-04-24 ENCOUNTER — Inpatient Hospital Stay: Payer: Medicare Other

## 2017-04-24 ENCOUNTER — Inpatient Hospital Stay (HOSPITAL_BASED_OUTPATIENT_CLINIC_OR_DEPARTMENT_OTHER): Payer: Medicare Other | Admitting: Oncology

## 2017-04-24 ENCOUNTER — Encounter: Payer: Self-pay | Admitting: Oncology

## 2017-04-24 VITALS — BP 98/52 | HR 111 | Temp 98.0°F | Resp 18 | Ht 65.0 in

## 2017-04-24 DIAGNOSIS — Z9221 Personal history of antineoplastic chemotherapy: Secondary | ICD-10-CM

## 2017-04-24 DIAGNOSIS — D61818 Other pancytopenia: Secondary | ICD-10-CM | POA: Diagnosis not present

## 2017-04-24 DIAGNOSIS — C3411 Malignant neoplasm of upper lobe, right bronchus or lung: Secondary | ICD-10-CM

## 2017-04-24 DIAGNOSIS — Z79899 Other long term (current) drug therapy: Secondary | ICD-10-CM

## 2017-04-24 DIAGNOSIS — C3401 Malignant neoplasm of right main bronchus: Secondary | ICD-10-CM

## 2017-04-24 DIAGNOSIS — Z923 Personal history of irradiation: Secondary | ICD-10-CM

## 2017-04-24 DIAGNOSIS — D708 Other neutropenia: Secondary | ICD-10-CM

## 2017-04-24 DIAGNOSIS — R5382 Chronic fatigue, unspecified: Secondary | ICD-10-CM

## 2017-04-24 LAB — SAMPLE TO BLOOD BANK

## 2017-04-24 LAB — CBC WITH DIFFERENTIAL (CANCER CENTER ONLY)
BASOS ABS: 0 10*3/uL (ref 0.0–0.1)
Basophils Relative: 1 %
EOS PCT: 5 %
Eosinophils Absolute: 0 10*3/uL (ref 0.0–0.5)
HEMATOCRIT: 32.1 % — AB (ref 34.8–46.6)
Hemoglobin: 10.8 g/dL — ABNORMAL LOW (ref 11.6–15.9)
LYMPHS PCT: 64 %
Lymphs Abs: 0.5 10*3/uL — ABNORMAL LOW (ref 0.9–3.3)
MCH: 30.4 pg (ref 25.1–34.0)
MCHC: 33.5 g/dL (ref 31.5–36.0)
MCV: 90.8 fL (ref 79.5–101.0)
MONO ABS: 0 10*3/uL — AB (ref 0.1–0.9)
Monocytes Relative: 3 %
Neutro Abs: 0.2 10*3/uL — CL (ref 1.5–6.5)
Neutrophils Relative %: 27 %
PLATELETS: 97 10*3/uL — AB (ref 145–400)
RBC: 3.54 MIL/uL — ABNORMAL LOW (ref 3.70–5.45)
RDW: 24.3 % — ABNORMAL HIGH (ref 11.2–14.5)
WBC Count: 0.7 10*3/uL — CL (ref 3.9–10.3)

## 2017-04-24 LAB — CMP (CANCER CENTER ONLY)
ALT: 18 U/L (ref 0–55)
AST: 17 U/L (ref 5–34)
Albumin: 3.2 g/dL — ABNORMAL LOW (ref 3.5–5.0)
Alkaline Phosphatase: 91 U/L (ref 40–150)
Anion gap: 14 — ABNORMAL HIGH (ref 3–11)
BILIRUBIN TOTAL: 1.7 mg/dL — AB (ref 0.2–1.2)
BUN: 17 mg/dL (ref 7–26)
CHLORIDE: 98 mmol/L (ref 98–109)
CO2: 28 mmol/L (ref 22–29)
Calcium: 9.6 mg/dL (ref 8.4–10.4)
Creatinine: 0.94 mg/dL (ref 0.60–1.10)
GFR, Est AFR Am: >60 mL/min (ref >60)
Glucose, Bld: 117 mg/dL (ref 70–140)
POTASSIUM: 3.6 mmol/L (ref 3.5–5.1)
Sodium: 140 mmol/L (ref 136–145)
TOTAL PROTEIN: 7.5 g/dL (ref 6.4–8.3)

## 2017-04-24 LAB — TSH: TSH: 1.922 u[IU]/mL (ref 0.308–3.960)

## 2017-04-24 NOTE — Assessment & Plan Note (Signed)
Labs are repeated today.  White blood cell count is 0.7 with an ANC of 0.2.  Hemoglobin is stable at 10.8 and platelets have improved slightly to 97,000.  She has not received Granix since 04/10/2017.  The patient underwent a bone marrow biopsy on 04/21/2017.  She is here to discuss the results today.  The patient was seen with Dr. Earlie Server.  Bone marrow biopsy results were discussed with the patient and her husband which shows hypercellular bone marrow for age with significant increase in myeloblastic cells.  There is a significant increase in myeloblasts excels up to 60%.  This finding could be consistent with myelodysplastic syndrome or acute myeloid leukemia.  The patient had a recent Granix which could skew the results.   A call was placed to Dr. Phill Myron at [facility] and the case was reviewed over the phone.  The patient can follow-up as an outpatient as long as she remains afebrile.  The patient will be seen on 04/28/2017.  Their office will call the patient with the time and directions to the office.\  The patient was advised to monitor her temperature and if she develops a fever of 100.5 or higher, she was advised to report to the emergency room for evaluation.  She may need to be transferred to [facility] at that time.  I have left her follow-up visit open in our office.  She will contact us when she has had evaluation for her possible AML.  The patient was advised to call immediately if she has any concerning symptoms in the interval. The patient voices understanding of current disease status and treatment options and is in agreement with the current care plan. All questions were answered. The patient knows to call the clinic with any problems, questions or concerns. We can certainly see the patient much sooner if necessary.

## 2017-04-24 NOTE — Progress Notes (Signed)
[facility] OFFICE PROGRESS NOTE  Lawerance Cruel, MD [place] [address]  DIAGNOSIS: Recurrent non-small cell lung cancer initially diagnosed as stage IIIA (T1b., N2, M0) non-small cell lung cancer, squamous cell carcinoma presented with right upper lobe lung nodule in addition to mediastinal lymphadenopathy diagnosed in November of 2014.  PRIOR THERAPY: 1) Concurrent chemoradiation with weekly carboplatin for AUC of 2 and paclitaxel 45 mg/M2, status post 8 cycles, last dose was given 03/15/2013 with partial response. 2) Consolidation chemotherapy with carboplatin for AUC of 5 and paclitaxel 175 mg/M2 every 3 weeks with Neulasta support, status post 3 cycles. First dose 05/03/2013. Last dose was given 06/14/2013.  CURRENT THERAPY: Systemic chemotherapy with carboplatin for AUC of 5, paclitaxel 175 mg/M2 and Keytruda 200 mg IV every 3 weeks. She has not yet started chemotherapy due to pancytopenia.  INTERVAL HISTORY: Dawn Mercer 72 y.o. female returns for routine follow-up visit accompanied by her husband.  Patient reports that she is feeling better overall, but not 100%.  She still has some dizziness after walking.  The patient denies fevers and chills.  Denies chest pain,, cough, hemoptysis.  She has her baseline shortness of breath and wears home oxygen.  Denies nausea, vomiting, constipation, diarrhea.  Denies bleeding.  Her chemotherapy has remained on hold due to pancytopenia.  The patient had a recent bone marrow biopsy due to pancytopenia and is here to discuss the results.  MEDICAL HISTORY: Past Medical History:  Diagnosis Date  . Anxiety   . Bronchitis    Hx: of  . 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 Chest congestion 04/11/2016  . Claustrophobia   . COPD (chronic obstructive pulmonary disease) ([place])   . GERD (gastroesophageal reflux disease)   . H/O hiatal hernia   . Hay fever    Hx: of  . History of  radiation therapy 01/28/13-03/23/13   63 gray to right upper lobe  . Hypothyroidism   . IBS (irritable bowel syndrome)    Hx: of  . Lung cancer, upper lobe ([place]) dx'd 12/2012   chemo and XRt complete  . 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 Pneumonia    years ago  . Seizures ([place])    Hx: of over 40 years ago  . Shortness of breath    Hx: of with exertion  . Status post radiation therapy 10/1999  . 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  MEDICATIONS:  Current Outpatient Medications  Medication Sig Dispense Refill  . albuterol (PROVENTIL HFA;VENTOLIN HFA) 108 (90 Base) MCG/ACT inhaler Inhale 2 puffs into the lungs every 6 (six) hours as needed for wheezing or shortness of breath.    Marland Kitchen albuterol (PROVENTIL) (2.5 MG/3ML) 0.083% nebulizer solution Take 3 mLs (2.5 mg total) by nebulization every 6 (six) hours as needed for wheezing or shortness of breath. 300 mL 11  . ALPRAZolam (XANAX) 0.5 MG tablet Take 0.5 tablets (0.25 mg total) by mouth 2 (two) times daily as needed for anxiety. 30 tablet 0  . aspirin EC 325 MG tablet Take 325 mg by mouth daily as needed for mild pain or moderate pain.    Marland Kitchen aspirin EC 81 MG tablet Take 81 mg by mouth daily.    . Cholecalciferol (VITAMIN D3) 1000 units CAPS Take 1,000 Units by mouth daily.    . cyanocobalamin (,VITAMIN B-12,)  1000 MCG/ML injection Inject 1,000 mcg into the muscle every 30 (thirty) days.    . furosemide (LASIX) 40 MG tablet Take 1 tablet (40 mg total) by mouth daily. 30 tablet 0  . Glycopyrrolate-Formoterol (BEVESPI AEROSPHERE) 9-4.8 MCG/ACT AERO Inhale 2 Inhalers into the lungs 2 (two) times daily. Inhale 2 puffs daily, rinse mouth after use. 1 Inhaler 5  . guaiFENesin (MUCINEX) 600 MG 12 hr tablet Take 1 tablet (600 mg total) by mouth 2 (two) times daily as needed for to loosen phlegm. 30 tablet 0  . ipratropium-albuterol (DUONEB) 0.5-2.5 (3) MG/3ML SOLN Take 3 mLs  by nebulization every 4 (four) hours as needed. 360 mL 11  . iron polysaccharides (NIFEREX) 150 MG capsule Take 150 mg daily by mouth.    . levothyroxine (SYNTHROID, LEVOTHROID) 125 MCG tablet Take 62.5-125 mcg by mouth See admin instructions. Take 125 mcg daily except Sundays take 62.5 mcg    . OXYGEN 2lpm with rest and 3lpm with exertion    . RABEprazole (ACIPHEX) 20 MG tablet Take 30-60 min before first meal of the day (Patient taking differently: Take 30-60 mg by mouth daily. Before first meal of the day) 30 tablet 2   No current facility-administered medications for Dawn visit.     SURGICAL HISTORY:  Past Surgical History:  Procedure Laterality Date  . CHOLECYSTECTOMY N/A 08/03/2013   Procedure: LAPAROSCOPIC CHOLECYSTECTOMY;  Surgeon: Harl Bowie, MD;  Location: [facility];  Service: General;  Laterality: N/A;  . COLONOSCOPY W/ BIOPSIES AND POLYPECTOMY     Hx: of  . IR GENERIC HISTORICAL  04/11/2016   IR REMOVAL TUN ACCESS W/ PORT W/O FL MOD SED 04/11/2016 Arne Cleveland, MD [facility]INTER[facility]  . KYPHOPLASTY N/A 12/06/2015   Procedure: THORACIC 5 KYPHOPLASTY;  Surgeon: Melina Schools, MD;  Location: [facility];  Service: Orthopedics;  Laterality: N/A;  . LASER ABLATION  04/2004   for VIN III  . PORTACATH PLACEMENT    . TUBAL LIGATION    . VIDEO BRONCHOSCOPY Bilateral 02/14/2017   Procedure: VIDEO BRONCHOSCOPY WITH FLUORO;  Surgeon: Juanito Doom, MD;  Location: [facility];  Service: Cardiopulmonary;  Laterality: Bilateral;  . VIDEO BRONCHOSCOPY WITH ENDOBRONCHIAL NAVIGATION N/A 12/31/2012   Procedure: VIDEO BRONCHOSCOPY WITH ENDOBRONCHIAL NAVIGATION;  Surgeon: Melrose Nakayama, MD;  Location: [facility];  Service: Thoracic;  Laterality: N/A;  . VIDEO BRONCHOSCOPY WITH ENDOBRONCHIAL ULTRASOUND N/A 12/31/2012   Procedure: VIDEO BRONCHOSCOPY WITH ENDOBRONCHIAL ULTRASOUND;  Surgeon: Melrose Nakayama, MD;  Location: [facility];  Service: Thoracic;  Laterality: N/A;    REVIEW OF SYSTEMS:    Review of Systems  Constitutional: Negative for appetite change, chills, fatigue, fever and unexpected weight change.  HENT:   Negative for mouth sores, nosebleeds, sore throat and trouble swallowing.   Eyes: Negative for eye problems and icterus.  Respiratory: Negative for cough, hemoptysis, and wheezing.  She has her baseline shortness of breath and wears home oxygen. Cardiovascular: Negative for chest pain and leg swelling.  Gastrointestinal: Negative for abdominal pain, constipation, diarrhea, nausea and vomiting.  Genitourinary: Negative for bladder incontinence, difficulty urinating, dysuria, frequency and hematuria.   Musculoskeletal: Negative for back pain, neck pain and neck stiffness.  Skin: Negative for itching and rash.  Neurological: Negative for extremity weakness, gait problem, headaches, light-headedness and seizures. Positive for dizziness after moving. Hematological: Negative for adenopathy. Does not bruise/bleed easily.  Psychiatric/Behavioral: Negative for confusion, depression and sleep disturbance. The patient is not nervous/anxious.     PHYSICAL EXAMINATION:  Blood pressure (!) 98/52, pulse (!) 111, temperature 98 F (36.7 C), temperature source Oral, resp. rate 18, height _0  (1.651 m), SpO2 95 %.  ECOG PERFORMANCE STATUS: 1 - Symptomatic but completely ambulatory  Physical Exam  Constitutional: Oriented to person, place, and time and well-developed, well-nourished, and in no distress. No distress.  HENT:  Head: Normocephalic and atraumatic.  Mouth/Throat: Oropharynx is clear and moist. No oropharyngeal exudate.  Eyes: Conjunctivae are normal. Right eye exhibits no discharge. Left eye exhibits no discharge. No scleral icterus.  Neck: Normal range of motion. Neck supple.  Cardiovascular: Normal rate, regular rhythm, normal heart sounds and intact distal pulses.   Pulmonary/Chest: Effort normal and breath sounds normal. No respiratory distress. No wheezes. No  rales.  Abdominal: Soft. Bowel sounds are normal. Exhibits no distension and no mass. There is no tenderness.  Musculoskeletal: Normal range of motion. Exhibits no edema.  Lymphadenopathy:    No cervical adenopathy.  Neurological: Alert and oriented to person, place, and time. Exhibits normal muscle tone. Coordination normal.  Skin: Skin is warm and dry. No rash noted. Not diaphoretic. No erythema. No pallor. No petechiae. Psychiatric: Mood, memory and judgment normal.  Vitals reviewed.  LABORATORY DATA: Lab Results  Component Value Date   WBC 0.7 (LL) 04/24/2017   HGB 8.8 (L) 03/24/2017   HCT 32.1 (L) 04/24/2017   MCV 90.8 04/24/2017   PLT 97 (L) 04/24/2017      Chemistry      Component Value Date/Time   NA 140 04/24/2017 1309   NA 141 01/29/2017 0944   K 3.6 04/24/2017 1309   K 4.5 01/29/2017 0944   CL 98 04/24/2017 1309   CO2 28 04/24/2017 1309   CO2 27 01/29/2017 0944   BUN 17 04/24/2017 1309   BUN 8.1 01/29/2017 0944   CREATININE 0.94 04/24/2017 1309   CREATININE 0.8 01/29/2017 0944      Component Value Date/Time   CALCIUM 9.6 04/24/2017 1309   CALCIUM 9.1 01/29/2017 0944   ALKPHOS 91 04/24/2017 1309   ALKPHOS 77 01/29/2017 0944   AST 17 04/24/2017 1309   AST 18 01/29/2017 0944   ALT 18 04/24/2017 1309   ALT 14 01/29/2017 0944   BILITOT 1.7 (H) 04/24/2017 1309   BILITOT 1.06 01/29/2017 0944       RADIOGRAPHIC STUDIES:  No results found.  PATHOLOGY:  Diagnosis Bone Marrow, Aspirate,Biopsy, and Clot - HYPERCELLULAR BONE MARROW FOR AGE WITH SIGNIFICANT INCREASE IN MYELOBLASTIC CELLS. - SEE NOTE. PERIPHERAL BLOOD: - PANCYTOPENIA. Diagnosis Note The bone marrow shows mixture of myeloid cell types but with a significant increase in myeloblastic cells (up to 60%). Cytogenetic analysis shows numerous clonal abnormalities typically seen in either myelodysplastic syndrome or acute myeloid leukemia (AML). While the myeloblastic cell count exceeds the  threshold needed for the diagnosis of AML, recent therapy by growth factor hinders a definitive diagnosis since it is known to significantly increase blastic cells in some patients. As a result, I am uncertain of the the true severity or degree of involvement by the myeloid neoplastic process at Dawn time. A repeat biopsy is strongly recommended after growth factor effect has diminished.  ADDITIONAL DATA / TESTING: Specimen was sent for cytogenetic analysis and a separate report will follows. Flow cytometric analysis [phone]) shows a significant blastic population representing 60% of all cells with expression of HLA-DR, CD13, CD34, CD117 and myeloperoxidase consistent with a myeloid phenotype. (BNS:gt, 04/22/17)  ASSESSMENT/PLAN:  Malignant neoplasm of hilus of right lung (  [employer]) Dawn is a very pleasant 72 year old white female withrecurrent non-small cell lung cancer initially diagnosed asstage IIIAnon-small cell lung cancer status post a course of concurrent chemoradiation followed by consolidation chemotherapy with carboplatin and paclitaxel and she has been on observation for close to4years. Her recent CT scan of the chest showed increasing soft tissue in the right hilum suspicious for disease recurrence. The patient underwent repeat bronchoscopy under the care of Dr. Lake Bells and the final pathology was consistent with recurrent non-small cell lung cancer, squamous cell carcinoma. The patient was scheduled to begin palliative systemic chemotherapy with carboplatin for AUC of 5, paclitaxel 175 mg/M2 and Keytruda 200 mg IV every 3 weeks with Neulasta support.  However, Dawn has been delayed due to hospital admission and persistent pancytopenia. The patient received 2 units of packed red blood cells on 04/10/2017 along with Granix 480 mcg subcutaneously times 2 days.  Last dose of Granix was given on 04/10/2017. The patient returns Dawn week for evaluation and repeat lab work. CBC reviewed and  treatment for her lung cancer remains on hold.  Pancytopenia ([place]) Labs are repeated today.  White blood cell count is 0.7 with an ANC of 0.2.  Hemoglobin is stable at 10.8 and platelets have improved slightly to 97,000.  She has not received Granix since 04/10/2017.  The patient underwent a bone marrow biopsy on 04/21/2017.  She is here to discuss the results today.  The patient was seen with Dr. Earlie Server.  Bone marrow biopsy results were discussed with the patient and her husband which shows hypercellular bone marrow for age with significant increase in myeloblastic cells.  There is a significant increase in myeloblasts excels up to 60%.  Dawn finding could be consistent with myelodysplastic syndrome or acute myeloid leukemia.  The patient had a recent Granix which could skew the results.   A call was placed to Dr. Phill Myron at [facility] and the case was reviewed over the phone.  The patient can follow-up as an outpatient as long as she remains afebrile.  The patient will be seen on 04/28/2017.  Their office will call the patient with the time and directions to the office.\  The patient was advised to monitor her temperature and if she develops a fever of 100.5 or higher, she was advised to report to the emergency room for evaluation.  She may need to be transferred to [facility] at that time.  I have left her follow-up visit open in our office.  She will contact us when she has had evaluation for her possible AML.  The patient was advised to call immediately if she has any concerning symptoms in the interval. The patient voices understanding of current disease status and treatment options and is in agreement with the current care plan. All questions were answered. The patient knows to call the clinic with any problems, questions or concerns. We can certainly see the patient much sooner if necessary.   No orders of the defined types were placed in Dawn  encounter.  Mikey Bussing, DNP, AGPCNP-BC, AOCNP 04/24/17   ADDENDUM: Hematology/Oncology Attending: I had a face-to-face encounter with the patient.  I recommended her care plan.  Dawn is a very pleasant 72 years old white female with recurrent non-small cell lung cancer that was initially diagnosed as a stage IIIa in November 2014 status post concurrent chemoradiation followed by consolidation chemotherapy completed on April 2015.  The patient has evidence for disease recurrence 2 months  ago and she was expected to start a course of systemic chemotherapy but she was noted to have significant pancytopenia. 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  The results of the bone marrow biopsy was complicated with previous dose of Granix on 04/10/2017 but the cytogenetics also showed significant abnormalities suggestive of acute myeloid leukemia. I had a lengthy discussion with the patient and her husband about her condition and further treatment options.  I recommended for the patient to see a hematologist at [facility] for evaluation and management of her acute leukemia.  We will put the treatment for lung cancer on hold for now as she has only local recurrence. I will see her on as-needed basis at Dawn point until she completes her treatment for the acute leukemia at [facility]. The patient was advised to call immediately if she has any concerning symptoms in the interval.  Disclaimer: Dawn note was dictated with voice recognition software. Similar sounding words can inadvertently be transcribed and may be missed upon review. Eilleen Kempf, MD 04/25/17

## 2017-04-24 NOTE — Assessment & Plan Note (Addendum)
This is a very pleasant 72 year old white female withrecurrent non-small cell lung cancer initially diagnosed asstage IIIAnon-small cell lung cancer status post a course of concurrent chemoradiation followed by consolidation chemotherapy with carboplatin and paclitaxel and she has been on observation for close to4years. Her recent CT scan of the chest showed increasing soft tissue in the right hilum suspicious for disease recurrence. The patient underwent repeat bronchoscopy under the care of Dr. Lake Bells and the final pathology was consistent with recurrent non-small cell lung cancer, squamous cell carcinoma. The patient was scheduled to begin palliative systemic chemotherapy with carboplatin for AUC of 5, paclitaxel 175 mg/M2 and Keytruda 200 mg IV every 3 weeks with Neulasta support.  However, this has been delayed due to hospital admission and persistent pancytopenia. The patient received 2 units of packed red blood cells on 04/10/2017 along with Granix 480 mcg subcutaneously times 2 days.  Last dose of Granix was given on 04/10/2017. The patient returns this week for evaluation and repeat lab work. CBC reviewed and treatment for her lung cancer remains on hold.

## 2017-04-25 ENCOUNTER — Other Ambulatory Visit (HOSPITAL_COMMUNITY): Payer: Medicare Other

## 2017-04-25 ENCOUNTER — Ambulatory Visit (HOSPITAL_COMMUNITY): Payer: Medicare Other

## 2017-04-29 ENCOUNTER — Other Ambulatory Visit: Payer: Self-pay

## 2017-04-29 ENCOUNTER — Encounter (HOSPITAL_COMMUNITY): Payer: Self-pay | Admitting: Emergency Medicine

## 2017-04-29 ENCOUNTER — Emergency Department (HOSPITAL_COMMUNITY): Payer: Medicare Other

## 2017-04-29 ENCOUNTER — Observation Stay (HOSPITAL_COMMUNITY)
Admission: EM | Admit: 2017-04-29 | Discharge: 2017-05-02 | Disposition: A | Discharge status: Home / Self Care | Payer: Medicare Other | Attending: Internal Medicine | Admitting: Internal Medicine

## 2017-04-29 DIAGNOSIS — J449 Chronic obstructive pulmonary disease, unspecified: Secondary | ICD-10-CM | POA: Diagnosis present

## 2017-04-29 DIAGNOSIS — Z87891 Personal history of nicotine dependence: Secondary | ICD-10-CM | POA: Diagnosis not present

## 2017-04-29 DIAGNOSIS — Z79899 Other long term (current) drug therapy: Secondary | ICD-10-CM | POA: Diagnosis not present

## 2017-04-29 DIAGNOSIS — I5030 Unspecified diastolic (congestive) heart failure: Secondary | ICD-10-CM | POA: Diagnosis not present

## 2017-04-29 DIAGNOSIS — E039 Hypothyroidism, unspecified: Secondary | ICD-10-CM | POA: Diagnosis not present

## 2017-04-29 DIAGNOSIS — C9590 Leukemia, unspecified not having achieved remission: Secondary | ICD-10-CM | POA: Diagnosis not present

## 2017-04-29 DIAGNOSIS — J9611 Chronic respiratory failure with hypoxia: Secondary | ICD-10-CM | POA: Diagnosis not present

## 2017-04-29 DIAGNOSIS — R42 Dizziness and giddiness: Principal | ICD-10-CM

## 2017-04-29 DIAGNOSIS — I5032 Chronic diastolic (congestive) heart failure: Secondary | ICD-10-CM

## 2017-04-29 DIAGNOSIS — Z7982 Long term (current) use of aspirin: Secondary | ICD-10-CM | POA: Insufficient documentation

## 2017-04-29 HISTORY — DX: Leukemia, unspecified not having achieved remission: C95.90

## 2017-04-29 LAB — CBG MONITORING, ED: GLUCOSE-CAPILLARY: 106 mg/dL — AB (ref 65–99)

## 2017-04-29 LAB — HEPATIC FUNCTION PANEL
ALBUMIN: 3.4 g/dL — AB (ref 3.5–5.0)
ALT: 18 U/L (ref 14–54)
AST: 26 U/L (ref 15–41)
Alkaline Phosphatase: 75 U/L (ref 38–126)
Bilirubin, Direct: 0.3 mg/dL (ref 0.1–0.5)
Indirect Bilirubin: 1.7 mg/dL — ABNORMAL HIGH (ref 0.3–0.9)
TOTAL PROTEIN: 7.2 g/dL (ref 6.5–8.1)
Total Bilirubin: 2 mg/dL — ABNORMAL HIGH (ref 0.3–1.2)

## 2017-04-29 LAB — URINALYSIS, ROUTINE W REFLEX MICROSCOPIC
Bilirubin Urine: NEGATIVE
Glucose, UA: NEGATIVE mg/dL
Hgb urine dipstick: NEGATIVE
KETONES UR: NEGATIVE mg/dL
LEUKOCYTES UA: NEGATIVE
NITRITE: NEGATIVE
PH: 6 (ref 5.0–8.0)
Protein, ur: NEGATIVE mg/dL
Specific Gravity, Urine: 1.017 (ref 1.005–1.030)

## 2017-04-29 LAB — BASIC METABOLIC PANEL
ANION GAP: 16 — AB (ref 5–15)
BUN: 20 mg/dL (ref 6–20)
CO2: 23 mmol/L (ref 22–32)
Calcium: 8.5 mg/dL — ABNORMAL LOW (ref 8.9–10.3)
Chloride: 96 mmol/L — ABNORMAL LOW (ref 101–111)
Creatinine, Ser: 0.83 mg/dL (ref 0.44–1.00)
GFR calc Af Amer: >60 mL/min (ref >60)
Glucose, Bld: 107 mg/dL — ABNORMAL HIGH (ref 65–99)
POTASSIUM: 3.9 mmol/L (ref 3.5–5.1)
SODIUM: 135 mmol/L (ref 135–145)

## 2017-04-29 LAB — I-STAT CHEM 8, ED
BUN: 18 mg/dL (ref 6–20)
Calcium, Ion: 1.02 mmol/L — ABNORMAL LOW (ref 1.15–1.40)
Chloride: 95 mmol/L — ABNORMAL LOW (ref 101–111)
Creatinine, Ser: 0.9 mg/dL (ref 0.44–1.00)
Glucose, Bld: 105 mg/dL — ABNORMAL HIGH (ref 65–99)
HEMATOCRIT: 26 % — AB (ref 36.0–46.0)
HEMOGLOBIN: 8.8 g/dL — AB (ref 12.0–15.0)
Potassium: 3.4 mmol/L — ABNORMAL LOW (ref 3.5–5.1)
SODIUM: 137 mmol/L (ref 135–145)
TCO2: 27 mmol/L (ref 22–32)

## 2017-04-29 LAB — DIFFERENTIAL
BASOS PCT: 0 %
BLASTS: 0 %
Band Neutrophils: 0 %
Basophils Absolute: 0 10*3/uL (ref 0.0–0.1)
Eosinophils Absolute: 0 10*3/uL (ref 0.0–0.7)
Eosinophils Relative: 4 %
Lymphocytes Relative: 73 %
Lymphs Abs: 0.4 10*3/uL — ABNORMAL LOW (ref 0.7–4.0)
METAMYELOCYTES PCT: 0 %
MONOS PCT: 2 %
MYELOCYTES: 0 %
Monocytes Absolute: 0 10*3/uL — ABNORMAL LOW (ref 0.1–1.0)
NEUTROS ABS: 0.1 10*3/uL — AB (ref 1.7–7.7)
NRBC: 0 /100{WBCs}
Neutrophils Relative %: 21 %
OTHER: 0 %
PROMYELOCYTES ABS: 0 %

## 2017-04-29 LAB — CBC
HEMATOCRIT: 27.3 % — AB (ref 36.0–46.0)
HEMOGLOBIN: 9.2 g/dL — AB (ref 12.0–15.0)
MCH: 30.9 pg (ref 26.0–34.0)
MCHC: 33.7 g/dL (ref 30.0–36.0)
MCV: 91.6 fL (ref 78.0–100.0)
Platelets: 76 10*3/uL — ABNORMAL LOW (ref 150–400)
RBC: 2.98 MIL/uL — ABNORMAL LOW (ref 3.87–5.11)
RDW: 22.2 % — AB (ref 11.5–15.5)
WBC: 0.5 10*3/uL — AB (ref 4.0–10.5)

## 2017-04-29 MED ORDER — ONDANSETRON HCL 4 MG/2ML IJ SOLN
INTRAMUSCULAR | Status: AC
  Filled 2017-04-29: qty 2

## 2017-04-29 MED ORDER — ONDANSETRON HCL 4 MG/2ML IJ SOLN
4.0000 mg | Freq: Once | INTRAMUSCULAR | Status: AC
  Administered 2017-04-29: 22:00:00 via INTRAVENOUS

## 2017-04-29 MED ORDER — SODIUM CHLORIDE 0.9 % IV BOLUS (SEPSIS)
1000.0000 mL | Freq: Once | INTRAVENOUS | Status: AC
  Administered 2017-04-29: 1000 mL via INTRAVENOUS

## 2017-04-29 NOTE — ED Provider Notes (Signed)
[facility] EMERGENCY DEPARTMENT Provider Note   CSN: [id number] Arrival date & time: 04/29/17  2135     History   Chief Complaint Chief Complaint  Patient presents with  . Dizziness    HPI Dawn Mercer is a 72 y.o. female.  Patient complains of dizziness today.  She states she feels lightheaded the room was not spinning.  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  She saw the oncologist yesterday   The history is provided by the patient. No language interpreter was used.  Dizziness  Quality:  Lightheadedness Severity:  Moderate Onset quality:  Sudden Timing:  Constant Progression:  Unchanged Chronicity:  New Associated symptoms: no chest pain, no diarrhea and no headaches     Past Medical History:  Diagnosis Date  . Anxiety   . Bronchitis    Hx: of  . 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 Chest congestion 04/11/2016  . Claustrophobia   . COPD (chronic obstructive pulmonary disease) ([place])   . GERD (gastroesophageal reflux disease)   . H/O hiatal hernia   . Hay fever    Hx: of  . History of radiation therapy 01/28/13-03/23/13   63 gray to right upper lobe  . Hypothyroidism   . IBS (irritable bowel syndrome)    Hx: of  . Leukemia ([place])   . Lung cancer, upper lobe ([place]) dx'd 12/2012   chemo and XRt complete  . 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 Pneumonia    years ago  . Seizures ([place])    Hx: of over 40 years ago  . Shortness of breath    Hx: of with exertion  . Status post radiation therapy 10/1999  . 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 Acute diastolic CHF (congestive heart failure) ([place]) 03/19/2017  . Bronchiectasis ([place]) 03/19/2017  . Pancytopenia ([place]) 03/19/2017  . Neutropenia ([place]) 03/12/2017  . 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 Obesity (BMI 30-39.9) 08/27/2016  . Gastroesophageal  reflux disease / clinical dx with overt HB    . Acute on chronic respiratory failure with hypoxia ([place]) 06/16/2016  . COPD with acute exacerbation ([place]) 06/16/2016  . COPD exacerbation ([place]) 06/16/2016  . Chest pain 06/16/2016  . Hypothyroidism 06/16/2016  . Compression fracture of body of thoracic vertebra ([place]) 12/06/2015  . Compression fracture of thoracic vertebra ([facility]) 11/20/2015  . Back pain, thoracic 11/20/2015  . COPD GOLD II with disporportionate decrease dlco  05/31/2015  . Chronic respiratory failure with hypoxia ([place]) 05/31/2015  . Dyspnea 05/31/2015  . Symptomatic cholelithiasis 07/23/2013  . History of cervical cancer 04/26/2013  . Malignant neoplasm of upper lobe of lung ([place]) 01/14/2013    Past Surgical History:  Procedure Laterality Date  . CHOLECYSTECTOMY N/A 08/03/2013   Procedure: LAPAROSCOPIC CHOLECYSTECTOMY;  Surgeon: Harl Bowie, MD;  Location: [facility];  Service: General;  Laterality: N/A;  . COLONOSCOPY W/ BIOPSIES AND POLYPECTOMY     Hx: of  . IR GENERIC HISTORICAL  04/11/2016   IR REMOVAL TUN ACCESS W/ PORT W/O FL MOD SED 04/11/2016 Arne Cleveland, MD [facility]-INTERV RAD  . KYPHOPLASTY N/A 12/06/2015   Procedure: THORACIC 5 KYPHOPLASTY;  Surgeon: Melina Schools, MD;  Location: [facility];  Service: Orthopedics;  Laterality: N/A;  . LASER ABLATION  04/2004   for VIN  III  . PORTACATH PLACEMENT    . TUBAL LIGATION    . VIDEO BRONCHOSCOPY Bilateral 02/14/2017   Procedure: VIDEO BRONCHOSCOPY WITH FLUORO;  Surgeon: Juanito Doom, MD;  Location[facility];  Service: Cardiopulmonary;  Laterality: Bilateral;  . VIDEO BRONCHOSCOPY WITH ENDOBRONCHIAL NAVIGATION N/A 12/31/2012   Procedure: VIDEO BRONCHOSCOPY WITH ENDOBRONCHIAL NAVIGATION;  Surgeon: Melrose Nakayama, MD;  Location: [facility];  Service: Thoracic;  Laterality: N/A;  . VIDEO BRONCHOSCOPY WITH ENDOBRONCHIAL ULTRASOUND N/A 12/31/2012   Procedure: VIDEO BRONCHOSCOPY WITH ENDOBRONCHIAL ULTRASOUND;  Surgeon: Melrose Nakayama, MD;  Location: [facility];  Service: Thoracic;  Laterality: N/A;    OB History    No data available       Home Medications    Prior to Admission medications   Medication Sig Start Date End Date Taking? Authorizing Provider  albuterol (PROVENTIL HFA;VENTOLIN HFA) 108 (90 Base) MCG/ACT inhaler Inhale 2 puffs into the lungs every 6 (six) hours as needed for wheezing or shortness of breath.   Yes [provider]  albuterol (PROVENTIL) (2.5 MG/3ML) 0.083% nebulizer solution Take 3 mLs (2.5 mg total) by nebulization every 6 (six) hours as needed for wheezing or shortness of breath. 03/11/17  Yes Juanito Doom, MD  ALPRAZolam [name]) 0.5 MG tablet Take 0.5 tablets (0.25 mg total) by mouth 2 (two) times daily as needed for anxiety. 04/09/17  Yes Owens Shark, NP  aspirin EC 325 MG tablet Take 325 mg by mouth daily as needed for mild pain or moderate pain.   Yes [provider]  aspirin EC 81 MG tablet Take 81 mg by mouth daily.   Yes [provider]  Cholecalciferol (VITAMIN D3) 1000 units CAPS Take 1,000 Units by mouth daily.   Yes [provider]  cyanocobalamin (,VITAMIN B-12,) 1000 MCG/ML injection Inject 1,000 mcg into the muscle every 30 (thirty) days.   Yes [provider]  furosemide (LASIX) 40 MG tablet Take 1 tablet (40 mg total) by mouth daily. 03/25/17  Yes Oretha Milch D, MD  Glycopyrrolate-Formoterol (BEVESPI AEROSPHERE) 9-4.8 MCG/ACT AERO Inhale 2 Inhalers into the lungs 2 (two) times daily. Inhale 2 puffs daily, rinse mouth after use. 03/12/17  Yes Juanito Doom, MD  guaiFENesin (MUCINEX) 600 MG 12 hr tablet Take 1 tablet (600 mg total) by mouth 2 (two) times daily as needed for to loosen phlegm. 03/24/17  Yes Oretha Milch D, MD  iron polysaccharides (NIFEREX) 150 MG capsule Take 150 mg daily by mouth.   Yes [provider]  levothyroxine (SYNTHROID, LEVOTHROID) 125 MCG tablet Take 62.5-125 mcg by mouth See  admin instructions. Take 125 mcg daily except Sundays take 62.5 mcg   Yes [provider]  OXYGEN 2lpm with rest and 3lpm with exertion    [provider]    Family History Family History  Problem Relation Age of Onset  . Lung cancer Brother   . Heart disease Father   . Cervical cancer Sister   . Heart disease Brother   . Cancer Brother        small cell lung cancer    Social History Social History   Tobacco Use  . Smoking status: Former Smoker    Packs/day: 1.00    Years: 15.00    Pack years: 15.00    Last attempt to quit: 02/25/1998    Years since quitting: 19.1  . Smokeless tobacco: Never Used  Substance Use Topics  . Alcohol use: No  Alcohol/week: 0.0 oz  . Drug use: No     Allergies   Latex and Codeine   Review of Systems Review of Systems  Constitutional: Negative for appetite change and fatigue.  HENT: Negative for congestion, ear discharge and sinus pressure.   Eyes: Negative for discharge.  Respiratory: Negative for cough.   Cardiovascular: Negative for chest pain.  Gastrointestinal: Negative for abdominal pain and diarrhea.  Genitourinary: Negative for frequency and hematuria.  Musculoskeletal: Negative for back pain.  Skin: Negative for rash.  Neurological: Positive for dizziness. Negative for seizures and headaches.  Psychiatric/Behavioral: Negative for hallucinations.     Physical Exam Updated Vital Signs BP 109/72 (BP Location: Right Arm)   Pulse (!) 102   Temp 97.9 F (36.6 C) (Oral)   Resp (!) 25   Ht 5\' 5"  (1.651 m)   Wt 78.5 kg (173 lb)   SpO2 97%   BMI 28.79 kg/m   Physical Exam  Constitutional: She is oriented to person, place, and time. She appears well-developed.  HENT:  Head: Normocephalic.  Eyes: Conjunctivae and EOM are normal. No scleral icterus.  Neck: Neck supple. No thyromegaly present.  Cardiovascular: Normal rate and regular rhythm. Exam reveals no gallop and no friction rub.  No murmur  heard. Pulmonary/Chest: No stridor. She has no wheezes. She has no rales. She exhibits no tenderness.  Abdominal: She exhibits no distension. There is no tenderness. There is no rebound.  Musculoskeletal: Normal range of motion. She exhibits no edema.  Lymphadenopathy:    She has no cervical adenopathy.  Neurological: She is oriented to person, place, and time. She exhibits normal muscle tone. Coordination normal.  Skin: No rash noted. No erythema.  Psychiatric: She has a normal mood and affect. Her behavior is normal.     ED Treatments / Results  Labs (all labs ordered are listed, but only abnormal results are displayed) Labs Reviewed  BASIC METABOLIC PANEL - Abnormal; Notable for the following components:      Result Value   Chloride 96 (*)    Glucose, Bld 107 (*)    Calcium 8.5 (*)    Anion gap 16 (*)    All other components within normal limits  CBC - Abnormal; Notable for the following components:   WBC 0.5 (*)    RBC 2.98 (*)    Hemoglobin 9.2 (*)    HCT 27.3 (*)    RDW 22.2 (*)    Platelets 76 (*)    All other components within normal limits  URINALYSIS, ROUTINE W REFLEX MICROSCOPIC - Abnormal; Notable for the following components:   APPearance HAZY (*)    All other components within normal limits  HEPATIC FUNCTION PANEL - Abnormal; Notable for the following components:   Albumin 3.4 (*)    Total Bilirubin 2.0 (*)    Indirect Bilirubin 1.7 (*)    All other components within normal limits  DIFFERENTIAL - Abnormal; Notable for the following components:   Neutro Abs 0.1 (*)    Lymphs Abs 0.4 (*)    Monocytes Absolute 0.0 (*)    All other components within normal limits  CBG MONITORING, ED - Abnormal; Notable for the following components:   Glucose-Capillary 106 (*)    All other components within normal limits  I-STAT CHEM 8, ED - Abnormal; Notable for the following components:   Potassium 3.4 (*)    Chloride 95 (*)    Glucose, Bld 105 (*)    Calcium, Ion 1.02  (*)  Hemoglobin 8.8 (*)    HCT 26.0 (*)    All other components within normal limits  TYPE AND SCREEN    EKG  EKG Interpretation  Date/Time:  Tuesday April 29 2017 21:43:49 EST Ventricular Rate:  89 PR Interval:    QRS Duration: 81 QT Interval:  389 QTC Calculation: 474 R Axis:   61 Text Interpretation:  Sinus rhythm Prolonged PR interval Borderline T abnormalities, anterior leads Confirmed by Milton Ferguson [phone]) on 04/29/2017 10:47:51 PM       Radiology No results found.  Procedures Procedures (including critical care time)  Medications Ordered in ED Medications  sodium chloride 0.9 % bolus 1,000 mL (1,000 mLs Intravenous New Bag/Given 04/29/17 2206)  ondansetron (ZOFRAN) injection 4 mg ( Intravenous Given 04/29/17 2215)     Initial Impression / Assessment and Plan / ED Course  I have reviewed the triage vital signs and the nursing notes.  Pertinent labs & imaging results that were available during my care of the patient were reviewed by me and considered in my medical decision making (see chart for details).     Patient with pancytopenia and leukemia and new onset dizziness of unknown cause.  Patient is afebrile.  She will be seen by the medicine doctor and admitted.  Presently [facility] is not excepting transfers  Final Clinical Impressions(s) / ED Diagnoses   Final diagnoses:  Dizziness    ED Discharge Orders    None       Milton Ferguson, MD 04/29/17 2351

## 2017-04-29 NOTE — ED Notes (Signed)
Pt. has stated she "will fall" if she stands up, and so the orthostatic vital signs were collected with an omission of the standing portion due to the patients request.

## 2017-04-29 NOTE — ED Triage Notes (Addendum)
Pt c/o dizziness and weakness upon waking this AM, states was normal last night. Feels as though the room is spinning. Denies hx of vertigo. Dx with Leukemia last week and is to be tx inpt at [facility] next week. States hx of low hgb with similar sx in the past.

## 2017-04-29 NOTE — ED Notes (Addendum)
CRITICAL VALUE ALERT  Critical Value: WBC 0.5  Date & Time Notied:  04/29/17 & 2315 hrs  Provider Notified: Dr. Roderic Palau  Orders Received/Actions taken: Neutropenic Precautions

## 2017-04-29 NOTE — ED Notes (Signed)
Pt. Stated they have claustrophobia, and has requested the door to her room remains open.

## 2017-04-30 ENCOUNTER — Encounter (HOSPITAL_COMMUNITY): Payer: Self-pay | Admitting: *Deleted

## 2017-04-30 ENCOUNTER — Ambulatory Visit: Payer: Medicare Other

## 2017-04-30 ENCOUNTER — Other Ambulatory Visit: Payer: Self-pay

## 2017-04-30 ENCOUNTER — Ambulatory Visit: Payer: Medicare Other | Admitting: Oncology

## 2017-04-30 ENCOUNTER — Encounter (HOSPITAL_COMMUNITY): Payer: Self-pay

## 2017-04-30 ENCOUNTER — Observation Stay (HOSPITAL_COMMUNITY): Payer: Medicare Other

## 2017-04-30 ENCOUNTER — Other Ambulatory Visit: Payer: Medicare Other

## 2017-04-30 DIAGNOSIS — R42 Dizziness and giddiness: Secondary | ICD-10-CM

## 2017-04-30 DIAGNOSIS — C9590 Leukemia, unspecified not having achieved remission: Secondary | ICD-10-CM

## 2017-04-30 LAB — CBC
HEMATOCRIT: 28.1 % — AB (ref 36.0–46.0)
Hemoglobin: 9.1 g/dL — ABNORMAL LOW (ref 12.0–15.0)
MCH: 30.3 pg (ref 26.0–34.0)
MCHC: 32.4 g/dL (ref 30.0–36.0)
MCV: 93.7 fL (ref 78.0–100.0)
PLATELETS: 73 10*3/uL — AB (ref 150–400)
RBC: 3 MIL/uL — ABNORMAL LOW (ref 3.87–5.11)
RDW: 22.9 % — AB (ref 11.5–15.5)
WBC: 0.7 10*3/uL — AB (ref 4.0–10.5)

## 2017-04-30 LAB — BASIC METABOLIC PANEL
Anion gap: 10 (ref 5–15)
BUN: 19 mg/dL (ref 6–20)
CALCIUM: 8 mg/dL — AB (ref 8.9–10.3)
CO2: 26 mmol/L (ref 22–32)
Chloride: 102 mmol/L (ref 101–111)
Creatinine, Ser: 0.87 mg/dL (ref 0.44–1.00)
GFR calc Af Amer: >60 mL/min (ref >60)
GLUCOSE: 113 mg/dL — AB (ref 65–99)
Potassium: 4.6 mmol/L (ref 3.5–5.1)
SODIUM: 138 mmol/L (ref 135–145)

## 2017-04-30 LAB — TYPE AND SCREEN
ABO/RH(D): O POS
Antibody Screen: NEGATIVE

## 2017-04-30 MED ORDER — GUAIFENESIN ER 600 MG PO TB12: 600.0000 mg | ORAL_TABLET | Freq: Two times a day (BID) | ORAL | Status: DC | PRN

## 2017-04-30 MED ORDER — LEVOTHYROXINE SODIUM 125 MCG PO TABS
125.0000 ug | ORAL_TABLET | ORAL | Status: DC
  Administered 2017-05-01 – 2017-05-02 (×2): 125 ug via ORAL
  Filled 2017-04-30 (×5): qty 1

## 2017-04-30 MED ORDER — ASPIRIN EC 81 MG PO TBEC
81.0000 mg | DELAYED_RELEASE_TABLET | Freq: Every day | ORAL | Status: DC
  Administered 2017-04-30 – 2017-05-02 (×3): 81 mg via ORAL
  Filled 2017-04-30 (×3): qty 1

## 2017-04-30 MED ORDER — ONDANSETRON HCL 4 MG/2ML IJ SOLN
4.0000 mg | Freq: Four times a day (QID) | INTRAMUSCULAR | Status: DC | PRN
  Administered 2017-04-30: 4 mg via INTRAVENOUS
  Filled 2017-04-30 (×2): qty 2

## 2017-04-30 MED ORDER — ALPRAZOLAM 0.5 MG PO TABS: 0.2500 mg | ORAL_TABLET | Freq: Two times a day (BID) | ORAL | Status: DC | PRN

## 2017-04-30 MED ORDER — POTASSIUM CHLORIDE 20 MEQ/15ML (10%) PO SOLN
40.0000 meq | Freq: Once | ORAL | Status: AC
  Administered 2017-04-30: 40 meq via ORAL
  Filled 2017-04-30: qty 30

## 2017-04-30 MED ORDER — ACETAMINOPHEN 650 MG RE SUPP: 650.0000 mg | Freq: Four times a day (QID) | RECTAL | Status: DC | PRN

## 2017-04-30 MED ORDER — POLYSACCHARIDE IRON COMPLEX 150 MG PO CAPS
150.0000 mg | ORAL_CAPSULE | Freq: Every day | ORAL | Status: DC
  Administered 2017-04-30 – 2017-05-02 (×3): 150 mg via ORAL
  Filled 2017-04-30 (×3): qty 1

## 2017-04-30 MED ORDER — VITAMIN D 1000 UNITS PO TABS
1000.0000 [IU] | ORAL_TABLET | Freq: Every day | ORAL | Status: DC
  Administered 2017-04-30 – 2017-05-02 (×3): 1000 [IU] via ORAL
  Filled 2017-04-30 (×3): qty 1

## 2017-04-30 MED ORDER — ACETAMINOPHEN 325 MG PO TABS
650.0000 mg | ORAL_TABLET | Freq: Four times a day (QID) | ORAL | Status: DC | PRN
  Administered 2017-04-30: 650 mg via ORAL
  Filled 2017-04-30: qty 2

## 2017-04-30 MED ORDER — ONDANSETRON HCL 4 MG PO TABS
4.0000 mg | ORAL_TABLET | Freq: Four times a day (QID) | ORAL | Status: DC | PRN
  Administered 2017-05-01: 4 mg via ORAL
  Filled 2017-04-30: qty 1

## 2017-04-30 MED ORDER — MECLIZINE HCL 12.5 MG PO TABS
25.0000 mg | ORAL_TABLET | Freq: Three times a day (TID) | ORAL | Status: DC
  Administered 2017-04-30 – 2017-05-02 (×9): 25 mg via ORAL
  Filled 2017-04-30 (×9): qty 2

## 2017-04-30 MED ORDER — LORAZEPAM 2 MG/ML IJ SOLN
0.5000 mg | Freq: Once | INTRAMUSCULAR | Status: AC
  Administered 2017-04-30: 0.5 mg via INTRAVENOUS
  Filled 2017-04-30: qty 1

## 2017-04-30 MED ORDER — LEVOTHYROXINE SODIUM 50 MCG PO TABS: 62.5000 ug | ORAL_TABLET | ORAL | Status: DC

## 2017-04-30 MED ORDER — LACTATED RINGERS IV SOLN
INTRAVENOUS | Status: AC
  Administered 2017-04-30: 01:00:00 via INTRAVENOUS

## 2017-04-30 NOTE — Progress Notes (Signed)
Patient seen and examined.  Admitted after midnight secondary to lightheadedness, dizziness and weakness.  Vital signs stable and currently without focal motor deficits on exam.  She reports he still feeling slightly dizzy and just not feeling well.  MRI has been reviewed and negative for any cerebellar and no reason or acute stroke.  Will continue supportive management, as needed meclizine and follow physical therapy evaluation.  Anticipate discharge back home in a.m. Please refer to H&P written by Dr. Manuella Ghazi for further info/details on admission.    Barton [facility] MD [phone]

## 2017-04-30 NOTE — Care Management Obs Status (Signed)
[facility] NOTIFICATION   Patient Details  Name: Dawn Mercer MRN: [id number] Date of Birth: [birth year]   Medicare Observation Status Notification Given:  Yes    Israa Caban, Chauncey [place], RN 04/30/2017, 12:07 PM

## 2017-04-30 NOTE — H&P (Signed)
History and Physical    Dawn Mercer UMP:[id number] DOB: Aug 27, 1945 DOA: 04/29/2017  PCP: Lawerance Cruel, MD   Patient coming from: Home  Chief Complaint: Dizziness and lightheadedness  HPI: Dawn Mercer is a 72 y.o. female with medical history significant for diastolic congestive heart failure, hypothyroidism, COPD with chronic hypoxemia on 2 L nasal cannula at home, GERD, and worsening pancytopenia with recent diagnosis of leukemia to begin treatment at [facility] starting Monday, who presented to the emergency department with lightheadedness and dizziness that began upon waking up today.  She states that she did not feel as though the room was spinning, but felt very uneasy.  She denies any visual change, headache, weakness or numbness.  She was at her oncologist's office yesterday and did not have any such symptoms yesterday, nor has she had symptoms similar to these previously. She denies any recent upper respiratory infection, cough, congestion, fever, or chills. She reports good oral intake and denies any nausea, vomiting, or diarrhea. She denies any trouble swallowing or speech deficit or facial droop.   ED Course: Vital signs are noted to be stable and patient will not fully cooperate with the orthostatic vitals due to her severe symptomatology.  She is noted to be pancytopenic on her lab work and does carry a recent diagnosis of leukemia.  She has been started on a fluid bolus and given some Zofran in the ED and there has been no change in her symptomatology.  CT scan of her head is unrevealing and there are no acute findings.  Review of Systems: All others reviewed and otherwise negative.  Past Medical History:  Diagnosis Date  . Anxiety   . Bronchitis    Hx: of  . 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 Chest congestion 04/11/2016  . Claustrophobia   . COPD (chronic obstructive pulmonary disease) ([place])   . GERD (gastroesophageal reflux  disease)   . H/O hiatal hernia   . Hay fever    Hx: of  . History of radiation therapy 01/28/13-03/23/13   63 gray to right upper lobe  . Hypothyroidism   . IBS (irritable bowel syndrome)    Hx: of  . Leukemia ([place])   . Lung cancer, upper lobe ([place]) dx'd 12/2012   chemo and XRt complete  . 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 Pneumonia    years ago  . Seizures ([place])    Hx: of over 40 years ago  . Shortness of breath    Hx: of with exertion  . Status post radiation therapy 10/1999  . 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 CHOLECYSTECTOMY N/A 08/03/2013   Procedure: LAPAROSCOPIC CHOLECYSTECTOMY;  Surgeon: Harl Bowie, MD;  Location: [facility];  Service: General;  Laterality: N/A;  . COLONOSCOPY W/ BIOPSIES AND POLYPECTOMY     Hx: of  . IR GENERIC HISTORICAL  04/11/2016   IR REMOVAL TUN ACCESS W/ PORT W/O FL MOD SED 04/11/2016 Arne Cleveland, MD [facility]-INTERV RAD  . KYPHOPLASTY N/A 12/06/2015   Procedure: THORACIC 5 KYPHOPLASTY;  Surgeon: Melina Schools, MD;  Location: [facility];  Service: Orthopedics;  Laterality: N/A;  . LASER ABLATION  04/2004   for VIN III  . PORTACATH PLACEMENT    . TUBAL LIGATION    . VIDEO BRONCHOSCOPY Bilateral 02/14/2017   Procedure: VIDEO BRONCHOSCOPY WITH FLUORO;  Surgeon: Lake Bells,  Ronie Spies, MD;  Location: [facility];  Service: Cardiopulmonary;  Laterality: Bilateral;  . VIDEO BRONCHOSCOPY WITH ENDOBRONCHIAL NAVIGATION N/A 12/31/2012   Procedure: VIDEO BRONCHOSCOPY WITH ENDOBRONCHIAL NAVIGATION;  Surgeon: Melrose Nakayama, MD;  Location: [place];  Service: Thoracic;  Laterality: N/A;  . VIDEO BRONCHOSCOPY WITH ENDOBRONCHIAL ULTRASOUND N/A 12/31/2012   Procedure: VIDEO BRONCHOSCOPY WITH ENDOBRONCHIAL ULTRASOUND;  Surgeon: Melrose Nakayama, MD;  Location: [facility];  Service: Thoracic;  Laterality: N/A;     reports that she quit smoking about 19 years ago. She has a 15.00  pack-year smoking history. she has never used smokeless tobacco. She reports that she does not drink alcohol or use drugs.  Allergies  Allergen Reactions  . Latex Dermatitis  . Codeine Nausea Only    Family History  Problem Relation Age of Onset  . Lung cancer Brother   . Heart disease Father   . Cervical cancer Sister   . Heart disease Brother   . Cancer Brother        small cell lung cancer    Prior to Admission medications   Medication Sig Start Date End Date Taking? Authorizing Provider  albuterol (PROVENTIL HFA;VENTOLIN HFA) 108 (90 Base) MCG/ACT inhaler Inhale 2 puffs into the lungs every 6 (six) hours as needed for wheezing or shortness of breath.   Yes [provider]  albuterol (PROVENTIL) (2.5 MG/3ML) 0.083% nebulizer solution Take 3 mLs (2.5 mg total) by nebulization every 6 (six) hours as needed for wheezing or shortness of breath. 03/11/17  Yes Juanito Doom, MD  ALPRAZolam [name]) 0.5 MG tablet Take 0.5 tablets (0.25 mg total) by mouth 2 (two) times daily as needed for anxiety. 04/09/17  Yes Owens Shark, NP  aspirin EC 325 MG tablet Take 325 mg by mouth daily as needed for mild pain or moderate pain.   Yes [provider]  aspirin EC 81 MG tablet Take 81 mg by mouth daily.   Yes [provider]  Cholecalciferol (VITAMIN D3) 1000 units CAPS Take 1,000 Units by mouth daily.   Yes [provider]  cyanocobalamin (,VITAMIN B-12,) 1000 MCG/ML injection Inject 1,000 mcg into the muscle every 30 (thirty) days.   Yes [provider]  furosemide (LASIX) 40 MG tablet Take 1 tablet (40 mg total) by mouth daily. 03/25/17  Yes Oretha Milch D, MD  Glycopyrrolate-Formoterol (BEVESPI AEROSPHERE) 9-4.8 MCG/ACT AERO Inhale 2 Inhalers into the lungs 2 (two) times daily. Inhale 2 puffs daily, rinse mouth after use. 03/12/17  Yes Juanito Doom, MD  guaiFENesin (MUCINEX) 600 MG 12 hr tablet Take 1 tablet (600 mg total) by mouth 2 (two)  times daily as needed for to loosen phlegm. 03/24/17  Yes Oretha Milch D, MD  iron polysaccharides (NIFEREX) 150 MG capsule Take 150 mg daily by mouth.   Yes [provider]  levothyroxine (SYNTHROID, LEVOTHROID) 125 MCG tablet Take 62.5-125 mcg by mouth See admin instructions. Take 125 mcg daily except Sundays take 62.5 mcg   Yes [provider]  OXYGEN 2lpm with rest and 3lpm with exertion    [provider]    Physical Exam: Vitals:   04/29/17 2139 04/29/17 2215 04/29/17 2224 04/29/17 2313  BP:  102/63  109/72  Pulse:    (!) 102  Resp:  19  (!) 25  Temp:   97.9 F (36.6 C)   TempSrc:   Oral   SpO2:    97%  Weight: 78.5 kg (173 lb)  Height: 5\' 5"  (1.651 m)       Constitutional: NAD, calm, comfortable Vitals:   04/29/17 2139 04/29/17 2215 04/29/17 2224 04/29/17 2313  BP:  102/63  109/72  Pulse:    (!) 102  Resp:  19  (!) 25  Temp:   97.9 F (36.6 C)   TempSrc:   Oral   SpO2:    97%  Weight: 78.5 kg (173 lb)     Height: 5\' 5"  (1.651 m)      Eyes: lids and conjunctivae normal ENMT: Mucous membranes are moist.  Neck: normal, supple Respiratory: clear to auscultation bilaterally. Normal respiratory effort. No accessory muscle use. On 2L [place]. Cardiovascular: Regular rate and rhythm, no murmurs. No extremity edema. Abdomen: no tenderness, no distention. Bowel sounds positive.  Musculoskeletal:  No joint deformity upper and lower extremities.   Skin: no rashes, lesions, ulcers.  Psychiatric: Normal judgment and insight. Alert and oriented x 3. Normal mood.  Will not cooperate fully with a neurological exam as she is quite symptomatic.  Labs on Admission: I have personally reviewed following labs and imaging studies  CBC: Recent Labs  Lab 04/24/17 1309 04/29/17 2204 04/29/17 2240  WBC 0.7* 0.5*  --   NEUTROABS 0.2* 0.1*  --   HGB  --  9.2* 8.8*  HCT 32.1* 27.3* 26.0*  MCV 90.8 91.6  --   PLT 97* 76*  --    Basic Metabolic  Panel: Recent Labs  Lab 04/24/17 1309 04/29/17 2204 04/29/17 2240  NA 140 135 137  K 3.6 3.9 3.4*  CL 98 96* 95*  CO2 28 23  --   GLUCOSE 117 107* 105*  BUN 17 20 18   CREATININE 0.94 0.83 0.90  CALCIUM 9.6 8.5*  --    GFR: Estimated Creatinine Clearance: 60.2 mL/min (by C-G formula based on SCr of 0.9 mg/dL). Liver Function Tests: Recent Labs  Lab 04/24/17 1309 04/29/17 2204  AST 17 26  ALT 18 18  ALKPHOS 91 75  BILITOT 1.7* 2.0*  PROT 7.5 7.2  ALBUMIN 3.2* 3.4*   No results for input(s): LIPASE, AMYLASE in the last 168 hours. No results for input(s): AMMONIA in the last 168 hours. Coagulation Profile: No results for input(s): INR, PROTIME in the last 168 hours. Cardiac Enzymes: No results for input(s): CKTOTAL, CKMB, CKMBINDEX, TROPONINI in the last 168 hours. BNP (last 3 results) No results for input(s): PROBNP in the last 8760 hours. HbA1C: No results for input(s): HGBA1C in the last 72 hours. CBG: Recent Labs  Lab 04/29/17 2218  GLUCAP 106*   Lipid Profile: No results for input(s): CHOL, HDL, LDLCALC, TRIG, CHOLHDL, LDLDIRECT in the last 72 hours. Thyroid Function Tests: No results for input(s): TSH, T4TOTAL, FREET4, T3FREE, THYROIDAB in the last 72 hours. Anemia Panel: No results for input(s): VITAMINB12, FOLATE, FERRITIN, TIBC, IRON, RETICCTPCT in the last 72 hours. Urine analysis:    Component Value Date/Time   COLORURINE YELLOW 04/29/2017 2300   APPEARANCEUR HAZY (A) 04/29/2017 2300   LABSPEC 1.017 04/29/2017 2300   PHURINE 6.0 04/29/2017 2300   GLUCOSEU NEGATIVE 04/29/2017 2300   HGBUR NEGATIVE 04/29/2017 2300   BILIRUBINUR NEGATIVE 04/29/2017 2300   KETONESUR NEGATIVE 04/29/2017 2300   PROTEINUR NEGATIVE 04/29/2017 2300   NITRITE NEGATIVE 04/29/2017 2300   LEUKOCYTESUR NEGATIVE 04/29/2017 2300    Radiological Exams on Admission: Ct Head Wo Contrast  Result Date: 04/29/2017 CLINICAL DATA:  Dizziness and weakness upon waking this morning.  EXAM: CT HEAD WITHOUT CONTRAST  TECHNIQUE: Contiguous axial images were obtained from the base of the skull through the vertex without intravenous contrast. COMPARISON:  06/20/2016 FINDINGS: Brain: Mild superficial atrophy with chronic minimal small vessel ischemic disease. No acute intracranial hemorrhage, midline shift or edema. No hydrocephalus. Midline fourth ventricle and basal cisterns. No intra-axial mass nor extra-axial fluid collections. Vascular: No hyperdense vessels or unexpected calcifications. Atherosclerosis of the carotid siphons and left vertebral artery. Skull: Negative for fracture or focal osseous lesions Sinuses/Orbits: Clear mastoids. Mild ethmoid and sphenoid sinus mucosal thickening. Other: None IMPRESSION: Chronic minimal small vessel ischemic disease and superficial atrophy of the brain. No acute intracranial abnormality. Electronically Signed   By: Ashley Royalty M.D.   On: 04/29/2017 23:47    EKG: Independently reviewed. SR 89bpm.  Assessment/Plan Principal Problem:   Dizziness Active Problems:   COPD GOLD II with disporportionate decrease dlco    Chronic respiratory failure with hypoxia (HCC)   Hypothyroidism   Leukemia (HCC)    1. Dizziness with possible vertigo suspicious of cerebellar CVA.  I will started on meclizine for her symptomatology and maintain on Zofran as needed if she does feel nauseous.  Continue IV fluid and reattempt orthostatic vitals in the a.m.  I will go ahead and proceed with MRI/MRA brain as I feel that she may have a possible cerebellar CVA as much of her symptoms do not appear to be positional in nature.  Nor does it appear that she is dehydrated, and therefore, orthostatic.  Monitor on telemetry for now. 2. Chronic diastolic heart failure.  Hold Lasix for now and maintain on gentle, time-limited IV fluid with daily weights and strict I's and O's.  She is able to lay flat and not noted to be in any acute exacerbation. 3. COPD with chronic hypoxemia.   Maintain on 2 L nasal cannula with duo nebs as needed.\ 4. 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  Maintain on neutropenic precautions and continue to monitor CBC.  She is currently afebrile.  Plans for chemotherapy initiation at [facility] on Monday. 5. Mild hypokalemia.  Replete and reevaluate in a.m. 6. Hypothyroidism.  Continue home Synthroid.     DVT prophylaxis: SCDs Code Status: Full Family Communication: Husband at bedside Disposition Plan:Home after full evaluation complete and symptoms improved Consults called:None Admission status: Obs, tele   Ellamae Lybeck Darleen Crocker DO [facility] Pager [phone]  If 7PM-7AM, please contact night-coverage [email or web address] Password [facility]  04/30/2017, 12:32 AM

## 2017-04-30 NOTE — Progress Notes (Signed)
PT Cancellation Note  Patient Details Name: Dawn Mercer MRN: [id number] DOB: [birth year]   Cancelled Treatment:    Reason Eval/Treat Not Completed: Pain limiting ability to participate.  Patient declined therapy secondary to c/o constant dizziness and not feeling well.  Informed patient we will try back later in day if time permits or tomorrow - RN notified.   10:21 AM, 04/30/17 Lonell Grandchild, MPT Physical Therapist with [facility] [phone] office [phone] mobile phone

## 2017-05-01 DIAGNOSIS — J9611 Chronic respiratory failure with hypoxia: Secondary | ICD-10-CM | POA: Diagnosis not present

## 2017-05-01 DIAGNOSIS — R42 Dizziness and giddiness: Secondary | ICD-10-CM | POA: Diagnosis not present

## 2017-05-01 DIAGNOSIS — I5032 Chronic diastolic (congestive) heart failure: Secondary | ICD-10-CM | POA: Diagnosis not present

## 2017-05-01 DIAGNOSIS — E038 Other specified hypothyroidism: Secondary | ICD-10-CM | POA: Diagnosis not present

## 2017-05-01 DIAGNOSIS — C9590 Leukemia, unspecified not having achieved remission: Secondary | ICD-10-CM

## 2017-05-01 DIAGNOSIS — J449 Chronic obstructive pulmonary disease, unspecified: Secondary | ICD-10-CM

## 2017-05-01 MED ORDER — PROCHLORPERAZINE MALEATE 5 MG PO TABS
10.0000 mg | ORAL_TABLET | Freq: Four times a day (QID) | ORAL | Status: DC | PRN
  Administered 2017-05-01: 10 mg via ORAL
  Filled 2017-05-01: qty 2

## 2017-05-01 MED ORDER — PROCHLORPERAZINE EDISYLATE 5 MG/ML IJ SOLN: 10.0000 mg | Freq: Four times a day (QID) | INTRAMUSCULAR | Status: DC | PRN

## 2017-05-01 NOTE — Progress Notes (Signed)
[facility] PROGRESS NOTE  Dawn Mercer XKG:[id number] DOB: 1945-11-05 DOA: 04/29/2017 PCP: Lawerance Cruel, MD  Interim summary and HPI 72 y.o. female with medical history significant for diastolic congestive heart failure, hypothyroidism, COPD with chronic hypoxemia on 2 L nasal cannula at home, GERD, and worsening pancytopenia with recent diagnosis of leukemia to begin treatment at [facility] starting Monday, who presented to the emergency department with lightheadedness and dizziness that began upon waking up today.  She states that she did not feel as though the room was spinning, but felt very uneasy.  She denies any visual change, headache, weakness or numbness.  She was at her oncologist's office yesterday and did not have any such symptoms yesterday, nor has she had symptoms similar to these previously. She denies any recent upper respiratory infection, cough, congestion, fever, or chills.  Assessment/Plan: 1-dizziness with possible vertigo: -admitted to r/o cerebellar stroke  -CT scan and MRI w/o acute intracranial abnormalities  -PT recommending SNF for rehabilitation; but patient currently denying  -meclizine empirically started  -IVF's given and improvement in her symptoms achieved.  2-chronic diastolic HF -compensated -follow daily weights and strict intake and output  -watch sodium intake -no abnormalities on telemetry over 36 hours; will d/c tele  3-COPD with chronic hypoxemia -stable  -continue 2-3L [place] supplementation  -continue home inhaler/neb regimen   4-pancytopenia and leukemia -actively looking to start treatment at [facility] -discussed with Hem/Onc and plan is to transfer patient to [facility] on 3/8 with anticipation of further tune up and hopefully initiation of chemotherapy on 3/11  5-hypokalemia  -repleted  -follow BMET intermittently   6-hypothyroidism -continue synthroid   Code Status: Full code Family Communication: Husband at  bedside Disposition Plan: Discussed with patient hematologist/oncologist at [facility] who is looking to have the patient transferred to their facility tomorrow for further to stop before initiation of chemotherapy for her underlying leukemia on Monday.   Consultants:  None   Procedures:  See below for x-ray reports   Antibiotics:  None  HPI/Subjective: Overall feeling slightly better, no chest pain, no shortness of breath.  Still having intermittent episode of nausea and feeling dizzy.  Objective: Vitals:   04/30/17 2212 05/01/17 0656  BP: 118/71 120/65  Pulse: 87 82  Resp: 20 20  Temp: 98.4 F (36.9 C) 98.4 F (36.9 C)  SpO2: 94% 95%    Intake/Output Summary (Last 24 hours) at 05/01/2017 1737 Last data filed at 04/30/2017 1842 Gross per 24 hour  Intake 240 ml  Output -  Net 240 ml   Filed Weights   04/29/17 2139 04/30/17 0234 05/01/17 0600  Weight: 78.5 kg (173 lb) 80 kg (176 lb 5.9 oz) 81.9 kg (180 lb 8.9 oz)    Exam:   General: Round, no chest pain, no vomiting.  Reports feeling slightly dizzy and is still having nausea.  Wearing chronic 3 L nasal cannula supplementation  Cardiovascular: S1 and S2, no rubs, no gallops, no JVD.  Respiratory: Normal respiratory effort, good air movement bilaterally, no wheezing, no crackles.  Scattered rhonchi and appreciated on exam.  Abdomen: Soft, nontender, nondistended, positive bowel sounds.  Musculoskeletal: Trace edema bilaterally, no cyanosis, no clubbing.  Data Reviewed: Basic Metabolic Panel: Recent Labs  Lab 04/29/17 2204 04/29/17 2240 04/30/17 0625  NA 135 137 138  K 3.9 3.4* 4.6  CL 96* 95* 102  CO2 23  --  26  GLUCOSE 107* 105* 113*  BUN 20 18 19   CREATININE 0.83 0.90  0.87  CALCIUM 8.5*  --  8.0*   Liver Function Tests: Recent Labs  Lab 04/29/17 2204  AST 26  ALT 18  ALKPHOS 75  BILITOT 2.0*  PROT 7.2  ALBUMIN 3.4*   CBC: Recent Labs  Lab 04/29/17 2204 04/29/17 2240  04/30/17 0625  WBC 0.5*  --  0.7*  NEUTROABS 0.1*  --   --   HGB 9.2* 8.8* 9.1*  HCT 27.3* 26.0* 28.1*  MCV 91.6  --  93.7  PLT 76*  --  73*   BNP (last 3 results) Recent Labs    03/19/17 1213  BNP 175.3*   CBG: Recent Labs  Lab 04/29/17 2218  GLUCAP 106*   Studies: Ct Head Wo Contrast  Result Date: 04/29/2017 CLINICAL DATA:  Dizziness and weakness upon waking this morning. EXAM: CT HEAD WITHOUT CONTRAST TECHNIQUE: Contiguous axial images were obtained from the base of the skull through the vertex without intravenous contrast. COMPARISON:  06/20/2016 FINDINGS: Brain: Mild superficial atrophy with chronic minimal small vessel ischemic disease. No acute intracranial hemorrhage, midline shift or edema. No hydrocephalus. Midline fourth ventricle and basal cisterns. No intra-axial mass nor extra-axial fluid collections. Vascular: No hyperdense vessels or unexpected calcifications. Atherosclerosis of the carotid siphons and left vertebral artery. Skull: Negative for fracture or focal osseous lesions Sinuses/Orbits: Clear mastoids. Mild ethmoid and sphenoid sinus mucosal thickening. Other: None IMPRESSION: Chronic minimal small vessel ischemic disease and superficial atrophy of the brain. No acute intracranial abnormality. Electronically Signed   By: Ashley Royalty M.D.   On: 04/29/2017 23:47   Mr Jodene Nam Head Wo Contrast  Result Date: 04/30/2017 CLINICAL DATA:  Vertigo episodic peripheral EXAM: MRA HEAD WITHOUT CONTRAST TECHNIQUE: Angiographic images of the Circle of Willis were obtained using MRA technique without intravenous contrast. COMPARISON:  MRI head 04/30/2017 FINDINGS: Left vertebral artery dominant and widely patent. Hypoplastic distal right vertebral artery which does contribute to the basilar. Small right PICA. Left PICA not visualized. AICA patent bilaterally. Superior cerebellar and posterior cerebral arteries patent bilaterally without stenosis Internal carotid artery widely patent  without stenosis or aneurysm. Anterior and middle cerebral arteries widely patent bilaterally. Negative for cerebral aneurysm. IMPRESSION: Negative Electronically Signed   By: Franchot Gallo M.D.   On: 04/30/2017 09:07   Mr Brain Wo Contrast  Result Date: 04/30/2017 CLINICAL DATA:  Vertigo episodic peripheral.  Dizziness and weakness EXAM: MRI HEAD WITHOUT CONTRAST TECHNIQUE: Multiplanar, multiecho pulse sequences of the brain and surrounding structures were obtained without intravenous contrast. COMPARISON:  CT head 04/29/2017 FINDINGS: Brain: Image quality degraded by mild motion Ventricle size normal. Negative for acute infarct. Mild chronic white matter ischemia. Brainstem and basal ganglia intact. Negative for hemorrhage or mass lesion. No fluid collection or midline shift. Vascular: Normal arterial flow void Skull and upper cervical spine: Negative Sinuses/Orbits: Negative Other: None IMPRESSION: No acute abnormality identified. Mild chronic white matter ischemia. Electronically Signed   By: Franchot Gallo M.D.   On: 04/30/2017 09:04    Scheduled Meds: . aspirin EC  81 mg Oral Daily  . cholecalciferol  1,000 Units Oral Daily  . iron polysaccharides  150 mg Oral Daily  . levothyroxine  125 mcg Oral Once per day on Mon Tue Wed Thu Fri Sat  . [START ON 05/04/2017] levothyroxine  62.5 mcg Oral Once per day on Sun  . meclizine  25 mg Oral TID   Continuous Infusions:  Principal Problem:   Dizziness Active Problems:   COPD GOLD II with disporportionate decrease  dlco    Chronic respiratory failure with hypoxia ([place])   Hypothyroidism   Leukemia ([place])    Time spent: 25 minutes     [facility] Pager [phone]. If 7PM-7AM, please contact night-coverage at [email or web address], password [facility] 05/01/2017, 5:37 PM  LOS: 0 days

## 2017-05-01 NOTE — Evaluation (Signed)
Physical Therapy Evaluation Patient Details Name: Dawn Mercer MRN: [id number] DOB: [birth year] Today's Date: 05/01/2017   History of Present Illness  Dawn Mercer is a 72 y.o. female with medical history significant for diastolic congestive heart failure, hypothyroidism, COPD with chronic hypoxemia on 2 L nasal cannula at home, GERD, and worsening pancytopenia with recent diagnosis of leukemia to begin treatment at [facility] starting Monday, who presented to the emergency department with lightheadedness and dizziness that began upon waking up today.  She states that she did not feel as though the room was spinning, but felt very uneasy.  She denies any visual change, headache, weakness or numbness.  She was at her oncologist's office yesterday and did not have any such symptoms yesterday, nor has she had symptoms similar to these previously.    Clinical Impression  Patient limited to a few steps to transfer to chair due to severe SOB with O2 sats dropping to 85% with exertion while on 3 LPM, required frequent rest breaks and able to tolerate sitting up in chair after therapy with her spouse supervising.  Patient will benefit from continued physical therapy in hospital and recommended venue below to increase strength, balance, endurance for safe ADLs and gait.  Patient suffers from COPD with severe SOB and leukemia  which impairs her ability to perform daily activities like walking, prolonged standing and completing routine ADLs in the home.  A rollator walker alone will not resolve the issues with performing activities of daily living. A wheelchair will allow patient to safely perform daily activities.  The patient can self propel in the home and has a caregiver who can provide assistance.       Follow Up Recommendations SNF;Supervision/Assistance - 24 hour    Equipment Recommendations  Wheelchair (measurements PT);Wheelchair cushion (measurements PT)    Recommendations for Other  Services       Precautions / Restrictions Precautions Precautions: Fall Precaution Comments: O2 dependent Restrictions Weight Bearing Restrictions: No      Mobility  Bed Mobility Overal bed mobility: Needs Assistance Bed Mobility: Supine to Sit     Supine to sit: Min guard        Transfers Overall transfer level: Needs assistance Equipment used: 1 person hand held assist;None Transfers: Sit to/from [facility] Sit to Stand: Mod assist Stand pivot transfers: Mod assist          Ambulation/Gait Ambulation/Gait assistance: Mod assist;Max assist Ambulation Distance (Feet): 3 Feet Assistive device: 1 person hand held assist;None     Gait velocity interpretation: Below normal speed for age/gender General Gait Details: Patient limited to 3-4 side steps due to severe SOB, dizziness and nausea  Stairs            Wheelchair Mobility    Modified Rankin (Stroke Patients Only)       Balance Overall balance assessment: Needs assistance Sitting-balance support: Feet supported;No upper extremity supported Sitting balance-[name] Scale: Fair     Standing balance support: No upper extremity supported;During functional activity Standing balance-[name] Scale: Poor Standing balance comment: hand held assist                             Pertinent Vitals/Pain Pain Assessment: No/denies pain    Home Living Family/patient expects to be discharged to:: Private residence Living Arrangements: Spouse/significant other Available Help at Discharge: Family;Available PRN/intermittently Type of Home: Mobile home Home Access: Stairs to enter Entrance Stairs-Rails: Right Entrance Stairs-Number of  Steps: 4 Home Layout: One level Home Equipment: [place] - 4 wheels;Bedside commode      Prior Function Level of Independence: Independent with assistive device(s)         Comments: Ambulates short household distances with Rollator     Hand  Dominance   Dominant Hand: Right    Extremity/Trunk Assessment   Upper Extremity Assessment Upper Extremity Assessment: Generalized weakness    Lower Extremity Assessment Lower Extremity Assessment: Generalized weakness    Cervical / Trunk Assessment Cervical / Trunk Assessment: Normal  Communication   Communication: No difficulties  Cognition Arousal/Alertness: Awake/alert Behavior During Therapy: WFL for tasks assessed/performed Overall Cognitive Status: Within Functional Limits for tasks assessed                                        General Comments      Exercises     Assessment/Plan    PT Assessment Patient needs continued PT services  PT Problem List Decreased strength;Decreased activity tolerance;Decreased balance;Decreased mobility       PT Treatment Interventions Gait training;Functional mobility training;Therapeutic activities;Therapeutic exercise;Stair training;Patient/family education    PT Goals (Current goals can be found in the Care Plan section)  Acute Rehab PT Goals Patient Stated Goal: return home PT Goal Formulation: With patient/family Time For Goal Achievement: 05/08/17 Potential to Achieve Goals: Good    Frequency Min 4X/week   Barriers to discharge        Co-evaluation               AM-PAC PT "6 Clicks" Daily Activity  Outcome Measure Difficulty turning over in bed (including adjusting bedclothes, sheets and blankets)?: None Difficulty moving from lying on back to sitting on the side of the bed? : A Little Difficulty sitting down on and standing up from a chair with arms (e.g., wheelchair, bedside commode, etc,.)?: A Lot Help needed moving to and from a bed to chair (including a wheelchair)?: A Lot Help needed walking in hospital room?: A Lot Help needed climbing 3-5 steps with a railing? : Total 6 Click Score: 14    End of Session Equipment Utilized During Treatment: Oxygen Activity Tolerance: Patient  limited by fatigue(Patient limited by SOB) Patient left: in chair;with call bell/phone within reach;with family/visitor present Nurse Communication: Mobility status;Other (comment)(RN aware patient left up in chair) PT Visit Diagnosis: Unsteadiness on feet (R26.81);Other abnormalities of gait and mobility (R26.89);Muscle weakness (generalized) (M62.81)    Time: 1140-1210 PT Time Calculation (min) (ACUTE ONLY): 30 min   Charges:   PT Evaluation $PT Eval Moderate Complexity: 1 Mod PT Treatments $Therapeutic Activity: 23-37 mins   PT G Codes:        12:25 PM, May 15, 2017 Lonell Grandchild, MPT Physical Therapist with [facility] [phone] office [phone] mobile phone

## 2017-05-01 NOTE — Plan of Care (Signed)
  Acute Rehab PT Goals(only PT should resolve) Pt Will Go Supine/Side To Sit 05/01/2017 1226 - Progressing by Lonell Grandchild, PT Flowsheets Taken 05/01/2017 1226  Pt will go Supine/Side to Sit with modified independence Patient Will Transfer Sit To/From Stand 05/01/2017 1226 - Progressing by Lonell Grandchild, PT Flowsheets Taken 05/01/2017 1226  Patient will transfer sit to/from stand with supervision Pt Will Transfer Bed To Chair/Chair To Bed 05/01/2017 1226 - Progressing by Lonell Grandchild, PT Flowsheets Taken 05/01/2017 1226  Pt will Transfer Bed to Chair/Chair to Bed with supervision Pt Will Ambulate 05/01/2017 1226 - Progressing by Lonell Grandchild, PT Flowsheets Taken 05/01/2017 1226  Pt will Ambulate with minimal assist;15 feet;with rolling walker  12:26 PM, 05/01/17 Lonell Grandchild, MPT Physical Therapist with [facility] [phone] office [phone] mobile phone

## 2017-05-01 NOTE — Care Management Note (Addendum)
Case Management Note  Patient Details  Name: Dawn Mercer MRN: [id number] Date of Birth: [birth year]  Subjective/Objective:      Adm with dizziness. From home with husband. Husband at bedside. Patient with recent dx of Leukemia, patient reports that she is supposed to go to [facility] to start chemo on Monday and be there for 5 days.  Seen by PT, recommended for SNF.  Patient and husband refuse.  They would like patient to be transferred to [facility] and plan to discuss with attending. Did discuss [facility] PTwith patient in case she is discharged home.  They are agreeable and would like [facility]. She will need a WC.               Action/Plan: Juliann Pulse of [facility] notified and will obtain orders when available and will deliver WC to room.    Expected Discharge Date:  05/01/17               Expected Discharge Plan:  [place]  In-House Referral:  Clinical Social Work  Discharge planning Services  CM Consult  Post Acute Care Choice:    Choice offered to:  Patient, Adult Children  DME Arranged:  Wheelchair DME Agency:   [place] Arranged:  PT, RN [place] Agency:  [place]  Status of Service:  Completed, signed off  If discussed at [place] of Stay Meetings, dates discussed:    Additional Comments:  Ariel Dimitri, Chauncey [place], RN 05/01/2017, 12:32 PM

## 2017-05-02 ENCOUNTER — Ambulatory Visit: Payer: Medicare Other

## 2017-05-02 ENCOUNTER — Other Ambulatory Visit: Payer: Medicare Other

## 2017-05-02 ENCOUNTER — Ambulatory Visit (HOSPITAL_COMMUNITY): Admission: RE | Admit: 2017-05-02 | Payer: Medicare Other | Source: Ambulatory Visit

## 2017-05-02 DIAGNOSIS — J9611 Chronic respiratory failure with hypoxia: Secondary | ICD-10-CM | POA: Diagnosis not present

## 2017-05-02 DIAGNOSIS — E038 Other specified hypothyroidism: Secondary | ICD-10-CM | POA: Diagnosis not present

## 2017-05-02 DIAGNOSIS — J449 Chronic obstructive pulmonary disease, unspecified: Secondary | ICD-10-CM | POA: Diagnosis not present

## 2017-05-02 DIAGNOSIS — R42 Dizziness and giddiness: Secondary | ICD-10-CM | POA: Diagnosis not present

## 2017-05-02 DIAGNOSIS — I5032 Chronic diastolic (congestive) heart failure: Secondary | ICD-10-CM

## 2017-05-02 LAB — GLUCOSE, CAPILLARY: Glucose-Capillary: 89 mg/dL (ref 65–99)

## 2017-05-02 LAB — CBC
HCT: 25.9 % — ABNORMAL LOW (ref 36.0–46.0)
Hemoglobin: 8.3 g/dL — ABNORMAL LOW (ref 12.0–15.0)
MCH: 30.5 pg (ref 26.0–34.0)
MCHC: 32 g/dL (ref 30.0–36.0)
MCV: 95.2 fL (ref 78.0–100.0)
Platelets: 75 10*3/uL — ABNORMAL LOW (ref 150–400)
RBC: 2.72 MIL/uL — AB (ref 3.87–5.11)
RDW: 23.1 % — ABNORMAL HIGH (ref 11.5–15.5)
WBC: 0.9 10*3/uL — AB (ref 4.0–10.5)

## 2017-05-02 LAB — BASIC METABOLIC PANEL
ANION GAP: 11 (ref 5–15)
BUN: 15 mg/dL (ref 6–20)
CHLORIDE: 100 mmol/L — AB (ref 101–111)
CO2: 26 mmol/L (ref 22–32)
Calcium: 8.9 mg/dL (ref 8.9–10.3)
Creatinine, Ser: 0.92 mg/dL (ref 0.44–1.00)
GFR calc Af Amer: >60 mL/min (ref >60)
GFR calc non Af Amer: >60 mL/min (ref >60)
GLUCOSE: 100 mg/dL — AB (ref 65–99)
POTASSIUM: 4.7 mmol/L (ref 3.5–5.1)
Sodium: 137 mmol/L (ref 135–145)

## 2017-05-02 LAB — MAGNESIUM: MAGNESIUM: 1.3 mg/dL — AB (ref 1.7–2.4)

## 2017-05-02 MED ORDER — MECLIZINE HCL 25 MG PO TABS: 25.0000 mg | ORAL_TABLET | Freq: Three times a day (TID) | ORAL | 0 refills | Status: AC

## 2017-05-02 MED ORDER — PROCHLORPERAZINE MALEATE 10 MG PO TABS: 10.0000 mg | ORAL_TABLET | Freq: Four times a day (QID) | ORAL | 0 refills | Status: AC | PRN

## 2017-05-02 MED ORDER — FUROSEMIDE 40 MG PO TABS: 20.0000 mg | ORAL_TABLET | Freq: Every day | ORAL | Status: AC

## 2017-05-02 NOTE — Progress Notes (Signed)
[facility] called with bed assignment. She will be going to the [facility], Room 619.   The number for report is [phone] The charge nurse number for that floor is [phone]

## 2017-05-02 NOTE — Discharge Summary (Signed)
Physician Discharge Summary  Dawn Mercer ZOX:[id number] DOB: 30-Sep-1945 DOA: 04/29/2017  PCP: Lawerance Cruel, MD  Admit date: 04/29/2017 Discharge date: 05/02/2017  Time spent: 35 minutes  Recommendations for Outpatient Follow-up:  1. Repeat basic metabolic panel to follow electrolytes and renal function.  Discharge Diagnoses:  Principal Problem:   Dizziness Active Problems:   COPD GOLD II with disporportionate decrease dlco    Chronic respiratory failure with hypoxia (HCC)   Hypothyroidism   Leukemia (HCC)   Chronic diastolic HF (heart failure) (HCC)   Discharge Condition: Stable and improved.  Patient will be transferred to [facility] for further tune up and anticipation of chemotherapy induction on 05/05/17.  She has been advised to follow-up with her PCP in 2 weeks after being discharged.  Continue the use of empirical meclizine and pursuit vestibular rehabilitation.  Diet recommendation: Heart healthy diet.  Filed Weights   04/30/17 0234 05/01/17 0600 05/02/17 0426  Weight: 80 kg (176 lb 5.9 oz) 81.9 kg (180 lb 8.9 oz) 85.1 kg (187 lb 9.8 oz)    Brief history of present illness:  72 y.o.femalewith medical history significant fordiastolic congestive heart failure, hypothyroidism, COPD with chronic hypoxemia on 2 L nasal cannula at home, GERD, and worsening pancytopenia with recent diagnosis of leukemia to begin treatment at [facility] starting [facility] presented to the emergency department with lightheadedness and dizziness that began upon waking up today. She states that she did not feel as though the room was spinning, but felt very uneasy. She denies any visual change, headache, weakness or numbness. She was at her oncologist's office yesterday and did not have any such symptoms yesterday, nor has she had symptoms similar to these previously. She denies any recent upper respiratory infection, cough, congestion, fever, or chills.   Hospital Course:   1-dizziness with possible vertigo: -Acute intracranial abnormalities and cerebellar stroke has been rule out by CT and MRI. -CT scan and MRI w/o acute intracranial abnormalities  -PT recommending SNF for rehabilitation; but patient currently denying.  Will benefit of vestibular rehabilitation after discharge. -meclizine empirically started  -IVF's given and improvement in her symptoms achieved prior to discharge.  2-chronic diastolic HF -compensated -follow daily weights and strict intake and output  -watch sodium intake -Continue the use of Lasix (dose adjusted to 20 mg daily).  3-COPD with chronic hypoxemia -stable  -continue 2-3L [place] supplementation  -continue home inhaler/neb regimen   4-pancytopenia and leukemia -actively looking to start treatment at [facility] -discussed with Hem/Onc and plan is to transfer patient to [facility] on 3/8 with anticipation of further tune up and hopefully initiation of chemotherapy on 3/11  5-hypokalemia  -repleted and within normal limits at discharge. -Repeat basic metabolic panel to follow electrolytes trend.  6-hypothyroidism -continue synthroid    Procedures:  See below for x-ray reports.  Consultations:  None  Discharge Exam: Vitals:   05/01/17 2011 05/02/17 0426  BP: 105/60 (!) 88/50  Pulse: 88 87  Resp:    Temp: 98.6 F (37 C) 98.2 F (36.8 C)  SpO2: 96% 94%    General:  Afebrile, no chest pain, feeling overall better. Reports feeling slightly dizzy and is still having some intermittent nausea, but no further vomiting.  Wearing chronic 3 L nasal cannula supplementation  Cardiovascular: S1 and S2, no rubs, no gallops, no JVD.  Positive soft systolic ejection murmur.  Respiratory: Normal respiratory effort, good air movement bilaterally, no wheezing, no crackles.  Scattered rhonchi and appreciated on exam.  Abdomen:  Soft, nontender, nondistended, positive bowel sounds.  Musculoskeletal: Trace edema  bilaterally, no cyanosis, no clubbing.   Discharge Instructions   Discharge Instructions    Diet - low sodium heart healthy   Complete by:  As directed    Discharge instructions   Complete by:  As directed    Maintain adequate hydration Continue to use meclizine as instructed Outpatient vestibular rehabilitation recommended Further treatment of and initiation of chemotherapy as per hematologist/oncologist protocol at receiving hospital.     Allergies as of 05/02/2017      Reactions   Latex Dermatitis   Codeine Nausea Only      Medication List    TAKE these medications   albuterol 108 (90 Base) MCG/ACT inhaler Commonly known as:  PROVENTIL HFA;VENTOLIN HFA Inhale 2 puffs into the lungs every 6 (six) hours as needed for wheezing or shortness of breath.   albuterol (2.5 MG/3ML) 0.083% nebulizer solution Commonly known as:  PROVENTIL Take 3 mLs (2.5 mg total) by nebulization every 6 (six) hours as needed for wheezing or shortness of breath.   ALPRAZolam 0.5 MG tablet Commonly known as:  XANAX Take 0.5 tablets (0.25 mg total) by mouth 2 (two) times daily as needed for anxiety.   aspirin EC 81 MG tablet Take 81 mg by mouth daily. What changed:  Another medication with the same name was removed. Continue taking this medication, and follow the directions you see here.   cyanocobalamin 1000 MCG/ML injection Commonly known as:  (VITAMIN B-12) Inject 1,000 mcg into the muscle every 30 (thirty) days.   furosemide 40 MG tablet Commonly known as:  LASIX Take 0.5 tablets (20 mg total) by mouth daily. What changed:  how much to take   Glycopyrrolate-Formoterol 9-4.8 MCG/ACT Aero Commonly known as:  BEVESPI AEROSPHERE Inhale 2 Inhalers into the lungs 2 (two) times daily. Inhale 2 puffs daily, rinse mouth after use.   guaiFENesin 600 MG 12 hr tablet Commonly known as:  MUCINEX Take 1 tablet (600 mg total) by mouth 2 (two) times daily as needed for to loosen phlegm.   iron  polysaccharides 150 MG capsule Commonly known as:  NIFEREX Take 150 mg daily by mouth.   levothyroxine 125 MCG tablet Commonly known as:  SYNTHROID, LEVOTHROID Take 62.5-125 mcg by mouth See admin instructions. Take 125 mcg daily except Sundays take 62.5 mcg   meclizine 25 MG tablet Commonly known as:  ANTIVERT Take 1 tablet (25 mg total) by mouth 3 (three) times daily.   OXYGEN 2lpm with rest and 3lpm with exertion   prochlorperazine 10 MG tablet Commonly known as:  COMPAZINE Take 1 tablet (10 mg total) by mouth every 6 (six) hours as needed for nausea, vomiting or refractory nausea / vomiting.   Vitamin D3 1000 units Caps Take 1,000 Units by mouth daily.            Durable Medical Equipment  (From admission, onward)        Start     Ordered   05/01/17 1241  For home use only DME standard manual wheelchair with seat cushion  Once    Comments:  Patient suffers from weakness/dizziness which impairs their ability to perform daily activities like walking in the home.  A walker will not resolve  issue with performing activities of daily living. A wheelchair will allow patient to safely perform daily activities. Patient can safely propel the wheelchair in the home or has a caregiver who can provide assistance.  Accessories: elevating leg rests (  ELRs), wheel locks, extensions and anti-tippers.   05/01/17 1241     Allergies  Allergen Reactions  . Latex Dermatitis  . Codeine Nausea Only   Follow-up Information    Lawerance Cruel, MD. Schedule an appointment as soon as possible for a visit in 2 week(s).   Specialty:  Family Medicine Contact information: [place] [address] [phone]           The results of significant diagnostics from this hospitalization (including imaging, microbiology, ancillary and laboratory) are listed below for reference.    Significant Diagnostic Studies: Ct Head Wo Contrast  Result Date: 04/29/2017 CLINICAL  DATA:  Dizziness and weakness upon waking this morning. EXAM: CT HEAD WITHOUT CONTRAST TECHNIQUE: Contiguous axial images were obtained from the base of the skull through the vertex without intravenous contrast. COMPARISON:  06/20/2016 FINDINGS: Brain: Mild superficial atrophy with chronic minimal small vessel ischemic disease. No acute intracranial hemorrhage, midline shift or edema. No hydrocephalus. Midline fourth ventricle and basal cisterns. No intra-axial mass nor extra-axial fluid collections. Vascular: No hyperdense vessels or unexpected calcifications. Atherosclerosis of the carotid siphons and left vertebral artery. Skull: Negative for fracture or focal osseous lesions Sinuses/Orbits: Clear mastoids. Mild ethmoid and sphenoid sinus mucosal thickening. Other: None IMPRESSION: Chronic minimal small vessel ischemic disease and superficial atrophy of the brain. No acute intracranial abnormality. Electronically Signed   By: Ashley Royalty M.D.   On: 04/29/2017 23:47   Mr Jodene Nam Head Wo Contrast  Result Date: 04/30/2017 CLINICAL DATA:  Vertigo episodic peripheral EXAM: MRA HEAD WITHOUT CONTRAST TECHNIQUE: Angiographic images of the Circle of Willis were obtained using MRA technique without intravenous contrast. COMPARISON:  MRI head 04/30/2017 FINDINGS: Left vertebral artery dominant and widely patent. Hypoplastic distal right vertebral artery which does contribute to the basilar. Small right PICA. Left PICA not visualized. AICA patent bilaterally. Superior cerebellar and posterior cerebral arteries patent bilaterally without stenosis Internal carotid artery widely patent without stenosis or aneurysm. Anterior and middle cerebral arteries widely patent bilaterally. Negative for cerebral aneurysm. IMPRESSION: Negative Electronically Signed   By: Franchot Gallo M.D.   On: 04/30/2017 09:07   Mr Brain Wo Contrast  Result Date: 04/30/2017 CLINICAL DATA:  Vertigo episodic peripheral.  Dizziness and weakness EXAM: MRI  HEAD WITHOUT CONTRAST TECHNIQUE: Multiplanar, multiecho pulse sequences of the brain and surrounding structures were obtained without intravenous contrast. COMPARISON:  CT head 04/29/2017 FINDINGS: Brain: Image quality degraded by mild motion Ventricle size normal. Negative for acute infarct. Mild chronic white matter ischemia. Brainstem and basal ganglia intact. Negative for hemorrhage or mass lesion. No fluid collection or midline shift. Vascular: Normal arterial flow void Skull and upper cervical spine: Negative Sinuses/Orbits: Negative Other: None IMPRESSION: No acute abnormality identified. Mild chronic white matter ischemia. Electronically Signed   By: Franchot Gallo M.D.   On: 04/30/2017 09:04   Labs: Basic Metabolic Panel: Recent Labs  Lab 04/29/17 2204 04/29/17 2240 04/30/17 0625 05/02/17 0618  NA 135 137 138 137  K 3.9 3.4* 4.6 4.7  CL 96* 95* 102 100*  CO2 23  --  26 26  GLUCOSE 107* 105* 113* 100*  BUN 20 18 19 15   CREATININE 0.83 0.90 0.87 0.92  CALCIUM 8.5*  --  8.0* 8.9  MG  --   --   --  1.3*   Liver Function Tests: Recent Labs  Lab 04/29/17 2204  AST 26  ALT 18  ALKPHOS 75  BILITOT 2.0*  PROT 7.2  ALBUMIN 3.4*   CBC: Recent Labs  Lab 04/29/17 2204 04/29/17 2240 04/30/17 0625 05/02/17 0618  WBC 0.5*  --  0.7* 0.9*  NEUTROABS 0.1*  --   --   --   HGB 9.2* 8.8* 9.1* 8.3*  HCT 27.3* 26.0* 28.1* 25.9*  MCV 91.6  --  93.7 95.2  PLT 76*  --  73* 75*    BNP (last 3 results) Recent Labs    03/19/17 1213  BNP 175.3*    CBG: Recent Labs  Lab 04/29/17 2218 05/02/17 0803  GLUCAP 106* 89    Signed:  Barton Dubois MD.  [facility] 05/02/2017, 11:39 AM

## 2017-05-05 ENCOUNTER — Ambulatory Visit: Payer: Medicare Other | Admitting: Internal Medicine

## 2017-05-06 LAB — CHROMOSOME ANALYSIS, BONE MARROW

## 2017-05-07 ENCOUNTER — Other Ambulatory Visit: Payer: Medicare Other

## 2017-05-09 ENCOUNTER — Ambulatory Visit: Payer: Medicare Other | Admitting: Cardiovascular Disease

## 2017-05-09 DIAGNOSIS — R0989 Other specified symptoms and signs involving the circulatory and respiratory systems: Secondary | ICD-10-CM

## 2017-05-14 ENCOUNTER — Other Ambulatory Visit: Payer: Medicare Other

## 2017-05-21 ENCOUNTER — Ambulatory Visit: Payer: Medicare Other | Admitting: Internal Medicine

## 2017-05-21 ENCOUNTER — Other Ambulatory Visit: Payer: Medicare Other

## 2017-05-21 ENCOUNTER — Ambulatory Visit: Payer: Medicare Other

## 2017-05-22 ENCOUNTER — Ambulatory Visit: Payer: Medicare Other | Admitting: Pulmonary Disease

## 2017-05-23 ENCOUNTER — Ambulatory Visit: Payer: Medicare Other

## 2017-05-26 DEATH — deceased

## 2017-05-30 ENCOUNTER — Telehealth: Payer: Self-pay | Admitting: Pulmonary Disease

## 2017-05-30 NOTE — Telephone Encounter (Signed)
Attempted to contact pt's husband. His voicemail was full and I could not leave a message. Will try back.

## 2017-06-02 NOTE — Telephone Encounter (Signed)
Attempted to call Dawn Mercer, no answer, message left to call back.

## 2017-06-03 NOTE — Telephone Encounter (Signed)
I called [employer] and they stated they have been trying to contact him but no success. Dawn Mercer stated he has looked in the system and a pickup is scheduled for 06/09/17. Nothing further is needed and will close this encounter.

## 2019-09-07 IMAGING — CT CT ANGIO CHEST
2 of 6 series · 17 of 36 positions shown · IV contrast (ISOVUE)
Comparison: 01/12/2017

CLINICAL DATA: Dyspnea worsening since [DATE] a.m. patient has a
known lung mass in the right upper lobe and was supposed to start
chemotherapy today.

EXAM:
CT ANGIOGRAPHY CHEST WITH CONTRAST
TECHNIQUE: Multidetector CT imaging of the chest was performed using the
standard protocol during bolus administration of intravenous
contrast. Multiplanar CT image reconstructions and MIPs were
obtained to evaluate the vascular anatomy.
CONTRAST:  100mL [id number] IOPAMIDOL ([id number]) INJECTION 76%

[Series 5: thins · axial · 0.62mm/px · z∈[-262,-14]mm · 16 of 276 slices shown]
[im 14/276  lung]
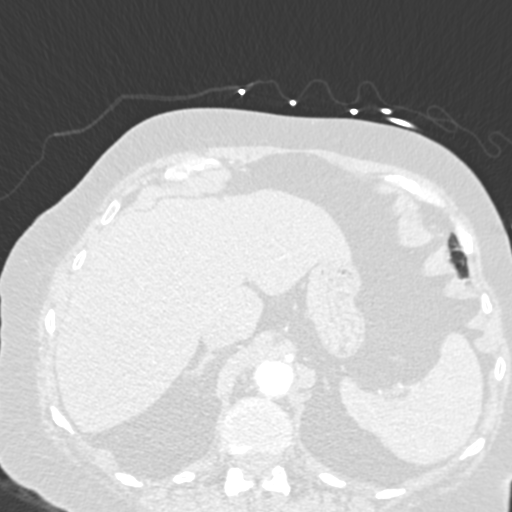
[im 28/276  mediastinal]
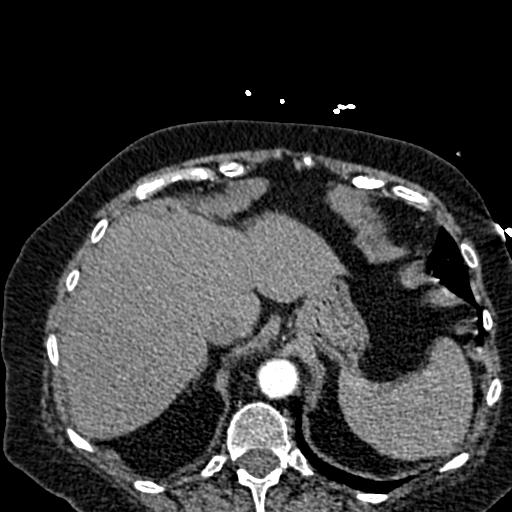
[im 42/276  lung]
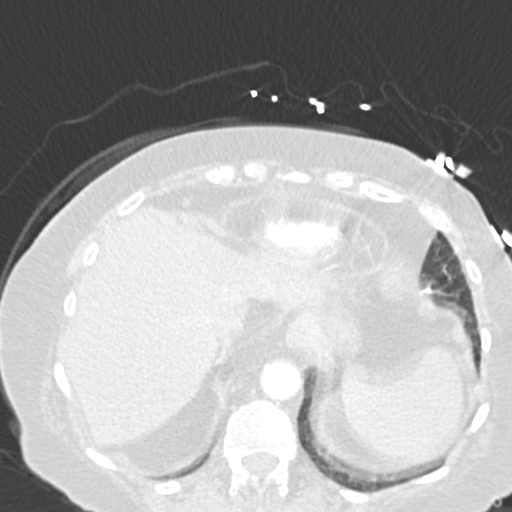
[im 69/276  mediastinal]
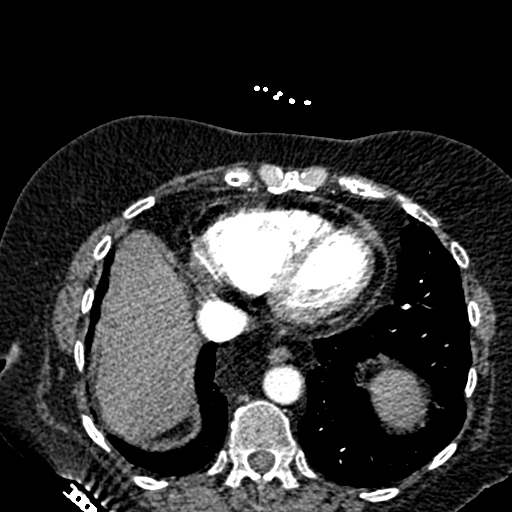
[im 83/276  lung]
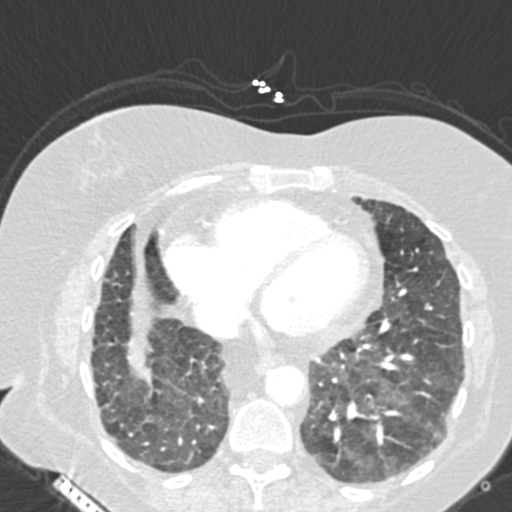
[im 97/276  mediastinal]
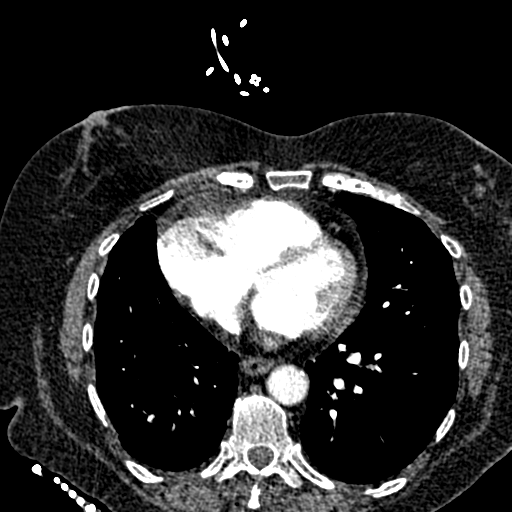
[im 111/276  lung]
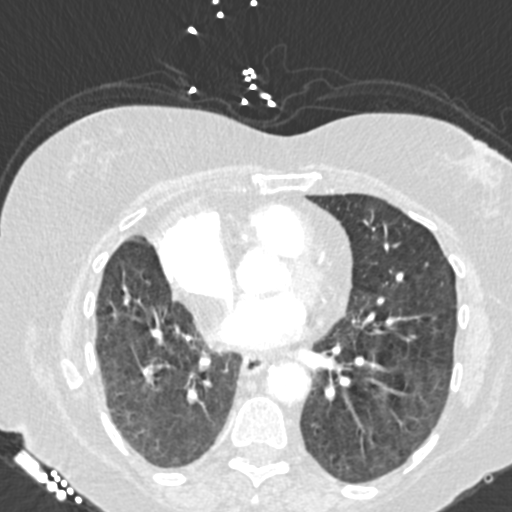
[im 124/276  mediastinal]
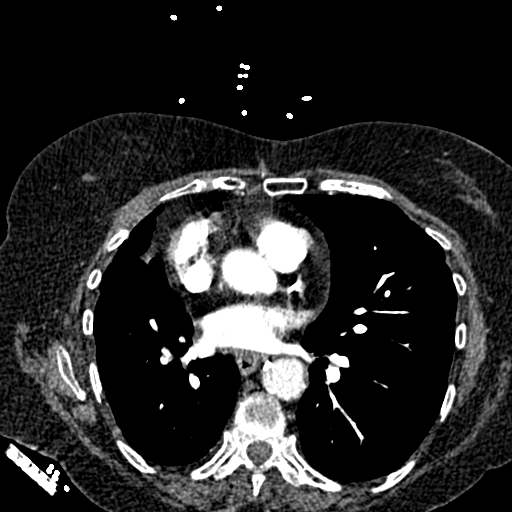
[im 152/276  lung]
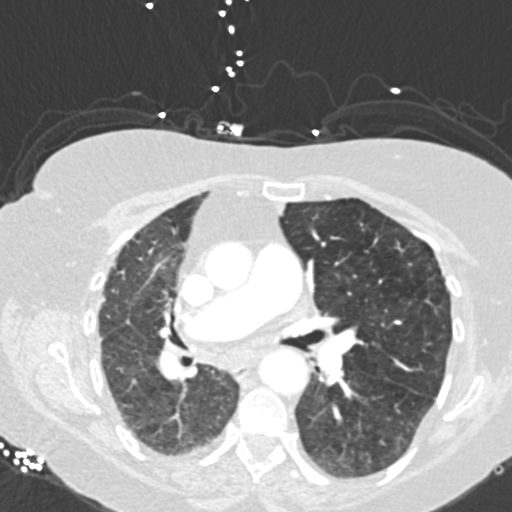
[im 166/276  mediastinal]
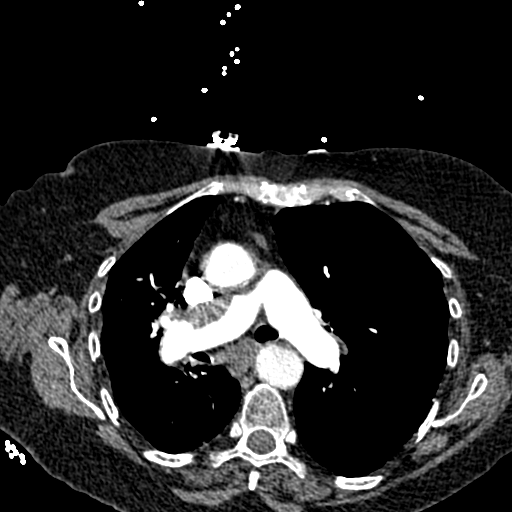
[im 179/276  lung]
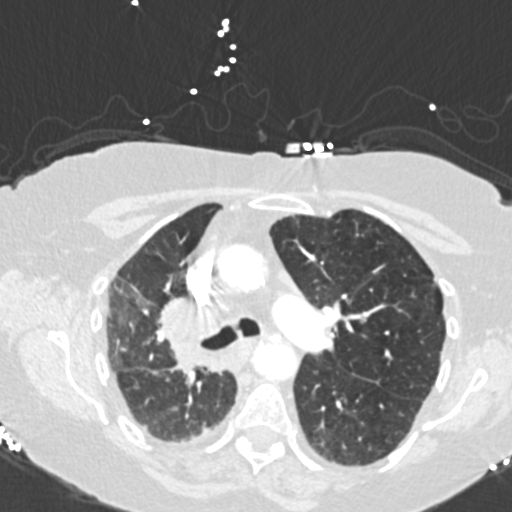
[im 193/276  mediastinal]
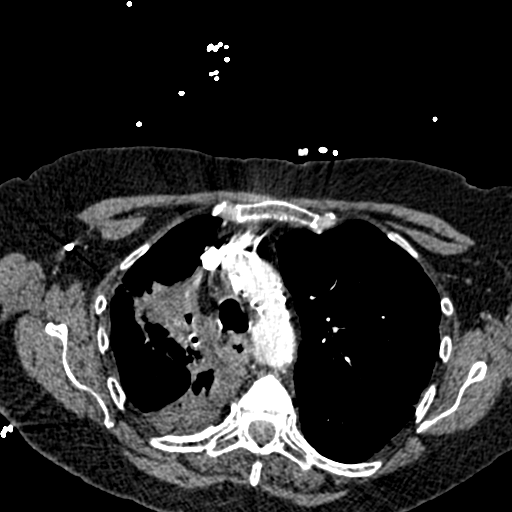
[im 207/276  lung]
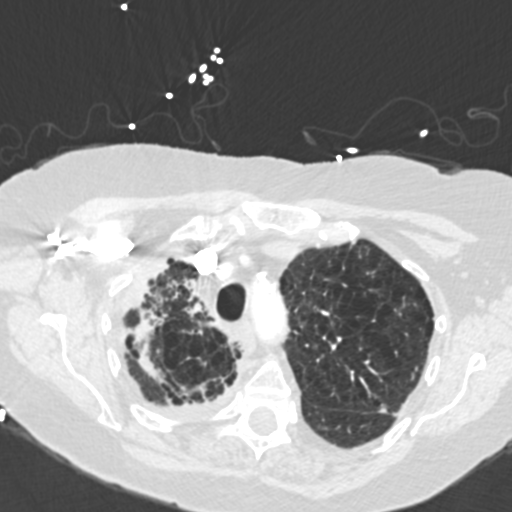
[im 234/276  mediastinal]
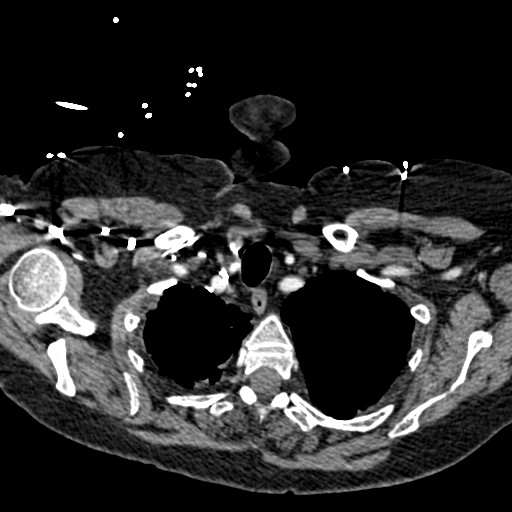
[im 248/276  lung]
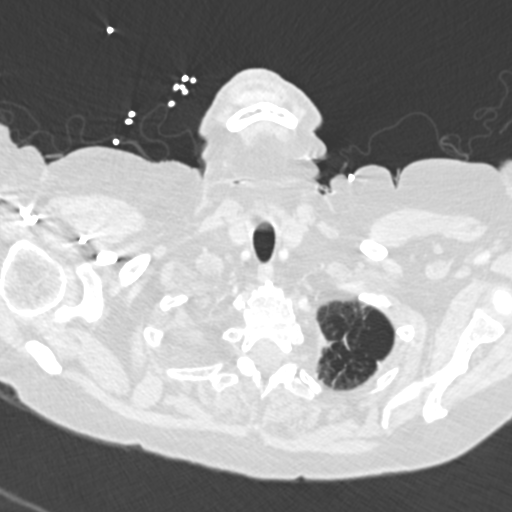
[im 262/276  mediastinal]
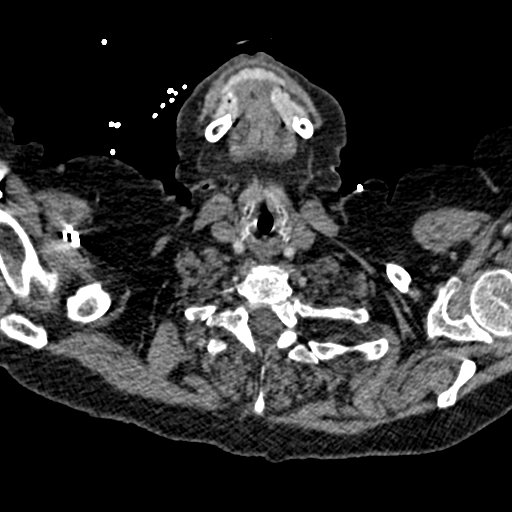

[Series 7: coronal mpr · coronal · 0.57mm/px · 1 of 127 slices shown]
[im 64/127  mediastinal]
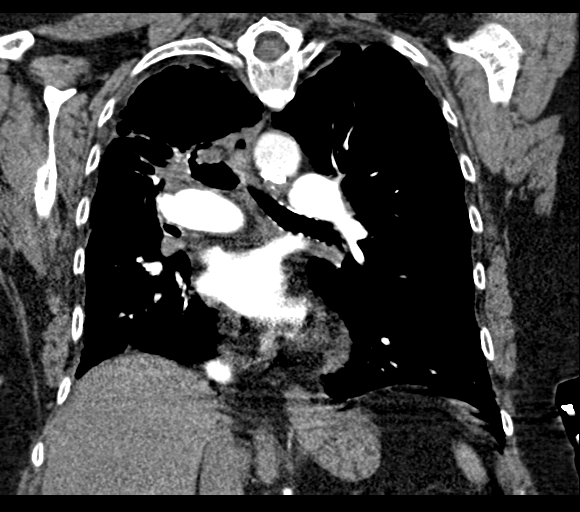

[17 of 36 positions shown; findings below may reference images not displayed]

FINDINGS: Cardiovascular: Normal 3 vessel branch pattern of the great vessels
with atherosclerosis noted proximally. Aortic atherosclerosis is
also noted without aneurysm or dissection. Good opacification of the
pulmonary arteries. Mild dilatation of the main pulmonary artery to
3.2 cm compatible with changes of chronic pulmonary hypertension. No
evidence of acute pulmonary embolus. Mild cardiomegaly without
pericardial effusion. Coronary arteriosclerosis. Slight
dextroversion of the heart due to volume loss on the right. There
remains slight compression of the main right pulmonary artery
secondary to post radiative change as well as obliteration of the
right upper lobe pulmonary artery.

Mediastinum/Nodes: No adenopathy at the base of the neck. No
supraclavicular nor axillary lymphadenopathy. No thyromegaly or
mass. There remains soft tissue density emanating from the right
hilum superiorly impressing upon the adjacent right pulmonary artery
and tracheobronchial tree. This measures approximately 3.2 x 2.4 cm
versus 3 x 2.3 cm previously and is without significant change.
Stable right infrahilar and small subcarinal lymph nodes as before.
The largest is subcarinal approximately 1.2 cm short axis.

Lungs/Pleura: Stable centrilobular and paraseptal emphysematous
change with volume loss on the right consistent with post treatment
change and radiation fibrosis. Superimposed on emphysema are faint
diffuse ground-glass opacities within both lungs that may represent
a component of alveolitis or pneumonitis versus stigmata of mild
pulmonary edema. No new focal infiltrate nor pulmonary nodules.

Upper Abdomen: The included upper abdomen is nonacute.

Musculoskeletal: Degenerative changes of the thoracic spine without
acute compression deformity. Stable kyphoplasty at T5 with chronic
stable superior endplate compression of T6. No aggressive osteolytic
or blastic lesions.

Review of the MIP images confirms the above findings.
IMPRESSION: 1. No acute pulmonary embolus. Redemonstration of occluded right
upper lobe pulmonary arterial branch likely secondary to post
treatment change and fibrosis.
2. Stable masslike opacity about the right hilum measuring
approximately 3.2 x 2.4 cm versus 3 x 2.3 cm previously, slightly
impressing upon the adjacent right pulmonary artery and
tracheobronchial tree.
3. Stable small right infrahilar and subcarinal lymph nodes.
4. Faint ground-glass opacities of the lungs superimposed on
emphysema, nonspecific but may represent stigmata of mild pulmonary
edema, alveolitis or pneumonitis, or an atypical infection among
some possibilities.
5. Coronary arteriosclerosis.
6. T5 kyphoplasty with chronic superior endplate compression of T6.
No suspicious osseous lesions.

Aortic Atherosclerosis (YKOUR-UYA.A) and Emphysema ([id number].Q).

## 2019-09-07 IMAGING — DX DG CHEST 1V PORT
1 series · 1 of 1 positions shown · non-contrast
Comparison: CT chest 01/12/2017

CLINICAL DATA: History lung cancer.  Decreased oxygen saturation

EXAM:
PORTABLE CHEST 1 VIEW

[chest ap]
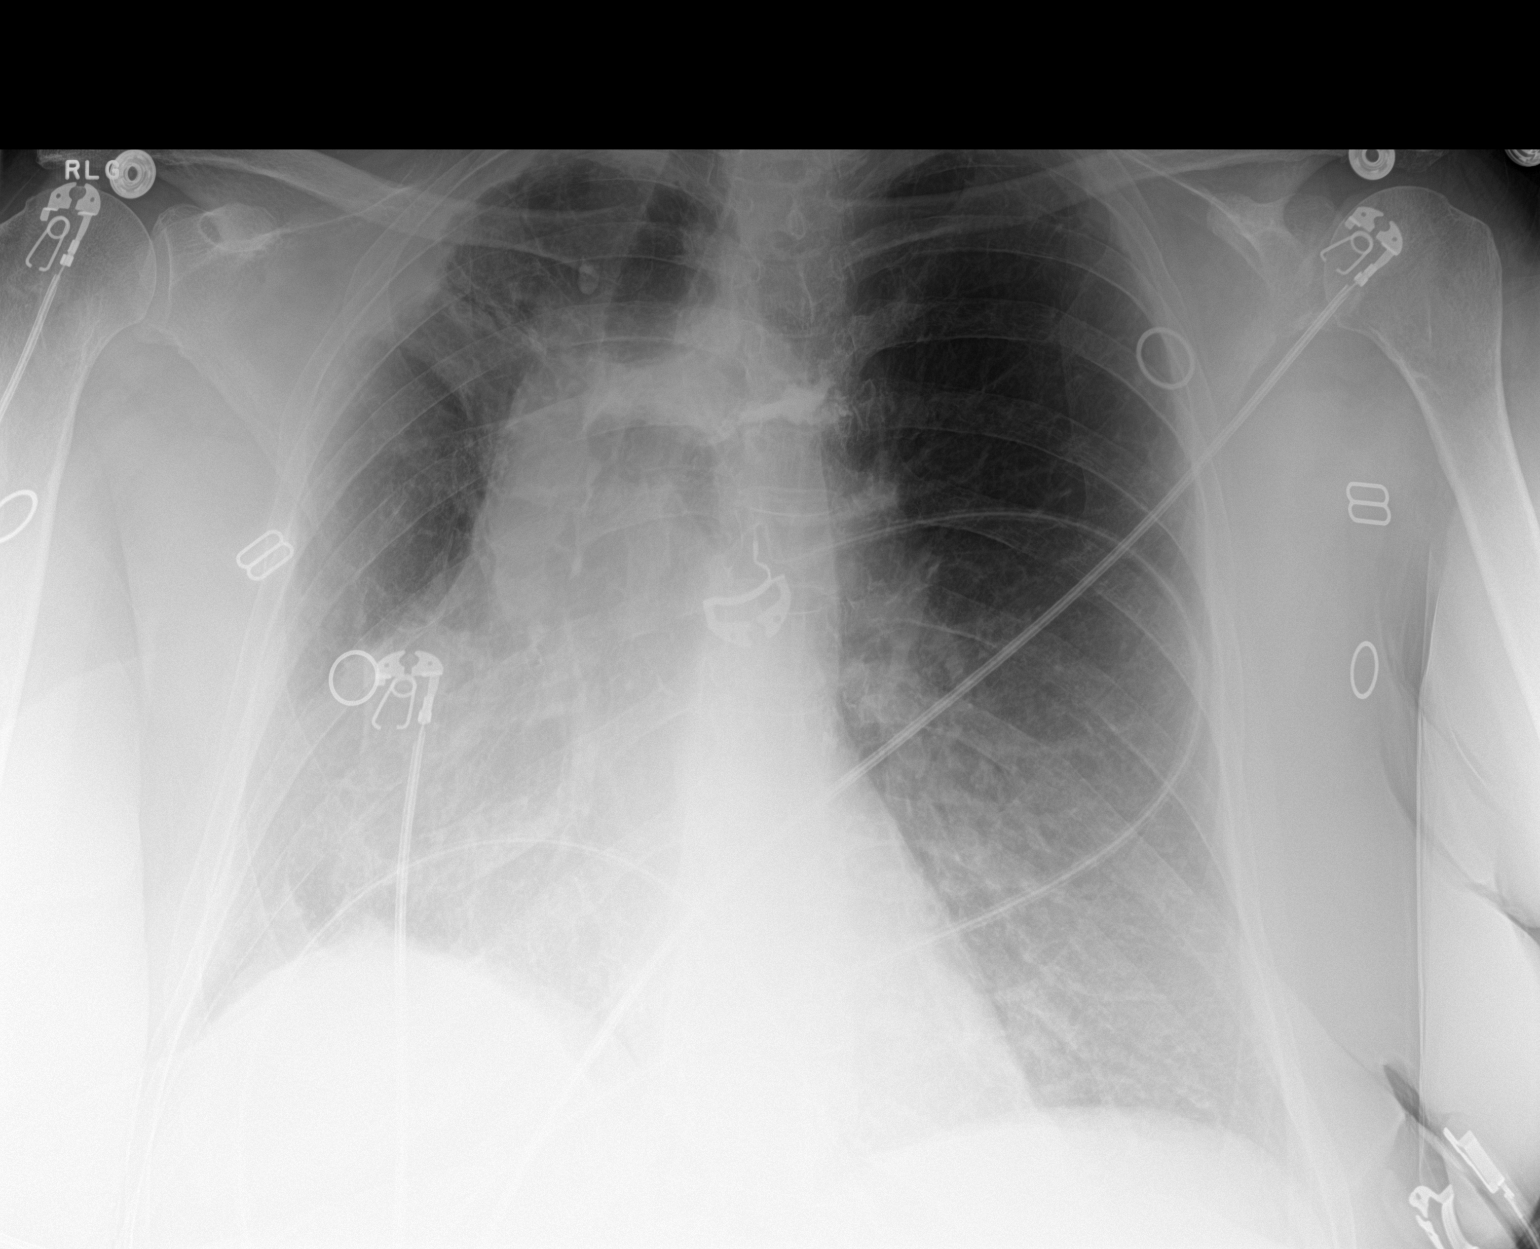

[1 of 1 positions shown; findings below may reference images not displayed]

FINDINGS: Stable right perihilar and upper lobe posttreatment changes. Right
lung volume loss. Underlying COPD. No pleural effusion or
pneumothorax. No focal consolidation. Stable cardiomediastinal
silhouette. No acute osseous abnormality. Prior upper-mid thoracic
vertebral body augmentation.
IMPRESSION: No acute cardiopulmonary disease.

Stable right perihilar and upper lobe posttreatment changes.
